# Patient Record
Sex: Female | Born: 1940
Health system: Southern US, Community
[De-identification: ages and names within clinical notes are randomized; demographics above are authoritative.]

## PROBLEM LIST (undated history)

## (undated) DIAGNOSIS — E785 Hyperlipidemia, unspecified: Secondary | ICD-10-CM

## (undated) DIAGNOSIS — J8410 Pulmonary fibrosis, unspecified: Secondary | ICD-10-CM

## (undated) DIAGNOSIS — C3490 Malignant neoplasm of unspecified part of unspecified bronchus or lung: Secondary | ICD-10-CM

## (undated) DIAGNOSIS — F32A Depression, unspecified: Secondary | ICD-10-CM

## (undated) DIAGNOSIS — F419 Anxiety disorder, unspecified: Secondary | ICD-10-CM

## (undated) DIAGNOSIS — R911 Solitary pulmonary nodule: Secondary | ICD-10-CM

## (undated) DIAGNOSIS — R519 Headache, unspecified: Secondary | ICD-10-CM

## (undated) DIAGNOSIS — J302 Other seasonal allergic rhinitis: Secondary | ICD-10-CM

## (undated) DIAGNOSIS — M1990 Unspecified osteoarthritis, unspecified site: Secondary | ICD-10-CM

## (undated) DIAGNOSIS — F329 Major depressive disorder, single episode, unspecified: Secondary | ICD-10-CM

## (undated) DIAGNOSIS — M545 Low back pain: Secondary | ICD-10-CM

## (undated) DIAGNOSIS — G8929 Other chronic pain: Secondary | ICD-10-CM

## (undated) DIAGNOSIS — Z8709 Personal history of other diseases of the respiratory system: Secondary | ICD-10-CM

## (undated) DIAGNOSIS — R51 Headache: Secondary | ICD-10-CM

## (undated) DIAGNOSIS — I10 Essential (primary) hypertension: Secondary | ICD-10-CM

## (undated) DIAGNOSIS — T8859XA Other complications of anesthesia, initial encounter: Secondary | ICD-10-CM

## (undated) DIAGNOSIS — T4145XA Adverse effect of unspecified anesthetic, initial encounter: Secondary | ICD-10-CM

## (undated) HISTORY — PX: JOINT REPLACEMENT: SHX530

## (undated) HISTORY — PX: BACK SURGERY: SHX140

## (undated) HISTORY — PX: EYE SURGERY: SHX253

## (undated) HISTORY — PX: TUBAL LIGATION: SHX77

## (undated) HISTORY — PX: CATARACT EXTRACTION W/ INTRAOCULAR LENS  IMPLANT, BILATERAL: SHX1307

## (undated) HISTORY — PX: TONSILLECTOMY: SUR1361

---

## 1998-09-03 ENCOUNTER — Other Ambulatory Visit: Admission: RE | Admit: 1998-09-03 | Discharge: 1998-09-03 | Payer: Self-pay | Admitting: *Deleted

## 2001-05-24 ENCOUNTER — Other Ambulatory Visit: Admission: RE | Admit: 2001-05-24 | Discharge: 2001-05-24 | Payer: Self-pay | Admitting: *Deleted

## 2001-06-01 ENCOUNTER — Encounter (INDEPENDENT_AMBULATORY_CARE_PROVIDER_SITE_OTHER): Payer: Self-pay | Admitting: Specialist

## 2001-06-01 ENCOUNTER — Other Ambulatory Visit: Admission: RE | Admit: 2001-06-01 | Discharge: 2001-06-01 | Payer: Self-pay | Admitting: Obstetrics and Gynecology

## 2002-07-07 ENCOUNTER — Other Ambulatory Visit: Admission: RE | Admit: 2002-07-07 | Discharge: 2002-07-07 | Payer: Self-pay | Admitting: Obstetrics and Gynecology

## 2004-02-07 ENCOUNTER — Encounter: Admission: RE | Admit: 2004-02-07 | Discharge: 2004-02-07 | Payer: Self-pay | Admitting: Family Medicine

## 2004-02-07 ENCOUNTER — Other Ambulatory Visit: Admission: RE | Admit: 2004-02-07 | Discharge: 2004-02-07 | Payer: Self-pay | Admitting: Family Medicine

## 2006-05-07 ENCOUNTER — Other Ambulatory Visit: Admission: RE | Admit: 2006-05-07 | Discharge: 2006-05-07 | Payer: Self-pay | Admitting: Family Medicine

## 2007-05-30 ENCOUNTER — Encounter: Admission: RE | Admit: 2007-05-30 | Discharge: 2007-05-30 | Payer: Self-pay | Admitting: Family Medicine

## 2007-12-13 ENCOUNTER — Other Ambulatory Visit: Admission: RE | Admit: 2007-12-13 | Discharge: 2007-12-13 | Payer: Self-pay | Admitting: Family Medicine

## 2012-09-22 ENCOUNTER — Other Ambulatory Visit (HOSPITAL_COMMUNITY): Payer: Self-pay | Admitting: Orthopaedic Surgery

## 2012-10-24 ENCOUNTER — Encounter (HOSPITAL_COMMUNITY): Payer: Self-pay | Admitting: Pharmacy Technician

## 2012-10-26 ENCOUNTER — Inpatient Hospital Stay (HOSPITAL_COMMUNITY): Admission: RE | Admit: 2012-10-26 | Payer: Self-pay | Source: Ambulatory Visit

## 2012-10-26 ENCOUNTER — Other Ambulatory Visit (HOSPITAL_COMMUNITY): Payer: Self-pay | Admitting: *Deleted

## 2012-10-31 ENCOUNTER — Ambulatory Visit (HOSPITAL_COMMUNITY)
Admission: RE | Admit: 2012-10-31 | Discharge: 2012-10-31 | Disposition: A | Discharge status: Home / Self Care | Payer: Medicare Other | Source: Ambulatory Visit | Attending: Orthopaedic Surgery | Admitting: Orthopaedic Surgery

## 2012-10-31 ENCOUNTER — Encounter (HOSPITAL_COMMUNITY): Payer: Self-pay

## 2012-10-31 ENCOUNTER — Encounter (HOSPITAL_COMMUNITY)
Admission: RE | Admit: 2012-10-31 | Discharge: 2012-10-31 | Disposition: A | Discharge status: Home / Self Care | Payer: Medicare Other | Source: Ambulatory Visit | Attending: Orthopaedic Surgery | Admitting: Orthopaedic Surgery

## 2012-10-31 DIAGNOSIS — I7090 Unspecified atherosclerosis: Secondary | ICD-10-CM | POA: Insufficient documentation

## 2012-10-31 DIAGNOSIS — B370 Candidal stomatitis: Secondary | ICD-10-CM | POA: Diagnosis not present

## 2012-10-31 DIAGNOSIS — R578 Other shock: Secondary | ICD-10-CM | POA: Diagnosis not present

## 2012-10-31 DIAGNOSIS — F17200 Nicotine dependence, unspecified, uncomplicated: Secondary | ICD-10-CM | POA: Insufficient documentation

## 2012-10-31 DIAGNOSIS — R918 Other nonspecific abnormal finding of lung field: Secondary | ICD-10-CM | POA: Diagnosis not present

## 2012-10-31 DIAGNOSIS — J9600 Acute respiratory failure, unspecified whether with hypoxia or hypercapnia: Secondary | ICD-10-CM | POA: Diagnosis not present

## 2012-10-31 DIAGNOSIS — Z96659 Presence of unspecified artificial knee joint: Secondary | ICD-10-CM | POA: Diagnosis not present

## 2012-10-31 DIAGNOSIS — R911 Solitary pulmonary nodule: Secondary | ICD-10-CM | POA: Diagnosis present

## 2012-10-31 DIAGNOSIS — I509 Heart failure, unspecified: Secondary | ICD-10-CM | POA: Diagnosis not present

## 2012-10-31 DIAGNOSIS — J988 Other specified respiratory disorders: Secondary | ICD-10-CM | POA: Diagnosis not present

## 2012-10-31 DIAGNOSIS — J90 Pleural effusion, not elsewhere classified: Secondary | ICD-10-CM | POA: Diagnosis not present

## 2012-10-31 DIAGNOSIS — R0902 Hypoxemia: Secondary | ICD-10-CM | POA: Diagnosis not present

## 2012-10-31 DIAGNOSIS — Z4682 Encounter for fitting and adjustment of non-vascular catheter: Secondary | ICD-10-CM | POA: Diagnosis not present

## 2012-10-31 DIAGNOSIS — Z880 Allergy status to penicillin: Secondary | ICD-10-CM | POA: Diagnosis not present

## 2012-10-31 DIAGNOSIS — E876 Hypokalemia: Secondary | ICD-10-CM | POA: Diagnosis not present

## 2012-10-31 DIAGNOSIS — Z96649 Presence of unspecified artificial hip joint: Secondary | ICD-10-CM | POA: Diagnosis not present

## 2012-10-31 DIAGNOSIS — I5031 Acute diastolic (congestive) heart failure: Secondary | ICD-10-CM | POA: Diagnosis not present

## 2012-10-31 DIAGNOSIS — R0602 Shortness of breath: Secondary | ICD-10-CM | POA: Diagnosis not present

## 2012-10-31 DIAGNOSIS — F29 Unspecified psychosis not due to a substance or known physiological condition: Secondary | ICD-10-CM | POA: Diagnosis not present

## 2012-10-31 DIAGNOSIS — D72829 Elevated white blood cell count, unspecified: Secondary | ICD-10-CM | POA: Diagnosis not present

## 2012-10-31 DIAGNOSIS — R579 Shock, unspecified: Secondary | ICD-10-CM | POA: Diagnosis not present

## 2012-10-31 DIAGNOSIS — Z5189 Encounter for other specified aftercare: Secondary | ICD-10-CM | POA: Diagnosis not present

## 2012-10-31 DIAGNOSIS — M1610 Unilateral primary osteoarthritis, unspecified hip: Secondary | ICD-10-CM | POA: Diagnosis not present

## 2012-10-31 DIAGNOSIS — M25559 Pain in unspecified hip: Secondary | ICD-10-CM | POA: Diagnosis not present

## 2012-10-31 DIAGNOSIS — I517 Cardiomegaly: Secondary | ICD-10-CM | POA: Insufficient documentation

## 2012-10-31 DIAGNOSIS — Z01818 Encounter for other preprocedural examination: Secondary | ICD-10-CM | POA: Diagnosis not present

## 2012-10-31 DIAGNOSIS — J811 Chronic pulmonary edema: Secondary | ICD-10-CM | POA: Diagnosis not present

## 2012-10-31 DIAGNOSIS — Z79899 Other long term (current) drug therapy: Secondary | ICD-10-CM | POA: Diagnosis not present

## 2012-10-31 DIAGNOSIS — J189 Pneumonia, unspecified organism: Secondary | ICD-10-CM | POA: Diagnosis not present

## 2012-10-31 DIAGNOSIS — Z9889 Other specified postprocedural states: Secondary | ICD-10-CM | POA: Diagnosis not present

## 2012-10-31 DIAGNOSIS — M169 Osteoarthritis of hip, unspecified: Secondary | ICD-10-CM | POA: Diagnosis present

## 2012-10-31 DIAGNOSIS — I2510 Atherosclerotic heart disease of native coronary artery without angina pectoris: Secondary | ICD-10-CM | POA: Diagnosis present

## 2012-10-31 DIAGNOSIS — R404 Transient alteration of awareness: Secondary | ICD-10-CM | POA: Diagnosis not present

## 2012-10-31 DIAGNOSIS — Z471 Aftercare following joint replacement surgery: Secondary | ICD-10-CM | POA: Diagnosis not present

## 2012-10-31 DIAGNOSIS — E871 Hypo-osmolality and hyponatremia: Secondary | ICD-10-CM | POA: Diagnosis not present

## 2012-10-31 DIAGNOSIS — I959 Hypotension, unspecified: Secondary | ICD-10-CM | POA: Diagnosis not present

## 2012-10-31 DIAGNOSIS — D62 Acute posthemorrhagic anemia: Secondary | ICD-10-CM | POA: Diagnosis not present

## 2012-10-31 DIAGNOSIS — R509 Fever, unspecified: Secondary | ICD-10-CM | POA: Diagnosis not present

## 2012-10-31 DIAGNOSIS — R0609 Other forms of dyspnea: Secondary | ICD-10-CM | POA: Diagnosis not present

## 2012-10-31 DIAGNOSIS — I10 Essential (primary) hypertension: Secondary | ICD-10-CM | POA: Diagnosis present

## 2012-10-31 HISTORY — DX: Unspecified osteoarthritis, unspecified site: M19.90

## 2012-10-31 HISTORY — DX: Essential (primary) hypertension: I10

## 2012-10-31 LAB — URINALYSIS, ROUTINE W REFLEX MICROSCOPIC
Glucose, UA: NEGATIVE mg/dL
Hgb urine dipstick: NEGATIVE
Nitrite: NEGATIVE
Specific Gravity, Urine: 1.009 (ref 1.005–1.030)
Urobilinogen, UA: 0.2 mg/dL (ref 0.0–1.0)
pH: 6.5 (ref 5.0–8.0)

## 2012-10-31 LAB — BASIC METABOLIC PANEL
CO2: 29 mEq/L (ref 19–32)
Calcium: 10.6 mg/dL — ABNORMAL HIGH (ref 8.4–10.5)
Chloride: 99 mEq/L (ref 96–112)
GFR calc Af Amer: >90 mL/min (ref >90)
GFR calc non Af Amer: 87 mL/min — ABNORMAL LOW (ref >90)
Glucose, Bld: 90 mg/dL (ref 70–99)
Potassium: 4.3 mEq/L (ref 3.5–5.1)

## 2012-10-31 LAB — CBC
Hemoglobin: 13.6 g/dL (ref 12.0–15.0)
MCH: 32.3 pg (ref 26.0–34.0)
MCHC: 34.3 g/dL (ref 30.0–36.0)
WBC: 8.8 10*3/uL (ref 4.0–10.5)

## 2012-10-31 LAB — SURGICAL PCR SCREEN: Staphylococcus aureus: NEGATIVE

## 2012-10-31 LAB — PROTIME-INR: Prothrombin Time: 12.6 seconds (ref 11.6–15.2)

## 2012-10-31 LAB — ABO/RH: ABO/RH(D): O NEG

## 2012-10-31 NOTE — Patient Instructions (Signed)
20      Your procedure is scheduled on:  Thursday 11/03/2012 at 0730 am   Report to [facility] at 0530 AM.  Call this number if you have problems the morning of surgery: [phone]   Remember:   Do not eat food or drink liquids after midnight!  Take these medicines the morning of surgery with A SIP OF WATER:  Lipitor   Do not bring valuables to the hospital.  .  Leave suitcase in the car. After surgery it may be brought to your room.  For patients admitted to the hospital, checkout time is 11:00 AM the day of              Discharge.    Special Instructions: See [facility] Preparing  For Surgery Instruction Sheet. Do not wear jewelry, lotions powders, perfumes. Women do not shave legs or underarms for 12 hours before showers. Contacts, partial plates,              or dentures may not be worn into surgery.                          Patients discharged the day of surgery will not be allowed to drive home.  If going home the same day of surgery, must have someone stay with you first 24 hrs.at home and arrange for someone to drive you home from the Hospital.              YOUR DRIVER IS: daughter-Aleksis   Please read over the following fact sheets that you were given: MRSA INFORMATION,INCENTIVE SPIROMETRY SHEET, SLEEP APNEA SHEET,BLOOD TRANSFUSION SHEET                            Telford Nab.Shaheim Mahar,RN,BSN     [phone]

## 2012-11-03 ENCOUNTER — Inpatient Hospital Stay (HOSPITAL_COMMUNITY): Payer: Medicare Other

## 2012-11-03 ENCOUNTER — Encounter (HOSPITAL_COMMUNITY)
Admission: RE | Disposition: A | Discharge status: Rehabilitation Facility | Payer: Self-pay | Source: Ambulatory Visit | Attending: Orthopaedic Surgery

## 2012-11-03 ENCOUNTER — Inpatient Hospital Stay (HOSPITAL_COMMUNITY)
Admission: RE | Admit: 2012-11-03 | Discharge: 2012-11-16 | DRG: 469 | Disposition: A | Discharge status: Rehabilitation Facility | Payer: Medicare Other | Source: Ambulatory Visit | Attending: Orthopaedic Surgery | Admitting: Orthopaedic Surgery

## 2012-11-03 ENCOUNTER — Encounter (HOSPITAL_COMMUNITY): Payer: Self-pay | Admitting: Anesthesiology

## 2012-11-03 ENCOUNTER — Encounter (HOSPITAL_COMMUNITY): Payer: Self-pay | Admitting: *Deleted

## 2012-11-03 ENCOUNTER — Inpatient Hospital Stay (HOSPITAL_COMMUNITY): Payer: Medicare Other | Admitting: Anesthesiology

## 2012-11-03 DIAGNOSIS — I959 Hypotension, unspecified: Secondary | ICD-10-CM

## 2012-11-03 DIAGNOSIS — R579 Shock, unspecified: Secondary | ICD-10-CM | POA: Diagnosis not present

## 2012-11-03 DIAGNOSIS — R911 Solitary pulmonary nodule: Secondary | ICD-10-CM | POA: Diagnosis present

## 2012-11-03 DIAGNOSIS — E876 Hypokalemia: Secondary | ICD-10-CM | POA: Diagnosis not present

## 2012-11-03 DIAGNOSIS — F17200 Nicotine dependence, unspecified, uncomplicated: Secondary | ICD-10-CM | POA: Diagnosis present

## 2012-11-03 DIAGNOSIS — J9600 Acute respiratory failure, unspecified whether with hypoxia or hypercapnia: Secondary | ICD-10-CM | POA: Diagnosis not present

## 2012-11-03 DIAGNOSIS — R410 Disorientation, unspecified: Secondary | ICD-10-CM

## 2012-11-03 DIAGNOSIS — R404 Transient alteration of awareness: Secondary | ICD-10-CM | POA: Diagnosis not present

## 2012-11-03 DIAGNOSIS — Z79899 Other long term (current) drug therapy: Secondary | ICD-10-CM

## 2012-11-03 DIAGNOSIS — B370 Candidal stomatitis: Secondary | ICD-10-CM | POA: Diagnosis not present

## 2012-11-03 DIAGNOSIS — M169 Osteoarthritis of hip, unspecified: Secondary | ICD-10-CM

## 2012-11-03 DIAGNOSIS — J189 Pneumonia, unspecified organism: Secondary | ICD-10-CM

## 2012-11-03 DIAGNOSIS — E871 Hypo-osmolality and hyponatremia: Secondary | ICD-10-CM | POA: Diagnosis not present

## 2012-11-03 DIAGNOSIS — I2510 Atherosclerotic heart disease of native coronary artery without angina pectoris: Secondary | ICD-10-CM | POA: Diagnosis present

## 2012-11-03 DIAGNOSIS — D649 Anemia, unspecified: Secondary | ICD-10-CM

## 2012-11-03 DIAGNOSIS — I517 Cardiomegaly: Secondary | ICD-10-CM | POA: Diagnosis present

## 2012-11-03 DIAGNOSIS — I10 Essential (primary) hypertension: Secondary | ICD-10-CM | POA: Diagnosis present

## 2012-11-03 DIAGNOSIS — Z96649 Presence of unspecified artificial hip joint: Secondary | ICD-10-CM

## 2012-11-03 DIAGNOSIS — Z880 Allergy status to penicillin: Secondary | ICD-10-CM

## 2012-11-03 DIAGNOSIS — R0902 Hypoxemia: Secondary | ICD-10-CM | POA: Diagnosis present

## 2012-11-03 DIAGNOSIS — I509 Heart failure, unspecified: Secondary | ICD-10-CM

## 2012-11-03 DIAGNOSIS — R0602 Shortness of breath: Secondary | ICD-10-CM

## 2012-11-03 DIAGNOSIS — I5031 Acute diastolic (congestive) heart failure: Secondary | ICD-10-CM

## 2012-11-03 DIAGNOSIS — D62 Acute posthemorrhagic anemia: Secondary | ICD-10-CM | POA: Diagnosis not present

## 2012-11-03 DIAGNOSIS — M1610 Unilateral primary osteoarthritis, unspecified hip: Principal | ICD-10-CM | POA: Diagnosis present

## 2012-11-03 DIAGNOSIS — R509 Fever, unspecified: Secondary | ICD-10-CM

## 2012-11-03 DIAGNOSIS — D72829 Elevated white blood cell count, unspecified: Secondary | ICD-10-CM | POA: Diagnosis not present

## 2012-11-03 HISTORY — DX: Hyperlipidemia, unspecified: E78.5

## 2012-11-03 HISTORY — PX: TOTAL HIP ARTHROPLASTY: SHX124

## 2012-11-03 HISTORY — DX: Solitary pulmonary nodule: R91.1

## 2012-11-03 LAB — TYPE AND SCREEN

## 2012-11-03 SURGERY — ARTHROPLASTY, HIP, TOTAL, ANTERIOR APPROACH
Anesthesia: General | Site: Hip | Laterality: Right | Wound class: Clean

## 2012-11-03 MED ORDER — PHENOL 1.4 % MT LIQD: 1.0000 | OROMUCOSAL | Status: DC | PRN

## 2012-11-03 MED ORDER — EPHEDRINE SULFATE 50 MG/ML IJ SOLN
INTRAMUSCULAR | Status: DC | PRN
  Administered 2012-11-03: 5 mg via INTRAVENOUS

## 2012-11-03 MED ORDER — KETAMINE HCL 10 MG/ML IJ SOLN
INTRAMUSCULAR | Status: DC | PRN
  Administered 2012-11-03: 50 mg via INTRAVENOUS
  Administered 2012-11-03 (×5): 2 mg via INTRAVENOUS

## 2012-11-03 MED ORDER — ROCURONIUM BROMIDE 100 MG/10ML IV SOLN
INTRAVENOUS | Status: DC | PRN
  Administered 2012-11-03: 50 mg via INTRAVENOUS

## 2012-11-03 MED ORDER — CLINDAMYCIN PHOSPHATE 600 MG/50ML IV SOLN
600.0000 mg | Freq: Four times a day (QID) | INTRAVENOUS | Status: AC
  Administered 2012-11-03 (×2): 600 mg via INTRAVENOUS
  Filled 2012-11-03 (×2): qty 50

## 2012-11-03 MED ORDER — CARBOXYMETHYLCELLULOSE SODIUM 0.5 % OP SOLN: 1.0000 [drp] | Freq: Every day | OPHTHALMIC | Status: DC | PRN

## 2012-11-03 MED ORDER — ACETAMINOPHEN 10 MG/ML IV SOLN
INTRAVENOUS | Status: DC | PRN
  Administered 2012-11-03: 1000 mg via INTRAVENOUS

## 2012-11-03 MED ORDER — ALBUTEROL SULFATE HFA 108 (90 BASE) MCG/ACT IN AERS
INHALATION_SPRAY | RESPIRATORY_TRACT | Status: DC | PRN
  Administered 2012-11-03: 5 via RESPIRATORY_TRACT

## 2012-11-03 MED ORDER — METOCLOPRAMIDE HCL 5 MG/ML IJ SOLN
INTRAMUSCULAR | Status: DC | PRN
  Administered 2012-11-03: 10 mg via INTRAVENOUS

## 2012-11-03 MED ORDER — SODIUM CHLORIDE 0.9 % IV SOLN
INTRAVENOUS | Status: DC
  Administered 2012-11-03 – 2012-11-04 (×2): via INTRAVENOUS

## 2012-11-03 MED ORDER — LACTATED RINGERS IV SOLN
INTRAVENOUS | Status: DC | PRN
Start: 2012-11-03 — End: 2012-11-03
  Administered 2012-11-03 (×3): via INTRAVENOUS

## 2012-11-03 MED ORDER — CLINDAMYCIN PHOSPHATE 900 MG/50ML IV SOLN
900.0000 mg | INTRAVENOUS | Status: AC
  Administered 2012-11-03: 900 mg via INTRAVENOUS

## 2012-11-03 MED ORDER — ACETAMINOPHEN 325 MG PO TABS
650.0000 mg | ORAL_TABLET | Freq: Four times a day (QID) | ORAL | Status: DC | PRN
  Administered 2012-11-07 – 2012-11-09 (×2): 650 mg via ORAL
  Filled 2012-11-03 (×2): qty 2

## 2012-11-03 MED ORDER — ADULT MULTIVITAMIN W/MINERALS CH
1.0000 | ORAL_TABLET | Freq: Every day | ORAL | Status: DC
  Administered 2012-11-03 – 2012-11-07 (×5): 1 via ORAL
  Filled 2012-11-03 (×7): qty 1

## 2012-11-03 MED ORDER — NEOSTIGMINE METHYLSULFATE 1 MG/ML IJ SOLN
INTRAMUSCULAR | Status: DC | PRN
  Administered 2012-11-03: 4 mg via INTRAVENOUS

## 2012-11-03 MED ORDER — PROMETHAZINE HCL 25 MG/ML IJ SOLN: 6.2500 mg | INTRAMUSCULAR | Status: DC | PRN

## 2012-11-03 MED ORDER — DIPHENHYDRAMINE HCL 12.5 MG/5ML PO ELIX: 12.5000 mg | ORAL_SOLUTION | ORAL | Status: DC | PRN

## 2012-11-03 MED ORDER — DEXAMETHASONE SODIUM PHOSPHATE 4 MG/ML IJ SOLN
INTRAMUSCULAR | Status: DC | PRN
  Administered 2012-11-03: 10 mg via INTRAVENOUS

## 2012-11-03 MED ORDER — ASPIRIN EC 325 MG PO TBEC
325.0000 mg | DELAYED_RELEASE_TABLET | Freq: Two times a day (BID) | ORAL | Status: DC
  Administered 2012-11-03 – 2012-11-06 (×5): 325 mg via ORAL
  Filled 2012-11-03 (×11): qty 1

## 2012-11-03 MED ORDER — AMLODIPINE BESYLATE 5 MG PO TABS
5.0000 mg | ORAL_TABLET | Freq: Every day | ORAL | Status: DC
  Administered 2012-11-04 – 2012-11-07 (×4): 5 mg via ORAL
  Filled 2012-11-03 (×6): qty 1

## 2012-11-03 MED ORDER — ZOLPIDEM TARTRATE 5 MG PO TABS
5.0000 mg | ORAL_TABLET | Freq: Every evening | ORAL | Status: DC | PRN
  Filled 2012-11-03: qty 1

## 2012-11-03 MED ORDER — METOCLOPRAMIDE HCL 10 MG PO TABS: 5.0000 mg | ORAL_TABLET | Freq: Three times a day (TID) | ORAL | Status: DC | PRN

## 2012-11-03 MED ORDER — HYDROCHLOROTHIAZIDE 25 MG PO TABS
25.0000 mg | ORAL_TABLET | Freq: Every morning | ORAL | Status: DC
  Administered 2012-11-03 – 2012-11-06 (×4): 25 mg via ORAL
  Filled 2012-11-03 (×4): qty 1

## 2012-11-03 MED ORDER — PROPOFOL 10 MG/ML IV BOLUS
INTRAVENOUS | Status: DC | PRN
  Administered 2012-11-03: 120 mg via INTRAVENOUS

## 2012-11-03 MED ORDER — HYDROMORPHONE HCL PF 1 MG/ML IJ SOLN
INTRAMUSCULAR | Status: DC | PRN
  Administered 2012-11-03 (×2): 1 mg via INTRAVENOUS

## 2012-11-03 MED ORDER — ALUM & MAG HYDROXIDE-SIMETH 200-200-20 MG/5ML PO SUSP: 30.0000 mL | ORAL | Status: DC | PRN

## 2012-11-03 MED ORDER — MENTHOL 3 MG MT LOZG: 1.0000 | LOZENGE | OROMUCOSAL | Status: DC | PRN

## 2012-11-03 MED ORDER — FENTANYL CITRATE 0.05 MG/ML IJ SOLN
INTRAMUSCULAR | Status: DC | PRN
  Administered 2012-11-03 (×4): 50 ug via INTRAVENOUS

## 2012-11-03 MED ORDER — OXYCODONE HCL ER 20 MG PO T12A
20.0000 mg | EXTENDED_RELEASE_TABLET | Freq: Two times a day (BID) | ORAL | Status: DC
  Administered 2012-11-03 – 2012-11-04 (×3): 20 mg via ORAL
  Filled 2012-11-03 (×4): qty 1

## 2012-11-03 MED ORDER — BENAZEPRIL HCL 20 MG PO TABS
20.0000 mg | ORAL_TABLET | Freq: Every day | ORAL | Status: DC
  Administered 2012-11-04 – 2012-11-07 (×4): 20 mg via ORAL
  Filled 2012-11-03 (×6): qty 1

## 2012-11-03 MED ORDER — OXYCODONE HCL 5 MG PO TABS
5.0000 mg | ORAL_TABLET | ORAL | Status: DC | PRN
  Administered 2012-11-03 – 2012-11-04 (×5): 5 mg via ORAL
  Administered 2012-11-04: 10 mg via ORAL
  Administered 2012-11-04 (×2): 5 mg via ORAL
  Administered 2012-11-05: 10 mg via ORAL
  Filled 2012-11-03 (×4): qty 1
  Filled 2012-11-03: qty 2
  Filled 2012-11-03 (×4): qty 1

## 2012-11-03 MED ORDER — AMLODIPINE BESYLATE 5 MG PO TABS
5.0000 mg | ORAL_TABLET | ORAL | Status: AC
  Administered 2012-11-03: 5 mg via ORAL
  Filled 2012-11-03: qty 1

## 2012-11-03 MED ORDER — 0.9 % SODIUM CHLORIDE (POUR BTL) OPTIME
TOPICAL | Status: DC | PRN
  Administered 2012-11-03: 1000 mL

## 2012-11-03 MED ORDER — GLYCOPYRROLATE 0.2 MG/ML IJ SOLN
INTRAMUSCULAR | Status: DC | PRN
  Administered 2012-11-03: 0.4 mg via INTRAVENOUS

## 2012-11-03 MED ORDER — ONDANSETRON HCL 4 MG/2ML IJ SOLN: 4.0000 mg | Freq: Four times a day (QID) | INTRAMUSCULAR | Status: DC | PRN

## 2012-11-03 MED ORDER — MIDAZOLAM HCL 5 MG/5ML IJ SOLN
INTRAMUSCULAR | Status: DC | PRN
  Administered 2012-11-03: 2 mg via INTRAVENOUS

## 2012-11-03 MED ORDER — HYDROMORPHONE HCL PF 1 MG/ML IJ SOLN
1.0000 mg | INTRAMUSCULAR | Status: DC | PRN
  Administered 2012-11-04 – 2012-11-05 (×3): 1 mg via INTRAVENOUS
  Filled 2012-11-03 (×3): qty 1

## 2012-11-03 MED ORDER — ONDANSETRON HCL 4 MG/2ML IJ SOLN
INTRAMUSCULAR | Status: DC | PRN
  Administered 2012-11-03: 4 mg via INTRAVENOUS

## 2012-11-03 MED ORDER — AMLODIPINE BESY-BENAZEPRIL HCL 5-20 MG PO CAPS: 1.0000 | ORAL_CAPSULE | Freq: Every morning | ORAL | Status: DC

## 2012-11-03 MED ORDER — DOCUSATE SODIUM 100 MG PO CAPS
100.0000 mg | ORAL_CAPSULE | Freq: Two times a day (BID) | ORAL | Status: DC
  Administered 2012-11-03 – 2012-11-07 (×9): 100 mg via ORAL
  Filled 2012-11-03 (×3): qty 1

## 2012-11-03 MED ORDER — ACETAMINOPHEN 650 MG RE SUPP
650.0000 mg | Freq: Four times a day (QID) | RECTAL | Status: DC | PRN
  Administered 2012-11-08: 650 mg via RECTAL
  Filled 2012-11-03: qty 1

## 2012-11-03 MED ORDER — KETOROLAC TROMETHAMINE 15 MG/ML IJ SOLN
7.5000 mg | Freq: Four times a day (QID) | INTRAMUSCULAR | Status: AC
  Administered 2012-11-03 – 2012-11-04 (×4): 7.5 mg via INTRAVENOUS
  Filled 2012-11-03 (×4): qty 1

## 2012-11-03 MED ORDER — PHENYLEPHRINE HCL 10 MG/ML IJ SOLN
INTRAMUSCULAR | Status: DC | PRN
  Administered 2012-11-03: 120 ug via INTRAVENOUS
  Administered 2012-11-03: 80 ug via INTRAVENOUS

## 2012-11-03 MED ORDER — METOCLOPRAMIDE HCL 5 MG/ML IJ SOLN: 5.0000 mg | Freq: Three times a day (TID) | INTRAMUSCULAR | Status: DC | PRN

## 2012-11-03 MED ORDER — FERROUS SULFATE 325 (65 FE) MG PO TABS
325.0000 mg | ORAL_TABLET | Freq: Three times a day (TID) | ORAL | Status: DC
  Administered 2012-11-03 – 2012-11-07 (×13): 325 mg via ORAL
  Filled 2012-11-03 (×21): qty 1

## 2012-11-03 MED ORDER — ATORVASTATIN CALCIUM 20 MG PO TABS
20.0000 mg | ORAL_TABLET | Freq: Every day | ORAL | Status: DC
  Administered 2012-11-04 – 2012-11-07 (×4): 20 mg via ORAL
  Filled 2012-11-03 (×5): qty 1

## 2012-11-03 MED ORDER — ONDANSETRON HCL 4 MG PO TABS: 4.0000 mg | ORAL_TABLET | Freq: Four times a day (QID) | ORAL | Status: DC | PRN

## 2012-11-03 MED ORDER — HYDROMORPHONE HCL PF 1 MG/ML IJ SOLN
0.2500 mg | INTRAMUSCULAR | Status: DC | PRN
  Administered 2012-11-03 (×2): 0.25 mg via INTRAVENOUS

## 2012-11-03 MED ORDER — POLYVINYL ALCOHOL 1.4 % OP SOLN
1.0000 [drp] | OPHTHALMIC | Status: DC | PRN
  Filled 2012-11-03: qty 15

## 2012-11-03 MED ORDER — METHOCARBAMOL 500 MG PO TABS
500.0000 mg | ORAL_TABLET | Freq: Four times a day (QID) | ORAL | Status: DC | PRN
  Administered 2012-11-03 – 2012-11-14 (×7): 500 mg via ORAL
  Filled 2012-11-03 (×7): qty 1

## 2012-11-03 MED ORDER — METHOCARBAMOL 100 MG/ML IJ SOLN
500.0000 mg | Freq: Four times a day (QID) | INTRAVENOUS | Status: DC | PRN
  Administered 2012-11-03: 500 mg via INTRAVENOUS
  Filled 2012-11-03: qty 5

## 2012-11-03 MED ORDER — CO Q 10 100 MG PO CAPS: 300.0000 mg | ORAL_CAPSULE | Freq: Every day | ORAL | Status: DC

## 2012-11-03 SURGICAL SUPPLY — 35 items
BAG SPEC THK2 15X12 ZIP CLS (MISCELLANEOUS) ×2
BAG ZIPLOCK 12X15 (MISCELLANEOUS) ×4 IMPLANT
BLADE SAW SGTL 18X1.27X75 (BLADE) ×2 IMPLANT
CLOTH BEACON ORANGE TIMEOUT ST (SAFETY) ×2 IMPLANT
DRAPE C-ARM 42X72 X-RAY (DRAPES) ×2 IMPLANT
DRAPE STERI IOBAN 125X83 (DRAPES) ×2 IMPLANT
DRAPE U-SHAPE 47X51 STRL (DRAPES) ×6 IMPLANT
DRSG MEPILEX BORDER 4X8 (GAUZE/BANDAGES/DRESSINGS) ×2 IMPLANT
DURAPREP 26ML APPLICATOR (WOUND CARE) ×2 IMPLANT
ELECT BLADE TIP CTD 4 INCH (ELECTRODE) ×2 IMPLANT
ELECT REM PT RETURN 9FT ADLT (ELECTROSURGICAL) ×2
ELECTRODE REM PT RTRN 9FT ADLT (ELECTROSURGICAL) ×1 IMPLANT
FACESHIELD LNG OPTICON STERILE (SAFETY) ×9 IMPLANT
GAUZE XEROFORM 1X8 LF (GAUZE/BANDAGES/DRESSINGS) ×2 IMPLANT
GLOVE BIO SURGEON STRL SZ7 (GLOVE) ×1 IMPLANT
GLOVE BIO SURGEON STRL SZ7.5 (GLOVE) ×2 IMPLANT
GLOVE BIOGEL PI IND STRL 7.5 (GLOVE) IMPLANT
GLOVE BIOGEL PI IND STRL 8 (GLOVE) ×1 IMPLANT
GLOVE BIOGEL PI INDICATOR 7.5 (GLOVE)
GLOVE BIOGEL PI INDICATOR 8 (GLOVE) ×1
GLOVE ECLIPSE 7.0 STRL STRAW (GLOVE) ×2 IMPLANT
GOWN STRL REIN XL XLG (GOWN DISPOSABLE) ×5 IMPLANT
HOLDER FOLEY CATH W/STRAP (MISCELLANEOUS) ×1 IMPLANT
KIT BASIN OR (CUSTOM PROCEDURE TRAY) ×2 IMPLANT
PACK TOTAL JOINT (CUSTOM PROCEDURE TRAY) ×2 IMPLANT
PADDING CAST COTTON 6X4 STRL (CAST SUPPLIES) ×2 IMPLANT
STAPLER VISISTAT 35W (STAPLE) IMPLANT
SUT ETHIBOND NAB CT1 #1 30IN (SUTURE) ×3 IMPLANT
SUT VIC AB 1 CT1 36 (SUTURE) ×3 IMPLANT
SUT VIC AB 2-0 CT1 27 (SUTURE) ×2
SUT VIC AB 2-0 CT1 TAPERPNT 27 (SUTURE) ×2 IMPLANT
SUT VLOC 180 0 24IN GS25 (SUTURE) ×1 IMPLANT
TOWEL OR 17X26 10 PK STRL BLUE (TOWEL DISPOSABLE) ×4 IMPLANT
TOWEL OR NON WOVEN STRL DISP B (DISPOSABLE) ×2 IMPLANT
TRAY FOLEY CATH 14FRSI W/METER (CATHETERS) ×2 IMPLANT

## 2012-11-03 NOTE — Progress Notes (Signed)
Utilization review completed.  

## 2012-11-03 NOTE — Progress Notes (Signed)
DR. ROSE IN TO SEE PATIENT- O.K. TO GO TO FLOOR

## 2012-11-03 NOTE — Progress Notes (Signed)
Dr. Okey Dupre in to see patient- saw EKG  monitor

## 2012-11-03 NOTE — Brief Op Note (Signed)
11/03/2012  9:11 AM  PATIENT:  [name] [name]  71 y.o. female  PRE-OPERATIVE DIAGNOSIS:  Severe osteoarthritis right hip  POST-OPERATIVE DIAGNOSIS:  Severe osteoarthritis right hip  PROCEDURE:  Procedure(s) (LRB) with comments: TOTAL HIP ARTHROPLASTY ANTERIOR APPROACH (Right) - Right Total Hip Arthroplasty, Anterior Approach (C-Arm)  SURGEON:  Surgeon(s) and Role:    * Kathryne Hitch, MD - Primary  PHYSICIAN ASSISTANT:   ASSISTANTS: none   ANESTHESIA:   general  EBL:  Total I/O In: 1000 [I.V.:1000] Out: 505 [Urine:130; Blood:375]  BLOOD ADMINISTERED:none  DRAINS: none   LOCAL MEDICATIONS USED:  NONE  SPECIMEN:  No Specimen  DISPOSITION OF SPECIMEN:  N/A  COUNTS:  YES  TOURNIQUET:  * No tourniquets in log *  DICTATION: .Other Dictation: Dictation Number [id number]  PLAN OF CARE: Admit to inpatient   PATIENT DISPOSITION:  PACU - hemodynamically stable.   Delay start of Pharmacological VTE agent (>24hrs) due to surgical blood loss or risk of bleeding: no

## 2012-11-03 NOTE — Anesthesia Postprocedure Evaluation (Signed)
  Anesthesia Post-op Note  Patient: [name] [name]  Procedure(s) Performed: Procedure(s) (LRB): TOTAL HIP ARTHROPLASTY ANTERIOR APPROACH (Right)  Patient Location: PACU  Anesthesia Type: General  Level of Consciousness: awake and alert   Airway and Oxygen Therapy: Patient Spontanous Breathing  Post-op Pain: mild  Post-op Assessment: Post-op Vital signs reviewed, Patient's Cardiovascular Status Stable, Respiratory Function Stable, Patent Airway and No signs of Nausea or vomiting  Last Vitals:  Filed Vitals:   11/03/12 1015  BP: 141/77  Pulse: 90  Temp:   Resp: 17    Post-op Vital Signs: stable   Complications: No apparent anesthesia complications

## 2012-11-03 NOTE — Progress Notes (Signed)
X-RAY RESULTS NOTED. 

## 2012-11-03 NOTE — Preoperative (Signed)
Beta Blockers   Reason not to administer Beta Blockers:Not Applicable, not on home BB 

## 2012-11-03 NOTE — Progress Notes (Signed)
Portable AP PELVIS AND LATERAL RIGHT HIP X-RAYS DONE. 

## 2012-11-03 NOTE — Anesthesia Preprocedure Evaluation (Addendum)
Anesthesia Evaluation  Patient identified by MRN, date of birth, ID band Patient awake    Reviewed: Allergy & Precautions, H&P , NPO status , Patient's Chart, lab work & pertinent test results  Airway Mallampati: II TM Distance: <3 FB Neck ROM: Full    Dental No notable dental hx.    Pulmonary Current Smoker,  breath sounds clear to auscultation  Pulmonary exam normal       Cardiovascular hypertension, Pt. on medications Rhythm:Regular Rate:Normal     Neuro/Psych negative neurological ROS  negative psych ROS   GI/Hepatic negative GI ROS, Neg liver ROS,   Endo/Other  negative endocrine ROS  Renal/GU negative Renal ROS  negative genitourinary   Musculoskeletal negative musculoskeletal ROS (+)   Abdominal   Peds negative pediatric ROS (+)  Hematology negative hematology ROS (+)   Anesthesia Other Findings   Reproductive/Obstetrics negative OB ROS                          Anesthesia Physical Anesthesia Plan  ASA: III  Anesthesia Plan: General   Post-op Pain Management:    Induction: Intravenous  Airway Management Planned: Oral ETT  Additional Equipment:   Intra-op Plan:   Post-operative Plan: Extubation in OR  Informed Consent: I have reviewed the patients History and Physical, chart, labs and discussed the procedure including the risks, benefits and alternatives for the proposed anesthesia with the patient or authorized representative who has indicated his/her understanding and acceptance.   Dental advisory given  Plan Discussed with: CRNA and Surgeon  Anesthesia Plan Comments:        Anesthesia Quick Evaluation

## 2012-11-03 NOTE — Progress Notes (Signed)
PHARMACIST - PHYSICIAN ORDER COMMUNICATION  CONCERNING: P&T Medication Policy on Herbal Medications  DESCRIPTION:  This patient's order for:  Co-enzyme Q10 has been noted.  This product(s) is classified as an "herbal" or natural product. Due to a lack of definitive safety studies or FDA approval, nonstandard manufacturing practices, plus the potential risk of unknown drug-drug interactions while on inpatient medications, the Pharmacy and Therapeutics Committee does not permit the use of "herbal" or natural products of this type within [facility].   ACTION TAKEN: The pharmacy department is unable to verify this order at this time and your patient has been informed of this safety policy. Please reevaluate patient's clinical condition at discharge and address if the herbal or natural product(s) should be resumed at that time.  Geoffry Paradise, PharmD, BCPS Pager: [phone] 1:31 PM Pharmacy #: [phone]

## 2012-11-03 NOTE — Transfer of Care (Signed)
Immediate Anesthesia Transfer of Care Note  Patient: [name] [name]  Procedure(s) Performed: Procedure(s) (LRB) with comments: TOTAL HIP ARTHROPLASTY ANTERIOR APPROACH (Right) - Right Total Hip Arthroplasty, Anterior Approach (C-Arm)  Patient Location: PACU  Anesthesia Type:General  Level of Consciousness: awake, sedated and responds to stimulation  Airway & Oxygen Therapy: Patient Spontanous Breathing and Patient connected to face mask oxygen  Post-op Assessment: Report given to PACU RN, Post -op Vital signs reviewed and stable and Patient moving all extremities X 4  Post vital signs: Reviewed and stable  Complications: No apparent anesthesia complications

## 2012-11-03 NOTE — H&P (Signed)
TOTAL HIP ADMISSION H&P  Patient is admitted for right total hip arthroplasty.  Subjective:  Chief Complaint: right hip pain  HPI: [name] [name], 71 y.o. female, has a history of pain and functional disability in the right hip(s) due to arthritis and patient has failed non-surgical conservative treatments for greater than 12 weeks to include NSAID's and/or analgesics and activity modification.  Onset of symptoms was gradual starting 2 years ago with gradually worsening course since that time.The patient noted no past surgery on the right hip(s).  Patient currently rates pain in the right hip at 8 out of 10 with activity. Patient has night pain, worsening of pain with activity and weight bearing, pain that interfers with activities of daily living and pain with passive range of motion. Patient has evidence of subchondral cysts, subchondral sclerosis, periarticular osteophytes and joint space narrowing by imaging studies. This condition presents safety issues increasing the risk of falls.  There is no current active infection.  Patient Active Problem List   Diagnosis Date Noted  . Degenerative arthritis of hip 11/03/2012   Past Medical History  Diagnosis Date  . Hypertension   . Arthritis     Past Surgical History  Procedure Date  . Tonsillectomy     Prescriptions prior to admission  Medication Sig Dispense Refill  . amLODipine-benazepril (LOTREL) 5-20 MG per capsule Take 1 capsule by mouth every morning.      Marland Kitchen atorvastatin (LIPITOR) 20 MG tablet Take 20 mg by mouth daily before breakfast.       . carboxymethylcellulose (REFRESH TEARS) 0.5 % SOLN Place 1 drop into both eyes daily as needed. For dry eyes      . Coenzyme Q10 (CO Q 10) 100 MG CAPS Take 300 mg by mouth at bedtime.      Marland Kitchen etodolac (LODINE) 400 MG tablet Take 400 mg by mouth 2 (two) times daily.      . hydrochlorothiazide (HYDRODIURIL) 25 MG tablet Take 25 mg by mouth every morning.      . Multiple Vitamin (MULTIVITAMIN  WITH MINERALS) TABS Take 1 tablet by mouth daily.       Allergies  Allergen Reactions  . Penicillins Hives and Swelling    History  Substance Use Topics  . Smoking status: Current Every Day Smoker -- 1.0 packs/day for 40 years    Types: Cigarettes  . Smokeless tobacco: Not on file  . Alcohol Use: Yes     Comment: 2-3 drinks of liquor nightly    History reviewed. No pertinent family history.   Review of Systems  Musculoskeletal: Positive for joint pain.  All other systems reviewed and are negative.    Objective:  Physical Exam  Constitutional: She is oriented to person, place, and time. She appears well-developed and well-nourished.  HENT:  Head: Normocephalic and atraumatic.  Eyes: EOM are normal. Pupils are equal, round, and reactive to light.  Neck: Normal range of motion. Neck supple.  Cardiovascular: Normal rate and regular rhythm.   Respiratory: Effort normal and breath sounds normal.  GI: Soft. Bowel sounds are normal.  Musculoskeletal:       Right hip: She exhibits decreased range of motion, decreased strength and bony tenderness.  Neurological: She is alert and oriented to person, place, and time.  Skin: Skin is warm and dry.  Psychiatric: She has a normal mood and affect.    Vital signs in last 24 hours: Temp:  [98.2 F (36.8 C)] 98.2 F (36.8 C) (12/05 0530) Pulse Rate:  [  99] 99  (12/05 0530) Resp:  [18] 18  (12/05 0530) BP: (164)/(78) 164/78 mmHg (12/05 0530) SpO2:  [97 %] 97 % (12/05 0530)  Labs:   There is no height or weight on file to calculate BMI.   Imaging Review Plain radiographs demonstrate severe degenerative joint disease of the right hip(s). The bone quality appears to be excellent for age and reported activity level.  Assessment/Plan:  End stage arthritis, right hip(s)  The patient history, physical examination, clinical judgement of the provider and imaging studies are consistent with end stage degenerative joint disease of the  right hip(s) and total hip arthroplasty is deemed medically necessary. The treatment options including medical management, injection therapy, arthroscopy and arthroplasty were discussed at length. The risks and benefits of total hip arthroplasty were presented and reviewed. The risks due to aseptic loosening, infection, stiffness, dislocation/subluxation,  thromboembolic complications and other imponderables were discussed.  The patient acknowledged the explanation, agreed to proceed with the plan and consent was signed. Patient is being admitted for inpatient treatment for surgery, pain control, PT, OT, prophylactic antibiotics, VTE prophylaxis, progressive ambulation and ADL's and discharge planning.The patient is planning to be discharged home with home health services

## 2012-11-04 ENCOUNTER — Encounter (HOSPITAL_COMMUNITY): Payer: Self-pay | Admitting: Orthopaedic Surgery

## 2012-11-04 LAB — CBC
HCT: 25.5 % — ABNORMAL LOW (ref 36.0–46.0)
Hemoglobin: 8.8 g/dL — ABNORMAL LOW (ref 12.0–15.0)
MCH: 32.6 pg (ref 26.0–34.0)
MCHC: 34.5 g/dL (ref 30.0–36.0)
MCV: 94.4 fL (ref 78.0–100.0)
RDW: 13.6 % (ref 11.5–15.5)

## 2012-11-04 LAB — BASIC METABOLIC PANEL
BUN: 16 mg/dL (ref 6–23)
Creatinine, Ser: 0.83 mg/dL (ref 0.50–1.10)
GFR calc Af Amer: 82 mL/min — ABNORMAL LOW (ref >90)
GFR calc non Af Amer: 70 mL/min — ABNORMAL LOW (ref >90)
Glucose, Bld: 112 mg/dL — ABNORMAL HIGH (ref 70–99)
Potassium: 3.9 mEq/L (ref 3.5–5.1)

## 2012-11-04 NOTE — Progress Notes (Signed)
Subjective: 1 Day Post-Op Procedure(s) (LRB): TOTAL HIP ARTHROPLASTY ANTERIOR APPROACH (Right) Patient reports pain as moderate.  Asymptomatic acute blood loss anemia.  Objective: Vital signs in last 24 hours: Temp:  [97.8 F (36.6 C)-98.9 F (37.2 C)] 98.4 F (36.9 C) (12/06 0550) Pulse Rate:  [72-97] 96  (12/06 0550) Resp:  [12-20] 20  (12/06 0550) BP: (108-130)/(52-86) 112/65 mmHg (12/06 0550) SpO2:  [94 %-98 %] 97 % (12/06 0550) Weight:  [59 kg (130 lb 1.1 oz)] 59 kg (130 lb 1.1 oz) (12/06 0158)  Intake/Output from previous day: 12/05 0701 - 12/06 0700 In: 4067.5 [P.O.:900; I.V.:3167.5] Out: 1605 [Urine:1230; Blood:375] Intake/Output this shift:     Basename 11/04/12 0425  HGB 8.8*    Basename 11/04/12 0425  WBC 8.8  RBC 2.70*  HCT 25.5*  PLT 204    Basename 11/04/12 0425  NA 131*  K 3.9  CL 97  CO2 25  BUN 16  CREATININE 0.83  GLUCOSE 112*  CALCIUM 8.7   No results found for this basename: LABPT:2,INR:2 in the last 72 hours  Sensation intact distally Intact pulses distally Dorsiflexion/Plantar flexion intact Incision: dressing C/D/I Compartment soft  Assessment/Plan: 1 Day Post-Op Procedure(s) (LRB): TOTAL HIP ARTHROPLASTY ANTERIOR APPROACH (Right) Up with therapy  Alphonse Asbridge Y 11/04/2012, 12:38 PM

## 2012-11-04 NOTE — Evaluation (Signed)
Occupational Therapy Evaluation Patient Details Name: [name] [name] MRN: [id number] DOB: 23-Jan-1941 Today's Date: 11/04/2012 Time: 4098-1191 OT Time Calculation (min): 11 min  OT Assessment / Plan / Recommendation Clinical Impression  This 71 year old female was admitted for R anterior direct approach THA.  She willl have help for a week.  All education was completed and pt does not have any further OT needs.      OT Assessment       Follow Up Recommendations  No OT follow up    Barriers to Discharge      Equipment Recommendations  Rolling walker with 5" wheels;pt has borrowed an elevated toilet seat with rails    Recommendations for Other Services    Frequency       Precautions / Restrictions Precautions Precautions: None Restrictions Weight Bearing Restrictions: No RLE Weight Bearing: Weight bearing as tolerated   Pertinent Vitals/Pain R hip is sore:  RN brought pain medication    ADL  Transfers/Ambulation Related to ADLs: Pt had used bathroom an hour ago and feels comfortable with transfer  Educated on side stepping into bathroom as door will not accommodate walker straight on.  Pt will sponge bathe at sink initially and she has alternative beds to sleep on as she needs a 2 step stool to get up on hers.   ADL Comments: educated on AE:  pt verbalizes understanding of all and used sock aid.  She plans to get a kit--she doesn't have to follow precautions but cannot yet reach for adls.      OT Diagnosis:    OT Problem List:   OT Treatment Interventions:     OT Goals    Visit Information  Last OT Received On: 11/04/12 Assistance Needed: +1    Subjective Data  Subjective: I was having trouble with my socks before this surgery   Prior Functioning     Home Living Home Adaptive Equipment: Walker - rolling;Raised toilet seat with rails Additional Comments: shower is upstairs; will sponge bathe initially Prior Function Comments: will have help for a  week Communication Communication: No difficulties         Vision/Perception     Cognition  Overall Cognitive Status: Appears within functional limits for tasks assessed/performed Arousal/Alertness: Awake/alert Orientation Level: Appears intact for tasks assessed Behavior During Session: [facility] for tasks performed    Extremity/Trunk Assessment Right Upper Extremity Assessment RUE ROM/Strength/Tone: [facility] for tasks assessed Left Upper Extremity Assessment LUE ROM/Strength/Tone: [facility] for tasks assessed Trunk Assessment Trunk Assessment: Normal     Mobility      Shoulder Instructions     Exercise     Balance     End of Session OT - End of Session Patient left: in chair;with call bell/phone within reach;with family/visitor present  GO     Ednamae Schiano 11/04/2012, 1:46 PM Marica Otter, OTR/L [phone] 11/04/2012

## 2012-11-04 NOTE — Progress Notes (Signed)
Physical Therapy Treatment Patient Details Name: [name] [name] MRN: [id number] DOB: 1941-01-16 Today's Date: 11/04/2012 Time: 7829-5621 PT Time Calculation (min): 25 min  PT Assessment / Plan / Recommendation Comments on Treatment Session  POD # 1 pm session R Direct Anterior THR.  Amb pt in hallway then assisted back to bed to perform TE's.  Applied ICE.    Follow Up Recommendations  Home health PT     Does the patient have the potential to tolerate intense rehabilitation     Barriers to Discharge        Equipment Recommendations  Rolling walker with 5" wheels;3 in 1 bedside comode    Recommendations for Other Services    Frequency 7X/week   Plan Discharge plan remains appropriate    Precautions / Restrictions Precautions Precautions: None Precaution Comments: Direct Anterior THR Restrictions Weight Bearing Restrictions: No RLE Weight Bearing: Weight bearing as tolerated   Pertinent Vitals/Pain C/o 3/10 R hip pain during TE's ICE applied    Mobility  Bed Mobility Bed Mobility: Sit to Supine Sit to Supine: 4: Min assist Details for Bed Mobility Assistance: Min assist to support R LE up onto bed and increased time Transfers Transfers: Sit to Stand;Stand to Sit Sit to Stand: 4: Min assist;From chair/3-in-1 Stand to Sit: 4: Min assist;To bed Details for Transfer Assistance: <25% VC's on proper tech and hand placement Ambulation/Gait Ambulation/Gait Assistance: 4: Min guard;4: Min Environmental consultant (Feet): 45 Feet Assistive device: Rolling walker Ambulation/Gait Assistance Details: <25% VC's on safety with turns and backward gait sequencing plus increase time as pt c/o "tightness" Gait Pattern: Step-to pattern;Decreased stance time - right Gait velocity: decreased    Exercises Total Joint Exercises Ankle Circles/Pumps: AROM;Both;10 reps;Supine Quad Sets: AROM;Both;10 reps;Supine Gluteal Sets: AROM;Both;10 reps;Supine Short Arc Quad: AROM;10  reps;Supine;Right Heel Slides: AAROM;Right;10 reps;Supine Hip ABduction/ADduction: AAROM;Right;10 reps;Supine   PT Goals    Visit Information  Last PT Received On: 11/04/12 Assistance Needed: +1    Subjective Data      Cognition       Balance     End of Session PT - End of Session Equipment Utilized During Treatment: Gait belt Activity Tolerance: Patient tolerated treatment well Patient left: in bed;with call bell/phone within reach (ICE to R hip)   Felecia Shelling  PTA [facility]  Acute  Rehab Pager     [phone]

## 2012-11-04 NOTE — Evaluation (Signed)
Physical Therapy Evaluation Patient Details Name: [name] [name] MRN: [id number] DOB: 12-22-1940 Today's Date: 11/04/2012 Time: 4098-1191 PT Time Calculation (min): 28 min  PT Assessment / Plan / Recommendation Clinical Impression  71 yo female s/p R direct anterior THA POD 1. Mobilizing well but experiencing significant pain with activity. Anticipate pt will progress well. Pt states she will have family to assist at home. Recommend HHPT at discharge. Pt also states she will borrow RW and 3n1 from neighbor.     PT Assessment  Patient needs continued PT services    Follow Up Recommendations  Home health PT    Does the patient have the potential to tolerate intense rehabilitation      Barriers to Discharge        Equipment Recommendations  Rolling walker with 5" wheels;3 in 1 bedside comode (pt states whe will borrow equipment)    Recommendations for Other Services OT consult   Frequency 7X/week    Precautions / Restrictions Precautions Precautions: None Restrictions Weight Bearing Restrictions: No RLE Weight Bearing: Weight bearing as tolerated   Pertinent Vitals/Pain 9/10 R hip with activity      Mobility  Bed Mobility Bed Mobility: Supine to Sit Supine to Sit: 4: Min assist Details for Bed Mobility Assistance: Assist for R LE off bed.  Transfers Transfers: Sit to Stand;Stand to Sit Sit to Stand: 4: Min assist;From bed Stand to Sit: 4: Min assist;To chair/3-in-1;With armrests Details for Transfer Assistance: VCs safety, technique, hand placement. Assist to rise, stabilize, control descent.  Ambulation/Gait Ambulation/Gait Assistance: 4: Min guard Ambulation Distance (Feet): 25 Feet Assistive device: Rolling walker Ambulation/Gait Assistance Details: VCs safety, sequence, technique. Pt reports increased pain with ambulation 9/10. Limited distance due to pain.  Gait Pattern: Step-to pattern;Decreased stride length;Decreased step length - right;Decreased step length  - left;Antalgic;Decreased stance time - right    Shoulder Instructions     Exercises     PT Diagnosis: Difficulty walking;Abnormality of gait;Acute pain  PT Problem List: Decreased strength;Decreased range of motion;Decreased activity tolerance;Pain;Decreased mobility;Decreased knowledge of use of DME PT Treatment Interventions: DME instruction;Gait training;Functional mobility training;Stair training;Therapeutic activities;Therapeutic exercise;Patient/family education   PT Goals Acute Rehab PT Goals PT Goal Formulation: With patient Time For Goal Achievement: 11/11/12 Potential to Achieve Goals: Good Pt will go Supine/Side to Sit: with supervision PT Goal: Supine/Side to Sit - Progress: Goal set today Pt will go Sit to Supine/Side: with supervision PT Goal: Sit to Supine/Side - Progress: Goal set today Pt will go Sit to Stand: with supervision PT Goal: Sit to Stand - Progress: Goal set today Pt will Ambulate: 51 - 150 feet;with supervision;with rolling walker PT Goal: Ambulate - Progress: Goal set today Pt will Go Up / Down Stairs: 3-5 stairs;with supervision;with least restrictive assistive device PT Goal: Up/Down Stairs - Progress: Goal set today  Visit Information  Last PT Received On: 11/04/12 Assistance Needed: +1    Subjective Data  Subjective: "It's starting to hurt again....like someone is stabbing me in with an ice pick." Patient Stated Goal: Less pain.    Prior Functioning  Home Living Lives With: Spouse Available Help at Discharge: Family Type of Home: House Home Access: Stairs to enter Entrance Stairs-Rails: None Home Layout: Two level Alternate Level Stairs-Number of Steps: 8+8 Alternate Level Stairs-Rails: None Bathroom Shower/Tub: Health visitor: Standard Home Adaptive Equipment: Walker - rolling;Bedside commode/3-in-1 Prior Function Level of Independence: Independent Able to Take Stairs?: Yes Driving:  Yes Communication Communication: No difficulties    Cognition  Overall Cognitive Status: Appears within functional limits for tasks assessed/performed Arousal/Alertness: Awake/alert Orientation Level: Appears intact for tasks assessed Behavior During Session: [facility] for tasks performed    Extremity/Trunk Assessment Right Lower Extremity Assessment RLE ROM/Strength/Tone: Deficits RLE ROM/Strength/Tone Deficits: hip flex 2/5, moves ankle well, hip add 2/5 Left Lower Extremity Assessment LLE ROM/Strength/Tone: WFL for tasks assessed Trunk Assessment Trunk Assessment: Normal   Balance    End of Session PT - End of Session Equipment Utilized During Treatment: Gait belt Activity Tolerance: Patient limited by pain Patient left: in chair;with call bell/phone within reach;with family/visitor present  GP     Rebeca Alert [facility] [place] 11/04/2012, 12:15 PM [phone]

## 2012-11-04 NOTE — Op Note (Signed)
[name] [name], [name] [name]             ACCOUNT NO.:  [id number]  MEDICAL RECORD NO.:  [id number]  LOCATION:                                 FACILITY:  PHYSICIAN:  Vanita Panda. Magnus Ivan, M.D.DATE OF BIRTH:  DATE OF PROCEDURE:  11/03/2012 DATE OF DISCHARGE:                              OPERATIVE REPORT   PREOPERATIVE DIAGNOSIS:  End-stage arthritis and degenerative disease, right hip.  POSTOPERATIVE DIAGNOSIS:  End-stage arthritis and degenerative disease, right hip.  PROCEDURE:  Right total hip arthroplasty through direct anterior approach.  IMPLANTS:  DePuy Sector Gription acetabular component size 52, size 36+ 4 neutral polyethylene liner, size 10 Corail femoral component with varus offset (KLA), size 36- 2 metal hip ball.  SURGEON:  Vanita Panda. Magnus Ivan, MD  ANESTHESIA:  General.  BLOOD LOSS:  Less than 500 mL.  COMPLICATIONS:  None.  ANTIBIOTICS:  IV clindamycin.  COMPLICATIONS:  None.  INDICATIONS:  [name] [name] is a very active 71 year old female with debilitating right hip pain for over 2 years now.  It has gotten to where it affects her activities of daily living. Her pain and mobility are limited daily.  She ambulates occasionally with a cane due to the pain in her hip and it has gotten to where she has failure of conservative treatment and wished to proceed with a total hip arthroplasty.  Her x-rays show marginal osteophytes, complete loss of the joint space on the right hip with subchondral sclerosis and cystic changes.  The risks and benefits of surgery were explained to her in detail and she does wish to proceed.  PROCEDURE DESCRIPTION:  After informed consent was obtained, appropriate right hip was marked.  She was brought to the operating room and general anesthesia was obtained while she was on her stretcher.  A Foley catheter was then placed and traction boots were placed on her feet so that she could then next be placed supine on the Hana fracture  table. Perineal post was placed and both legs were placed in inline skeletal traction but no traction applied.  Her hip was then assessed radiographically, so we could obtain her leg lengths and get the hip center.  We then prepped the right hip with DuraPrep and sterile drapes. A time-out was called and she was identified as correct patient, correct right hip.  I then made an incision just inferior and posterior to the anterior superior iliac spine and dissected down to the tensor fascia lata.  The tensor fascia was then divided longitudinally and proceeded with a direct anterior approach to the hip.  A Cobra retractor was placed around the lateral neck and up underneath the rectus femoris, a medial retractor was placed.  I cauterized the lateral femoral circumflex vessels and then divided the hip capsule.  I put the Cobra retractors within the hip capsule.  I then made my femoral neck cut just proximal to the lesser trochanter with an oscillating saw and completed this with an osteotome.  I placed a corkscrew guide in the femoral head and removed the femoral head in its entirety and found it to be devoid of cartilage.  I then placed a bent Hohmann medially in the acetabulum and a [employer]  retractor laterally.  I cleaned the acetabular debris and remnants of the labrum.  I then began reaming from size 42 reamer in 2 mm increments up to a size 52 with all reamers placed under direct visualization and the last reamer placed under direct fluoroscopy to gain my depth of reaming, my inclination and version.  I then placed the real Sector Gription acetabular component from DePuy size 52, the real apex hole eliminator guide.  Acetabular component was stable, so I placed a real 36+ 4 neutral polyethylene liner.  Attention was then turned to the femur.  With all traction off the leg, the leg was externally rotated to 90 degrees, extended and adducted.  This allowed me to gain access to the femoral  canal.  I placed a Mueller retractor medially and a Bent Hohmann under the greater trochanter.  I released the lateral capsule and then used a box cutting osteotome to open up the femoral canal.  I then began broaching from a size 8 broach up to a size 10 and the 10 was felt to be stable.  I trialed a standard neck and 36+ 4 hip ball, brought the leg back over and up and with traction and internal rotation, reduced this in the acetabulum.  It was very stable with internal and external rotation, but she was just little long.  We then dislocated the hip and I changed out the standard neck to a varus offset neck and a 36- 2 hip ball.  We reduced this back in the acetabulum and it was again stable and her leg lengths were measured equal.  I then removed all trial components and placed the real Corail femoral component with varus offset size 10 and the real 36- 2 metal hip ball.  We reduced this back in the acetabulum and again it was stable. We copiously irrigated the soft tissues with normal saline solution, closed the joint capsule with interrupted #1 Ethibond suture.  I used a running 0 V-Loc suture in the tensor fascia lata, 2-0 Vicryl in subcutaneous tissue, and staples on the skin.  Xeroform and well-padded sterile dressing was applied.  She was taken off the Hana table, awakened, extubated, and taken to recovery room in stable condition. All final counts were correct.  There were no complications noted.     Vanita Panda. Magnus Ivan, M.D.     CYB/MEDQ  D:  11/03/2012  T:  11/03/2012  Job:  [id number]

## 2012-11-05 LAB — BASIC METABOLIC PANEL
CO2: 27 mEq/L (ref 19–32)
Chloride: 96 mEq/L (ref 96–112)
Sodium: 132 mEq/L — ABNORMAL LOW (ref 135–145)

## 2012-11-05 LAB — CBC
Platelets: 229 10*3/uL (ref 150–400)
RBC: 3.07 MIL/uL — ABNORMAL LOW (ref 3.87–5.11)
WBC: 14.6 10*3/uL — ABNORMAL HIGH (ref 4.0–10.5)

## 2012-11-05 MED ORDER — FOLIC ACID 1 MG PO TABS
1.0000 mg | ORAL_TABLET | Freq: Every day | ORAL | Status: DC
  Administered 2012-11-05 – 2012-11-16 (×11): 1 mg via ORAL
  Filled 2012-11-05 (×12): qty 1

## 2012-11-05 MED ORDER — HYDROCODONE-ACETAMINOPHEN 7.5-325 MG PO TABS
1.0000 | ORAL_TABLET | ORAL | Status: DC | PRN
  Administered 2012-11-05 – 2012-11-07 (×5): 1 via ORAL
  Filled 2012-11-05 (×5): qty 1

## 2012-11-05 MED ORDER — LORAZEPAM 1 MG PO TABS: 1.0000 mg | ORAL_TABLET | Freq: Four times a day (QID) | ORAL | Status: DC | PRN

## 2012-11-05 MED ORDER — THIAMINE HCL 100 MG/ML IJ SOLN
100.0000 mg | Freq: Every day | INTRAMUSCULAR | Status: DC
  Administered 2012-11-08 – 2012-11-13 (×3): 100 mg via INTRAVENOUS
  Filled 2012-11-05 (×12): qty 1

## 2012-11-05 MED ORDER — VITAMIN B-1 100 MG PO TABS
100.0000 mg | ORAL_TABLET | Freq: Every day | ORAL | Status: DC
  Administered 2012-11-05 – 2012-11-16 (×9): 100 mg via ORAL
  Filled 2012-11-05 (×12): qty 1

## 2012-11-05 MED ORDER — LORAZEPAM 2 MG/ML IJ SOLN
1.0000 mg | Freq: Four times a day (QID) | INTRAMUSCULAR | Status: DC | PRN
  Administered 2012-11-07: 0.5 mg via INTRAVENOUS
  Filled 2012-11-05 (×2): qty 1

## 2012-11-05 NOTE — Progress Notes (Signed)
Physical Therapy Treatment Patient Details Name: [name] [name] MRN: [id number] DOB: 04/06/1941 Today's Date: 11/05/2012 Time: 4098-1191 PT Time Calculation (min): 19 min  PT Assessment / Plan / Recommendation Comments on Treatment Session  Pt c/o increased pain during session.  Pt still agreed to ambulate somewhat in hallway.  Per pt/family, she had just gotten pain meds.  Deferred exercises due to increased pain.      Follow Up Recommendations  Home health PT     Does the patient have the potential to tolerate intense rehabilitation     Barriers to Discharge        Equipment Recommendations  Rolling walker with 5" wheels;3 in 1 bedside comode    Recommendations for Other Services    Frequency 7X/week   Plan Discharge plan remains appropriate    Precautions / Restrictions Precautions Precautions: None Precaution Comments: Direct Anterior THR Restrictions Weight Bearing Restrictions: Yes RLE Weight Bearing: Weight bearing as tolerated   Pertinent Vitals/Pain 9/10 pain, pt was premedicated.     Mobility  Bed Mobility Bed Mobility: Not assessed (Pt in recliner when PT arrived. ) Transfers Transfers: Sit to Stand;Stand to Sit Sit to Stand: 4: Min assist;With upper extremity assist;With armrests;From chair/3-in-1 Stand to Sit: 4: Min assist;With upper extremity assist;With armrests;To chair/3-in-1 Details for Transfer Assistance: Performed x 2 in order to use 3in1 over toilet.  Mod cues for hand placement, esp in restroom and for LE management to assist with pain control.  Ambulation/Gait Ambulation/Gait Assistance: 4: Min assist Ambulation Distance (Feet): 50 Feet (then another 15') Assistive device: Rolling walker Ambulation/Gait Assistance Details: mod cues for sequencing/technique with RW and to negotiate RW.  Pt seems somewhat slow to process commands.   Gait Pattern: Step-to pattern;Decreased stance time - right Gait velocity: decreased    Exercises     PT  Diagnosis:    PT Problem List:   PT Treatment Interventions:     PT Goals Acute Rehab PT Goals PT Goal Formulation: With patient Time For Goal Achievement: 11/11/12 Potential to Achieve Goals: Good Pt will go Sit to Stand: with supervision PT Goal: Sit to Stand - Progress: Progressing toward goal Pt will Ambulate: 51 - 150 feet;with supervision;with rolling walker PT Goal: Ambulate - Progress: Progressing toward goal  Visit Information  Last PT Received On: 11/05/12 Assistance Needed: +1    Subjective Data  Subjective: It hurts so bad.  Patient Stated Goal: Less pain.    Cognition  Overall Cognitive Status: Appears within functional limits for tasks assessed/performed Arousal/Alertness: Awake/alert Orientation Level: Appears intact for tasks assessed Behavior During Session: [facility] for tasks performed    Balance     End of Session PT - End of Session Activity Tolerance: Patient limited by pain Patient left: in chair;with call bell/phone within reach;with family/visitor present Nurse Communication: Mobility status   GP     Page, Meribeth Mattes 11/05/2012, 11:57 AM

## 2012-11-05 NOTE — Progress Notes (Signed)
Physical Therapy Treatment Patient Details Name: [name] [name] MRN: [id number] DOB: 02-09-1941 Today's Date: 11/05/2012 Time: 1308-6578 PT Time Calculation (min): 26 min  PT Assessment / Plan / Recommendation Comments on Treatment Session  Pt states that she didn't want to take pain meds b/c of earlier confusion, however pt has been switched to different meds, therefore encouraged pt to try them to avoid 9-10/10 pain with mobility.  Pain meds given prior to ambulation and pt states somewhat improved pain when walking.     Follow Up Recommendations  Home health PT     Does the patient have the potential to tolerate intense rehabilitation     Barriers to Discharge        Equipment Recommendations  Rolling walker with 5" wheels;3 in 1 bedside comode    Recommendations for Other Services    Frequency 7X/week   Plan Discharge plan remains appropriate    Precautions / Restrictions Precautions Precautions: None Precaution Comments: Direct Anterior THR Restrictions Weight Bearing Restrictions: Yes RLE Weight Bearing: Weight bearing as tolerated   Pertinent Vitals/Pain 7/10 pain, premedicated.     Mobility  Bed Mobility Bed Mobility: Supine to Sit;Sit to Supine Supine to Sit: 4: Min assist Sit to Supine: 4: Min assist Details for Bed Mobility Assistance: Assist to support RLE into and out of bed with mod cues for hand placement on bed instead of handrails to better simulate home environment.   Transfers Transfers: Sit to Stand;Stand to Sit Sit to Stand: With upper extremity assist;From bed;4: Min guard Stand to Sit: With upper extremity assist;To bed;4: Min guard Details for Transfer Assistance: Min/guard for safety with cues for hand placement and safety.  Ambulation/Gait Ambulation/Gait Assistance: 4: Min guard Ambulation Distance (Feet): 65 Feet Assistive device: Rolling walker Ambulation/Gait Assistance Details: Min cues for sequencing/technique with RW and to maintain  gait speed (noted pt to increase speed when getting closer to bed).   Gait Pattern: Step-to pattern;Decreased stance time - right Gait velocity: decreased    Exercises Total Joint Exercises Ankle Circles/Pumps: AROM;Both;20 reps Quad Sets: AROM;Both;10 reps;Supine Short Arc Quad: AAROM;Right;10 reps Heel Slides: AAROM;Right;10 reps;Supine Hip ABduction/ADduction: AAROM;Right;10 reps;Supine   PT Diagnosis:    PT Problem List:   PT Treatment Interventions:     PT Goals Acute Rehab PT Goals PT Goal Formulation: With patient Time For Goal Achievement: 11/11/12 Potential to Achieve Goals: Good Pt will go Supine/Side to Sit: with supervision PT Goal: Supine/Side to Sit - Progress: Progressing toward goal Pt will go Sit to Supine/Side: with supervision PT Goal: Sit to Supine/Side - Progress: Progressing toward goal Pt will go Sit to Stand: with supervision PT Goal: Sit to Stand - Progress: Progressing toward goal Pt will Ambulate: 51 - 150 feet;with supervision;with rolling walker PT Goal: Ambulate - Progress: Progressing toward goal  Visit Information  Last PT Received On: 11/05/12 Assistance Needed: +1    Subjective Data  Subjective: I don't want to take pain pills, but my hip is hurting.  Patient Stated Goal: Less pain.    Cognition  Overall Cognitive Status: Appears within functional limits for tasks assessed/performed Arousal/Alertness: Awake/alert Orientation Level: Appears intact for tasks assessed Behavior During Session: [facility] for tasks performed    Balance     End of Session PT - End of Session Activity Tolerance: Patient limited by pain;Patient limited by fatigue Patient left: in bed;with call bell/phone within reach;with family/visitor present Nurse Communication: Mobility status   GP     Page, Meribeth Mattes 11/05/2012, 4:48  PM   

## 2012-11-05 NOTE — Progress Notes (Signed)
Subjective: Pt stable-  -walking in room - confused per caregiver - on dilaudid oxycodone and oxycontin   Objective: Vital signs in last 24 hours: Temp:  [98.3 F (36.8 C)-100.1 F (37.8 C)] 99.8 F (37.7 C) (12/07 0546) Pulse Rate:  [92-115] 115  (12/07 0546) Resp:  [14-16] 16  (12/07 0546) BP: (110-152)/(67-69) 152/69 mmHg (12/07 0546) SpO2:  [91 %-98 %] 91 % (12/07 0546)  Intake/Output from previous day: 12/06 0701 - 12/07 0700 In: 1391.2 [P.O.:480; I.V.:911.2] Out: 1635 [Urine:1635] Intake/Output this shift: Total I/O In: 120 [P.O.:120] Out: 400 [Urine:400]  Exam:  Neurovascular intact Sensation intact distally Intact pulses distally Dorsiflexion/Plantar flexion intact  Labs:  Basename 11/05/12 0603 11/04/12 0425  HGB 9.8* 8.8*    Basename 11/05/12 0603 11/04/12 0425  WBC 14.6* 8.8  RBC 3.07* 2.70*  HCT 29.6* 25.5*  PLT 229 204    Basename 11/04/12 0425  NA 131*  K 3.9  CL 97  CO2 25  BUN 16  CREATININE 0.83  GLUCOSE 112*  CALCIUM 8.7   No results found for this basename: LABPT:2,INR:2 in the last 72 hours  Assessment/Plan: Na 131 - wbc up slightly - plan to change to hydrocodone - hl iv   [name] [name] SCOTT 11/05/2012, 10:29 AM

## 2012-11-06 ENCOUNTER — Inpatient Hospital Stay (HOSPITAL_COMMUNITY): Payer: Medicare Other

## 2012-11-06 ENCOUNTER — Other Ambulatory Visit: Payer: Self-pay

## 2012-11-06 DIAGNOSIS — R509 Fever, unspecified: Secondary | ICD-10-CM | POA: Diagnosis present

## 2012-11-06 DIAGNOSIS — R0602 Shortness of breath: Secondary | ICD-10-CM | POA: Insufficient documentation

## 2012-11-06 DIAGNOSIS — Z96649 Presence of unspecified artificial hip joint: Secondary | ICD-10-CM

## 2012-11-06 DIAGNOSIS — I509 Heart failure, unspecified: Secondary | ICD-10-CM | POA: Diagnosis not present

## 2012-11-06 DIAGNOSIS — R0902 Hypoxemia: Secondary | ICD-10-CM | POA: Diagnosis present

## 2012-11-06 DIAGNOSIS — R410 Disorientation, unspecified: Secondary | ICD-10-CM | POA: Diagnosis present

## 2012-11-06 DIAGNOSIS — F29 Unspecified psychosis not due to a substance or known physiological condition: Secondary | ICD-10-CM

## 2012-11-06 LAB — COMPREHENSIVE METABOLIC PANEL
ALT: 14 U/L (ref 0–35)
Alkaline Phosphatase: 61 U/L (ref 39–117)
CO2: 27 mEq/L (ref 19–32)
Chloride: 94 mEq/L — ABNORMAL LOW (ref 96–112)
GFR calc Af Amer: >90 mL/min (ref >90)
GFR calc non Af Amer: 89 mL/min — ABNORMAL LOW (ref >90)
Glucose, Bld: 105 mg/dL — ABNORMAL HIGH (ref 70–99)
Potassium: 3.4 mEq/L — ABNORMAL LOW (ref 3.5–5.1)
Sodium: 132 mEq/L — ABNORMAL LOW (ref 135–145)

## 2012-11-06 LAB — CBC WITH DIFFERENTIAL/PLATELET
Basophils Absolute: 0 10*3/uL (ref 0.0–0.1)
Basophils Relative: 0 % (ref 0–1)
HCT: 27.6 % — ABNORMAL LOW (ref 36.0–46.0)
MCHC: 35.1 g/dL (ref 30.0–36.0)
Monocytes Absolute: 0.7 10*3/uL (ref 0.1–1.0)
Neutro Abs: 12.4 10*3/uL — ABNORMAL HIGH (ref 1.7–7.7)
RDW: 13.6 % (ref 11.5–15.5)

## 2012-11-06 LAB — PRO B NATRIURETIC PEPTIDE: Pro B Natriuretic peptide (BNP): 1378 pg/mL — ABNORMAL HIGH (ref 0–125)

## 2012-11-06 LAB — TROPONIN I
Troponin I: <0.3 ng/mL (ref <0.30)
Troponin I: <0.3 ng/mL (ref <0.30)

## 2012-11-06 MED ORDER — ALBUTEROL SULFATE (5 MG/ML) 0.5% IN NEBU
2.5000 mg | INHALATION_SOLUTION | Freq: Four times a day (QID) | RESPIRATORY_TRACT | Status: DC
  Administered 2012-11-06 (×2): 2.5 mg via RESPIRATORY_TRACT
  Filled 2012-11-06 (×2): qty 0.5

## 2012-11-06 MED ORDER — ALBUTEROL SULFATE (5 MG/ML) 0.5% IN NEBU: 2.5000 mg | INHALATION_SOLUTION | RESPIRATORY_TRACT | Status: DC | PRN

## 2012-11-06 MED ORDER — VANCOMYCIN HCL 1000 MG IV SOLR
750.0000 mg | Freq: Once | INTRAVENOUS | Status: AC
  Administered 2012-11-06: 750 mg via INTRAVENOUS
  Filled 2012-11-06: qty 750

## 2012-11-06 MED ORDER — LEVOFLOXACIN IN D5W 750 MG/150ML IV SOLN
750.0000 mg | INTRAVENOUS | Status: DC
  Administered 2012-11-06 – 2012-11-14 (×9): 750 mg via INTRAVENOUS
  Filled 2012-11-06 (×9): qty 150

## 2012-11-06 MED ORDER — DEXTROSE 5 % IV SOLN
1.0000 g | Freq: Three times a day (TID) | INTRAVENOUS | Status: DC
  Administered 2012-11-06 – 2012-11-13 (×21): 1 g via INTRAVENOUS
  Filled 2012-11-06 (×24): qty 1

## 2012-11-06 MED ORDER — IPRATROPIUM BROMIDE 0.02 % IN SOLN
0.5000 mg | Freq: Four times a day (QID) | RESPIRATORY_TRACT | Status: DC
  Administered 2012-11-06 (×2): 0.5 mg via RESPIRATORY_TRACT
  Filled 2012-11-06 (×2): qty 2.5

## 2012-11-06 MED ORDER — HEPARIN SODIUM (PORCINE) 5000 UNIT/ML IJ SOLN
5000.0000 [IU] | Freq: Three times a day (TID) | INTRAMUSCULAR | Status: DC
  Administered 2012-11-06 – 2012-11-07 (×3): 5000 [IU] via SUBCUTANEOUS
  Filled 2012-11-06 (×6): qty 1

## 2012-11-06 MED ORDER — HEPARIN (PORCINE) IN NACL 100-0.45 UNIT/ML-% IJ SOLN
1000.0000 [IU]/h | INTRAMUSCULAR | Status: DC
  Administered 2012-11-06: 1000 [IU]/h via INTRAVENOUS
  Filled 2012-11-06: qty 250

## 2012-11-06 MED ORDER — FUROSEMIDE 10 MG/ML IJ SOLN
40.0000 mg | Freq: Two times a day (BID) | INTRAMUSCULAR | Status: DC
  Administered 2012-11-07: 40 mg via INTRAVENOUS
  Filled 2012-11-06 (×3): qty 4

## 2012-11-06 MED ORDER — IOHEXOL 350 MG/ML SOLN
100.0000 mL | Freq: Once | INTRAVENOUS | Status: AC | PRN
  Administered 2012-11-06: 100 mL via INTRAVENOUS

## 2012-11-06 MED ORDER — POTASSIUM CHLORIDE CRYS ER 20 MEQ PO TBCR
40.0000 meq | EXTENDED_RELEASE_TABLET | Freq: Every day | ORAL | Status: DC
  Administered 2012-11-07: 40 meq via ORAL
  Filled 2012-11-06 (×4): qty 2

## 2012-11-06 MED ORDER — VANCOMYCIN HCL 500 MG IV SOLR
500.0000 mg | Freq: Two times a day (BID) | INTRAVENOUS | Status: DC
  Administered 2012-11-06 – 2012-11-07 (×3): 500 mg via INTRAVENOUS
  Filled 2012-11-06 (×6): qty 500

## 2012-11-06 MED ORDER — POTASSIUM CHLORIDE CRYS ER 20 MEQ PO TBCR
40.0000 meq | EXTENDED_RELEASE_TABLET | Freq: Once | ORAL | Status: AC
  Administered 2012-11-06: 40 meq via ORAL
  Filled 2012-11-06: qty 2

## 2012-11-06 MED ORDER — FUROSEMIDE 10 MG/ML IJ SOLN
40.0000 mg | Freq: Once | INTRAMUSCULAR | Status: AC
  Administered 2012-11-06: 40 mg via INTRAVENOUS
  Filled 2012-11-06: qty 4

## 2012-11-06 MED ORDER — ALBUTEROL SULFATE (5 MG/ML) 0.5% IN NEBU
2.5000 mg | INHALATION_SOLUTION | Freq: Four times a day (QID) | RESPIRATORY_TRACT | Status: DC
  Administered 2012-11-06: 2.5 mg via RESPIRATORY_TRACT
  Filled 2012-11-06: qty 0.5

## 2012-11-06 NOTE — Progress Notes (Signed)
Physical Therapy Treatment Patient Details Name: Jermaine Tholl MRN: [id number] DOB: 08-03-1941 Today's Date: 11/06/2012 Time: 6578-4696 PT Time Calculation (min): 16 min  PT Assessment / Plan / Recommendation Comments on Treatment Session  Noted pts episode of destating last night/this morning.  Ambulated to/from restroom without oxygen and noted O2 sats to be in upper 70's.  Reapplied O2 in room when sitting with O2 sats increasing to 92%.   RN notified.  (CT scan ruled out PE, some infiltrates noted)    Follow Up Recommendations  Home health PT     Does the patient have the potential to tolerate intense rehabilitation     Barriers to Discharge        Equipment Recommendations  Rolling walker with 5" wheels;3 in 1 bedside comode    Recommendations for Other Services    Frequency 7X/week   Plan Discharge plan remains appropriate    Precautions / Restrictions Precautions Precautions: None Precaution Comments: Monitor O2 Restrictions Weight Bearing Restrictions: No RLE Weight Bearing: Weight bearing as tolerated   Pertinent Vitals/Pain 10/10 pain, O2 sats dropped to 70's with activity on RA.     Mobility  Bed Mobility Bed Mobility: Not assessed Transfers Transfers: Sit to Stand;Stand to Sit Sit to Stand: 4: Min assist;With upper extremity assist;With armrests;From chair/3-in-1 Stand to Sit: With upper extremity assist;To bed;4: Min guard Details for Transfer Assistance: Assist to rise with mod cues for hand placement and safety when sitting/standing.  Ambulation/Gait Ambulation/Gait Assistance: 4: Min guard Ambulation Distance (Feet): 15 Feet (then another 15') Assistive device: Rolling walker Ambulation/Gait Assistance Details: Cues for sequencing/technique with RW and to keep RW closer to her with ambulation.   Gait Pattern: Step-to pattern;Decreased stance time - right Gait velocity: decreased    Exercises     PT Diagnosis:    PT Problem List:   PT Treatment  Interventions:     PT Goals Acute Rehab PT Goals PT Goal Formulation: With patient Time For Goal Achievement: 11/11/12 Potential to Achieve Goals: Good Pt will go Sit to Stand: with supervision PT Goal: Sit to Stand - Progress: Progressing toward goal Pt will Ambulate: 51 - 150 feet;with supervision;with rolling walker PT Goal: Ambulate - Progress: Progressing toward goal  Visit Information  Last PT Received On: 11/06/12 Assistance Needed: +2 (safety, O2)    Subjective Data  Subjective: I'm giving up (upon walking to door) Patient Stated Goal: Less pain.    Cognition  Overall Cognitive Status: Appears within functional limits for tasks assessed/performed Arousal/Alertness: Awake/alert Orientation Level: Appears intact for tasks assessed Behavior During Session: [facility] for tasks performed    Balance     End of Session PT - End of Session Activity Tolerance: Patient limited by fatigue;Patient limited by pain Patient left: in chair;with call bell/phone within reach;with family/visitor present Nurse Communication: Mobility status (O2 sats)   GP     Page, Meribeth Mattes 11/06/2012, 11:49 AM

## 2012-11-06 NOTE — Progress Notes (Signed)
ANTICOAGULATION CONSULT NOTE - Initial Consult  Pharmacy Consult for Heparin Indication: R/O Pulmonary Embolism  Allergies  Allergen Reactions  . Penicillins Hives and Swelling    Patient Measurements: Height: 5\' 2"  (157.5 cm) Weight: 130 lb 1.1 oz (59 kg) IBW/kg (Calculated) : 50.1  Heparin Dosing Weight: 59 kg  Vital Signs: Temp: 100 F (37.8 C) (12/08 0148) Temp src: Oral (12/07 2138) BP: 114/63 mmHg (12/07 2138) Pulse Rate: 113  (12/07 2138)  Labs:  [name] [name] 11/05/12 0603 11/05/12 0602 11/04/12 0425  HGB 9.8* -- 8.8*  HCT 29.6* -- 25.5*  PLT 229 -- 204  APTT -- -- --  LABPROT -- -- --  INR -- -- --  HEPARINUNFRC -- -- --  CREATININE -- 0.70 0.83  CKTOTAL -- -- --  CKMB -- -- --  TROPONINI -- -- --    Estimated Creatinine Clearance: 52.5 ml/min (by C-G formula based on Cr of 0.7).   Medical History: Past Medical History  Diagnosis Date  . Hypertension   . Arthritis     Medications:  Scheduled:    . amLODipine  5 mg Oral Daily   And  . benazepril  20 mg Oral Daily  . aspirin EC  325 mg Oral BID  . atorvastatin  20 mg Oral q1800  . docusate sodium  100 mg Oral BID  . ferrous sulfate  325 mg Oral TID PC  . folic acid  1 mg Oral Daily  . hydrochlorothiazide  25 mg Oral q morning - 10a  . multivitamin with minerals  1 tablet Oral Daily  . thiamine  100 mg Oral Daily   Or  . thiamine  100 mg Intravenous Daily  . [DISCONTINUED] OxyCODONE  20 mg Oral Q12H   Infusions:    . [DISCONTINUED] sodium chloride 20 mL/hr at 11/04/12 1330    Assessment:  71 year old female s/p right THA on 11/03/12.  [facility] hospitalists consulted 12/8 for shortness of breath and confusion with hypoxemia  CTA of the chest ordered to r/o Pulmonary Embolism, considering recent surgery  IV heparin to begin empirically until PE ruled out.   Given recent surgery, will not bolus upon intiation of heparin drip.  Goal of Therapy:  Heparin level 0.3-0.7 units/ml Monitor  platelets by anticoagulation protocol: Yes   Plan:   Begin heparin drip @ 1000 units/hr  Check heparin level 6 hrs after heparin started  Check daily heparin level & CBC while on heparin drip  [name] [name], [name] [name], PharmD 11/06/2012,2:59 AM

## 2012-11-06 NOTE — Progress Notes (Signed)
Decrease in pt Oxygen saturation assessed between 70-80% room air.Oxygen was applied to 2L and increased to 3.5 liters. Dr. August Saucer was notified and chest xray was requested. Pt did not appear in acute distress, she was talking to her son at the bedside, and to this Clinical research associate. Temperature was less that 101.0. There where concerns about AMS changes on last night. During shift assessment, pt mentioned hearing a bark from a dog, otherwise she has not displayed any unusual behavior. Orders implemented and will continue to monitor.

## 2012-11-06 NOTE — Progress Notes (Signed)
[facility] PROGRESS NOTE  [name] [name] JYN:[id number] DOB: 06-Nov-1941 DOA: 11/03/2012 PCP: Benita Stabile, MD  Assessment/Plan: Principal Problem:  *Hypoxemia Active Problems:  Degenerative arthritis of hip  SOB (shortness of breath)  Confusion  Fever  S/P total hip arthroplasty    1. Hypoxia: Patient was noted to have oxygen desaturation into the 80s on 11/05/12, with out chest pain, SOB or cough. CTA doee to evaluate for possible PE, fortunately was negative for PE, but did reveal bilateral asymmetric ground glass opacities and interstitial prominence, CAD and cardiomegaly. Auscultatory findings elicited bilateral diffuse crackles. Patient is a febrile, has no cough or chest pain, although wcc is elevated, and ProBNP is 1378. I favor a diagnosis of fluid overload/CHF, and have placed patient on iv Lasix. Following ProBNP. IV Heparin has been discontinued. Have empirically covered for HCAP with Vanc/Aztreonam/Levaquin, and supportive treatment continues. Will cycle cardiac enzymes, do 12-lead EKG and arrange 2D Echocardiogram. 2. AMS: Patient had transient confusion on 11/05/12. This was likely secondary to hypoxia, and today, mental status is at baseline.  3. Tobacco Abuse: Patent smokes rather heavily, about half-one pack of cigarettes per day, since age 17 years. Have counseled her, but she seems reluctant to quit. Apparently, she does not tolerate Nicoderm patch.  4. Pulmonary nodule: A 13 mm LUL spiculated nodule was incidentally noted on CTA. Pulmonary consult will be necessary, after acute problems have stabilized.  5. HTN: This is controlled.  6. Endstage OA right hip: Patient is s/p right THA, POD# 3. manage per primary service.    Code Status: Full Code. Family Communication: Disposition Plan: To be determined. On CIWA Protocol for possible ETOH Withdrawal Sxs.    Brief narrative: 71 y.o. Female with known history of HTN, OA, who underwent a Right Hip THA due to  severe DJD on 11/03/12 by Dr. Doneen Poisson. On 11/05/12 she began to have confusion and desaturations into the 80's and began to run a low grade fever. She was placed on 4 liters of [place] O2 and had improvement of her O2 saturations to 94%. A Medical Consultation was requested by Dr. Dorene Grebe due to the hypoxemia and confusion.   Consultants:  Medical-[facility].  Ortho/Primary-Dr Magnus Ivan.  Procedures:  CTA.   Antibiotics:  Vancomycin 11/06/12>>>  Levaquin 11/06/12>>>  Aztreonam 11/06/12>>>  HPI/Subjective: Feels better. Lucid.   Objective: Vital signs in last 24 hours: Temp:  [98.8 F (37.1 C)-100.1 F (37.8 C)] 98.8 F (37.1 C) (12/08 0535) Pulse Rate:  [98-113] 98  (12/08 0535) Resp:  [15-20] 20  (12/08 0535) BP: (114-121)/(63-75) 121/75 mmHg (12/08 0535) SpO2:  [91 %-95 %] 95 % (12/08 0535) Weight change:  Last BM Date: 11/02/12  Intake/Output from previous day: 12/07 0701 - 12/08 0700 In: 1015 [P.O.:960; IV Piggyback:55] Out: 3450 [Urine:3450]     Physical Exam: General: Comfortable, alert, communicative, fully oriented, not short of breath at rest.  HEENT:  Mild clinical pallor, no jaundice, no conjunctival injection or discharge.  NECK:  Supple, JVP not seen, no carotid bruits, no palpable lymphadenopathy, no palpable goiter. CHEST:  Bilateral diffuse crackles, no wheeze. Marland Kitchen HEART:  Sounds 1 and 2 heard, normal, regular, no murmurs. ABDOMEN:  Moderately obese, soft, non-tender, no palpable organomegaly, no palpable masses, normal bowel sounds. GENITALIA:  Not examined. LOWER EXTREMITIES:  No pitting edema, palpable peripheral pulses. MUSCULOSKELETAL SYSTEM:  Generalized osteoarthritic changes. CENTRAL NERVOUS SYSTEM:  No focal neurologic deficit on gross examination.  Lab Results:  Basename 11/06/12 0400 11/05/12 0603  WBC  14.9* 14.6*  HGB 9.7* 9.8*  HCT 27.6* 29.6*  PLT 212 229    Basename 11/06/12 0400 11/05/12 0602  NA 132* 132*  K 3.4* 3.7   CL 94* 96  CO2 27 27  GLUCOSE 105* 102*  BUN 11 15  CREATININE 0.63 0.70  CALCIUM 9.1 9.3   Recent Results (from the past 240 hour(s))  SURGICAL PCR SCREEN     Status: Normal   Collection Time   10/31/12 10:44 AM      Component Value Range Status Comment   MRSA, PCR NEGATIVE  NEGATIVE Final    Staphylococcus aureus NEGATIVE  NEGATIVE Final      Studies/Results: Ct Angio Chest Pe W/cm &/or Wo Cm  11/06/2012  *RADIOLOGY REPORT*  Clinical Data: Low O2 sats.  Elevated white count.  Post surgery.  CT ANGIOGRAPHY CHEST  Technique:  Multidetector CT imaging of the chest using the standard protocol during bolus administration of intravenous contrast. Multiplanar reconstructed images including MIPs were obtained and reviewed to evaluate the vascular anatomy.  Contrast: OMNIPAQUE IOHEXOL 350 MG/ML SOLN  Comparison: Chest x-ray earlier today.  Findings: No filling defects in the pulmonary arteries to suggest pulmonary emboli.  There is mild cardiomegaly.  Small pericardial effusion.  No pleural effusions.  Ground-glass opacities and interstitial prominence throughout the lungs corresponding to abnormality on chest x-ray.  This is likely infectious or inflammatory or edema. Edema is felt less likely given the mid and upper lung zone predominance of the findings. Lung bases are relatively spared.  13 mm nodule in the anterior left upper lobe on image 21.  This is somewhat spiculated appearance and concerning for neoplasm.  Mild COPD changes.  Borderline sized mediastinal lymph nodes.  No hilar or axillary adenopathy.  Coronary artery calcifications are present.  Visualized thyroid and chest wall soft tissues unremarkable. Imaging into the upper abdomen shows no acute findings.  IMPRESSION: Cardiomegaly.  Ground-glass opacities and interstitial prominence throughout the lungs, most pronounced in the mid and upper lung zones. Differential considerations would include infectious or inflammatory pneumonitis  or less likely edema.  Coronary artery disease.  13 mm anterior left upper lobe nodule with spiculated appearance. Cannot exclude malignancy. Consider further evaluation with PET CT when acute symptoms resolve.  Borderline sized mediastinal lymph nodes.   Original Report Authenticated By: Charlett Nose, M.D.    Dg Chest Port 1 View  11/06/2012  *RADIOLOGY REPORT*  Clinical Data: Postop.  Question pneumonia.  PORTABLE CHEST - 1 VIEW  Comparison: 10/31/2012  Findings: There are low lung volumes.  Diffuse interstitial prominence of the lungs.  Somewhat confluent opacity noted in the right upper lobe.  Mild cardiomegaly.  No effusions.  No acute bony abnormality.  IMPRESSION: Diffuse interstitial prominence with more focal opacity in the right upper lobe.  This could represent edema or infection. Mild cardiomegaly.   Original Report Authenticated By: Charlett Nose, M.D.     Medications: Scheduled Meds:   . albuterol  2.5 mg Nebulization Q6H  . amLODipine  5 mg Oral Daily   And  . benazepril  20 mg Oral Daily  . aspirin EC  325 mg Oral BID  . atorvastatin  20 mg Oral q1800  . docusate sodium  100 mg Oral BID  . ferrous sulfate  325 mg Oral TID PC  . folic acid  1 mg Oral Daily  . furosemide  40 mg Intravenous Once  . hydrochlorothiazide  25 mg Oral q morning - 10a  .  levofloxacin (LEVAQUIN) IV  750 mg Intravenous Q24H  . multivitamin with minerals  1 tablet Oral Daily  . potassium chloride  40 mEq Oral Once  . thiamine  100 mg Oral Daily   Or  . thiamine  100 mg Intravenous Daily  . vancomycin  500 mg Intravenous Q12H  . vancomycin  750 mg Intravenous Once  . [DISCONTINUED] OxyCODONE  20 mg Oral Q12H   Continuous Infusions:   . [DISCONTINUED] sodium chloride 20 mL/hr at 11/04/12 1330  . [DISCONTINUED] heparin 1,000 Units/hr (11/06/12 0435)   PRN Meds:.acetaminophen, acetaminophen, albuterol, alum & mag hydroxide-simeth, diphenhydrAMINE, HYDROcodone-acetaminophen, [COMPLETED] iohexol,  LORazepam, LORazepam, menthol-cetylpyridinium, methocarbamol (ROBAXIN) IV, methocarbamol, metoCLOPramide (REGLAN) injection, metoCLOPramide, ondansetron (ZOFRAN) IV, ondansetron, phenol, polyvinyl alcohol, zolpidem, [DISCONTINUED]  HYDROmorphone (DILAUDID) injection, [DISCONTINUED] oxyCODONE    LOS: 3 days   Shameca Landen,CHRISTOPHER  [facility] Pager [phone]. If 8PM-8AM, please contact night-coverage at [email or web address], password [facility] 11/06/2012, 8:33 AM  LOS: 3 days

## 2012-11-06 NOTE — Consult Note (Addendum)
[facility] Medical Consultation  Praise Stennett WJX:[id number] DOB: [birth year] DOA: 11/03/2012 PCP: Benita Stabile, MD   Requesting physician: Dr. Dorene Grebe Date of consultation: 11/06/2012 Reason for consultation: SOB and Confusion  Impression/Recommendations      Hypoxemia      SOB      Confusion       Low Grade Fever      Hyponatremia      Anemia      Surgery on 12/05  (Right Hip THA due to DJD)      DJD      Hypertension      Hyperlipidemia      Alcohol History  1.  In light of recent surgery and immobilization and the development of hypoxemia, a Pulmonary Embolism/Emboli needs to be ruled out.   A CTA of the Chest has been ordered Stat.  The patient has been placed on supplemental oxygen at this time and is breathing comfortably.  Next in the differential would be possible post surgical pneumonia due to atelectasis, so the CTA of the Chest would also differentiate between the two conditions.     2.  Labs and Medications have been reviewed.  3.  An IV Heparin drip has been ordered until a PE has been ruled out.    4.   Check CBC w/Diff, and C-Met,  Last HB =9.8  ( On admission 13.6 , then 8.8 on 12/06 and 9.8 on 12/07 ), Also check a BNP.    5.  Add Telemetry Monitoring if + PE.    6.  Monitor for further changes.    7.  On CIWA Protocol for possible ETOH Withdrawal Sxs.    8.  The [facility] Team will followup again tomorrow.  Thank you for this consultation.    ADDENDUM:   CTA scan of the  CHEST:   RESULTS are negative for Pulmonary Embolism,   Reveals diffuse interstitial and ground glass infiltrates reflecting an infectious, inflammatory or Edema as an etiology.  ALSO a spiculated area is seen in the RUL.  Will continue IV heparin for now.  And place on IV antibiotics for HCAP with IV Vanc and Levaquin, BNP Pending.       Chief Complaint: SOB and Confusion  HPI: [name] [name] is an 71 y.o. Female who underwent a Right Hip THA due to  severe DJD on 12/05 by Dr. Doneen Poisson.  On 12/07 she began to have confusion and desaturations into the 80's and began to run a low grade fever.   She was placed on 4 liters of [place] O2 and had improvement of her O2 saturations to 94%.  A Medical Consultation was requested by Dr. Dorene Grebe due to the hypoxemia and confusion.  On interview and examination of the patient, She is currently lucid, and denies having any chest pain and she reports that she is currently breathing comfortably on the supplemental oxygen.   She states her SOB was related to pain in her hip, and she denies having confusion.   Her son is at the bedside, and he reports that she has been confused today.     [name] [name] reports drinking 3 glasses of alcohol at night and has done so for 30+years.   She is also a smoker and smoke 1 ppd X 30+ years, however she denies having emphysema, and denies using inhalers.     Review of Systems:  The patient denies anorexia, weight loss, vision loss, decreased hearing, hoarseness, chest pain,  syncope, dyspnea on exertion, peripheral edema, balance deficits, hemoptysis, abdominal pain, melena, hematochezia, severe indigestion/heartburn, hematuria, incontinence, dysuria, muscle weakness, suspicious skin lesions, transient blindness, depression, unusual weight change, abnormal bleeding, enlarged lymph nodes, angioedema, and breast masses.    Past Medical History  Diagnosis Date  . Hypertension   . Arthritis    Past Surgical History  Procedure Date  . Tonsillectomy   . Total hip arthroplasty 11/03/2012    Procedure: TOTAL HIP ARTHROPLASTY ANTERIOR APPROACH;  Surgeon: Kathryne Hitch, MD;  Location: [facility];  Service: Orthopedics;  Laterality: Right;  Right Total Hip Arthroplasty, Anterior Approach (C-Arm)     MEDICATIONS   Prior to Admission medications   Medication Sig Start Date End Date Taking? Authorizing Provider  amLODipine-benazepril (LOTREL) 5-20 MG per capsule Take 1  capsule by mouth every morning.   Yes Historical Provider, MD  atorvastatin (LIPITOR) 20 MG tablet Take 20 mg by mouth daily before breakfast.    Yes Historical Provider, MD  carboxymethylcellulose (REFRESH TEARS) 0.5 % SOLN Place 1 drop into both eyes daily as needed. For dry eyes   Yes Historical Provider, MD  Coenzyme Q10 (CO Q 10) 100 MG CAPS Take 300 mg by mouth at bedtime.   Yes Historical Provider, MD  etodolac (LODINE) 400 MG tablet Take 400 mg by mouth 2 (two) times daily.   Yes Historical Provider, MD  hydrochlorothiazide (HYDRODIURIL) 25 MG tablet Take 25 mg by mouth every morning.   Yes Historical Provider, MD  Multiple Vitamin (MULTIVITAMIN WITH MINERALS) TABS Take 1 tablet by mouth daily.   Yes Historical Provider, MD     Allergies  Allergen Reactions  . Penicillins Hives and Swelling    Social History:  reports that she has been smoking Cigarettes.  She has a 40 pack-year smoking history. She does not have any smokeless tobacco history on file. She reports that she drinks alcohol. She reports that she does not use illicit drugs.   History reviewed. No pertinent family history.      Physical Exam:  GEN:  Pleasant 71 year old thin Caucasian Female examined  and in no acute distress; cooperative with exam Filed Vitals:   11/05/12 2000 11/05/12 2138 11/06/12 0055 11/06/12 0148  BP:  114/63    Pulse:  113    Temp:  100.1 F (37.8 C)  100 F (37.8 C)  TempSrc:  Oral    Resp: 16 15    Height:      Weight:      SpO2:  91% 94% 93%   Blood pressure 114/63, pulse 113, temperature 100 F (37.8 C), temperature source Oral, resp. rate 15, height 5\' 2"  (1.575 m), weight 59 kg (130 lb 1.1 oz), SpO2 93.00%. PSYCH: SHe is alert and oriented x4; does not appear anxious does not appear depressed; affect is normal HEENT: Normocephalic and Atraumatic, Mucous membranes pink; PERRLA; EOM intact; Fundi:  Benign;  No scleral icterus, Nares: Patent, Oropharynx: Clear, Neck:  FROM, no  cervical lymphadenopathy nor thyromegaly or carotid bruit; no JVD; Breasts:: Not examined CHEST WALL: No tenderness CHEST: Normal respiration, clear to auscultation bilaterally HEART: Regular rate and rhythm; no murmurs rubs or gallops BACK: No kyphosis or scoliosis; no CVA tenderness ABDOMEN: Positive Bowel Sounds, soft non-tender; no masses, no organomegaly.   Rectal Exam: Not done EXTREMITIES: No bone or joint deformity; age-appropriate arthropathy of the hands and knees; no cyanosis, clubbing or edema; no ulcerations. Genitalia: not examined PULSES: 2+ and symmetric SKIN: Normal hydration  no rash or ulceration CNS: Cranial nerves 2-12 grossly intact no focal neurologic deficit    Labs on Admission:  Basic Metabolic Panel:  Lab 11/05/12 4098 11/04/12 0425 10/31/12 1125  NA 132* 131* 141  K 3.7 3.9 4.3  CL 96 97 99  CO2 27 25 29   GLUCOSE 102* 112* 90  BUN 15 16 11   CREATININE 0.70 0.83 0.69  CALCIUM 9.3 8.7 10.6*  MG -- -- --  PHOS -- -- --   Liver Function Tests: No results found for this basename: AST:5,ALT:5,ALKPHOS:5,BILITOT:5,PROT:5,ALBUMIN:5 in the last 168 hours No results found for this basename: LIPASE:5,AMYLASE:5 in the last 168 hours No results found for this basename: AMMONIA:5 in the last 168 hours CBC:  Lab 11/05/12 0603 11/04/12 0425 10/31/12 1125  WBC 14.6* 8.8 8.8  NEUTROABS -- -- --  HGB 9.8* 8.8* 13.6  HCT 29.6* 25.5* 39.6  MCV 96.4 94.4 94.1  PLT 229 204 344   Cardiac Enzymes: No results found for this basename: CKTOTAL:5,CKMB:5,CKMBINDEX:5,TROPONINI:5 in the last 168 hours BNP: No components found with this basename: POCBNP:5 CBG: No results found for this basename: GLUCAP:5 in the last 168 hours  Radiological Exams on Admission: No results found.   Time spent:  60 Minutes  Ron Parker [facility] Pager [phone]  If 7PM-7AM, please contact night-coverage [email or web address] Password [email or web address] 11/06/2012, 2:29 AM

## 2012-11-06 NOTE — Progress Notes (Signed)
ANTIBIOTIC CONSULT NOTE - INITIAL  Pharmacy Consult for Vancomycin/Levaquin/Aztreonam Indication: HCAP  Allergies  Allergen Reactions  . Penicillins Hives and Swelling    Patient Measurements: Height: 5\' 2"  (157.5 cm) Weight: 130 lb 1.1 oz (59 kg) IBW/kg (Calculated) : 50.1   Vital Signs: Temp: 98.8 F (37.1 C) (12/08 0535) Temp src: Oral (12/08 0535) BP: 121/75 mmHg (12/08 0535) Pulse Rate: 98  (12/08 0535) Intake/Output from previous day: 12/07 0701 - 12/08 0700 In: 1015 [P.O.:960; IV Piggyback:55] Out: 3450 [Urine:3450] Intake/Output from this shift:    Labs:  Basename 11/06/12 0400 11/05/12 0603 11/05/12 0602 11/04/12 0425  WBC 14.9* 14.6* -- 8.8  HGB 9.7* 9.8* -- 8.8*  PLT 212 229 -- 204  LABCREA -- -- -- --  CREATININE 0.63 -- 0.70 0.83   Estimated Creatinine Clearance: 52.5 ml/min (by C-G formula based on Cr of 0.63).  74 ml/min/1.34m2 (normalized)  Microbiology: Recent Results (from the past 720 hour(s))  SURGICAL PCR SCREEN     Status: Normal   Collection Time   10/31/12 10:44 AM      Component Value Range Status Comment   MRSA, PCR NEGATIVE  NEGATIVE Final    Staphylococcus aureus NEGATIVE  NEGATIVE Final     Medical History: Past Medical History  Diagnosis Date  . Hypertension   . Arthritis     Medications:  Scheduled:    . albuterol  2.5 mg Nebulization Q6H  . amLODipine  5 mg Oral Daily   And  . benazepril  20 mg Oral Daily  . aspirin EC  325 mg Oral BID  . atorvastatin  20 mg Oral q1800  . docusate sodium  100 mg Oral BID  . ferrous sulfate  325 mg Oral TID PC  . folic acid  1 mg Oral Daily  . furosemide  40 mg Intravenous Once  . hydrochlorothiazide  25 mg Oral q morning - 10a  . multivitamin with minerals  1 tablet Oral Daily  . potassium chloride  40 mEq Oral Once  . thiamine  100 mg Oral Daily   Or  . thiamine  100 mg Intravenous Daily  . [DISCONTINUED] OxyCODONE  20 mg Oral Q12H   Infusions:    . [DISCONTINUED] sodium  chloride 20 mL/hr at 11/04/12 1330  . [DISCONTINUED] heparin 1,000 Units/hr (11/06/12 0435)   Assessment: 71 year old female s/p right THA on 11/03/12.  [facility] consulted 12/8 for shortness of breath and confusion with hypoxemia, CT ruled out PE Beginning broad spectrum antibiotics for rule out PNA  Goal of Therapy:  Vancomycin trough level 15-20 mcg/ml  Plan:   Vancomycin 750mg  IV x 1, then 500mg  IV q12h Check trough at steady state Levaquin 750mg  IV q24h Aztreonam 1gm IV q8h Follow up renal function & cultures, de-escalation of antibiotics  Loralee Pacas, PharmD, BCPS Pager: [phone] 11/06/2012,8:26 AM

## 2012-11-06 NOTE — Progress Notes (Signed)
Physical Therapy Treatment Patient Details Name: [name] [name] MRN: [id number] DOB: [birth year] Today's Date: 11/06/2012 Time: 4098-1191 PT Time Calculation (min): 28 min  PT Assessment / Plan / Recommendation Comments on Treatment Session  Pt continues to have decreased sats with activity.  Ambulated on 4L O2 with sats dropping to 84-86%.  Provided max cues for pursed lip breathing as pt tends to mouth breath.  Practiced stairs with pt/daughter.  Discussed possible ST SNF with pt at D/C and pt/daughter state they will think about it.      Follow Up Recommendations  SNF     Does the patient have the potential to tolerate intense rehabilitation     Barriers to Discharge        Equipment Recommendations  Rolling walker with 5" wheels;3 in 1 bedside comode    Recommendations for Other Services    Frequency 7X/week   Plan Discharge plan needs to be updated    Precautions / Restrictions Precautions Precautions: None Precaution Comments: Monitor O2 Restrictions Weight Bearing Restrictions: No RLE Weight Bearing: Weight bearing as tolerated   Pertinent Vitals/Pain 6/10    Mobility  Bed Mobility Bed Mobility: Supine to Sit;Sit to Supine Supine to Sit: 4: Min assist Sit to Supine: 4: Min assist Details for Bed Mobility Assistance: Assist for RLE into and out of bed with mod cues for hand placement on bed to better simulate home environment.  Transfers Transfers: Sit to Stand;Stand to Sit Sit to Stand: 4: Min guard;From elevated surface;With upper extremity assist;From bed;From chair/3-in-1 Stand to Sit: 4: Min guard;With upper extremity assist;With armrests;To bed;To chair/3-in-1 Details for Transfer Assistance: min/guard for safety with cues for hand placement, safety and controlled descent.  Ambulation/Gait Ambulation/Gait Assistance: 4: Min guard Ambulation Distance (Feet): 40 Feet Assistive device: Rolling walker Ambulation/Gait Assistance Details: Max cues for pursed lip  breathing, sequencing/technique with RW.  Gait Pattern: Step-to pattern;Decreased stance time - right Gait velocity: decreased Stairs: Yes Stairs Assistance: 4: Min assist Stairs Assistance Details (indicate cue type and reason): Cues for sequencing/technique with RW and educated daughter on how to assist pt.  Stair Management Technique: No rails;Step to pattern;Backwards;Forwards;With walker Number of Stairs: 2     Exercises     PT Diagnosis:    PT Problem List:   PT Treatment Interventions:     PT Goals Acute Rehab PT Goals PT Goal Formulation: With patient Time For Goal Achievement: 11/11/12 Potential to Achieve Goals: Good Pt will go Supine/Side to Sit: with supervision PT Goal: Supine/Side to Sit - Progress: Progressing toward goal Pt will go Sit to Supine/Side: with supervision PT Goal: Sit to Supine/Side - Progress: Progressing toward goal Pt will go Sit to Stand: with supervision PT Goal: Sit to Stand - Progress: Progressing toward goal Pt will Ambulate: 51 - 150 feet;with supervision;with rolling walker PT Goal: Ambulate - Progress: Progressing toward goal Pt will Go Up / Down Stairs: 3-5 stairs;with supervision;with least restrictive assistive device PT Goal: Up/Down Stairs - Progress: Progressing toward goal  Visit Information  Last PT Received On: 11/06/12 Assistance Needed: +2    Subjective Data  Subjective: Something is just not right with this hip.  Patient Stated Goal: Less pain.    Cognition  Overall Cognitive Status: Appears within functional limits for tasks assessed/performed Arousal/Alertness: Awake/alert Orientation Level: Appears intact for tasks assessed Behavior During Session: [facility] for tasks performed    Balance     End of Session PT - End of Session Activity Tolerance: Patient limited by  fatigue;Patient limited by pain Patient left: in bed;with call bell/phone within reach;with family/visitor present Nurse Communication: Mobility status (O2  sats)   GP     Page, Meribeth Mattes 11/06/2012, 4:52 PM

## 2012-11-06 NOTE — Progress Notes (Signed)
Subjective: Pt breathing better this am vs last pm - appreciate Internal Med consult   Objective: Vital signs in last 24 hours: Temp:  [98.8 F (37.1 C)-100.1 F (37.8 C)] 98.8 F (37.1 C) (12/08 0535) Pulse Rate:  [98-113] 98  (12/08 0535) Resp:  [15-20] 20  (12/08 1140) BP: (114-121)/(63-75) 121/75 mmHg (12/08 0535) SpO2:  [89 %-95 %] 89 % (12/08 1140)  Intake/Output from previous day: 12/07 0701 - 12/08 0700 In: 1015 [P.O.:960; IV Piggyback:55] Out: 3450 [Urine:3450] Intake/Output this shift: Total I/O In: 285.8 [P.O.:240; I.V.:45.8] Out: 500 [Urine:500]  Exam:  Neurovascular intact Sensation intact distally Intact pulses distally Dorsiflexion/Plantar flexion intact  Labs:  Basename 11/06/12 0400 11/05/12 0603 11/04/12 0425  HGB 9.7* 9.8* 8.8*    Basename 11/06/12 0400 11/05/12 0603  WBC 14.9* 14.6*  RBC 2.94* 3.07*  HCT 27.6* 29.6*  PLT 212 229    Basename 11/06/12 0400 11/05/12 0602  NA 132* 132*  K 3.4* 3.7  CL 94* 96  CO2 27 27  BUN 11 15  CREATININE 0.63 0.70  GLUCOSE 105* 102*  CALCIUM 9.1 9.3   No results found for this basename: LABPT:2,INR:2 in the last 72 hours  Assessment/Plan: Wbc - stable - ddx infxn vs volume overload for hypoxia currently being treated for each Lung nodule needs follow up Hip doing well   Crystallee Werden SCOTT 11/06/2012, 11:49 AM

## 2012-11-07 ENCOUNTER — Other Ambulatory Visit: Payer: Self-pay

## 2012-11-07 ENCOUNTER — Encounter (HOSPITAL_COMMUNITY): Payer: Self-pay | Admitting: Nurse Practitioner

## 2012-11-07 ENCOUNTER — Inpatient Hospital Stay (HOSPITAL_COMMUNITY): Payer: Medicare Other

## 2012-11-07 DIAGNOSIS — I509 Heart failure, unspecified: Secondary | ICD-10-CM

## 2012-11-07 DIAGNOSIS — J189 Pneumonia, unspecified organism: Secondary | ICD-10-CM

## 2012-11-07 DIAGNOSIS — R0902 Hypoxemia: Secondary | ICD-10-CM

## 2012-11-07 DIAGNOSIS — Z96649 Presence of unspecified artificial hip joint: Secondary | ICD-10-CM

## 2012-11-07 DIAGNOSIS — R0602 Shortness of breath: Secondary | ICD-10-CM

## 2012-11-07 DIAGNOSIS — J9600 Acute respiratory failure, unspecified whether with hypoxia or hypercapnia: Secondary | ICD-10-CM

## 2012-11-07 DIAGNOSIS — I5031 Acute diastolic (congestive) heart failure: Secondary | ICD-10-CM

## 2012-11-07 DIAGNOSIS — D649 Anemia, unspecified: Secondary | ICD-10-CM

## 2012-11-07 DIAGNOSIS — E876 Hypokalemia: Secondary | ICD-10-CM

## 2012-11-07 LAB — BASIC METABOLIC PANEL
CO2: 29 mEq/L (ref 19–32)
Calcium: 8.8 mg/dL (ref 8.4–10.5)
Calcium: 8.7 mg/dL (ref 8.4–10.5)
Creatinine, Ser: 0.7 mg/dL (ref 0.50–1.10)
GFR calc non Af Amer: 86 mL/min — ABNORMAL LOW (ref >90)
GFR calc non Af Amer: 83 mL/min — ABNORMAL LOW (ref >90)
Glucose, Bld: 117 mg/dL — ABNORMAL HIGH (ref 70–99)
Sodium: 133 mEq/L — ABNORMAL LOW (ref 135–145)
Sodium: 130 mEq/L — ABNORMAL LOW (ref 135–145)

## 2012-11-07 LAB — BLOOD GAS, ARTERIAL
Bicarbonate: 30.3 mEq/L — ABNORMAL HIGH (ref 20.0–24.0)
O2 Saturation: 87.5 %
Patient temperature: 98.1

## 2012-11-07 LAB — CBC
MCV: 93.8 fL (ref 78.0–100.0)
Platelets: 218 10*3/uL (ref 150–400)
RBC: 2.72 MIL/uL — ABNORMAL LOW (ref 3.87–5.11)
RDW: 13.9 % (ref 11.5–15.5)
WBC: 13.5 10*3/uL — ABNORMAL HIGH (ref 4.0–10.5)

## 2012-11-07 LAB — PRO B NATRIURETIC PEPTIDE: Pro B Natriuretic peptide (BNP): 661.6 pg/mL — ABNORMAL HIGH (ref 0–125)

## 2012-11-07 MED ORDER — IPRATROPIUM BROMIDE 0.02 % IN SOLN
0.5000 mg | Freq: Four times a day (QID) | RESPIRATORY_TRACT | Status: DC
  Administered 2012-11-07 – 2012-11-09 (×9): 0.5 mg via RESPIRATORY_TRACT
  Filled 2012-11-07 (×9): qty 2.5

## 2012-11-07 MED ORDER — FUROSEMIDE 10 MG/ML IJ SOLN
40.0000 mg | Freq: Two times a day (BID) | INTRAMUSCULAR | Status: DC
  Administered 2012-11-07 – 2012-11-08 (×2): 40 mg via INTRAVENOUS
  Filled 2012-11-07 (×5): qty 4

## 2012-11-07 MED ORDER — ALBUTEROL SULFATE (5 MG/ML) 0.5% IN NEBU
2.5000 mg | INHALATION_SOLUTION | Freq: Four times a day (QID) | RESPIRATORY_TRACT | Status: DC
  Administered 2012-11-07 – 2012-11-09 (×9): 2.5 mg via RESPIRATORY_TRACT
  Filled 2012-11-07 (×9): qty 0.5

## 2012-11-07 MED ORDER — POTASSIUM CHLORIDE CRYS ER 20 MEQ PO TBCR
40.0000 meq | EXTENDED_RELEASE_TABLET | Freq: Once | ORAL | Status: AC
  Administered 2012-11-07: 40 meq via ORAL
  Filled 2012-11-07: qty 2

## 2012-11-07 MED ORDER — ENOXAPARIN SODIUM 30 MG/0.3ML ~~LOC~~ SOLN
30.0000 mg | Freq: Once | SUBCUTANEOUS | Status: AC
  Administered 2012-11-07: 30 mg via SUBCUTANEOUS
  Filled 2012-11-07: qty 0.3

## 2012-11-07 MED ORDER — BISACODYL 10 MG RE SUPP: 10.0000 mg | Freq: Every day | RECTAL | Status: DC | PRN

## 2012-11-07 MED ORDER — FUROSEMIDE 10 MG/ML IJ SOLN
40.0000 mg | Freq: Once | INTRAMUSCULAR | Status: AC
  Administered 2012-11-07: 40 mg via INTRAVENOUS

## 2012-11-07 MED ORDER — ENOXAPARIN SODIUM 30 MG/0.3ML ~~LOC~~ SOLN
30.0000 mg | Freq: Two times a day (BID) | SUBCUTANEOUS | Status: DC
  Administered 2012-11-08 – 2012-11-16 (×18): 30 mg via SUBCUTANEOUS
  Filled 2012-11-07 (×20): qty 0.3

## 2012-11-07 MED ORDER — CHLORHEXIDINE GLUCONATE 0.12 % MT SOLN
OROMUCOSAL | Status: AC
  Administered 2012-11-07: 15 mL
  Filled 2012-11-07: qty 15

## 2012-11-07 MED ORDER — FUROSEMIDE 10 MG/ML IJ SOLN
40.0000 mg | Freq: Three times a day (TID) | INTRAMUSCULAR | Status: DC
  Administered 2012-11-07: 40 mg via INTRAVENOUS
  Filled 2012-11-07 (×3): qty 4

## 2012-11-07 MED ORDER — ALBUTEROL SULFATE (5 MG/ML) 0.5% IN NEBU: 2.5000 mg | INHALATION_SOLUTION | RESPIRATORY_TRACT | Status: DC | PRN

## 2012-11-07 NOTE — Consult Note (Signed)
CARDIOLOGY CONSULT NOTE  Patient ID: [name] [name] MRN: [id number], DOB/AGE: [birth year]   Admit date: 11/03/2012 Date of Consult: 11/07/2012   Primary Physician: Benita Stabile, MD Primary Cardiologist: new  Pt. Profile  71 y/o female without prior cardiac hx who presented 12/5 for right total hip arthroplasty, whom we've been asked to eval 2/2 dyspnea with concern for CHF.   Problem List  Past Medical History  Diagnosis Date  . Hypertension   . Arthritis     a. 10/2012 s/p Right THA.  Marland Kitchen Hyperlipidemia   . Pulmonary nodule     a. 10/2012 CT: 13mm anterior LUL nodule w/ spiculated appearance - rec PET CT.    Past Surgical History  Procedure Date  . Tonsillectomy   . Total hip arthroplasty 11/03/2012    Procedure: TOTAL HIP ARTHROPLASTY ANTERIOR APPROACH;  Surgeon: Kathryne Hitch, MD;  Location: [facility];  Service: Orthopedics;  Laterality: Right;  Right Total Hip Arthroplasty, Anterior Approach (C-Arm)  . Tubal ligation     Allergies  Allergies  Allergen Reactions  . Penicillins Hives and Swelling   HPI   71 y/o female without prior cardiac hx.  She was admitted 12/5 for right total hip arthroplasty in the setting of progressive right hip pain and functional disability.  Prior to admission the patient was limited in her activities due to right hip pain. However she denied dyspnea on exertion, orthopnea, PND, palpitations, syncope or chest pain. She occasionally had mild edema in her right lower extremity. Pt says she's been somewhat sob since surgery; on 12/5 and on 12/7, she became confused in the setting of a low grade fever and O2 desaturations into the 80's requiring supplemental O2.  She was seen by IM yesterday and CT of the chest was performed to r/o PE - this was negative for PE but did show ground glass opacities and interstitial prominence with question of edema vs infx.  Pro BNP was elevated @ 1378.  She was treated with IV lasix and also placed on abx  for ? PNA. Patient otherwise denies productive cough, hemoptysis, chest pain.  Inpatient Medications     . ipratropium  0.5 mg Nebulization Q6H WA   And  . albuterol  2.5 mg Nebulization Q6H WA  . amLODipine  5 mg Oral Daily   And  . benazepril  20 mg Oral Daily  . atorvastatin  20 mg Oral q1800  . aztreonam  1 g Intravenous Q8H  . docusate sodium  100 mg Oral BID  . enoxaparin (LOVENOX) injection  30 mg Subcutaneous Q12H  . enoxaparin (LOVENOX) injection  30 mg Subcutaneous Once  . ferrous sulfate  325 mg Oral TID PC  . folic acid  1 mg Oral Daily  . [COMPLETED] furosemide  40 mg Intravenous Once  . furosemide  40 mg Intravenous TID  . levofloxacin (LEVAQUIN) IV  750 mg Intravenous Q24H  . multivitamin with minerals  1 tablet Oral Daily  . potassium chloride  40 mEq Oral Daily  . potassium chloride  40 mEq Oral Once  . thiamine  100 mg Oral Daily   Or  . thiamine  100 mg Intravenous Daily  . vancomycin  500 mg Intravenous Q12H  . [DISCONTINUED] albuterol  2.5 mg Nebulization Q6H  . [DISCONTINUED] aspirin EC  325 mg Oral BID  . [DISCONTINUED] furosemide  40 mg Intravenous BID  . [DISCONTINUED] heparin subcutaneous  5,000 Units Subcutaneous Q8H  . [DISCONTINUED] hydrochlorothiazide  25 mg Oral  q morning - 10a  . [DISCONTINUED] ipratropium  0.5 mg Nebulization Q6H    Family History Family History  Problem Relation Age of Onset  . CAD Father      Social History History   Social History  . Marital Status: Divorced    Spouse Name: N/A    Number of Children: N/A  . Years of Education: N/A   Occupational History  . Not on file.   Social History Main Topics  . Smoking status: Current Every Day Smoker -- 1.0 packs/day for 40 years    Types: Cigarettes  . Smokeless tobacco: Not on file  . Alcohol Use: Yes     Comment: 2-3 drinks of liquor nightly  . Drug Use: No  . Sexually Active:    Other Topics Concern  . Not on file   Social History Narrative  . No  narrative on file     Review of Systems  General:  No chills, fever, night sweats or weight changes.  Cardiovascular:  No chest pain or palpitations. Dermatological: No rash, lesions/masses Respiratory: No cough, dyspnea Urologic: No hematuria, dysuria Abdominal:   No nausea, vomiting, diarrhea, bright red blood per rectum, melena, or hematemesis Neurologic:  No visual changes, wkns, changes in mental status. Patient does complain of pain in right hip and right lower extremity status post surgery. All other systems reviewed and are otherwise negative except as noted above.  Physical Exam  Blood pressure 118/64, pulse 109, temperature 99 F (37.2 C), temperature source Oral, resp. rate 26, height 5\' 2"  (1.575 m), weight 130 lb 1.1 oz (59 kg), SpO2 93.00%.  General: Pleasant, NAD Psych: Normal affect. Neuro: Alert and oriented X 3. Moves all extremities spontaneously. HEENT: Normal  Neck: Supple without bruits or JVD. Lungs:  Diffuse crackles greatest in the mid and upper lobes. Heart: RRR no s3, s4; 2/6 systolic murmur apex. Abdomen: Soft, non-tender, non-distended, BS + x 4.  Extremities: status post right hip surgery. 1-2+ edema right lower extremity. No edema left lower extremity. Distal pulses are diminished.   Labs   [facility] 11/06/12 2155 11/06/12 1615 11/06/12 1050  CKTOTAL -- -- --  CKMB -- -- --  TROPONINI <0.30 <0.30 <0.30   Lab Results  Component Value Date   WBC 13.5* 11/07/2012   HGB 8.7* 11/07/2012   HCT 25.5* 11/07/2012   MCV 93.8 11/07/2012   PLT 218 11/07/2012     Lab 11/07/12 0434 11/06/12 0400  NA 133* --  K 2.9* --  CL 92* --  CO2 29 --  BUN 11 --  CREATININE 0.70 --  CALCIUM 8.8 --  PROT -- 5.7*  BILITOT -- 0.6  ALKPHOS -- 61  ALT -- 14  AST -- 41*  GLUCOSE 138* --   Radiology/Studies  Ct Angio Chest Pe W/cm &/or Wo Cm  11/06/2012  *RADIOLOGY REPORT*  Clinical Data: Low O2 sats.  Elevated white count.  Post surgery.  CT ANGIOGRAPHY CHEST   Technique:  Multidetector CT imaging of the chest using the standard protocol during bolus administration of intravenous contrast. Multiplanar reconstructed images including MIPs were obtained and reviewed to evaluate the vascular anatomy.  Contrast: OMNIPAQUE IOHEXOL 350 MG/ML SOLN  Comparison: Chest x-ray earlier today.  Findings: No filling defects in the pulmonary arteries to suggest pulmonary emboli.  There is mild cardiomegaly.  Small pericardial effusion.  No pleural effusions.  Ground-glass opacities and interstitial prominence throughout the lungs corresponding to abnormality on chest x-ray.  This is  likely infectious or inflammatory or edema. Edema is felt less likely given the mid and upper lung zone predominance of the findings. Lung bases are relatively spared.  13 mm nodule in the anterior left upper lobe on image 21.  This is somewhat spiculated appearance and concerning for neoplasm.  Mild COPD changes.  Borderline sized mediastinal lymph nodes.  No hilar or axillary adenopathy.  Coronary artery calcifications are present.  Visualized thyroid and chest wall soft tissues unremarkable. Imaging into the upper abdomen shows no acute findings.  IMPRESSION: Cardiomegaly.  Ground-glass opacities and interstitial prominence throughout the lungs, most pronounced in the mid and upper lung zones. Differential considerations would include infectious or inflammatory pneumonitis or less likely edema.  Coronary artery disease.  13 mm anterior left upper lobe nodule with spiculated appearance. Cannot exclude malignancy. Consider further evaluation with PET CT when acute symptoms resolve.  Borderline sized mediastinal lymph nodes.   Original Report Authenticated By: Charlett Nose, M.D.     Dg Chest Port 1 View  11/07/2012  *RADIOLOGY REPORT*  Clinical Data: Dyspnea.  Increased oxygen requirement.  PORTABLE CHEST - 1 VIEW  Comparison: 11/06/2012  Findings: Shallow inspiration.  Mild cardiac enlargement.   Diffuse bilateral parenchymal infiltrates consistent with pneumonia or edema.  No pleural effusions.  No pneumothorax.  No significant change since previous study.  IMPRESSION: Stable appearance of diffuse bilateral pulmonary infiltrates.   Original Report Authenticated By: Burman Nieves, M.D.    Dg Chest Port 1 View  11/06/2012  *RADIOLOGY REPORT*  Clinical Data: Postop.  Question pneumonia.  PORTABLE CHEST - 1 VIEW  Comparison: 10/31/2012  Findings: There are low lung volumes.  Diffuse interstitial prominence of the lungs.  Somewhat confluent opacity noted in the right upper lobe.  Mild cardiomegaly.  No effusions.  No acute bony abnormality.  IMPRESSION: Diffuse interstitial prominence with more focal opacity in the right upper lobe.  This could represent edema or infection. Mild cardiomegaly.   Original Report Authenticated By: Charlett Nose, M.D.    ECG  ST, 113, LVH, nonspecific st/t changes - limb lead reversal.  ASSESSMENT AND PLAN #1 acute diastolic CHF-the patient is hypoxic and has interstitial infiltrates on chest x-ray and chest CT. Her chest CT showed no pulmonary embolus. It does show left upper lobe nodule concerning for malignancy. I am not convinced this presentation is completely related to heart failure. She does not have significant peripheral edema and I do not find her neck veins elevated. I agree with gentle diuresis but I will change her Lasix to 40 mg twice a day. Follow potassium and renal function and this dose may need to be reduced tomorrow morning. Continue antibiotics (patient also with mild fever and elevated WBC). She would most likely benefit from a pulmonary consult. Plan to await echocardiogram to assess LV function. She also has a mitral regurgitation murmur on echocardiogram. Finally her chest CT does show coronary calcification and when she recovers from her acute illness she would benefit from a functional study which can be performed as an outpatient. #2 lung  mass-she will need a pulmonary evaluation. #3 status post right hip surgery-management per orthopedics. #4 tobacco abuse-patient needs to discontinue. #5 acute blood loss anemia-follow hemoglobin and transfuse as needed.   Signed, Olga Millers, MD 11/07/2012, 10:57 AM

## 2012-11-07 NOTE — Consult Note (Signed)
PULMONARY  / CRITICAL CARE MEDICINE  Name: [name] [name] MRN: [id number] DOB: 08-07-1941    LOS: 4  REFERRING MD :  TRH  CHIEF COMPLAINT:  Acute respiratory distress  BRIEF PATIENT DESCRIPTION: 71 yo smoker without history of lung disease s/p elective total hip arthroplasty on 12/5, developed hypoxia on 12/8.  CT angio was negative for PE but showed bilateral asymmetric ground glass opacities. BNP 1378 - diuresed, but developed progressive hypoxia. PCCM consulted on 12/9, BiPAP started. TTE - nml LVEF, no WMA.  LINES / TUBES: NA  CULTURES: NA  ANTIBIOTICS: Aztreonam 12/8 >>> Vancomycin 12/8 >>> Levaquin 12/8 >>>  SIGNIFICANT EVENTS:  12/5  Elective total hip arthroplasty. 12/8  Developed hypoxia, abx began, diuresed. 12/9  TTE - LVF wnl.  Progressive hypoxia, NRB, BiPAP.  The patient is mechanically ventialted and unable to provide history, which was obtained for available medical records.  PAST MEDICAL HISTORY :  Past Medical History  Diagnosis Date  . Hypertension   . Arthritis     a. 10/2012 s/p Right THA.  Marland Kitchen Hyperlipidemia   . Pulmonary nodule     a. 10/2012 CT: 13mm anterior LUL nodule w/ spiculated appearance - rec PET CT.   Past Surgical History  Procedure Date  . Tonsillectomy   . Total hip arthroplasty 11/03/2012    Procedure: TOTAL HIP ARTHROPLASTY ANTERIOR APPROACH;  Surgeon: Kathryne Hitch, MD;  Location: [facility];  Service: Orthopedics;  Laterality: Right;  Right Total Hip Arthroplasty, Anterior Approach (C-Arm)  . Tubal ligation    Prior to Admission medications   Medication Sig Start Date End Date Taking? Authorizing Provider  amLODipine-benazepril (LOTREL) 5-20 MG per capsule Take 1 capsule by mouth every morning.   Yes Historical Provider, MD  atorvastatin (LIPITOR) 20 MG tablet Take 20 mg by mouth daily before breakfast.    Yes Historical Provider, MD  carboxymethylcellulose (REFRESH TEARS) 0.5 % SOLN Place 1 drop into both eyes daily as  needed. For dry eyes   Yes Historical Provider, MD  Coenzyme Q10 (CO Q 10) 100 MG CAPS Take 300 mg by mouth at bedtime.   Yes Historical Provider, MD  etodolac (LODINE) 400 MG tablet Take 400 mg by mouth 2 (two) times daily.   Yes Historical Provider, MD  hydrochlorothiazide (HYDRODIURIL) 25 MG tablet Take 25 mg by mouth every morning.   Yes Historical Provider, MD  Multiple Vitamin (MULTIVITAMIN WITH MINERALS) TABS Take 1 tablet by mouth daily.   Yes Historical Provider, MD   Allergies  Allergen Reactions  . Penicillins Hives and Swelling   FAMILY HISTORY:  Family History  Problem Relation Age of Onset  . CAD Father    SOCIAL HISTORY:  reports that she has been smoking Cigarettes.  She has a 40 pack-year smoking history. She does not have any smokeless tobacco history on file. She reports that she drinks alcohol. She reports that she does not use illicit drugs.  REVIEW OF SYSTEMS:  Unable to provide.  VITAL SIGNS: Temp:  [97.9 F (36.6 C)-99.4 F (37.4 C)] 98.1 F (36.7 C) (12/09 2000) Pulse Rate:  [99-111] 104  (12/09 2020) Resp:  [24-34] 31  (12/09 2020) BP: (105-121)/(64-92) 117/92 mmHg (12/09 1800) SpO2:  [86 %-99 %] 86 % (12/09 2020) FiO2 (%):  [50 %-100 %] 100 % (12/09 1014) Weight:  [60.7 kg (133 lb 13.1 oz)] 60.7 kg (133 lb 13.1 oz) (12/09 1208)  HEMODYNAMICS:    VENTILATOR SETTINGS: Vent Mode:  [-]  FiO2 (%):  [  50 %-100 %] 100 %  INTAKE / OUTPUT: Intake/Output      12/09 0701 - 12/10 0700   P.O. 660   I.V. (mL/kg)    IV Piggyback 350   Total Intake(mL/kg) 1010 (16.6)   Urine (mL/kg/hr) 750 (0.8)   Total Output 750   Net +260       Urine Occurrence 3 x   Stool Occurrence 3 x     PHYSICAL EXAMINATION: General:  Im mild distress, tachypnea, wearing BiPAP Neuro:  Awake, alert, oriented HEENT:  PERRL Cardiovascular:  Irregular rhythm, no murmurs Lungs:  Bilateral diminished air entry, diffuse rales Abdomen:  Soft, nontender Musculoskeletal:  Moves all  extremities, no edema Skin:  Intact  LABS:  Lab 11/07/12 2222 11/07/12 0434 11/06/12 2155 11/06/12 1615 11/06/12 1050 11/06/12 0400 11/05/12 0603 11/05/12 0602 11/04/12 0425  HGB -- 8.7* -- -- -- 9.7* 9.8* -- 8.8*  WBC -- 13.5* -- -- -- 14.9* 14.6* -- 8.8  PLT -- 218 -- -- -- 212 229 -- --  NA -- 133* -- -- -- 132* -- 132* 131*  K -- 2.9* -- -- -- 3.4* -- -- --  CL -- 92* -- -- -- 94* -- 96 97  CO2 -- 29 -- -- -- 27 -- 27 25  GLUCOSE -- 138* -- -- -- 105* -- 102* 112*  BUN -- 11 -- -- -- 11 -- 15 16  CREATININE -- 0.70 -- -- -- 0.63 -- 0.70 0.83  CALCIUM -- 8.8 -- -- -- 9.1 -- 9.3 8.7  MG -- -- -- -- -- -- -- -- --  PHOS -- -- -- -- -- -- -- -- --  AST -- -- -- -- -- 41* -- -- --  ALT -- -- -- -- -- 14 -- -- --  ALKPHOS -- -- -- -- -- 61 -- -- --  BILITOT -- -- -- -- -- 0.6 -- -- --  PROT -- -- -- -- -- 5.7* -- -- --  ALBUMIN -- -- -- -- -- 2.8* -- -- --  INR -- -- -- -- -- -- -- -- --  APTT -- -- -- -- -- -- -- -- --  INR -- -- -- -- -- -- -- -- --  LATICACIDVEN -- -- -- -- -- -- -- -- --  TROPONINI -- -- <0.30 <0.30 <0.30 -- -- -- --  PHART 7.506* -- -- -- -- -- -- -- --  PCO2ART 38.4 -- -- -- -- -- -- -- --  PO2ART 50.7* -- -- -- -- -- -- -- --  HCO3 30.3* -- -- -- -- -- -- -- --  O2SAT 87.5 -- -- -- -- -- -- -- --   IMAGING: 12/8  Chest CT >>> No PE, GGO / upper lobes predominant, LUL nodule  ECG: 12/8 >>> LVH, NSR  DIAGNOSES: Principal Problem:  *Hypoxemia Active Problems:  Degenerative arthritis of hip  Confusion  Fever  S/P total hip arthroplasty  CHF (congestive heart failure)  HCAP (healthcare-associated pneumonia)  Acute respiratory failure  Normocytic anemia  Hypokalemia  Acute diastolic heart failure   ASSESSMENT / PLAN:  PULMONARY  A: Acute respiratory failure, hypoxemic (DDx:  Pneumonitis, HCAP, Pulmonary edema, NSIP, RB-ILD, HP). P:   BiPAP Goal SpO2>90, pH>7.30 If WOB persists, may require intubation Consider pulse steroids  empirically ESR, CRP, RF, ANCA, Anti DS DNA  CARDIOVASCULAR  A: Acute diastolic CHF. P:  Continue Lasix  RENAL  A:  Hypokalemia P:   Replete &  recheck  GASTROINTESTINAL  A:  No active issues. P:   NPO as may need intubation  INFECTIOUS  A:  Suspected HCAP.  PCN allergy. P:   Antibiotics as above PCT Urine Strep / Legionella Ag  I have personally obtained a history, examined the patient, evaluated laboratory and imaging results, formulated the assessment and plan and placed orders.  CRITICAL CARE: The patient is critically ill with multiple organ systems failure and requires high complexity decision making for assessment and support, frequent evaluation and titration of therapies, application of advanced monitoring technologies and extensive interpretation of multiple databases. Critical Care Time devoted to patient care services described in this note is 45 minutes.   Lonia Farber, MD  Pulmonary and Critical Care Medicine [facility] Pager: [phone]  11/07/2012, 10:11 PM

## 2012-11-07 NOTE — Progress Notes (Signed)
Subjective: 4 Days Post-Op Procedure(s) (LRB): TOTAL HIP ARTHROPLASTY ANTERIOR APPROACH (Right) Patient reports pain as mild.  Appreciate [facility] following patient due to lung/O2 issues. Does have acute blood loss anemia, but no transfusion needed.  Decreased potassium due to lasix given to try to get fluid off of lungs.  Objective: Vital signs in last 24 hours: Temp:  [99 F (37.2 C)-100 F (37.8 C)] 99 F (37.2 C) (12/09 0522) Pulse Rate:  [60-109] 109  (12/09 0522) Resp:  [20-24] 24  (12/09 0522) BP: (101-118)/(45-64) 118/64 mmHg (12/09 0522) SpO2:  [89 %-97 %] 90 % (12/09 0522) FiO2 (%):  [50 %] 50 % (12/09 0223)  Intake/Output from previous day: 12/08 0701 - 12/09 0700 In: 875.8 [P.O.:480; I.V.:45.8; IV Piggyback:350] Out: 3550 [Urine:3550] Intake/Output this shift:     Basename 11/07/12 0434 11/06/12 0400 11/05/12 0603  HGB 8.7* 9.7* 9.8*    Basename 11/07/12 0434 11/06/12 0400  WBC 13.5* 14.9*  RBC 2.72* 2.94*  HCT 25.5* 27.6*  PLT 218 212    Basename 11/07/12 0434 11/06/12 0400  NA 133* 132*  K 2.9* 3.4*  CL 92* 94*  CO2 29 27  BUN 11 11  CREATININE 0.70 0.63  GLUCOSE 138* 105*  CALCIUM 8.8 9.1   No results found for this basename: LABPT:2,INR:2 in the last 72 hours  Neurovascular intact Sensation intact distally Intact pulses distally Dorsiflexion/Plantar flexion intact Incision: scant drainage  Assessment/Plan: 4 Days Post-Op Procedure(s) (LRB): TOTAL HIP ARTHROPLASTY ANTERIOR APPROACH (Right) Up with therapy. Start lovenox. Will keep her here until lungs improve.  Korinna Tat Y 11/07/2012, 7:35 AM

## 2012-11-07 NOTE — Progress Notes (Signed)
Pt requesting to take off bipap and sats drop with any activity. Discussed in depth the need to keep bipap on and ativan 0.5mg  given to help with anxiety. Dr. Marin Shutter on the unit and discussed the alternative of having to be intubated. Pt appears to be resting well with ativan.

## 2012-11-07 NOTE — Progress Notes (Signed)
[facility] PROGRESS NOTE  Robynne Roat ZOX:[id number] DOB: [birth year] DOA: 11/03/2012 PCP: Benita Stabile, MD  Assessment/Plan: Principal Problem:  *Hypoxemia Active Problems:  Degenerative arthritis of hip  SOB (shortness of breath)  Confusion  Fever  S/P total hip arthroplasty  CHF (congestive heart failure)    1. Hypoxia: Patient was noted to have oxygen desaturation into the 80s on 11/05/12, without chest pain, SOB or cough. CTA done to evaluate for possible PE, fortunately was negative for PE, but did reveal bilateral asymmetric ground glass opacities and interstitial prominence, CAD and cardiomegaly. Auscultatory findings elicited bilateral diffuse crackles. Patient is a febrile, has no cough or chest pain, although wcc is elevated, and ProBNP is 1378. I favor a diagnosis of fluid overload/CHF, and have placed patient on iv Lasix. Following ProBNP, and this is trending down. IV Heparin was been discontinued on 11/06/12. Have empirically covered for HCAP with Vanc/Aztreonam/Levaquin, and supportive treatment continues. 12-lead EKG shows no acute ischemic changes, and cardiac enzymes are unelevated. 2D Echocardiogram is pending. Today, patient is still hypoxic, saturating in the 80s, despite venti-mask. Repeat CXR showed persistent diffuse airway disease. In view of persistent hypoxia/Acute respiratory failure, will transfer patient to SDU. Will also consult cardiology.  2. AMS: Patient had transient confusion on 11/05/12. This was likely secondary to hypoxia. Mental status is at now at baseline.  3. Tobacco Abuse: Patent smokes rather heavily, about half-one pack of cigarettes per day, since age 85 years. Have counseled her, but she seems reluctant to quit. Apparently, she does not tolerate Nicoderm patch.  4. Pulmonary nodule: A 13 mm LUL spiculated nodule was incidentally noted on CTA. Pulmonary consult will be necessary, after acute problems have stabilized.  5. HTN: This is  controlled.  6. Endstage OA right hip: Patient is s/p right THA, POD# 3. manage per primary service.    Code Status: Full Code. Family Communication: Disposition Plan: To be determined. On CIWA Protocol for possible ETOH Withdrawal Sxs.    Brief narrative: 72 y.o. Female with known history of HTN, OA, who underwent a Right Hip THA due to severe DJD on 11/03/12 by Dr. Doneen Poisson. On 11/05/12 she began to have confusion and desaturations into the 80's and began to run a low grade fever. She was placed on 4 liters of [place] O2 and had improvement of her O2 saturations to 94%. A Medical Consultation was requested by Dr. Dorene Grebe due to the hypoxemia and confusion.   Consultants:  Medical-[facility].  Ortho/Primary-Dr Magnus Ivan.  Procedures:  CTA.   Antibiotics:  Vancomycin 11/06/12>>>  Levaquin 11/06/12>>>  Aztreonam 11/06/12>>>  HPI/Subjective: Lucid, no chest pain, talking in complete sentences. .   Objective: Vital signs in last 24 hours: Temp:  [99 F (37.2 C)-100 F (37.8 C)] 99 F (37.2 C) (12/09 0522) Pulse Rate:  [60-109] 109  (12/09 0522) Resp:  [20-24] 24  (12/09 0522) BP: (101-118)/(45-64) 118/64 mmHg (12/09 0522) SpO2:  [89 %-97 %] 90 % (12/09 0522) FiO2 (%):  [50 %] 50 % (12/09 0223) Weight change:  Last BM Date: 11/02/12  Intake/Output from previous day: 12/08 0701 - 12/09 0700 In: 875.8 [P.O.:480; I.V.:45.8; IV Piggyback:350] Out: 3550 [Urine:3550]     Physical Exam: General: Alert, communicative, fully oriented, not short of breath at rest.  HEENT:  Mild clinical pallor, no jaundice, no conjunctival injection or discharge.  NECK:  Supple, JVP not seen, no carotid bruits, no palpable lymphadenopathy, no palpable goiter. CHEST:  Bilateral diffuse crackles, no wheeze. Marland Kitchen HEART:  Sounds 1 and 2 heard, normal, regular, no murmurs. ABDOMEN:  Moderately obese, soft, non-tender, no palpable organomegaly, no palpable masses, normal bowel sounds. GENITALIA:   Not examined. LOWER EXTREMITIES:  No pitting edema, palpable peripheral pulses. MUSCULOSKELETAL SYSTEM:  Generalized osteoarthritic changes. CENTRAL NERVOUS SYSTEM:  No focal neurologic deficit on gross examination.  Lab Results:  Basename 11/07/12 0434 11/06/12 0400  WBC 13.5* 14.9*  HGB 8.7* 9.7*  HCT 25.5* 27.6*  PLT 218 212    Basename 11/07/12 0434 11/06/12 0400  NA 133* 132*  K 2.9* 3.4*  CL 92* 94*  CO2 29 27  GLUCOSE 138* 105*  BUN 11 11  CREATININE 0.70 0.63  CALCIUM 8.8 9.1   Recent Results (from the past 240 hour(s))  SURGICAL PCR SCREEN     Status: Normal   Collection Time   10/31/12 10:44 AM      Component Value Range Status Comment   MRSA, PCR NEGATIVE  NEGATIVE Final    Staphylococcus aureus NEGATIVE  NEGATIVE Final      Studies/Results: Ct Angio Chest Pe W/cm &/or Wo Cm  11/06/2012  *RADIOLOGY REPORT*  Clinical Data: Low O2 sats.  Elevated white count.  Post surgery.  CT ANGIOGRAPHY CHEST  Technique:  Multidetector CT imaging of the chest using the standard protocol during bolus administration of intravenous contrast. Multiplanar reconstructed images including MIPs were obtained and reviewed to evaluate the vascular anatomy.  Contrast: OMNIPAQUE IOHEXOL 350 MG/ML SOLN  Comparison: Chest x-ray earlier today.  Findings: No filling defects in the pulmonary arteries to suggest pulmonary emboli.  There is mild cardiomegaly.  Small pericardial effusion.  No pleural effusions.  Ground-glass opacities and interstitial prominence throughout the lungs corresponding to abnormality on chest x-ray.  This is likely infectious or inflammatory or edema. Edema is felt less likely given the mid and upper lung zone predominance of the findings. Lung bases are relatively spared.  13 mm nodule in the anterior left upper lobe on image 21.  This is somewhat spiculated appearance and concerning for neoplasm.  Mild COPD changes.  Borderline sized mediastinal lymph nodes.  No hilar or  axillary adenopathy.  Coronary artery calcifications are present.  Visualized thyroid and chest wall soft tissues unremarkable. Imaging into the upper abdomen shows no acute findings.  IMPRESSION: Cardiomegaly.  Ground-glass opacities and interstitial prominence throughout the lungs, most pronounced in the mid and upper lung zones. Differential considerations would include infectious or inflammatory pneumonitis or less likely edema.  Coronary artery disease.  13 mm anterior left upper lobe nodule with spiculated appearance. Cannot exclude malignancy. Consider further evaluation with PET CT when acute symptoms resolve.  Borderline sized mediastinal lymph nodes.   Original Report Authenticated By: Charlett Nose, M.D.    Dg Chest Port 1 View  11/07/2012  *RADIOLOGY REPORT*  Clinical Data: Dyspnea.  Increased oxygen requirement.  PORTABLE CHEST - 1 VIEW  Comparison: 11/06/2012  Findings: Shallow inspiration.  Mild cardiac enlargement.  Diffuse bilateral parenchymal infiltrates consistent with pneumonia or edema.  No pleural effusions.  No pneumothorax.  No significant change since previous study.  IMPRESSION: Stable appearance of diffuse bilateral pulmonary infiltrates.   Original Report Authenticated By: Burman Nieves, M.D.    Dg Chest Port 1 View  11/06/2012  *RADIOLOGY REPORT*  Clinical Data: Postop.  Question pneumonia.  PORTABLE CHEST - 1 VIEW  Comparison: 10/31/2012  Findings: There are low lung volumes.  Diffuse interstitial prominence of the lungs.  Somewhat confluent opacity noted in the right  upper lobe.  Mild cardiomegaly.  No effusions.  No acute bony abnormality.  IMPRESSION: Diffuse interstitial prominence with more focal opacity in the right upper lobe.  This could represent edema or infection. Mild cardiomegaly.   Original Report Authenticated By: Charlett Nose, M.D.     Medications: Scheduled Meds:    . ipratropium  0.5 mg Nebulization Q6H WA   And  . albuterol  2.5 mg Nebulization Q6H WA   . amLODipine  5 mg Oral Daily   And  . benazepril  20 mg Oral Daily  . atorvastatin  20 mg Oral q1800  . aztreonam  1 g Intravenous Q8H  . docusate sodium  100 mg Oral BID  . enoxaparin (LOVENOX) injection  30 mg Subcutaneous Q12H  . enoxaparin (LOVENOX) injection  30 mg Subcutaneous Once  . ferrous sulfate  325 mg Oral TID PC  . folic acid  1 mg Oral Daily  . furosemide  40 mg Intravenous BID  . [COMPLETED] furosemide  40 mg Intravenous Once  . levofloxacin (LEVAQUIN) IV  750 mg Intravenous Q24H  . multivitamin with minerals  1 tablet Oral Daily  . [COMPLETED] potassium chloride  40 mEq Oral Once  . potassium chloride  40 mEq Oral Daily  . potassium chloride  40 mEq Oral Once  . thiamine  100 mg Oral Daily   Or  . thiamine  100 mg Intravenous Daily  . vancomycin  500 mg Intravenous Q12H  . [COMPLETED] vancomycin  750 mg Intravenous Once  . [DISCONTINUED] albuterol  2.5 mg Nebulization Q6H  . [DISCONTINUED] albuterol  2.5 mg Nebulization Q6H  . [DISCONTINUED] aspirin EC  325 mg Oral BID  . [DISCONTINUED] heparin subcutaneous  5,000 Units Subcutaneous Q8H  . [DISCONTINUED] hydrochlorothiazide  25 mg Oral q morning - 10a  . [DISCONTINUED] ipratropium  0.5 mg Nebulization Q6H   Continuous Infusions:  PRN Meds:.acetaminophen, acetaminophen, albuterol, alum & mag hydroxide-simeth, bisacodyl, diphenhydrAMINE, HYDROcodone-acetaminophen, LORazepam, LORazepam, menthol-cetylpyridinium, methocarbamol (ROBAXIN) IV, methocarbamol, metoCLOPramide (REGLAN) injection, metoCLOPramide, ondansetron (ZOFRAN) IV, ondansetron, phenol, polyvinyl alcohol, zolpidem, [DISCONTINUED] albuterol    LOS: 4 days   Laurali Goddard,CHRISTOPHER  [facility] Pager [phone]. If 8PM-8AM, please contact night-coverage at [email or web address], password [facility] 11/07/2012, 9:22 AM  LOS: 4 days

## 2012-11-07 NOTE — Progress Notes (Signed)
Pt to transfer to [facility] room 1236. Report given to Lavina.

## 2012-11-07 NOTE — Progress Notes (Signed)
eLink Physician-Brief Progress Note Patient Name: [name] [name] DOB: 1941/11/14 MRN: [id number]  Date of Service  11/07/2012   HPI/Events of Note     eICU Interventions  bipap for wob & desatn on  NRB Lasix IV, chk BMET & ABG   Intervention Category Major Interventions: Respiratory failure - evaluation and management  [name] [name] V. 11/07/2012, 10:08 PM

## 2012-11-07 NOTE — Progress Notes (Signed)
  Echocardiogram 2D Echocardiogram has been performed.  Cathie Beams 11/07/2012, 11:36 AM

## 2012-11-07 NOTE — Progress Notes (Signed)
CARE MANAGEMENT NOTE 11/07/2012  Patient:  DorothyKelly   Account Number:  [id number]  Date Initiated:  11/07/2012  Documentation initiated by:  Colleen Can  Subjective/Objective Assessment:   dx s/p total hip atrhoplasty rt-anterior approach. Developed shortness of breath with low O2 sats. Ttransferred to stepdown unit 12/09./2013. icu/stepdown CM advised.     Action/Plan:   Recommendations for ST SNF 12/08   Anticipated DC Date:  11/10/2012   Anticipated DC Plan:  SKILLED NURSING FACILITY  In-house referral  Clinical Social Worker      DC Planning Services  CM consult      status of service:  In process, will continue to follow

## 2012-11-07 NOTE — Progress Notes (Signed)
Physical Therapy Treatment Patient Details Name: Catlynn Grondahl MRN: [id number] DOB: 1941/07/31 Today's Date: 11/07/2012 Time: 5621-3086 PT Time Calculation (min): 31 min  PT Assessment / Plan / Recommendation Comments on Treatment Session  Pt now in ICU on non rebreather mask at 15L.  Ambulated pt in room only due to respiratory status.  Ambulated to foot of bed to 3in1 with HR up to 122 and O2 sats dropped to 88-89%.  Then ambulated to recliner on other side of bed with sats the same.  RN notified.  Continue to recommend ST SNF at D/C to maximize safety.     Follow Up Recommendations  SNF     Does the patient have the potential to tolerate intense rehabilitation     Barriers to Discharge        Equipment Recommendations  Rolling walker with 5" wheels    Recommendations for Other Services    Frequency 7X/week   Plan Discharge plan remains appropriate    Precautions / Restrictions Precautions Precautions: None Precaution Comments: monitor O2, pt now on non-rebreather mask Restrictions Weight Bearing Restrictions: No RLE Weight Bearing: Weight bearing as tolerated   Pertinent Vitals/Pain Pt did not have any c/o pain during session.  Did note that RR increased to 30's during session.  RN notified.     Mobility  Bed Mobility Bed Mobility: Supine to Sit Supine to Sit: 4: Min assist;3: Mod assist;HOB elevated Details for Bed Mobility Assistance: Assist for RLE out of bed and some assist for trunk to ensure safety with cues for hand placement on bed to scoot hips to EOB.   Transfers Transfers: Sit to Stand;Stand to Sit Sit to Stand: 4: Min guard;From elevated surface;With upper extremity assist;From bed;From chair/3-in-1 Stand to Sit: 4: Min guard;With upper extremity assist;With armrests;To chair/3-in-1 Details for Transfer Assistance: Performed x 2 in order to use 3in1 in restroom.  Cues for hand placement and safety due to pt somewhat impulsive to stand without PT being  ready.  Ambulation/Gait Ambulation/Gait Assistance: 4: Min assist;4: Min Government social research officer (Feet): 6 Feet (then 6' more) Assistive device: Rolling walker Ambulation/Gait Assistance Details: Min cues for negotiating RW in room due to lines and leads.  Gait Pattern: Step-to pattern;Decreased stance time - right Gait velocity: decreased Stairs: No    Exercises     PT Diagnosis:    PT Problem List:   PT Treatment Interventions:     PT Goals Acute Rehab PT Goals PT Goal Formulation: With patient Time For Goal Achievement: 11/11/12 Potential to Achieve Goals: Good Pt will go Supine/Side to Sit: with supervision PT Goal: Supine/Side to Sit - Progress: Progressing toward goal Pt will go Sit to Stand: with supervision PT Goal: Sit to Stand - Progress: Progressing toward goal Pt will Ambulate: 51 - 150 feet;with supervision;with rolling walker PT Goal: Ambulate - Progress: Progressing toward goal  Visit Information  Last PT Received On: 11/07/12 Assistance Needed: +2    Subjective Data  Subjective: I'm not happy to be down here (pt now in ICU) Patient Stated Goal: Less pain.    Cognition  Overall Cognitive Status: Appears within functional limits for tasks assessed/performed Arousal/Alertness: Awake/alert Orientation Level: Appears intact for tasks assessed Behavior During Session: [facility] for tasks performed    Balance     End of Session PT - End of Session Equipment Utilized During Treatment: Oxygen (non rebreather mask) Activity Tolerance: Patient limited by fatigue Patient left: in chair;with call bell/phone within reach Nurse Communication: Mobility status  GP     Page, Meribeth Mattes 11/07/2012, 4:26 PM

## 2012-11-08 ENCOUNTER — Inpatient Hospital Stay (HOSPITAL_COMMUNITY): Payer: Medicare Other

## 2012-11-08 DIAGNOSIS — I959 Hypotension, unspecified: Secondary | ICD-10-CM

## 2012-11-08 LAB — CBC
MCH: 32.3 pg (ref 26.0–34.0)
MCHC: 35.2 g/dL (ref 30.0–36.0)
Platelets: 265 10*3/uL (ref 150–400)
RDW: 13.5 % (ref 11.5–15.5)

## 2012-11-08 LAB — BLOOD GAS, ARTERIAL
Bicarbonate: 30.7 mEq/L — ABNORMAL HIGH (ref 20.0–24.0)
Drawn by: 295031
FIO2: 1 %
PEEP: 5 cmH2O
RATE: 16 resp/min
pCO2 arterial: 51.9 mmHg — ABNORMAL HIGH (ref 35.0–45.0)
pO2, Arterial: 200 mmHg — ABNORMAL HIGH (ref 80.0–100.0)

## 2012-11-08 LAB — BASIC METABOLIC PANEL
BUN: 18 mg/dL (ref 6–23)
Calcium: 8.7 mg/dL (ref 8.4–10.5)
Creatinine, Ser: 0.91 mg/dL (ref 0.50–1.10)
GFR calc Af Amer: 73 mL/min — ABNORMAL LOW (ref >90)
GFR calc non Af Amer: 83 mL/min — ABNORMAL LOW (ref >90)
GFR calc non Af Amer: 63 mL/min — ABNORMAL LOW (ref >90)
Potassium: 3.7 mEq/L (ref 3.5–5.1)
Sodium: 132 mEq/L — ABNORMAL LOW (ref 135–145)

## 2012-11-08 LAB — MAGNESIUM: Magnesium: 1.1 mg/dL — ABNORMAL LOW (ref 1.5–2.5)

## 2012-11-08 LAB — PRO B NATRIURETIC PEPTIDE: Pro B Natriuretic peptide (BNP): 1066 pg/mL — ABNORMAL HIGH (ref 0–125)

## 2012-11-08 LAB — PHOSPHORUS: Phosphorus: 3.3 mg/dL (ref 2.3–4.6)

## 2012-11-08 LAB — STREP PNEUMONIAE URINARY ANTIGEN: Strep Pneumo Urinary Antigen: NEGATIVE

## 2012-11-08 MED ORDER — POTASSIUM CHLORIDE 10 MEQ/100ML IV SOLN
INTRAVENOUS | Status: AC
  Administered 2012-11-08: 10 meq via INTRAVENOUS
  Filled 2012-11-08: qty 100

## 2012-11-08 MED ORDER — ETOMIDATE 2 MG/ML IV SOLN
INTRAVENOUS | Status: AC
  Administered 2012-11-08: 20 mg
  Filled 2012-11-08: qty 20

## 2012-11-08 MED ORDER — POTASSIUM CHLORIDE 10 MEQ/100ML IV SOLN
INTRAVENOUS | Status: AC
  Administered 2012-11-08: 10 meq
  Filled 2012-11-08: qty 100

## 2012-11-08 MED ORDER — POTASSIUM CHLORIDE 10 MEQ/100ML IV SOLN
INTRAVENOUS | Status: AC
  Filled 2012-11-08: qty 100

## 2012-11-08 MED ORDER — SUCCINYLCHOLINE CHLORIDE 20 MG/ML IJ SOLN
INTRAMUSCULAR | Status: AC
  Filled 2012-11-08: qty 1

## 2012-11-08 MED ORDER — FENTANYL BOLUS VIA INFUSION
25.0000 ug | Freq: Four times a day (QID) | INTRAVENOUS | Status: DC | PRN
  Filled 2012-11-08: qty 100

## 2012-11-08 MED ORDER — OXEPA PO LIQD
1000.0000 mL | ORAL | Status: DC
  Administered 2012-11-08 – 2012-11-09 (×2): 1000 mL
  Filled 2012-11-08: qty 1000

## 2012-11-08 MED ORDER — SODIUM CHLORIDE 0.9 % IV SOLN
25.0000 ug/h | INTRAVENOUS | Status: DC
  Administered 2012-11-10: 50 ug/h via INTRAVENOUS
  Administered 2012-11-12: 125 ug/h via INTRAVENOUS
  Administered 2012-11-13: 275 ug/h via INTRAVENOUS
  Filled 2012-11-08 (×3): qty 50

## 2012-11-08 MED ORDER — FENTANYL CITRATE 0.05 MG/ML IJ SOLN
INTRAMUSCULAR | Status: AC
  Filled 2012-11-08: qty 2

## 2012-11-08 MED ORDER — MIDAZOLAM BOLUS VIA INFUSION
1.0000 mg | INTRAVENOUS | Status: DC | PRN
  Filled 2012-11-08: qty 2

## 2012-11-08 MED ORDER — MIDAZOLAM HCL 5 MG/ML IJ SOLN
INTRAMUSCULAR | Status: AC
  Filled 2012-11-08: qty 1

## 2012-11-08 MED ORDER — MIDAZOLAM HCL 5 MG/ML IJ SOLN
INTRAMUSCULAR | Status: AC
  Administered 2012-11-08: 2 mg
  Filled 2012-11-08: qty 1

## 2012-11-08 MED ORDER — LIDOCAINE HCL (CARDIAC) 20 MG/ML IV SOLN
INTRAVENOUS | Status: AC
  Filled 2012-11-08: qty 5

## 2012-11-08 MED ORDER — LORAZEPAM 2 MG/ML IJ SOLN
0.5000 mg | INTRAMUSCULAR | Status: DC | PRN
  Administered 2012-11-08: 0.5 mg via INTRAVENOUS
  Administered 2012-11-08: 1 mg via INTRAVENOUS
  Filled 2012-11-08: qty 1

## 2012-11-08 MED ORDER — DOCUSATE SODIUM 50 MG/5ML PO LIQD
100.0000 mg | Freq: Two times a day (BID) | ORAL | Status: DC
  Administered 2012-11-08 – 2012-11-16 (×15): 100 mg via ORAL
  Filled 2012-11-08 (×17): qty 10

## 2012-11-08 MED ORDER — PHENYLEPHRINE HCL 10 MG/ML IJ SOLN
30.0000 ug/min | INTRAVENOUS | Status: DC
  Administered 2012-11-08: 200 ug/min via INTRAVENOUS
  Administered 2012-11-08: 130 ug/min via INTRAVENOUS
  Administered 2012-11-09: 115 ug/min via INTRAVENOUS
  Administered 2012-11-09: 120 ug/min via INTRAVENOUS
  Filled 2012-11-08 (×3): qty 4

## 2012-11-08 MED ORDER — ROCURONIUM BROMIDE 50 MG/5ML IV SOLN
INTRAVENOUS | Status: AC
  Administered 2012-11-08: 50 mg
  Filled 2012-11-08: qty 2

## 2012-11-08 MED ORDER — BIOTENE DRY MOUTH MT LIQD: 15.0000 mL | Freq: Two times a day (BID) | OROMUCOSAL | Status: DC

## 2012-11-08 MED ORDER — CHLORHEXIDINE GLUCONATE 0.12 % MT SOLN
15.0000 mL | Freq: Two times a day (BID) | OROMUCOSAL | Status: DC
  Administered 2012-11-08: 15 mL via OROMUCOSAL
  Filled 2012-11-08: qty 15

## 2012-11-08 MED ORDER — VASOPRESSIN 20 UNIT/ML IJ SOLN
0.0300 [IU]/min | INTRAVENOUS | Status: DC
  Administered 2012-11-08 – 2012-11-10 (×2): 0.03 [IU]/min via INTRAVENOUS
  Filled 2012-11-08 (×2): qty 2.5

## 2012-11-08 MED ORDER — SODIUM CHLORIDE 0.9 % IV BOLUS (SEPSIS)
500.0000 mL | Freq: Once | INTRAVENOUS | Status: AC
  Administered 2012-11-08: 500 mL via INTRAVENOUS

## 2012-11-08 MED ORDER — FENTANYL CITRATE 0.05 MG/ML IJ SOLN
INTRAMUSCULAR | Status: AC
  Administered 2012-11-08: 100 ug
  Filled 2012-11-08: qty 2

## 2012-11-08 MED ORDER — POTASSIUM CHLORIDE 10 MEQ/100ML IV SOLN
10.0000 meq | INTRAVENOUS | Status: AC
  Administered 2012-11-08 (×5): 10 meq via INTRAVENOUS

## 2012-11-08 MED ORDER — BIOTENE DRY MOUTH MT LIQD
15.0000 mL | Freq: Four times a day (QID) | OROMUCOSAL | Status: DC
  Administered 2012-11-08 – 2012-11-16 (×29): 15 mL via OROMUCOSAL

## 2012-11-08 MED ORDER — MAGNESIUM SULFATE 40 MG/ML IJ SOLN
4.0000 g | Freq: Once | INTRAMUSCULAR | Status: AC
  Administered 2012-11-08: 4 g via INTRAVENOUS
  Filled 2012-11-08: qty 100

## 2012-11-08 MED ORDER — SODIUM CHLORIDE 0.9 % IV SOLN
25.0000 ug/h | INTRAVENOUS | Status: DC
  Administered 2012-11-08: 50 ug/h via INTRAVENOUS
  Filled 2012-11-08: qty 50

## 2012-11-08 MED ORDER — SODIUM CHLORIDE 0.9 % IV SOLN
1.0000 mg/h | INTRAVENOUS | Status: DC
  Administered 2012-11-08 – 2012-11-09 (×2): 2 mg/h via INTRAVENOUS
  Administered 2012-11-09 (×2): 3 mg/h via INTRAVENOUS
  Administered 2012-11-12: 4 mg/h via INTRAVENOUS
  Filled 2012-11-08 (×6): qty 10

## 2012-11-08 MED ORDER — METHYLPREDNISOLONE SODIUM SUCC 125 MG IJ SOLR: 80.0000 mg | Freq: Three times a day (TID) | INTRAMUSCULAR | Status: DC

## 2012-11-08 MED ORDER — PANTOPRAZOLE SODIUM 40 MG PO PACK
40.0000 mg | PACK | Freq: Every day | ORAL | Status: DC
  Administered 2012-11-09 – 2012-11-12 (×4): 40 mg
  Filled 2012-11-08 (×5): qty 20

## 2012-11-08 MED ORDER — VANCOMYCIN HCL 10 G IV SOLR
1250.0000 mg | Freq: Two times a day (BID) | INTRAVENOUS | Status: DC
  Administered 2012-11-08 – 2012-11-11 (×8): 1250 mg via INTRAVENOUS
  Filled 2012-11-08 (×9): qty 1250

## 2012-11-08 MED ORDER — CHLORHEXIDINE GLUCONATE 0.12 % MT SOLN
15.0000 mL | Freq: Two times a day (BID) | OROMUCOSAL | Status: DC
  Administered 2012-11-08 – 2012-11-16 (×17): 15 mL via OROMUCOSAL
  Filled 2012-11-08 (×19): qty 15

## 2012-11-08 MED ORDER — PROPOFOL 10 MG/ML IV EMUL
5.0000 ug/kg/min | INTRAVENOUS | Status: DC
  Filled 2012-11-08: qty 100

## 2012-11-08 MED ORDER — PHENYLEPHRINE HCL 10 MG/ML IJ SOLN
30.0000 ug/min | INTRAVENOUS | Status: DC
  Administered 2012-11-08: 30 ug/min via INTRAVENOUS
  Filled 2012-11-08 (×2): qty 1

## 2012-11-08 NOTE — Procedures (Signed)
Intubation Procedure Note Keiaira Donlan [id number] Jul 17, 1941  Procedure: Intubation Indications: Respiratory insufficiency  Procedure Details Consent: Risks of procedure as well as the alternatives and risks of each were explained to the (patient/caregiver).  Consent for procedure obtained. Time Out: Verified patient identification, verified procedure, site/side was marked, verified correct patient position, special equipment/implants available, medications/allergies/relevent history reviewed, required imaging and test results available.  Performed  MAC and 3 Medications:  Fentanyl 100 mcg Etomidate 20 mg Versed 2 mg NMB 50 mg rocuronium    Evaluation Hemodynamic Status: Transient hypotension treated with fluid; O2 sats: transiently fell during during procedure Patient's Current Condition: stable Complications: No apparent complications Patient did tolerate procedure well. Chest X-ray ordered to verify placement.  CXR: pending.   Brett Canales Minor ACNP Adolph Pollack PCCM Pager [phone] till 3 pm If no answer page [phone] 11/08/2012, 10:29 AM   Patient seen and examined, agree with above note.  I dictated the care and orders written for this patient under my direction.  [name], M.D. [phone]

## 2012-11-08 NOTE — Progress Notes (Signed)
12102013/Anasia Agro, RN, BSN, CCM: CHART REVIEWED AND UPDATED.  Next chart review due on 12132013. NO DISCHARGE NEEDS PRESENT AT THIS TIME. CASE MANAGEMENT [phone] 

## 2012-11-08 NOTE — Progress Notes (Signed)
eLink Physician-Brief Progress Note Patient Name: Vanity Larsson DOB: Mar 18, 1941 MRN: [id number]  Date of Service  11/08/2012   HPI/Events of Note   Persistent hypotension  eICU Interventions  Fluid bolus  neo gtt  arterial line   Intervention Category Major Interventions: Hypotension - evaluation and management  ALVA,RAKESH V. 11/08/2012, 5:19 PM

## 2012-11-08 NOTE — Progress Notes (Signed)
Patient ID: [name] [name], female   DOB: 09/06/1941, 71 y.o.   MRN: [id number]    Subjective:  Struggling to breath.  Discussed intubation with son  Objective:  Filed Vitals:   11/08/12 0540 11/08/12 0600 11/08/12 0727 11/08/12 0800  BP:  146/53 146/53 123/65  Pulse: 127 115  123  Temp:      TempSrc:      Resp: 34 31  35  Height:      Weight:      SpO2: 73% 93% 93% 93%    Intake/Output from previous day:  Intake/Output Summary (Last 24 hours) at 11/08/12 0820 Last data filed at 11/08/12 0742  Gross per 24 hour  Intake   1855 ml  Output   1600 ml  Net    255 ml    Physical Exam: Frail female Tachypnic Rhonchi / wheezes diffuse BS positive Plus 2 PT No edema S/P right hip surgery  Lab Results: Basic Metabolic Panel:  Basename 11/08/12 0213 11/07/12 2308  NA 132* 130*  K 2.7* 2.8*  CL 90* 90*  CO2 31 30  GLUCOSE 119* 117*  BUN 14 13  CREATININE 0.78 0.79  CALCIUM 8.7 8.7  MG 1.1* --  PHOS 3.3 --   Liver Function Tests:  Basename 11/06/12 0400  AST 41*  ALT 14  ALKPHOS 61  BILITOT 0.6  PROT 5.7*  ALBUMIN 2.8*   No results found for this basename: LIPASE:2,AMYLASE:2 in the last 72 hours CBC:  Basename 11/08/12 0213 11/07/12 0434 11/06/12 0400  WBC 13.2* 13.5* --  NEUTROABS -- -- 12.4*  HGB 9.3* 8.7* --  HCT 26.4* 25.5* --  MCV 91.7 93.8 --  PLT 265 218 --   Cardiac Enzymes:  Basename 11/06/12 2155 11/06/12 1615 11/06/12 1050  CKTOTAL -- -- --  CKMB -- -- --  CKMBINDEX -- -- --  TROPONINI <0.30 <0.30 <0.30   BNP: No components found with this basename: POCBNP:3 D-Dimer: No results found for this basename: DDIMER:2 in the last 72 hours Hemoglobin A1C: No results found for this basename: HGBA1C in the last 72 hours Fasting Lipid Panel: No results found for this basename: CHOL,HDL,LDLCALC,TRIG,CHOLHDL,LDLDIRECT in the last 72 hours Thyroid Function Tests: No results found for this basename: TSH,T4TOTAL,FREET3,T3FREE,THYROIDAB in the  last 72 hours Anemia Panel: No results found for this basename: VITAMINB12,FOLATE,FERRITIN,TIBC,IRON,RETICCTPCT in the last 72 hours  Imaging: Dg Chest Port 1 View  11/07/2012  *RADIOLOGY REPORT*  Clinical Data: Dyspnea.  Increased oxygen requirement.  PORTABLE CHEST - 1 VIEW  Comparison: 11/06/2012  Findings: Shallow inspiration.  Mild cardiac enlargement.  Diffuse bilateral parenchymal infiltrates consistent with pneumonia or edema.  No pleural effusions.  No pneumothorax.  No significant change since previous study.  IMPRESSION: Stable appearance of diffuse bilateral pulmonary infiltrates.   Original Report Authenticated By: Burman Nieves, M.D.     Cardiac Studies:  ECG:    Telemetry: Sinus tachycardia PAC;s  Echo:  EF 65%  Medications:     . ipratropium  0.5 mg Nebulization Q6H WA   And  . albuterol  2.5 mg Nebulization Q6H WA  . amLODipine  5 mg Oral Daily   And  . benazepril  20 mg Oral Daily  . antiseptic oral rinse  15 mL Mouth Rinse q12n4p  . atorvastatin  20 mg Oral q1800  . aztreonam  1 g Intravenous Q8H  . chlorhexidine  15 mL Mouth Rinse BID  . [COMPLETED] chlorhexidine      . docusate sodium  100  mg Oral BID  . enoxaparin (LOVENOX) injection  30 mg Subcutaneous Q12H  . [COMPLETED] enoxaparin (LOVENOX) injection  30 mg Subcutaneous Once  . ferrous sulfate  325 mg Oral TID PC  . folic acid  1 mg Oral Daily  . furosemide  40 mg Intravenous BID  . [COMPLETED] furosemide  40 mg Intravenous Once  . levofloxacin (LEVAQUIN) IV  750 mg Intravenous Q24H  . multivitamin with minerals  1 tablet Oral Daily  . potassium chloride  10 mEq Intravenous Q1 Hr x 6  . [COMPLETED] potassium chloride      . potassium chloride      . potassium chloride      . potassium chloride      . potassium chloride  40 mEq Oral Daily  . [COMPLETED] potassium chloride  40 mEq Oral Once  . thiamine  100 mg Oral Daily   Or  . thiamine  100 mg Intravenous Daily  . vancomycin  500 mg  Intravenous Q12H  . [DISCONTINUED] furosemide  40 mg Intravenous BID  . [DISCONTINUED] furosemide  40 mg Intravenous TID       Assessment/Plan:  ARDS:  Diastolic dysfunction vs ARDS post hip surgery EF hyperdynamic with bilateral pulmonary infiltrates Good diuresis 2L's No evidence of acute ischemic event.  Will likely need intubation this am to "rest" and  Allow CXR to improve.  Continue bipap until CCM makes decision Son understands issues  Charlton Haws 11/08/2012, 8:20 AM

## 2012-11-08 NOTE — Progress Notes (Signed)
CRITICAL VALUE ALERT  Critical value received:  K 2.7 Date of notification: 11/08/12  Time of notification: 0319 Critical value read back:yes   Nurse who received alert:  Mayme Genta  MD notified (1st page): [email or web address] Time of first page:  0322  MD notified (2nd page):  Time of second page:  Responding [id number] Time MD responded: [phone]

## 2012-11-08 NOTE — Progress Notes (Signed)
eLink Physician-Brief Progress Note Patient Name: [name] [name] DOB: [birth year] MRN: [id number]  Date of Service  11/08/2012   HPI/Events of Note  Soft BP cvp 7 - was being diuresed Mg 1.1  eICU Interventions  Dc benazepril Lower dose of versed Replete Mg   Intervention Category Major Interventions: Hypotension - evaluation and management Intermediate Interventions: Electrolyte abnormality - evaluation and management  Jillyan Plitt V. 11/08/2012, 3:30 PM

## 2012-11-08 NOTE — Procedures (Signed)
Central Venous Catheter Insertion Procedure Note [name] [name] [id number] Dec 31, 1940  Procedure: Insertion of Central Venous Catheter Indications: Assessment of intravascular volume, Drug and/or fluid administration and Frequent blood sampling  Procedure Details Consent: Risks of procedure as well as the alternatives and risks of each were explained to the (patient/caregiver).  Consent for procedure obtained. Time Out: Verified patient identification, verified procedure, site/side was marked, verified correct patient position, special equipment/implants available, medications/allergies/relevent history reviewed, required imaging and test results available.  Performed  Maximum sterile technique was used including antiseptics, cap, gloves, gown, hand hygiene, mask and sheet. Skin prep: Chlorhexidine; local anesthetic administered A antimicrobial bonded/coated triple lumen catheter was placed in the left internal jugular vein using the Seldinger technique. Ultrasound guidance used.yes Catheter placed to 20 cm. Blood aspirated via all 3 ports and then flushed x 3. Line sutured x 2 and dressing applied.  Evaluation Blood flow good Complications: No apparent complications Patient did tolerate procedure well. Chest X-ray ordered to verify placement.  CXR: pending.  Brett Canales Minor ACNP Adolph Pollack PCCM Pager [phone] till 3 pm If no answer page [phone] 11/08/2012, 10:31 AM   U/S used in placement.  Patient seen and examined, agree with above note.  I dictated the care and orders written for this patient under my direction.  [name], M.D. [phone]

## 2012-11-08 NOTE — Consult Note (Deleted)
PULMONARY  / CRITICAL CARE MEDICINE  Name: [name] [name] MRN: [id number] DOB: Apr 04, 1941    LOS: 5  REFERRING MD :  TRH  CHIEF COMPLAINT:  Acute respiratory distress  BRIEF PATIENT DESCRIPTION: 71 yo smoker without history of lung disease s/p elective total hip arthroplasty on 12/5, developed hypoxia on 12/8.  CT angio was negative for PE but showed bilateral asymmetric ground glass opacities. BNP 1378 - diuresed, but developed progressive hypoxia. PCCM consulted on 12/9, BiPAP started. TTE - nml LVEF, no WMA. Intubated 12-10  LINES / TUBES: 12-10 7.5 ott 23 @teeth >> 12-10 L i j cvl 20 cm>>  CULTURES: 12-10 sputum>>  ANTIBIOTICS: Aztreonam 12/8 >>> Vancomycin 12/8 >>> Levaquin 12/8 >>>  SIGNIFICANT EVENTS:  12/5  Elective total hip arthroplasty. 12/8  Developed hypoxia, abx began, diuresed. 12/9  TTE - LVF wnl.  Progressive hypoxia, NRB, BiPAP. 12-10 intubation  VITAL SIGNS: Temp:  [97.9 F (36.6 C)-101 F (38.3 C)] 101 F (38.3 C) (12/10 0000) Pulse Rate:  [99-127] 123  (12/10 0800) Resp:  [22-37] 35  (12/10 0800) BP: (92-146)/(51-92) 123/65 mmHg (12/10 0800) SpO2:  [67 %-100 %] 93 % (12/10 0800) FiO2 (%):  [70 %-100 %] 70 % (12/10 0800) Weight:  [60.7 kg (133 lb 13.1 oz)] 60.7 kg (133 lb 13.1 oz) (12/09 1208)  HEMODYNAMICS:    VENTILATOR SETTINGS: Vent Mode:  [-]  FiO2 (%):  [70 %-100 %] 70 %  INTAKE / OUTPUT: Intake/Output      12/09 0701 - 12/10 0700 12/10 0701 - 12/11 0700   P.O. 840    I.V. (mL/kg) 65 (1.1)    IV Piggyback 850 404   Total Intake(mL/kg) 1755 (28.9) 404 (6.7)   Urine (mL/kg/hr) 1600 (1.1) 50   Total Output 1600 50   Net +155 +354        Urine Occurrence 3 x    Stool Occurrence 3 x      PHYSICAL EXAMINATION: General:  In acute  distress, tachypnea, not tolerating  BiPAP, intubated Neuro:  Awake, alert, oriented, prior to intubation HEENT:  PERRL Cardiovascular:  Irregular rhythm, no murmurs Lungs:  Bilateral diminished air  entry, diffuse rales Abdomen:  Soft, nontender Musculoskeletal:  Moves all extremities, no edema Skin:  Intact  LABS:  Lab 11/08/12 0213 11/07/12 2308 11/07/12 2222 11/07/12 0434 11/06/12 2155 11/06/12 1615 11/06/12 1050 11/06/12 0400 11/05/12 0603 11/05/12 0602 11/04/12 0425  HGB 9.3* -- -- 8.7* -- -- -- 9.7* 9.8* -- 8.8*  WBC 13.2* -- -- 13.5* -- -- -- 14.9* 14.6* -- 8.8  PLT 265 -- -- 218 -- -- -- 212 -- -- --  NA 132* 130* -- 133* -- -- -- 132* -- 132* --  K 2.7* 2.8* -- -- -- -- -- -- -- -- --  CL 90* 90* -- 92* -- -- -- 94* -- 96 --  CO2 31 30 -- 29 -- -- -- 27 -- 27 --  GLUCOSE 119* 117* -- 138* -- -- -- 105* -- 102* --  BUN 14 13 -- 11 -- -- -- 11 -- 15 --  CREATININE 0.78 0.79 -- 0.70 -- -- -- 0.63 -- 0.70 --  CALCIUM 8.7 8.7 -- 8.8 -- -- -- 9.1 -- 9.3 --  MG 1.1* -- -- -- -- -- -- -- -- -- --  PHOS 3.3 -- -- -- -- -- -- -- -- -- --  AST -- -- -- -- -- -- -- 41* -- -- --  ALT -- -- -- -- -- -- --  14 -- -- --  ALKPHOS -- -- -- -- -- -- -- 61 -- -- --  BILITOT -- -- -- -- -- -- -- 0.6 -- -- --  PROT -- -- -- -- -- -- -- 5.7* -- -- --  ALBUMIN -- -- -- -- -- -- -- 2.8* -- -- --  INR -- -- -- -- -- -- -- -- -- -- --  APTT -- -- -- -- -- -- -- -- -- -- --  INR -- -- -- -- -- -- -- -- -- -- --  LATICACIDVEN -- -- -- -- -- -- -- -- -- -- --  TROPONINI -- -- -- -- <0.30 <0.30 <0.30 -- -- -- --  PHART -- -- 7.506* -- -- -- -- -- -- -- --  PCO2ART -- -- 38.4 -- -- -- -- -- -- -- --  PO2ART -- -- 50.7* -- -- -- -- -- -- -- --  HCO3 -- -- 30.3* -- -- -- -- -- -- -- --  O2SAT -- -- 87.5 -- -- -- -- -- -- -- --   IMAGING: Dg Chest Port 1 View  11/07/2012  *RADIOLOGY REPORT*  Clinical Data: Dyspnea.  Increased oxygen requirement.  PORTABLE CHEST - 1 VIEW  Comparison: 11/06/2012  Findings: Shallow inspiration.  Mild cardiac enlargement.  Diffuse bilateral parenchymal infiltrates consistent with pneumonia or edema.  No pleural effusions.  No pneumothorax.  No significant change since  previous study.  IMPRESSION: Stable appearance of diffuse bilateral pulmonary infiltrates.   Original Report Authenticated By: Burman Nieves, M.D.      ECG: 12/8 >>> LVH, NSR  DIAGNOSES: Principal Problem:  *Hypoxemia Active Problems:  Degenerative arthritis of hip  Confusion  Fever  S/P total hip arthroplasty  CHF (congestive heart failure)  HCAP (healthcare-associated pneumonia)  Acute respiratory failure  Normocytic anemia  Hypokalemia  Acute diastolic heart failure   ASSESSMENT / PLAN:  PULMONARY  A: Acute respiratory failure, hypoxemic (DDx:  Pneumonitis, HCAP, Pulmonary edema, NSIP, RB-ILD, HP). P:   12-10 intubated due to refractory hypoxia BD Sputum culture Steroids ESR, CRP, RF, ANCA, Anti DS DNA  CARDIOVASCULAR  A: Acute diastolic CHF. P:  Hold lasix post intubation till HD stable  RENAL  Lab 11/08/12 0213 11/07/12 2308 11/07/12 0434  K 2.7* 2.8* 2.9*     A:  Hypokalemia P:   Replete & recheck  GASTROINTESTINAL  A:  No active issues. P:   NPO /OGT/tf within 24 hours  INFECTIOUS  A:  Suspected HCAP.  PCN allergy. P:   Antibiotics as above PCT Urine Strep / Legionella Ag  I have personally obtained a history, examined the patient, evaluated laboratory and imaging results, formulated the assessment and plan and placed orders.  CRITICAL CARE: The patient is critically ill with multiple organ systems failure and requires high complexity decision making for assessment and support, frequent evaluation and titration of therapies, application of advanced monitoring technologies and extensive interpretation of multiple databases. Critical Care Time devoted to patient care services described in this note is    minutes.

## 2012-11-08 NOTE — Progress Notes (Signed)
ANTIBIOTIC CONSULT NOTE - FOLLOW UP  Pharmacy Consult for Vanc, Levaquin, Aztreonam Indication: HAP  Allergies  Allergen Reactions  . Penicillins Hives and Swelling    Patient Measurements: Height: 5\' 2"  (157.5 cm) Weight: 133 lb 13.1 oz (60.7 kg) IBW/kg (Calculated) : 50.1   Vital Signs: Temp: 101 F (38.3 C) (12/10 0000) Temp src: Oral (12/10 0000) BP: 123/65 mmHg (12/10 0800) Pulse Rate: 123  (12/10 0800)  Labs:  Basename 11/08/12 0213 11/07/12 2308 11/07/12 0434 11/06/12 0400  WBC 13.2* -- 13.5* 14.9*  HGB 9.3* -- 8.7* 9.7*  PLT 265 -- 218 212  LABCREA -- -- -- --  CREATININE 0.78 0.79 0.70 --   Estimated Creatinine Clearance: 56.9 ml/min (by C-G formula based on Cr of 0.78).  Basename 11/08/12 0935  VANCOTROUGH 5.9*  VANCOPEAK --  Drue Dun --  GENTTROUGH --  GENTPEAK --  GENTRANDOM --  TOBRATROUGH --  TOBRAPEAK --  TOBRARND --  AMIKACINPEAK --  AMIKACINTROU --  AMIKACIN --     Assessment:  71 year old female s/p right THA on 11/03/12 and started having SOB and confusion 12/8. CXR with diffuse bilateral pulmonary infiltrates to start broad spectrum abx for HAP. Patient with PCN allergy.  Today is day #3 of Vancomycin, Levaquin and Aztreonam.  Pt febrile overnight to Tmax of 101, WBC slowly improving 13.2K, renal function stable.  PCT 12/10 = 0.66.    Urinary strep pneumo antigen negative, pending urinary legionella antigen. No other cx  Vancomycin trough this morning low at 5.9   Goal of Therapy:  Vancomycin trough level 15-20 mcg/ml  Plan:   Increase Vancomycin to 1250 mg IV q12h  Continue Levaquin and Aztreonam as ordered  Geoffry Paradise, PharmD, BCPS Pager: [phone] 11:02 AM Pharmacy #: [id number]

## 2012-11-08 NOTE — Progress Notes (Signed)
  Comment:  Patient developed progressive respiratory failure, despite BiPAP. Appears to have ARDS, to which tobacco abuse, and possible HCAP may be contributory, as well as element of CHF. Consulted PCCM. Patient is now intubated and transferred to [facility] care. Much appreciated.   C. Yasira Engelson. MD, FACP.

## 2012-11-08 NOTE — Progress Notes (Signed)
Patient ID: [name] [name], female   DOB: 12-22-1940, 71 y.o.   MRN: [id number] Pulmonary status still quite an issue for [name] [name].  She is also appearing fatigued in general from the prolonged hospital stay combined with her breathing issues.  I certainly appreciate the Hospitalist's assistance in her care as well as Cardiology's input.  I talked with her son in length this morning as well.  Will likely call in the assistance of Pulmonology today.

## 2012-11-08 NOTE — Progress Notes (Signed)
CRITICAL VALUE ALERT  Critical value received:  ETT placement  Date of notification:  11/08/12  Time of notification:  1138  Critical value read back:yes  Nurse who received alert:  Sherlene Shams, RN  MD notified (1st page):  Molli Knock; wrote order at 1113am for RT to retract ETT.  Rt in room at 1130 to complete order.  Time of first page:  n/a  MD notified (2nd page):  Time of second page:  Responding MD:  Molli Knock, Minor  Time MD responded:  1145, Minor called to confirm ETT retracted.

## 2012-11-08 NOTE — Progress Notes (Signed)
PT Cancellation Note  Patient Details Name: [name] [name] MRN: [id number] DOB: 10-27-1941   Cancelled Treatment:    Reason Eval/Treat Not Completed: Patient not medically ready.  Pt on Bipap at this time with O2 at 90%.  Per RN, pt to possibly get placed on vent today.  Will continue to check back with pt when appropriate for therapy.    Thanks,    Page, Meribeth Mattes 11/08/2012, 9:18 AM

## 2012-11-08 NOTE — Progress Notes (Signed)
PULMONARY  / CRITICAL CARE MEDICINE  Name: [name] [name] MRN: [id number] DOB: 24-Feb-1941    LOS: 5  REFERRING MD :  TRH  CHIEF COMPLAINT:  Acute respiratory distress  BRIEF PATIENT DESCRIPTION: 71 yo smoker without history of lung disease s/p elective total hip arthroplasty on 12/5, developed hypoxia on 12/8.  CT angio was negative for PE but showed bilateral asymmetric ground glass opacities. BNP 1378 - diuresed, but developed progressive hypoxia. PCCM consulted on 12/9, BiPAP started. TTE - nml LVEF, no WMA. Intubated 12-10  LINES / TUBES: 12-10 7.5 ott 23 @teeth >> 12-10 L i j cvl 20 cm>>  CULTURES: 12-10 sputum>>  ANTIBIOTICS: Aztreonam 12/8 >>> Vancomycin 12/8 >>> Levaquin 12/8 >>>  SIGNIFICANT EVENTS:  12/5  Elective total hip arthroplasty. 12/8  Developed hypoxia, abx began, diuresed. 12/9  TTE - LVF wnl.  Progressive hypoxia, NRB, BiPAP. 12-10 intubation  VITAL SIGNS: Temp:  [97.9 F (36.6 C)-101 F (38.3 C)] 101 F (38.3 C) (12/10 0000) Pulse Rate:  [99-127] 123  (12/10 0800) Resp:  [22-37] 35  (12/10 0800) BP: (92-146)/(51-92) 123/65 mmHg (12/10 0800) SpO2:  [67 %-100 %] 93 % (12/10 0800) FiO2 (%):  [70 %-100 %] 70 % (12/10 0800) Weight:  [60.7 kg (133 lb 13.1 oz)] 60.7 kg (133 lb 13.1 oz) (12/09 1208)  HEMODYNAMICS:  VENTILATOR SETTINGS: Vent Mode:  [-]  FiO2 (%):  [70 %-100 %] 70 %  INTAKE / OUTPUT: Intake/Output      12/09 0701 - 12/10 0700 12/10 0701 - 12/11 0700   P.O. 840    I.V. (mL/kg) 65 (1.1)    IV Piggyback 850 404   Total Intake(mL/kg) 1755 (28.9) 404 (6.7)   Urine (mL/kg/hr) 1600 (1.1) 50   Total Output 1600 50   Net +155 +354        Urine Occurrence 3 x    Stool Occurrence 3 x      PHYSICAL EXAMINATION: General:  In acute  distress, tachypnea, not tolerating  BiPAP, intubated Neuro:  Awake, alert, oriented, prior to intubation HEENT:  PERRL Cardiovascular:  Irregular rhythm, no murmurs Lungs:  Bilateral diminished air entry,  diffuse rales Abdomen:  Soft, nontender Musculoskeletal:  Moves all extremities, no edema Skin:  Intact  LABS:  Lab 11/08/12 0213 11/07/12 2308 11/07/12 2222 11/07/12 0434 11/06/12 2155 11/06/12 1615 11/06/12 1050 11/06/12 0400 11/05/12 0603 11/05/12 0602 11/04/12 0425  HGB 9.3* -- -- 8.7* -- -- -- 9.7* 9.8* -- 8.8*  WBC 13.2* -- -- 13.5* -- -- -- 14.9* 14.6* -- 8.8  PLT 265 -- -- 218 -- -- -- 212 -- -- --  NA 132* 130* -- 133* -- -- -- 132* -- 132* --  K 2.7* 2.8* -- -- -- -- -- -- -- -- --  CL 90* 90* -- 92* -- -- -- 94* -- 96 --  CO2 31 30 -- 29 -- -- -- 27 -- 27 --  GLUCOSE 119* 117* -- 138* -- -- -- 105* -- 102* --  BUN 14 13 -- 11 -- -- -- 11 -- 15 --  CREATININE 0.78 0.79 -- 0.70 -- -- -- 0.63 -- 0.70 --  CALCIUM 8.7 8.7 -- 8.8 -- -- -- 9.1 -- 9.3 --  MG 1.1* -- -- -- -- -- -- -- -- -- --  PHOS 3.3 -- -- -- -- -- -- -- -- -- --  AST -- -- -- -- -- -- -- 41* -- -- --  ALT -- -- -- -- -- -- --  14 -- -- --  ALKPHOS -- -- -- -- -- -- -- 61 -- -- --  BILITOT -- -- -- -- -- -- -- 0.6 -- -- --  PROT -- -- -- -- -- -- -- 5.7* -- -- --  ALBUMIN -- -- -- -- -- -- -- 2.8* -- -- --  INR -- -- -- -- -- -- -- -- -- -- --  APTT -- -- -- -- -- -- -- -- -- -- --  INR -- -- -- -- -- -- -- -- -- -- --  LATICACIDVEN -- -- -- -- -- -- -- -- -- -- --  TROPONINI -- -- -- -- <0.30 <0.30 <0.30 -- -- -- --  PHART -- -- 7.506* -- -- -- -- -- -- -- --  PCO2ART -- -- 38.4 -- -- -- -- -- -- -- --  PO2ART -- -- 50.7* -- -- -- -- -- -- -- --  HCO3 -- -- 30.3* -- -- -- -- -- -- -- --  O2SAT -- -- 87.5 -- -- -- -- -- -- -- --   IMAGING: Dg Chest Port 1 View  11/07/2012  *RADIOLOGY REPORT*  Clinical Data: Dyspnea.  Increased oxygen requirement.  PORTABLE CHEST - 1 VIEW  Comparison: 11/06/2012  Findings: Shallow inspiration.  Mild cardiac enlargement.  Diffuse bilateral parenchymal infiltrates consistent with pneumonia or edema.  No pleural effusions.  No pneumothorax.  No significant change since  previous study.  IMPRESSION: Stable appearance of diffuse bilateral pulmonary infiltrates.   Original Report Authenticated By: Burman Nieves, M.D.    ECG: 12/8 >>> LVH, NSR  DIAGNOSES: Principal Problem:  *Hypoxemia Active Problems:  Degenerative arthritis of hip  Confusion  Fever  S/P total hip arthroplasty  CHF (congestive heart failure)  HCAP (healthcare-associated pneumonia)  Acute respiratory failure  Normocytic anemia  Hypokalemia  Acute diastolic heart failure   ASSESSMENT / PLAN:  PULMONARY  A: Acute respiratory failure, hypoxemic (DDx:  Pneumonitis, HCAP, Pulmonary edema, NSIP, RB-ILD, HP). P:   - 12-10 intubated due to refractory hypoxia. - BD. - Sputum culture. - Steroids. - ESR, CRP, RF, ANCA, Anti DS DNA.  CARDIOVASCULAR  A: Acute diastolic CHF. P:  - Hold lasix post intubation due to hypotension. - Monitor EKG.  RENAL  Lab 11/08/12 0213 11/07/12 2308 11/07/12 0434  K 2.7* 2.8* 2.9*   A:  Hypokalemia P:   - Replete & recheck.  GASTROINTESTINAL  A:  No active issues. P:   - Place OGT. - Start TF.  INFECTIOUS  A:  Suspected HCAP.  PCN allergy. P:   - Antibiotics as above - PCT - Urine Strep / Legionella Ag  I have personally obtained a history, examined the patient, evaluated laboratory and imaging results, formulated the assessment and plan and placed orders.  CRITICAL CARE: The patient is critically ill with multiple organ systems failure and requires high complexity decision making for assessment and support, frequent evaluation and titration of therapies, application of advanced monitoring technologies and extensive interpretation of multiple databases. Critical Care Time devoted to patient care services described in this note is 40 minutes.   Alyson Reedy, M.D. [facility] Pulmonary/Critical Care Medicine. Pager: [phone]. After hours pager: [phone].

## 2012-11-08 NOTE — Progress Notes (Signed)
INITIAL ADULT NUTRITION ASSESSMENT Date: 11/08/2012   Time: 1:18 PM Reason for Assessment: consult; initiation and TF management  ASSESSMENT: Female 71 y.o.  Dx: Hypoxemia  Hx:  Past Medical History  Diagnosis Date  . Hypertension   . Arthritis     a. 10/2012 s/p Right THA.  Marland Kitchen Hyperlipidemia   . Pulmonary nodule     a. 10/2012 CT: 13mm anterior LUL nodule w/ spiculated appearance - rec PET CT.   Past Surgical History  Procedure Date  . Tonsillectomy   . Total hip arthroplasty 11/03/2012    Procedure: TOTAL HIP ARTHROPLASTY ANTERIOR APPROACH;  Surgeon: Kathryne Hitch, MD;  Location: [facility] ORS;  Service: Orthopedics;  Laterality: Right;  Right Total Hip Arthroplasty, Anterior Approach (C-Arm)  . Tubal ligation     Related Meds:  Scheduled Meds:   . ipratropium  0.5 mg Nebulization Q6H WA   And  . albuterol  2.5 mg Nebulization Q6H WA  . antiseptic oral rinse  15 mL Mouth Rinse QID  . aztreonam  1 g Intravenous Q8H  . benazepril  20 mg Oral Daily  . chlorhexidine  15 mL Mouth Rinse BID  . [COMPLETED] chlorhexidine      . docusate sodium  100 mg Oral BID  . enoxaparin (LOVENOX) injection  30 mg Subcutaneous Q12H  . [COMPLETED] enoxaparin (LOVENOX) injection  30 mg Subcutaneous Once  . [COMPLETED] etomidate      . [COMPLETED] fentaNYL      . ferrous sulfate  325 mg Oral TID PC  . folic acid  1 mg Oral Daily  . [COMPLETED] furosemide  40 mg Intravenous Once  . levofloxacin (LEVAQUIN) IV  750 mg Intravenous Q24H  . lidocaine (cardiac) 100 mg/58ml      . [COMPLETED] midazolam      . multivitamin with minerals  1 tablet Oral Daily  . [EXPIRED] potassium chloride  10 mEq Intravenous Q1 Hr x 6  . [COMPLETED] potassium chloride      . potassium chloride      . potassium chloride      . potassium chloride  40 mEq Oral Daily  . [COMPLETED] potassium chloride  40 mEq Oral Once  . [COMPLETED] rocuronium      . succinylcholine      . thiamine  100 mg Oral Daily   Or  .  thiamine  100 mg Intravenous Daily  . vancomycin  1,250 mg Intravenous Q12H  . [DISCONTINUED] amLODipine  5 mg Oral Daily  . [DISCONTINUED] antiseptic oral rinse  15 mL Mouth Rinse q12n4p  . [DISCONTINUED] atorvastatin  20 mg Oral q1800  . [DISCONTINUED] chlorhexidine  15 mL Mouth Rinse BID  . [DISCONTINUED] furosemide  40 mg Intravenous BID  . [DISCONTINUED] methylPREDNISolone (SOLU-MEDROL) injection  80 mg Intravenous Q8H  . [DISCONTINUED] vancomycin  500 mg Intravenous Q12H   Continuous Infusions:   . fentaNYL infusion INTRAVENOUS 75 mcg/hr (11/08/12 1202)  . midazolam (VERSED) infusion 4 mg/hr (11/08/12 1240)  . [DISCONTINUED] fentaNYL infusion INTRAVENOUS 50 mcg/hr (11/08/12 1145)  . [DISCONTINUED] propofol     PRN Meds:.acetaminophen, acetaminophen, albuterol, alum & mag hydroxide-simeth, bisacodyl, fentaNYL, HYDROcodone-acetaminophen, menthol-cetylpyridinium, methocarbamol (ROBAXIN) IV, methocarbamol, metoCLOPramide (REGLAN) injection, metoCLOPramide, midazolam, ondansetron (ZOFRAN) IV, ondansetron, phenol, polyvinyl alcohol, zolpidem, [DISCONTINUED] diphenhydrAMINE, [DISCONTINUED] fentaNYL, [DISCONTINUED] LORazepam, [DISCONTINUED] LORazepam [DISCONTINUED] LORazepam   Ht: 5\' 2"  (157.5 cm)  Wt: 133 lb 13.1 oz (60.7 kg)  Ideal Wt: 50kg % Ideal Wt: 120%  Usual Wt: unknown Wt Readings from Last  10 Encounters:  11/07/12 133 lb 13.1 oz (60.7 kg)  11/07/12 133 lb 13.1 oz (60.7 kg)  10/31/12 130 lb (58.968 kg)    Body mass index is 24.48 kg/(m^2).  Food/Nutrition Related Hx: unable to assess, pt admitted with hip pain r/t to end stage arthritis   Labs:  CMP     Component Value Date/Time   NA 132* 11/08/2012 0213   K 2.7* 11/08/2012 0213   CL 90* 11/08/2012 0213   CO2 31 11/08/2012 0213   GLUCOSE 119* 11/08/2012 0213   BUN 14 11/08/2012 0213   CREATININE 0.78 11/08/2012 0213   CALCIUM 8.7 11/08/2012 0213   PROT 5.7* 11/06/2012 0400   ALBUMIN 2.8* 11/06/2012 0400   AST  41* 11/06/2012 0400   ALT 14 11/06/2012 0400   ALKPHOS 61 11/06/2012 0400   BILITOT 0.6 11/06/2012 0400   GFRNONAA 83* 11/08/2012 0213   GFRAA >90 11/08/2012 0213    Intake: 0-100% of Regular meals prior to respiratory distress Output:   Intake/Output Summary (Last 24 hours) at 11/08/12 1325 Last data filed at 11/08/12 1257  Gross per 24 hour  Intake   2169 ml  Output   2375 ml  Net   -206 ml   Last BM (12/9)  Diet Order:  NPO  Supplements/Tube Feeding:  MVI, thiamine, folic acid  IVF:    fentaNYL infusion INTRAVENOUS Last Rate: 75 mcg/hr (11/08/12 1202)  midazolam (VERSED) infusion Last Rate: 4 mg/hr (11/08/12 1240)  [DISCONTINUED] fentaNYL infusion INTRAVENOUS Last Rate: 50 mcg/hr (11/08/12 1145)  [DISCONTINUED] propofol     Estimated Nutritional Needs:   Kcal: 1512 Protein: 72-84g Fluid: >1.8 L/day  Pt admitted with end stage arthritis to hip for total hip arthroplasty. Pt with some shortness of breath and confusion post-op. Transferred to ICU and intubated due to acute respiratory failure and possible ARDS.   Patient is currently intubated on ventilator support.  MV: 10.3 L/min Temp:Temp (24hrs), Avg:100.6 F (38.1 C), Min:98.1 F (36.7 C), Max:101.1 F (38.4 C)   NUTRITION DIAGNOSIS: -Inadequate oral intake (NI-2.1).  Status: Ongoing  RELATED TO: mechanical ventilation  AS EVIDENCE BY: pt intubated, NPO  MONITORING/EVALUATION(Goals): 1.  Enteral nutrition; initiation with tolerance.  EDUCATION NEEDS: -Education not appropriate at this time  INTERVENTION: 1.  Enteral nutrition; initiate Oxepa @ 20 mL/hr continuous via OGT.  Advance by 10 mL q 4 hrs to 42 mL/hr goal to provide 1512 kcal, 63g protein, 791 mL free water.    DOCUMENTATION CODES Per approved criteria  -Not Applicable    Loyce Dys, MS RD LDN Clinical Inpatient Dietitian Pager: [phone] Weekend/After hours pager: [phone]

## 2012-11-08 NOTE — Progress Notes (Signed)
eLink Physician-Brief Progress Note Patient Name: [name] [name] DOB: 1941-10-24 MRN: [id number]  Date of Service  11/08/2012   HPI/Events of Note   Lab 11/08/12 0213 11/07/12 2308 11/07/12 0434 11/06/12 0400 11/05/12 0602  NA 132* 130* 133* 132* 132*  K 2.7* 2.8* -- -- --  CL 90* 90* 92* 94* 96  CO2 31 30 29 27 27   GLUCOSE 119* 117* 138* 105* 102*  BUN 14 13 11 11 15   CREATININE 0.78 0.79 0.70 0.63 0.70  CALCIUM 8.7 8.7 8.8 9.1 9.3  MG -- -- -- -- --  PHOS -- -- -- -- --   Estimated Creatinine Clearance: 56.9 ml/min (by C-G formula based on Cr of 0.78).    eICU Interventions  HYPOKALEMIA  - iv KCL X 6 RUNS VIA piv (RN says patient does not want po kcl due to taste) - add mag and phos check to lab    Intervention Category Major Interventions: Electrolyte abnormality - evaluation and management  [name] 11/08/2012, 3:24 AM

## 2012-11-09 ENCOUNTER — Inpatient Hospital Stay (HOSPITAL_COMMUNITY): Payer: Medicare Other

## 2012-11-09 DIAGNOSIS — E876 Hypokalemia: Secondary | ICD-10-CM

## 2012-11-09 LAB — BLOOD GAS, ARTERIAL
Acid-Base Excess: 0.6 mmol/L (ref 0.0–2.0)
Bicarbonate: 24.1 mEq/L — ABNORMAL HIGH (ref 20.0–24.0)
Bicarbonate: 24.4 mEq/L — ABNORMAL HIGH (ref 20.0–24.0)
FIO2: 0.7 %
FIO2: 0.5 %
MECHVT: 500 mL
O2 Saturation: 96.8 %
O2 Saturation: 98.3 %
Patient temperature: 98.6
Patient temperature: 98.6
Patient temperature: 98.6
Pressure control: 25 cmH2O
Pressure control: 25 cmH2O
Pressure control: 25 cmH2O
Pressure support: 5 cmH2O
RATE: 30 resp/min
TCO2: 23.4 mmol/L (ref 0–100)
TCO2: 24.5 mmol/L (ref 0–100)
pCO2 arterial: 45.1 mmHg — ABNORMAL HIGH (ref 35.0–45.0)
pH, Arterial: 7.336 — ABNORMAL LOW (ref 7.350–7.450)
pH, Arterial: 7.347 — ABNORMAL LOW (ref 7.350–7.450)
pO2, Arterial: 76.6 mmHg — ABNORMAL LOW (ref 80.0–100.0)

## 2012-11-09 LAB — BASIC METABOLIC PANEL
BUN: 26 mg/dL — ABNORMAL HIGH (ref 6–23)
CO2: 25 mEq/L (ref 19–32)
Calcium: 8.2 mg/dL — ABNORMAL LOW (ref 8.4–10.5)
Chloride: 92 mEq/L — ABNORMAL LOW (ref 96–112)
Chloride: 94 mEq/L — ABNORMAL LOW (ref 96–112)
Creatinine, Ser: 0.77 mg/dL (ref 0.50–1.10)
GFR calc Af Amer: 77 mL/min — ABNORMAL LOW (ref >90)
Glucose, Bld: 160 mg/dL — ABNORMAL HIGH (ref 70–99)
Potassium: 3.2 mEq/L — ABNORMAL LOW (ref 3.5–5.1)
Sodium: 128 mEq/L — ABNORMAL LOW (ref 135–145)

## 2012-11-09 LAB — URINE MICROSCOPIC-ADD ON

## 2012-11-09 LAB — URINALYSIS, ROUTINE W REFLEX MICROSCOPIC
Bilirubin Urine: NEGATIVE
Glucose, UA: NEGATIVE mg/dL
Ketones, ur: NEGATIVE mg/dL
Nitrite: NEGATIVE
Specific Gravity, Urine: 1.02 (ref 1.005–1.030)
pH: 6 (ref 5.0–8.0)

## 2012-11-09 LAB — CBC
MCV: 91.6 fL (ref 78.0–100.0)
Platelets: 332 10*3/uL (ref 150–400)
RBC: 2.63 MIL/uL — ABNORMAL LOW (ref 3.87–5.11)
WBC: 16.1 10*3/uL — ABNORMAL HIGH (ref 4.0–10.5)

## 2012-11-09 LAB — PROCALCITONIN: Procalcitonin: 1.59 ng/mL

## 2012-11-09 LAB — LACTIC ACID, PLASMA: Lactic Acid, Venous: 1.8 mmol/L (ref 0.5–2.2)

## 2012-11-09 MED ORDER — METOCLOPRAMIDE HCL 5 MG/ML IJ SOLN
10.0000 mg | Freq: Three times a day (TID) | INTRAMUSCULAR | Status: DC
  Administered 2012-11-09 – 2012-11-12 (×8): 10 mg via INTRAVENOUS
  Filled 2012-11-09 (×10): qty 2

## 2012-11-09 MED ORDER — POTASSIUM CHLORIDE 10 MEQ/100ML IV SOLN
10.0000 meq | INTRAVENOUS | Status: AC
  Administered 2012-11-09 (×4): 10 meq via INTRAVENOUS
  Filled 2012-11-09 (×4): qty 100

## 2012-11-09 MED ORDER — IPRATROPIUM-ALBUTEROL 18-103 MCG/ACT IN AERO: 6.0000 | INHALATION_SPRAY | RESPIRATORY_TRACT | Status: DC | PRN

## 2012-11-09 MED ORDER — ADULT MULTIVITAMIN LIQUID CH
5.0000 mL | Freq: Every day | ORAL | Status: DC
  Administered 2012-11-09 – 2012-11-13 (×5): 5 mL
  Filled 2012-11-09 (×5): qty 5

## 2012-11-09 MED ORDER — IPRATROPIUM-ALBUTEROL 18-103 MCG/ACT IN AERO
6.0000 | INHALATION_SPRAY | Freq: Four times a day (QID) | RESPIRATORY_TRACT | Status: DC
  Administered 2012-11-09 – 2012-11-13 (×17): 6 via RESPIRATORY_TRACT
  Filled 2012-11-09 (×2): qty 14.7

## 2012-11-09 MED ORDER — POTASSIUM CHLORIDE 20 MEQ/15ML (10%) PO LIQD
40.0000 meq | Freq: Every day | ORAL | Status: DC
  Administered 2012-11-09 – 2012-11-11 (×3): 40 meq
  Filled 2012-11-09 (×3): qty 30

## 2012-11-09 MED ORDER — INSULIN ASPART 100 UNIT/ML ~~LOC~~ SOLN
2.0000 [IU] | SUBCUTANEOUS | Status: DC
Start: 2012-11-10 — End: 2012-11-16
  Administered 2012-11-09: 4 [IU] via SUBCUTANEOUS
  Administered 2012-11-10 – 2012-11-14 (×21): 2 [IU] via SUBCUTANEOUS
  Administered 2012-11-15: 4 [IU] via SUBCUTANEOUS
  Administered 2012-11-16: 2 [IU] via SUBCUTANEOUS

## 2012-11-09 MED ORDER — METHYLPREDNISOLONE SODIUM SUCC 125 MG IJ SOLR
80.0000 mg | Freq: Three times a day (TID) | INTRAMUSCULAR | Status: DC
  Administered 2012-11-09 – 2012-11-14 (×15): 80 mg via INTRAVENOUS
  Filled 2012-11-09: qty 2
  Filled 2012-11-09 (×12): qty 1.28
  Filled 2012-11-09: qty 2
  Filled 2012-11-09: qty 1.28
  Filled 2012-11-09: qty 2
  Filled 2012-11-09 (×2): qty 1.28

## 2012-11-09 MED ORDER — FERROUS SULFATE 300 (60 FE) MG/5ML PO SYRP
300.0000 mg | ORAL_SOLUTION | Freq: Three times a day (TID) | ORAL | Status: DC
  Administered 2012-11-09 – 2012-11-13 (×13): 300 mg
  Filled 2012-11-09 (×15): qty 5

## 2012-11-09 MED ORDER — NOREPINEPHRINE BITARTRATE 1 MG/ML IJ SOLN
2.0000 ug/min | INTRAVENOUS | Status: DC
  Administered 2012-11-09: 8 ug/min via INTRAVENOUS
  Administered 2012-11-09: 5 ug/min via INTRAVENOUS
  Filled 2012-11-09 (×2): qty 4

## 2012-11-09 NOTE — Progress Notes (Signed)
Elink: 5:24 AM @ 11/09/2012     Lab 11/09/12 0330 11/08/12 1520 11/08/12 0213 11/07/12 2308 11/07/12 0434  NA 128* 129* 132* 130* 133*  K 3.2* 3.7 -- -- --  CL 92* 91* 90* 90* 92*  CO2 26 30 31 30 29   GLUCOSE 127* 97 119* 117* 138*  BUN 26* 18 14 13 11   CREATININE 0.87 0.91 0.78 0.79 0.70  CALCIUM 8.1* 8.2* 8.7 8.7 8.8  MG 2.5 -- 1.1* -- --  PHOS 3.1 -- 3.3 -- --    A Hypokalemia  P Replete kcl x 4 runs IV   Dr. Kalman Shan, M.D., [facility].C.P Pulmonary and Critical Care Medicine Staff Physician [facility] [place] Pulmonary and Critical Care Pager: [phone], If no answer or between  15:00h - 7:00h: call [phone]  11/09/2012 5:24 AM

## 2012-11-09 NOTE — Progress Notes (Signed)
eLink Physician-Brief Progress Note Patient Name: [name] [name] DOB: Feb 10, 1941 MRN: [id number]  Date of Service  11/09/2012   HPI/Events of Note     eICU Interventions  TFs held -high residuals ICU HG protocol    Intervention Category Intermediate Interventions: Hyperglycemia - evaluation and treatment  ALVA,RAKESH V. 11/09/2012, 10:49 PM

## 2012-11-09 NOTE — Progress Notes (Signed)
Patient ID: [name] [name], female   DOB: [birth year], 71 y.o.   MRN: [id number]    Subjective:  Intubated somewhat agitated  Objective:  Filed Vitals:   11/09/12 0700 11/09/12 0715 11/09/12 0730 11/09/12 0745  BP:      Pulse: 72 67 75 71  Temp: 101.8 F (38.8 C) 101.8 F (38.8 C) 101.8 F (38.8 C) 102 F (38.9 C)  TempSrc:      Resp: 30 29 29 26   Height:      Weight:      SpO2: 88% 90% 88% 93%    Intake/Output from previous day:  Intake/Output Summary (Last 24 hours) at 11/09/12 0817 Last data filed at 11/09/12 0650  Gross per 24 hour  Intake 4166.32 ml  Output   1545 ml  Net 2621.32 ml    Physical Exam: Frail female Intubated with RIJ and NG tube Rhonchi / wheezes diffuse BS positive Plus 2 PT No edema S/P right hip surgery  Lab Results: Basic Metabolic Panel:  Basename 11/09/12 0330 11/08/12 1520 11/08/12 0213  NA 128* 129* --  K 3.2* 3.7 --  CL 92* 91* --  CO2 26 30 --  GLUCOSE 127* 97 --  BUN 26* 18 --  CREATININE 0.87 0.91 --  CALCIUM 8.1* 8.2* --  MG 2.5 -- 1.1*  PHOS 3.1 -- 3.3   CBC:  Basename 11/09/12 0330 11/08/12 0213  WBC 16.1* 13.2*  NEUTROABS -- --  HGB 8.5* 9.3*  HCT 24.1* 26.4*  MCV 91.6 91.7  PLT 332 265   Cardiac Enzymes:  Basename 11/06/12 2155 11/06/12 1615 11/06/12 1050  CKTOTAL -- -- --  CKMB -- -- --  CKMBINDEX -- -- --  TROPONINI <0.30 <0.30 <0.30    Imaging: Dg Chest Port 1 View  11/08/2012  *RADIOLOGY REPORT*  Clinical Data: Endotracheal tube placement.  PORTABLE CHEST - 1 VIEW  Comparison: 11/07/2012.  Findings: Endotracheal tube tip at the level of the carina directed towards the right mainstem bronchus.  Recommend retracting by 3 cm.  Left central line tip mid to distal superior vena cava level.  Nasogastric tube courses below the diaphragm.  The tip is not included on this exam.  Diffuse air space disease.  This may represent diffuse pulmonary edema.  Infectious infiltrate not entirely excluded.  No gross  pneumothorax.  Cardiomegaly.  Calcified aorta.  IMPRESSION: Endotracheal tube tip at the level of the carina directed towards the right mainstem bronchus.  Recommend retracting by 3 cm.  Left central line tip mid to distal superior vena cava level.  Nasogastric tube courses below the diaphragm.  The tip is not included on this exam.  Diffuse air space disease.  This may represent diffuse pulmonary edema.  Infectious infiltrate not entirely excluded.  No gross pneumothorax.  Cardiomegaly.  Critical Value/emergent results were called by telephone at the time of interpretation on 11/08/2012 at 11:38 a.m. to [facility] patients nurse, who verbally acknowledged these results.   Original Report Authenticated By: Lacy Duverney, M.D.     Cardiac Studies:  ECG:  SR /ST no ischemia   Telemetry: Sinus with no PAF or VT/ 11/09/2012  Much less tachycardic then yesterday  Echo:  EF 65%  Medications:      . albuterol-ipratropium  6 puff Inhalation Q6H  . antiseptic oral rinse  15 mL Mouth Rinse QID  . aztreonam  1 g Intravenous Q8H  . chlorhexidine  15 mL Mouth Rinse BID  . docusate  100 mg Oral BID  .  enoxaparin (LOVENOX) injection  30 mg Subcutaneous Q12H  . [COMPLETED] etomidate      . [COMPLETED] fentaNYL      . ferrous sulfate  325 mg Oral TID PC  . folic acid  1 mg Oral Daily  . levofloxacin (LEVAQUIN) IV  750 mg Intravenous Q24H  . [EXPIRED] lidocaine (cardiac) 100 mg/101ml      . [COMPLETED] magnesium sulfate 1 - 4 g bolus IVPB  4 g Intravenous Once  . [COMPLETED] midazolam      . multivitamin with minerals  1 tablet Oral Daily  . pantoprazole sodium  40 mg Per Tube Q1200  . [EXPIRED] potassium chloride  10 mEq Intravenous Q1 Hr x 6  . potassium chloride  10 mEq Intravenous Q1 Hr x 4  . [EXPIRED] potassium chloride      . potassium chloride  40 mEq Oral Daily  . [COMPLETED] rocuronium      . [COMPLETED] sodium chloride  500 mL Intravenous Once  . [EXPIRED] succinylcholine      . thiamine  100 mg  Oral Daily   Or  . thiamine  100 mg Intravenous Daily  . vancomycin  1,250 mg Intravenous Q12H  . [DISCONTINUED] albuterol  2.5 mg Nebulization Q6H WA  . [DISCONTINUED] amLODipine  5 mg Oral Daily  . [DISCONTINUED] antiseptic oral rinse  15 mL Mouth Rinse q12n4p  . [DISCONTINUED] atorvastatin  20 mg Oral q1800  . [DISCONTINUED] benazepril  20 mg Oral Daily  . [DISCONTINUED] chlorhexidine  15 mL Mouth Rinse BID  . [DISCONTINUED] docusate sodium  100 mg Oral BID  . [DISCONTINUED] furosemide  40 mg Intravenous BID  . [DISCONTINUED] ipratropium  0.5 mg Nebulization Q6H WA  . [DISCONTINUED] methylPREDNISolone (SOLU-MEDROL) injection  80 mg Intravenous Q8H  . [DISCONTINUED] vancomycin  500 mg Intravenous Q12H        . feeding supplement (OXEPA) 1,000 mL (11/09/12 0318)  . fentaNYL infusion INTRAVENOUS 25 mcg/hr (11/08/12 1945)  . midazolam (VERSED) infusion 2 mg/hr (11/09/12 0751)  . phenylephrine (NEO-SYNEPHRINE) Adult infusion 115 mcg/min (11/09/12 0751)  . vasopressin (PITRESSIN) infusion - *FOR SHOCK* 0.03 Units/min (11/09/12 0600)  . [DISCONTINUED] fentaNYL infusion INTRAVENOUS 50 mcg/hr (11/08/12 1145)  . [DISCONTINUED] phenylephrine (NEO-SYNEPHRINE) Adult infusion 200 mcg/min (11/08/12 1842)  . [DISCONTINUED] propofol      Assessment/Plan:  ARDS:  Likey ARDS ? Aspiration.  CVP yesterday only 7.  Got fluid and is on two pressors now with MAP Over 60.  Does not appear to be a cardiac component to her decline.  Consider replacing volume with blood given  Anemia.  Vent support, pressors and antibiotics per CCM.  Discussed situration with son last night  Charlton Haws 11/09/2012, 8:17 AM

## 2012-11-09 NOTE — Progress Notes (Signed)
Patient ID: [name] [name], female   DOB: May 30, 1941, 71 y.o.   MRN: [id number] Currently intubated and sedated.  On 2 pressors to keep SBP up.  Creatinine stable thus far.  Appreciate Critical Care assisting in her care.

## 2012-11-09 NOTE — Progress Notes (Signed)
eLink Physician-Brief Progress Note Patient Name: [name] [name] DOB: [birth year] MRN: [id number]  Date of Service  11/09/2012   HPI/Events of Note   High residuals ABg reviewed on APRV  eICU Interventions  reglan iv   Intervention Category Major Interventions: Acid-Base disturbance - evaluation and management Intermediate Interventions: Other:  Arisbel Maione V. 11/09/2012, 6:04 PM

## 2012-11-09 NOTE — Progress Notes (Signed)
PT Cancellation Note  Patient Details Name: [name] [name] MRN: [id number] DOB: Feb 28, 1941   Cancelled Treatment:    Reason Eval/Treat Not Completed: Patient not medically ready.  Pt now on vent/intubated on 70% FiO2 with fever and low BP.  Not appropriate for therapy at this time.  Will continue to follow.    Thanks,    Page, Meribeth Mattes 11/09/2012, 6:51 AM

## 2012-11-09 NOTE — Progress Notes (Signed)
PULMONARY  / CRITICAL CARE MEDICINE  Name: [name] [name] MRN: [id number] DOB: 1941/04/20    LOS: 5  REFERRING MD :  TRH  CHIEF COMPLAINT:  Acute respiratory distress  BRIEF PATIENT DESCRIPTION: 71 yo smoker without history of lung disease s/p elective total hip arthroplasty on 12/5, developed hypoxia on 12/8.  CT angio was negative for PE but showed bilateral asymmetric ground glass opacities. BNP 1378 - diuresed, but developed progressive hypoxia. PCCM consulted on 12/9, BiPAP started. TTE - nml LVEF, no WMA. Intubated 12-10  LINES / TUBES: 12-10 7.5 ott 23 @teeth >> 12-10 L i j cvl 20 cm>>  CULTURES: 12-10 sputum>>  ANTIBIOTICS: Aztreonam 12/8 >>> Vancomycin 12/8 >>> Levaquin 12/8 >>>  SIGNIFICANT EVENTS:  12/5  Elective total hip arthroplasty. 12/8  Developed hypoxia, abx began, diuresed. 12/9  TTE - LVF wnl.  Progressive hypoxia, NRB, BiPAP. 12-10 intubation 12-11 Changed to APRV ventilation  VITAL SIGNS: BP 112/52  Pulse 73  Temp 102 F (38.9 C) (Core (Comment))  Resp 28  Ht 5\' 2"  (1.575 m)  Wt 63.2 kg (139 lb 5.3 oz)  BMI 25.48 kg/m2  SpO2 91%  HEMODYNAMICS:  VENTILATOR SETTINGS: Vent Mode:  [-] Bi-Vent FiO2 (%):  [60 %-100 %] 70 % Set Rate:  [16 bmp] 16 bmp Vt Set:  [500 mL] 500 mL PEEP:  [0 cmH20-5 cmH20] 0 cmH20 Pressure Support:  [5 cmH20] 5 cmH20 Plateau Pressure:  [16 cmH20-31 cmH20] 28 cmH20 INTAKE / OUTPUT:  Intake/Output Summary (Last 24 hours) at 11/09/12 0939 Last data filed at 11/09/12 0815  Gross per 24 hour  Intake 4153.02 ml  Output   1705 ml  Net 2448.02 ml         Urine Occurrence 3 x    Stool Occurrence 3 x      PHYSICAL EXAMINATION: General:  Sedated on vent Neuro:  Sedated on vent, mae x 4 HEENT:  PERRL Cardiovascular:  Irregular rhythm, no murmurs Lungs:  Bilateral diminished air entry, diffuse rales Abdomen:  Soft, nontender Musculoskeletal:  Moves all extremities, no edema Skin:  Intact  LABS:   Lab 11/09/12  0330 11/08/12 1520 11/08/12 0213  NA 128* 129* 132*  K 3.2* 3.7 2.7*  CL 92* 91* 90*  CO2 26 30 31   BUN 26* 18 14  CREATININE 0.87 0.91 0.78  GLUCOSE 127* 97 119*    Lab 11/09/12 0330 11/08/12 0213 11/07/12 0434  HGB 8.5* 9.3* 8.7*  HCT 24.1* 26.4* 25.5*  WBC 16.1* 13.2* 13.5*  PLT 332 265 218    ABG    Component Value Date/Time   PHART 7.408 11/09/2012 0445   PCO2ART 40.0 11/09/2012 0445   PO2ART 71.9* 11/09/2012 0445   HCO3 24.8* 11/09/2012 0445   TCO2 23.4 11/09/2012 0445   O2SAT 94.8 11/09/2012 0445    IMAGING: Dg Chest Port 1 View  11/09/2012  *RADIOLOGY REPORT*  Clinical Data: Evaluate endotracheal tube placement.  PORTABLE CHEST - 1 VIEW  Comparison: Chest x-ray 11/08/2012.  Findings: An endotracheal tube is in place with tip 3.2 cm above the carina. There is a left-sided internal jugular central venous catheter with tip terminating in the distal superior vena cava. A nasogastric tube is seen extending into the stomach, however, the tip of the nasogastric tube extends below the lower margin of the image.  Lung volumes are low. There is cephalization of the pulmonary vasculature, indistinctness of the interstitial markings, and patchy airspace disease throughout the lungs bilaterally suggestive of moderate pulmonary edema.  Trace bilateral pleural effusions.  Mild cardiomegaly is unchanged. Mediastinal contours are unremarkable allowing for patient positioning.  Atherosclerosis in the thoracic aorta.  IMPRESSION: 1.  Support apparatus, as above. 2.  Persistent cephalization of the pulmonary vasculature and indistinct interstitial markings with patchy air space disease bilaterally, favored to represent pulmonary edema.  Superimposed multifocal infection is not excluded. 3.  Atherosclerosis.   Original Report Authenticated By: Trudie Reed, M.D.    Dg Chest Port 1 View  11/08/2012  *RADIOLOGY REPORT*  Clinical Data: Endotracheal tube placement.  PORTABLE CHEST - 1 VIEW   Comparison: 11/07/2012.  Findings: Endotracheal tube tip at the level of the carina directed towards the right mainstem bronchus.  Recommend retracting by 3 cm.  Left central line tip mid to distal superior vena cava level.  Nasogastric tube courses below the diaphragm.  The tip is not included on this exam.  Diffuse air space disease.  This may represent diffuse pulmonary edema.  Infectious infiltrate not entirely excluded.  No gross pneumothorax.  Cardiomegaly.  Calcified aorta.  IMPRESSION: Endotracheal tube tip at the level of the carina directed towards the right mainstem bronchus.  Recommend retracting by 3 cm.  Left central line tip mid to distal superior vena cava level.  Nasogastric tube courses below the diaphragm.  The tip is not included on this exam.  Diffuse air space disease.  This may represent diffuse pulmonary edema.  Infectious infiltrate not entirely excluded.  No gross pneumothorax.  Cardiomegaly.  Critical Value/emergent results were called by telephone at the time of interpretation on 11/08/2012 at 11:38 a.m. to [facility] patients nurse, who verbally acknowledged these results.   Original Report Authenticated By: Lacy Duverney, M.D.        ECG: 12/8 >>> LVH, NSR 2D: The cavity size was normal. Wall thickness was increased in a pattern of mild LVH. Systolic function was vigorous. The estimated ejection fraction was in the range of 65% to 70%.  DIAGNOSES: Principal Problem:  *Hypoxemia Active Problems:  Degenerative arthritis of hip  Confusion  Fever  S/P total hip arthroplasty  CHF (congestive heart failure)  HCAP (healthcare-associated pneumonia)  Acute respiratory failure  Normocytic anemia  Hypokalemia  Acute diastolic heart failure   ASSESSMENT / PLAN:  PULMONARY  A: Acute respiratory failure, hypoxemic (DDx:  Pneumonitis, HCAP, Pulmonary edema, NSIP, RB-ILD, HP). P:   - 12-10 intubated due to refractory hypoxia. - BD. - Sputum culture. - Steroids. - ESR, CRP,  RF, ANCA, Anti DS DNA. -12-11 changed to APRV ventilation  CARDIOVASCULAR  A: Acute diastolic CHF. Shock P:  - Hold lasix post intubation due to hypotension. - Monitor EKG. -2d with vigorous LV  -transition from neo to Levophed -1 unit PRBC RENAL   Lab 11/09/12 0330 11/08/12 1520 11/08/12 0213  K 3.2* 3.7 2.7*     A:  Hypokalemia P:   - Replete & recheck.  GASTROINTESTINAL  A:  No active issues. P:   - Place OGT. - Start TF.  INFECTIOUS  A:  Suspected HCAP.  PCN allergy. P:   - Antibiotics as above - PCT - Urine Strep / Legionella Ag  Brett Canales Minor ACNP Adolph Pollack PCCM Pager [phone] till 3 pm If no answer page [phone] 11/09/2012, 9:35 AM  I have personally obtained a history, examined the patient, evaluated laboratory and imaging results, formulated the assessment and plan and placed orders.  CRITICAL CARE: The patient is critically ill with multiple organ systems failure and requires high complexity decision making  for assessment and support, frequent evaluation and titration of therapies, application of advanced monitoring technologies and extensive interpretation of multiple databases. Critical Care Time devoted to patient care services described in this note is 60 minutes.   Patient seen and examined, agree with above note.  I dictated the care and orders written for this patient under my direction.  [name], M.D. [phone]

## 2012-11-10 ENCOUNTER — Inpatient Hospital Stay (HOSPITAL_COMMUNITY): Payer: Medicare Other

## 2012-11-10 LAB — TYPE AND SCREEN

## 2012-11-10 LAB — BLOOD GAS, ARTERIAL
Acid-Base Excess: 0.9 mmol/L (ref 0.0–2.0)
Acid-base deficit: 0.6 mmol/L (ref 0.0–2.0)
Acid-base deficit: 1.6 mmol/L (ref 0.0–2.0)
Bicarbonate: 25 mEq/L — ABNORMAL HIGH (ref 20.0–24.0)
Drawn by: 129801
Drawn by: 129801
Drawn by: 336861
FIO2: 0.3 %
O2 Saturation: 98.8 %
O2 Saturation: 95.9 %
PEEP: 0 cmH2O
PEEP: 20 cmH2O
Patient temperature: 37
Pressure control: 25 cmH2O
Pressure support: 10 cmH2O
TCO2: 22.6 mmol/L (ref 0–100)
pCO2 arterial: 44.1 mmHg (ref 35.0–45.0)
pCO2 arterial: 55.3 mmHg — ABNORMAL HIGH (ref 35.0–45.0)
pH, Arterial: 7.347 — ABNORMAL LOW (ref 7.350–7.450)
pO2, Arterial: 104 mmHg — ABNORMAL HIGH (ref 80.0–100.0)

## 2012-11-10 LAB — PHOSPHORUS: Phosphorus: 2.7 mg/dL (ref 2.3–4.6)

## 2012-11-10 LAB — GLUCOSE, CAPILLARY
Glucose-Capillary: 131 mg/dL — ABNORMAL HIGH (ref 70–99)
Glucose-Capillary: 137 mg/dL — ABNORMAL HIGH (ref 70–99)
Glucose-Capillary: 172 mg/dL — ABNORMAL HIGH (ref 70–99)

## 2012-11-10 LAB — CBC
Hemoglobin: 9 g/dL — ABNORMAL LOW (ref 12.0–15.0)
RBC: 2.83 MIL/uL — ABNORMAL LOW (ref 3.87–5.11)

## 2012-11-10 LAB — MAGNESIUM: Magnesium: 2.5 mg/dL (ref 1.5–2.5)

## 2012-11-10 LAB — LEGIONELLA ANTIGEN, URINE: Legionella Antigen, Urine: NEGATIVE

## 2012-11-10 NOTE — Progress Notes (Signed)
ANTIBIOTIC CONSULT NOTE - FOLLOW UP  Pharmacy Consult for Vanc, Levaquin, Aztreonam Indication: HAP  Allergies  Allergen Reactions  . Penicillins Hives and Swelling    Patient Measurements: Height: 5\' 2"  (157.5 cm) Weight: 140 lb 14 oz (63.9 kg) IBW/kg (Calculated) : 50.1   Vital Signs: Temp: 97.3 F (36.3 C) (12/12 1100) Temp src: Core (Comment) (12/12 0800) BP: 119/61 mmHg (12/12 0800) Pulse Rate: 72  (12/12 1100)  Labs:  Basename 11/10/12 0405 11/09/12 1755 11/09/12 0330 11/08/12 1520 11/08/12 0213  WBC 10.2 -- 16.1* -- 13.2*  HGB 9.0* -- 8.5* -- 9.3*  PLT 243 -- 332 -- 265  LABCREA -- -- -- -- --  CREATININE -- 0.77 0.87 0.91 --   Estimated Creatinine Clearance: 58.3 ml/min (by C-G formula based on Cr of 0.77).  Basename 11/10/12 1120 11/08/12 0935  VANCOTROUGH 18.2 5.9*  VANCOPEAK -- --  Drue Dun -- --  GENTTROUGH -- --  GENTPEAK -- --  GENTRANDOM -- --  TOBRATROUGH -- --  TOBRAPEAK -- --  TOBRARND -- --  AMIKACINPEAK -- --  AMIKACINTROU -- --  AMIKACIN -- --    Cultures: 12/10 urine strep/legionella: neg/pending 12/10 trach asp: no org seen 12/11 blood x2: ngtd  Assessment:  71 year old female s/p right THA on 11/03/12 and started having SOB and confusion 12/8. CXR with diffuse bilateral pulmonary infiltrates, started broad spectrum abx for HAP.     Today is day #5 of Vancomycin, Levaquin and Aztreonam.   Vanc trough therapeutic today after dosage increase 12/10 for low trough.  SCr normal and stable, but concern for further accumulation; if not dc'd in 1-2 days, will recheck trough   Goal of Therapy:  Vancomycin trough level 15-20 mcg/ml  Plan:   Continue Vancomycin 1250 mg IV q12h  Continue Levaquin and Aztreonam as ordered  Can therapy be de-escalated?  Loralee Pacas, PharmD, BCPS Pager: [phone] 11/10/2012 12:45 PM

## 2012-11-10 NOTE — Progress Notes (Signed)
PT Cancellation Note  Patient Details Name: [name] [name] MRN: [id number] DOB: 21-Mar-1941   Cancelled Treatment:    Reason Eval/Treat Not Completed: Medical issues which prohibited therapy (sedated on vent)   Rada Hay 11/10/2012, 7:49 AM

## 2012-11-10 NOTE — Procedures (Signed)
Arterial Catheter Insertion Procedure Note [name] [name] [id number] 04-Dec-1940  Procedure: Insertion of Arterial Catheter  Indications: Blood pressure monitoring  Procedure Details Consent: Unable to obtain consent because of emergent medical necessity. Time Out: Verified patient identification, verified procedure, site/side was marked, verified correct patient position, special equipment/implants available, medications/allergies/relevent history reviewed, required imaging and test results available.  Performed  Maximum sterile technique was used including antiseptics, cap, gloves, gown, hand hygiene, mask and sheet. Skin prep: Chlorhexidine; local anesthetic administered 20 gauge catheter was inserted into right radial artery using the Seldinger technique.  Evaluation Blood flow good; BP tracing good. Complications: No apparent complications.  Note for A-line done 11/08/12.  Alyson Reedy, M.D. [facility] Pulmonary/Critical Care Medicine. Pager: [phone]. After hours pager: [phone].

## 2012-11-10 NOTE — Progress Notes (Signed)
Patient ID: [name] [name], female   DOB: Mar 31, 1941, 71 y.o.   MRN: [id number] Slowly looking better.  Does follow simple commands for me.  Intubated still.  Hopefully will continue to improve.

## 2012-11-10 NOTE — Progress Notes (Signed)
PULMONARY  / CRITICAL CARE MEDICINE  Name: [name] [name] MRN: [id number] DOB: 04-19-1941    LOS: 5  REFERRING MD :  TRH  CHIEF COMPLAINT:  Acute respiratory distress  BRIEF PATIENT DESCRIPTION: 71 yo smoker without history of lung disease s/p elective total hip arthroplasty on 12/5, developed hypoxia on 12/8.  CT angio was negative for PE but showed bilateral asymmetric ground glass opacities. BNP 1378 - diuresed, but developed progressive hypoxia. PCCM consulted on 12/9, BiPAP started. TTE - nml LVEF, no WMA. Intubated 12-10  LINES / TUBES: 12-10 7.5 ott 23 @teeth >> 12-10 L i j cvl 20 cm>> 12-10 aline>>  CULTURES: 12-10 sputum>>  ANTIBIOTICS: Aztreonam 12/8 >>> Vancomycin 12/8 >>> Levaquin 12/8 >>>  SIGNIFICANT EVENTS:  12/5  Elective total hip arthroplasty. 12/8  Developed hypoxia, abx began, diuresed. 12/9  TTE - LVF wnl.  Progressive hypoxia, NRB, BiPAP. 12-10 intubation 12-11 Changed to APRV ventilation  VITAL SIGNS: BP 104/49  Pulse 82  Temp 97.7 F (36.5 C) (Core (Comment))  Resp 18  Ht 5\' 2"  (1.575 m)  Wt 63.9 kg (140 lb 14 oz)  BMI 25.77 kg/m2  SpO2 98%  HEMODYNAMICS:  VENTILATOR SETTINGS: Vent Mode:  [-] Bi-Vent FiO2 (%):  [40 %-70 %] 40 % PEEP:  [0 cmH20] 0 cmH20 Pressure Support:  [5 cmH20] 5 cmH20 INTAKE / OUTPUT:  Intake/Output Summary (Last 24 hours) at 11/10/12 0859 Last data filed at 11/10/12 0600  Gross per 24 hour  Intake 3785.43 ml  Output   1090 ml  Net 2695.43 ml         Urine Occurrence 3 x    Stool Occurrence 3 x      PHYSICAL EXAMINATION: General:  Sedated on vent Neuro:  Sedated on vent, mae x 4 when aroused HEENT:  PERRL Cardiovascular:  Irregular rhythm, no murmurs Lungs:  Bilateral diminished air entry, diffuse rales Abdomen:  Soft, nontender Musculoskeletal:  Moves all extremities, no edema Skin:  Intact  LABS:   Lab 11/09/12 1755 11/09/12 0330 11/08/12 1520  NA 128* 128* 129*  K 4.7 3.2* 3.7  CL 94* 92*  91*  CO2 25 26 30   BUN 29* 26* 18  CREATININE 0.77 0.87 0.91  GLUCOSE 160* 127* 97    Lab 11/10/12 0405 11/09/12 0330 11/08/12 0213  HGB 9.0* 8.5* 9.3*  HCT 26.4* 24.1* 26.4*  WBC 10.2 16.1* 13.2*  PLT 243 332 265    ABG    Component Value Date/Time   PHART 7.310* 11/10/2012 0450   PCO2ART 51.8* 11/10/2012 0450   PO2ART 127.0* 11/10/2012 0450   HCO3 25.3* 11/10/2012 0450   TCO2 24.4 11/10/2012 0450   ACIDBASEDEF 0.6 11/10/2012 0450   O2SAT 98.8 11/10/2012 0450    IMAGING: Dg Chest Port 1 View  11/10/2012  *RADIOLOGY REPORT*  Clinical Data: Evaluate endotracheal tube, pulmonary edema  PORTABLE CHEST - 1 VIEW  Comparison: 11/09/2012; 11/07/2012; 11/06/2012; chest CT - 11/06/2012  Findings:  Grossly unchanged borderline enlarged cardiac silhouette and mediastinal contours without grade calcifications of the aortic arch.  Stable position of support apparatus.  No pneumothorax. Minimally improved aeration of the lungs with persistent bilateral heterogeneous opacities.  Pulmonary vasculature remains indistinct. No definite pleural effusion.  Unchanged bones.  IMPRESSION: 1.  Stable positioning of support apparatus.  No pneumothorax. 2.  Minimally improved aeration of the lungs with persistent bilateral air space opacities with differential considerations including pulmonary edema and/or multifocal infection.   Original Report Authenticated By: Tacey Ruiz,  MD    Dg Chest Port 1 View  11/09/2012  *RADIOLOGY REPORT*  Clinical Data: Evaluate endotracheal tube placement.  PORTABLE CHEST - 1 VIEW  Comparison: Chest x-ray 11/08/2012.  Findings: An endotracheal tube is in place with tip 3.2 cm above the carina. There is a left-sided internal jugular central venous catheter with tip terminating in the distal superior vena cava. A nasogastric tube is seen extending into the stomach, however, the tip of the nasogastric tube extends below the lower margin of the image.  Lung volumes are low. There  is cephalization of the pulmonary vasculature, indistinctness of the interstitial markings, and patchy airspace disease throughout the lungs bilaterally suggestive of moderate pulmonary edema. Trace bilateral pleural effusions.  Mild cardiomegaly is unchanged. Mediastinal contours are unremarkable allowing for patient positioning.  Atherosclerosis in the thoracic aorta.  IMPRESSION: 1.  Support apparatus, as above. 2.  Persistent cephalization of the pulmonary vasculature and indistinct interstitial markings with patchy air space disease bilaterally, favored to represent pulmonary edema.  Superimposed multifocal infection is not excluded. 3.  Atherosclerosis.   Original Report Authenticated By: Trudie Reed, M.D.    Dg Chest Port 1 View  11/08/2012  *RADIOLOGY REPORT*  Clinical Data: Endotracheal tube placement.  PORTABLE CHEST - 1 VIEW  Comparison: 11/07/2012.  Findings: Endotracheal tube tip at the level of the carina directed towards the right mainstem bronchus.  Recommend retracting by 3 cm.  Left central line tip mid to distal superior vena cava level.  Nasogastric tube courses below the diaphragm.  The tip is not included on this exam.  Diffuse air space disease.  This may represent diffuse pulmonary edema.  Infectious infiltrate not entirely excluded.  No gross pneumothorax.  Cardiomegaly.  Calcified aorta.  IMPRESSION: Endotracheal tube tip at the level of the carina directed towards the right mainstem bronchus.  Recommend retracting by 3 cm.  Left central line tip mid to distal superior vena cava level.  Nasogastric tube courses below the diaphragm.  The tip is not included on this exam.  Diffuse air space disease.  This may represent diffuse pulmonary edema.  Infectious infiltrate not entirely excluded.  No gross pneumothorax.  Cardiomegaly.  Critical Value/emergent results were called by telephone at the time of interpretation on 11/08/2012 at 11:38 a.m. to [facility] patients nurse, who verbally  acknowledged these results.   Original Report Authenticated By: Lacy Duverney, M.D.        ECG: 12/8 >>> LVH, NSR 2D: The cavity size was normal. Wall thickness was increased in a pattern of mild LVH. Systolic function was vigorous. The estimated ejection fraction was in the range of 65% to 70%.  DIAGNOSES: Principal Problem:  *Hypoxemia Active Problems:  Degenerative arthritis of hip  Confusion  Fever  S/P total hip arthroplasty  CHF (congestive heart failure)  HCAP (healthcare-associated pneumonia)  Acute respiratory failure  Normocytic anemia  Hypokalemia  Acute diastolic heart failure   ASSESSMENT / PLAN:  PULMONARY  A: Acute respiratory failure, hypoxemic (DDx:  Pneumonitis, HCAP, Pulmonary edema, NSIP, RB-ILD, HP). P:   - 12-10 intubated due to refractory hypoxia. - BD. - Sputum culture. - Steroids. - ESR, CRP, RF, ANCA, Anti DS DNA. -12-11 changed to APRV ventilation. 12-12 fio2 down to 40%  CARDIOVASCULAR  A: Acute diastolic CHF. Shock P:  - Hold lasix post intubation due to hypotension. - Monitor EKG. -2d with vigorous LV  -transition from neo to Levophed 12-12 off all pressors -1 unit PRBC -12-12 may need diuresis once  sepsis is resolved. RENAL   Lab 11/09/12 1755 11/09/12 0330 11/08/12 1520  K 4.7 3.2* 3.7     A:  Hypokalemia P:   - Replete & recheck as needed  GASTROINTESTINAL  A:  No active issues. P:   - Place OGT. - Start TF.12-12 hold for high residuals, start Reglan IV  INFECTIOUS  A:  Suspected HCAP.  PCN allergy. P:   - Antibiotics as above - PCT 1.59 - Urine Strep / Legionella Ag  Brett Canales Minor ACNP [facility] PCCM Pager [phone] till 3 pm If no answer page [phone] 11/10/2012, 8:59 AM  I have personally obtained a history, examined the patient, evaluated laboratory and imaging results, formulated the assessment and plan and placed orders.  Would like to start diureses but patient just got off pressors.  Will decrease  P high to 20 and increase PS to 10 with f/u ABG.  Will KVO IVF however.  Patient will need workup for speculated mass on the left upper lobe.  Continue abx in the meantime.  Family updated at length bedside.  CRITICAL CARE: The patient is critically ill with multiple organ systems failure and requires high complexity decision making for assessment and support, frequent evaluation and titration of therapies, application of advanced monitoring technologies and extensive interpretation of multiple databases. Critical Care Time devoted to patient care services described in this note is 35 minutes.   Alyson Reedy, M.D. [facility] Pulmonary/Critical Care Medicine. Pager: [phone]. After hours pager: [phone].

## 2012-11-11 ENCOUNTER — Inpatient Hospital Stay (HOSPITAL_COMMUNITY): Payer: Medicare Other

## 2012-11-11 LAB — BASIC METABOLIC PANEL
BUN: 29 mg/dL — ABNORMAL HIGH (ref 6–23)
Calcium: 9.1 mg/dL (ref 8.4–10.5)
Creatinine, Ser: 0.69 mg/dL (ref 0.50–1.10)
GFR calc Af Amer: >90 mL/min (ref >90)
GFR calc non Af Amer: 87 mL/min — ABNORMAL LOW (ref >90)

## 2012-11-11 LAB — CBC
MCH: 31.3 pg (ref 26.0–34.0)
MCHC: 33.1 g/dL (ref 30.0–36.0)
Platelets: 315 10*3/uL (ref 150–400)
RBC: 2.91 MIL/uL — ABNORMAL LOW (ref 3.87–5.11)
RDW: 15.5 % (ref 11.5–15.5)

## 2012-11-11 LAB — BLOOD GAS, ARTERIAL
Acid-Base Excess: 0.2 mmol/L (ref 0.0–2.0)
Acid-base deficit: 6.4 mmol/L — ABNORMAL HIGH (ref 0.0–2.0)
Acid-base deficit: 6.4 mmol/L — ABNORMAL HIGH (ref 0.0–2.0)
Bicarbonate: 24.7 mEq/L — ABNORMAL HIGH (ref 20.0–24.0)
Bicarbonate: 25.4 mEq/L — ABNORMAL HIGH (ref 20.0–24.0)
FIO2: 0.3 %
FIO2: 0.3 %
O2 Saturation: 95.2 %
TCO2: 17.3 mmol/L (ref 0–100)
TCO2: 17.3 mmol/L (ref 0–100)
TCO2: 23.3 mmol/L (ref 0–100)
TCO2: 23.6 mmol/L (ref 0–100)
pCO2 arterial: 31.1 mmHg — ABNORMAL LOW (ref 35.0–45.0)
pCO2 arterial: 31.1 mmHg — ABNORMAL LOW (ref 35.0–45.0)
pCO2 arterial: 44.5 mmHg (ref 35.0–45.0)
pCO2 arterial: 45.4 mmHg — ABNORMAL HIGH (ref 35.0–45.0)
pH, Arterial: 7.365 (ref 7.350–7.450)
pH, Arterial: 7.384 (ref 7.350–7.450)
pO2, Arterial: 70.4 mmHg — ABNORMAL LOW (ref 80.0–100.0)

## 2012-11-11 LAB — GLUCOSE, CAPILLARY
Glucose-Capillary: 124 mg/dL — ABNORMAL HIGH (ref 70–99)
Glucose-Capillary: 138 mg/dL — ABNORMAL HIGH (ref 70–99)

## 2012-11-11 LAB — URINE CULTURE

## 2012-11-11 LAB — CULTURE, RESPIRATORY W GRAM STAIN

## 2012-11-11 LAB — TRIGLYCERIDES: Triglycerides: 161 mg/dL — ABNORMAL HIGH (ref <150)

## 2012-11-11 LAB — PHOSPHORUS: Phosphorus: 2.2 mg/dL — ABNORMAL LOW (ref 2.3–4.6)

## 2012-11-11 MED ORDER — SODIUM PHOSPHATE 3 MMOLE/ML IV SOLN
30.0000 mmol | Freq: Once | INTRAVENOUS | Status: AC
  Administered 2012-11-11: 30 mmol via INTRAVENOUS
  Filled 2012-11-11: qty 10

## 2012-11-11 MED ORDER — POTASSIUM CHLORIDE 20 MEQ/15ML (10%) PO LIQD
40.0000 meq | Freq: Once | ORAL | Status: AC
  Administered 2012-11-11: 40 meq
  Filled 2012-11-11: qty 30

## 2012-11-11 MED ORDER — METOLAZONE 5 MG PO TABS
5.0000 mg | ORAL_TABLET | Freq: Every day | ORAL | Status: AC
  Administered 2012-11-11: 5 mg via ORAL
  Filled 2012-11-11: qty 1

## 2012-11-11 MED ORDER — FUROSEMIDE 10 MG/ML IJ SOLN
10.0000 mg/h | INTRAVENOUS | Status: AC
  Administered 2012-11-11: 10 mg/h via INTRAVENOUS
  Filled 2012-11-11: qty 25

## 2012-11-11 NOTE — Plan of Care (Signed)
Problem: Phase I Progression Outcomes Goal: Patient tolerating nututrition at goal Outcome: Not Met (add Reason) Tube feeds on hold due to residuals >453ml for >12 hours

## 2012-11-11 NOTE — Progress Notes (Signed)
eLink Physician-Brief Progress Note Patient Name: Loda Bialas DOB: 1941/02/28 MRN: [id number]  Date of Service  11/11/2012   HPI/Events of Note   Hypophosphatemia  eICU Interventions  Phos replaced   Intervention Category Minor Interventions: Electrolytes abnormality - evaluation and management  Alix Stowers 11/11/2012, 6:19 AM

## 2012-11-11 NOTE — Progress Notes (Signed)
Patient ID: [name] [name], female   DOB: May 02, 1941, 71 y.o.   MRN: [id number] Still intubated, but settings on ventilator not as high.  However, it sounds like she may be too weak in her efforts to extubate just yet.  I really appreciate Critical Care guiding her care.

## 2012-11-11 NOTE — Clinical Documentation Improvement (Signed)
POA DOCUMENTATION CLARIFICATION QUERY  THIS DOCUMENT IS NOT A PERMANENT PART OF THE MEDICAL RECORD  TO RESPOND TO THE THIS QUERY, FOLLOW THE INSTRUCTIONS BELOW:  1. If needed, update documentation for the patient's encounter via the notes activity.  2. Access this query again and click edit on the In [employer].  3. After updating, or not, click F2 to complete all highlighted (required) fields concerning your review. Select "additional documentation in the medical record" OR "no additional documentation provided".  4. Click Sign note button.  5. The deficiency will fall out of your In Basket *Please let us know if you are not able to complete this workflow by phone or e-mail (listed below).  Please update your documentation within the medical record to reflect your response to this query.                                                                                    11/11/12  Dear Dr. Alyson Reedy,    In a better effort to capture your patient's severity of illness, reflect appropriate length of stay and utilization of resources, a review of the patient medical record has revealed the following indicators.    Based on your clinical judgment, please clarify and document in a progress note and/or discharge summary the clinical condition associated with the following supporting information:  In responding to this query please exercise your independent judgment.  The fact that a query is asked, does not imply that any particular answer is desired or expected.  Medicare rules require specification as to whether an inpatient diagnosis was present at the time of admission.  Please clarify if the following diagnosis,  Based on your clinical judgment can you provide a more specific diagnosis that represents the below listed clinical indicators?  Clarification Needed:  In this patient with diagnosis of shock per pn 11/10/12 please respond to the following:   Clarify the type of  shock and document in pn and d/c summary  Was the condition present on admission (POA)  Please document your response in pn or d/c summary  Possible Conditions  Cardiogenic Shock Hypovolemic Shock Septic Shock Surgical/ Post Op shock  _ Unable to clinically determine whether the condition was present on admission.  _  Documentation insufficient to determine if condition was present at the time of inpatient admission   Risk Factors: End stage arthritis, right hip(s), Acute Respiratory Failure, HtN, ARDS, PNA, Pneumonitis, Acute diastolic CHF,  Hypokalemia, ? Aspiration, anemia, s/p Right THA  Signs/Symptoms Pt intubation, pt hypotensive,   PN 11/10/12 Acute diastolic CHF. Shock P:   - Hold lasix post intubation due to hypotension. - Monitor EKG. -2d with vigorous LV   -transition from neo to Levophed -1 unit PRBC    Diagnostics: B/P: 98/45 and 97/47 Component     Latest Ref Rng 11/05/2012 11/06/2012          4:00 AM  WBC     4.0 - 10.5 K/uL 14.6 (H) 14.9 (H)   Component     Latest Ref Rng 11/07/2012         4:34 AM  WBC     4.0 -  10.5 K/uL 13.5 (H)   Component     Latest Ref Rng 11/09/2012         3:30 AM  WBC     4.0 - 10.5 K/uL 16.1 (H)   Component     Latest Ref Rng 11/09/2012         4:45 AM  pO2, Arterial     80.0 - 100.0 mmHg 71.9 (L)  Bicarbonate     20.0 - 24.0 mEq/L 24.8 (H)   Component     Latest Ref Rng 11/09/2012         9:35 AM  pH, Arterial     7.350 - 7.450 7.336 (L)  pCO2 arterial     35.0 - 45.0 mmHg 49.1 (H)   Component     Latest Ref Rng 11/09/2012 11/09/2012        10:50 AM  5:48 PM  pH, Arterial     7.350 - 7.450 7.347 (L) 7.247 (L)  pCO2 arterial     35.0 - 45.0 mmHg 45.1 (H) 58.1 (HH)  pO2, Arterial     80.0 - 100.0 mmHg 76.6 (L) 111.0 (H)  Bicarbonate     20.0 - 24.0 mEq/L 24.1 (H) 24.4 (H)   Component     Latest Ref Rng 11/06/2012         4:00 AM  Pro B Natriuretic peptide (BNP)     0 - 125 pg/mL 1378.0  (H)   Component     Latest Ref Rng 11/08/2012         2:13 AM  Pro B Natriuretic peptide (BNP)     0 - 125 pg/mL 1066.0 (H)   Component     Latest Ref Rng 11/05/2012 11/06/2012          4:00 AM  Hemoglobin     12.0 - 15.0 g/dL 9.8 (L) 9.7 (L)  HCT     36.0 - 46.0 % 29.6 (L) 27.6 (L)   Treatment:  Vancomycin Levaquin Transitioned from Neo to Levophed 1 Unit of PRBCs  You may use possible, probable, or suspect with inpatient documentation. possible, probable, suspected diagnoses MUST be documented at the time of discharge  Reviewed:  no additional documentation provided ljh      Thank You,  Enis Slipper  RN, BSN, MSN/Inf, CCDS Clinical Documentation Specialist [facility] Dept Pager: [phone] / E-mail: [email or web address]  Health Information Management [place]

## 2012-11-11 NOTE — Progress Notes (Signed)
PULMONARY  / CRITICAL CARE MEDICINE  Name: Kyann Heydt MRN: [id number] DOB: 20-Jul-1941    LOS: 5  REFERRING MD :  TRH  CHIEF COMPLAINT:  Acute respiratory distress  BRIEF PATIENT DESCRIPTION: 71 yo smoker without history of lung disease s/p elective total hip arthroplasty on 12/5, developed hypoxia on 12/8.  CT angio was negative for PE but showed bilateral asymmetric ground glass opacities. BNP 1378 - diuresed, but developed progressive hypoxia. PCCM consulted on 12/9, BiPAP started. TTE - nml LVEF, no WMA. Intubated 12-10  LINES / TUBES: 12-10 7.5 ott 23 @teeth >> 12-10 L i j cvl 20 cm>> 12-10 aline>>  CULTURES: 12-10 sputum>>>NTD Urine 12/10>>>NTD Sputum 12/10>>>NTD  ANTIBIOTICS: Aztreonam 12/8 >>> Vancomycin 12/8 >>> Levaquin 12/8 >>>  SIGNIFICANT EVENTS:  12/5  Elective total hip arthroplasty. 12/8  Developed hypoxia, abx began, diuresed. 12/9  TTE - LVF wnl.  Progressive hypoxia, NRB, BiPAP. 12-10 intubation 12-11 Changed to APRV ventilation  VITAL SIGNS: BP 153/93  Pulse 94  Temp 97 F (36.1 C) (Core (Comment))  Resp 21  Ht 5\' 2"  (1.575 m)  Wt 63.9 kg (140 lb 14 oz)  BMI 25.77 kg/m2  SpO2 93%  HEMODYNAMICS:  VENTILATOR SETTINGS: Vent Mode:  [-] Bi-Vent FiO2 (%):  [30 %] 30 % PEEP:  [0 cmH20-5 cmH20] 0 cmH20 Pressure Support:  [5 cmH20-20 cmH20] 20 cmH20 Plateau Pressure:  [24 cmH20] 24 cmH20 INTAKE / OUTPUT:  Intake/Output Summary (Last 24 hours) at 11/11/12 1350 Last data filed at 11/11/12 1300  Gross per 24 hour  Intake   2201 ml  Output   1480 ml  Net    721 ml        Urine Occurrence 3 x    Stool Occurrence 3 x     PHYSICAL EXAMINATION: General:  Sedated on vent, very agitated when off vent. Neuro:  Sedated on vent, mae x 4 when aroused HEENT:  PERRL Cardiovascular:  Irregular rhythm, no murmurs Lungs:  Bilateral diminished air entry, diffuse rales Abdomen:  Soft, nontender Musculoskeletal:  Moves all extremities, no edema Skin:   Intact  LABS:  Lab 11/11/12 0455 11/09/12 1755 11/09/12 0330  NA 136 128* 128*  K 4.9 4.7 3.2*  CL 103 94* 92*  CO2 26 25 26   BUN 29* 29* 26*  CREATININE 0.69 0.77 0.87  GLUCOSE 128* 160* 127*    Lab 11/11/12 0455 11/10/12 0405 11/09/12 0330  HGB 9.1* 9.0* 8.5*  HCT 27.5* 26.4* 24.1*  WBC 15.2* 10.2 16.1*  PLT 315 243 332   ABG    Component Value Date/Time   PHART 7.374 11/11/2012 1752   PCO2ART 31.1* 11/11/2012 1752   PO2ART 69.4* 11/11/2012 1752   HCO3 17.7* 11/11/2012 1752   TCO2 17.3 11/11/2012 1752   ACIDBASEDEF 6.4* 11/11/2012 1752   O2SAT 94.2 11/11/2012 1752   IMAGING: Dg Chest Port 1 View  11/11/2012  *RADIOLOGY REPORT*  Clinical Data: Respiratory difficulty  PORTABLE CHEST - 1 VIEW  Comparison: Yesterday  Findings: Endotracheal tube, NG tube, left internal jugular central venous catheters stable.  Hazy diffuse bilateral airspace disease stable.  Upper normal heart size.  No pneumothorax.  IMPRESSION: Stable bilateral airspace disease.   Original Report Authenticated By: Jolaine Click, M.D.    Dg Chest Port 1 View  11/10/2012  *RADIOLOGY REPORT*  Clinical Data: Evaluate endotracheal tube, pulmonary edema  PORTABLE CHEST - 1 VIEW  Comparison: 11/09/2012; 11/07/2012; 11/06/2012; chest CT - 11/06/2012  Findings:  Grossly unchanged borderline enlarged cardiac  silhouette and mediastinal contours without grade calcifications of the aortic arch.  Stable position of support apparatus.  No pneumothorax. Minimally improved aeration of the lungs with persistent bilateral heterogeneous opacities.  Pulmonary vasculature remains indistinct. No definite pleural effusion.  Unchanged bones.  IMPRESSION: 1.  Stable positioning of support apparatus.  No pneumothorax. 2.  Minimally improved aeration of the lungs with persistent bilateral air space opacities with differential considerations including pulmonary edema and/or multifocal infection.   Original Report Authenticated By: Tacey Ruiz, MD    ECG: 12/8 >>> LVH, NSR 2D: The cavity size was normal. Wall thickness was increased in a pattern of mild LVH. Systolic function was vigorous. The estimated ejection fraction was in the range of 65% to 70%.  DIAGNOSES: Principal Problem:  *Hypoxemia Active Problems:  Degenerative arthritis of hip  Confusion  Fever  S/P total hip arthroplasty  CHF (congestive heart failure)  HCAP (healthcare-associated pneumonia)  Acute respiratory failure  Normocytic anemia  Hypokalemia  Acute diastolic heart failure  ASSESSMENT / PLAN:  PULMONARY  A: Acute respiratory failure, hypoxemic (DDx:  Pneumonitis, HCAP, Pulmonary edema, NSIP, RB-ILD, HP). P:   - 12-10 intubated due to refractory hypoxia. - BD. - Sputum culture. - Steroids. - ESR, CRP, RF, ANCA, Anti DS DNA. - Tolerating APRV, will decrease to P15/0 and T4.1/0.4, if tolerated when P12/0 and T4.6/0.4, if tolerated then 10/5 then 5.1/0.4 and would leave on that overnight.  In AM, decrease to P8/0 and T5.6/0.4, if tolerated then P5/0/6.1.  All above are with a PS of 5.  If the last setting is tolerated then this is essentially CPAP with PS then patient would be ready to extubate assuming no need to increase O2. - Aggressive diureses.  CARDIOVASCULAR  A: Acute diastolic CHF. Shock P:  - Aggressive diureses with lasix drip today. - Monitor EKG. - 2d with vigorous LV. - D/C pressors. - 1 unit PRBC.  RENAL  Lab 11/11/12 0455 11/09/12 1755 11/09/12 0330  K 4.9 4.7 3.2*   A:  Hypokalemia P:   - Replete & recheck as needed.  GASTROINTESTINAL  A:  No active issues. P:   - Placed OGT. - Resume TF.  INFECTIOUS  A:  Suspected HCAP.  PCN allergy. P:   - Antibiotics as above. - PCT 1.59. - Urine Strep/Legionella Ag pending.  I have personally obtained a history, examined the patient, evaluated laboratory and imaging results, formulated the assessment and plan and placed orders.  Family updated bedside, weaning  from APRV as above.  CRITICAL CARE: The patient is critically ill with multiple organ systems failure and requires high complexity decision making for assessment and support, frequent evaluation and titration of therapies, application of advanced monitoring technologies and extensive interpretation of multiple databases. Critical Care Time devoted to patient care services described in this note is 35 minutes.   Alyson Reedy, M.D. [facility] Pulmonary/Critical Care Medicine. Pager: [phone]. After hours pager: [phone].

## 2012-11-12 ENCOUNTER — Inpatient Hospital Stay (HOSPITAL_COMMUNITY): Payer: Medicare Other

## 2012-11-12 LAB — CBC
HCT: 28.3 % — ABNORMAL LOW (ref 36.0–46.0)
Hemoglobin: 9.5 g/dL — ABNORMAL LOW (ref 12.0–15.0)
MCHC: 33.6 g/dL (ref 30.0–36.0)
MCV: 93.1 fL (ref 78.0–100.0)
RDW: 15.1 % (ref 11.5–15.5)
WBC: 13 10*3/uL — ABNORMAL HIGH (ref 4.0–10.5)

## 2012-11-12 LAB — BLOOD GAS, ARTERIAL
Acid-Base Excess: 13.5 mmol/L — ABNORMAL HIGH (ref 0.0–2.0)
Bicarbonate: 30.8 mEq/L — ABNORMAL HIGH (ref 20.0–24.0)
Bicarbonate: 38.6 mEq/L — ABNORMAL HIGH (ref 20.0–24.0)
Drawn by: 308601
Drawn by: 275531
Drawn by: 275531
FIO2: 0.3 %
FIO2: 0.4 %
MECHVT: 500 mL
O2 Saturation: 93.3 %
O2 Saturation: 98.8 %
PEEP: 8 cmH2O
PEEP: 8 cmH2O
Patient temperature: 98.6
Pressure support: 15 cmH2O
RATE: 18 resp/min
RATE: 15 resp/min
TCO2: 30.6 mmol/L (ref 0–100)
pCO2 arterial: 51.1 mmHg — ABNORMAL HIGH (ref 35.0–45.0)
pCO2 arterial: 53.4 mmHg — ABNORMAL HIGH (ref 35.0–45.0)
pCO2 arterial: 53.7 mmHg — ABNORMAL HIGH (ref 35.0–45.0)
pH, Arterial: 7.392 (ref 7.350–7.450)
pH, Arterial: 7.405 (ref 7.350–7.450)
pH, Arterial: 7.473 — ABNORMAL HIGH (ref 7.350–7.450)
pO2, Arterial: 144 mmHg — ABNORMAL HIGH (ref 80.0–100.0)
pO2, Arterial: 65.1 mmHg — ABNORMAL LOW (ref 80.0–100.0)
pO2, Arterial: 89.5 mmHg (ref 80.0–100.0)

## 2012-11-12 LAB — GLUCOSE, CAPILLARY
Glucose-Capillary: 124 mg/dL — ABNORMAL HIGH (ref 70–99)
Glucose-Capillary: 140 mg/dL — ABNORMAL HIGH (ref 70–99)
Glucose-Capillary: 150 mg/dL — ABNORMAL HIGH (ref 70–99)
Glucose-Capillary: 147 mg/dL — ABNORMAL HIGH (ref 70–99)

## 2012-11-12 LAB — BASIC METABOLIC PANEL
BUN: 31 mg/dL — ABNORMAL HIGH (ref 6–23)
Chloride: 100 mEq/L (ref 96–112)
Creatinine, Ser: 0.8 mg/dL (ref 0.50–1.10)
GFR calc Af Amer: 85 mL/min — ABNORMAL LOW (ref >90)
Glucose, Bld: 136 mg/dL — ABNORMAL HIGH (ref 70–99)

## 2012-11-12 MED ORDER — FENTANYL CITRATE 0.05 MG/ML IJ SOLN: 25.0000 ug | INTRAMUSCULAR | Status: DC | PRN

## 2012-11-12 MED ORDER — MIDAZOLAM HCL 2 MG/2ML IJ SOLN: 2.0000 mg | INTRAMUSCULAR | Status: DC | PRN

## 2012-11-12 MED ORDER — AMLODIPINE BESYLATE 5 MG PO TABS
5.0000 mg | ORAL_TABLET | Freq: Every day | ORAL | Status: DC
  Administered 2012-11-12 – 2012-11-16 (×5): 5 mg via ORAL
  Filled 2012-11-12 (×5): qty 1

## 2012-11-12 MED ORDER — POTASSIUM CHLORIDE 20 MEQ/15ML (10%) PO LIQD
40.0000 meq | Freq: Once | ORAL | Status: AC
  Administered 2012-11-12: 40 meq
  Filled 2012-11-12: qty 30

## 2012-11-12 MED ORDER — MAGNESIUM SULFATE 50 % IJ SOLN
2.0000 g | Freq: Once | INTRAVENOUS | Status: AC
  Administered 2012-11-12: 2 g via INTRAVENOUS
  Filled 2012-11-12: qty 4

## 2012-11-12 MED ORDER — AMLODIPINE 1 MG/ML ORAL SUSPENSION: 5.0000 mg | Freq: Every day | ORAL | Status: DC

## 2012-11-12 MED ORDER — DEXMEDETOMIDINE HCL IN NACL 400 MCG/100ML IV SOLN
0.2000 ug/kg/h | INTRAVENOUS | Status: DC
  Administered 2012-11-12: 0.2 ug/kg/h via INTRAVENOUS
  Administered 2012-11-12: 0.8 ug/kg/h via INTRAVENOUS
  Administered 2012-11-12: 0.4 ug/kg/h via INTRAVENOUS
  Filled 2012-11-12 (×2): qty 100
  Filled 2012-11-12: qty 50

## 2012-11-12 NOTE — Progress Notes (Signed)
PULMONARY  / CRITICAL CARE MEDICINE  Name: [name] [name] MRN: [id number] DOB: 03-09-1941    LOS: 5  REFERRING MD :  TRH  CHIEF COMPLAINT:  Acute respiratory distress  BRIEF PATIENT DESCRIPTION: 71 yo smoker without history of lung disease s/p elective total hip arthroplasty on 12/5, developed hypoxia on 12/8.  CT angio was negative for PE but showed bilateral asymmetric ground glass opacities. BNP 1378 - diuresed, but developed progressive hypoxia. PCCM consulted on 12/9, BiPAP started. TTE - nml LVEF, no WMA. Intubated 12-10  LINES / TUBES: 12-10 7.5 ott 23 @teeth >> 12-10 L i j cvl 20 cm>> 12-10 aline>>  CULTURES: 12-10 sputum>>>NTD Urine 12/10>>>NTD Sputum 12/10>>>NTD  Results for orders placed during the hospital encounter of 11/03/12  CULTURE, RESPIRATORY     Status: Normal   Collection Time   11/08/12  2:07 PM      Component Value Range Status Comment   Specimen Description TRACHEAL ASPIRATE   Final    Special Requests NONE   Final    Gram Stain     Final    Value: FEW WBC PRESENT,BOTH PMN AND MONONUCLEAR     RARE SQUAMOUS EPITHELIAL CELLS PRESENT     NO ORGANISMS SEEN   Culture Non-Pathogenic Oropharyngeal-type Flora Isolated.   Final    Report Status 11/11/2012 FINAL   Final   CULTURE, BLOOD (ROUTINE X 2)     Status: Normal (Preliminary result)   Collection Time   11/09/12 10:05 AM      Component Value Range Status Comment   Specimen Description BLOOD LEFT ARM   Final    Special Requests BOTTLES DRAWN AEROBIC AND ANAEROBIC 5CC   Final    Culture  Setup Time 11/09/2012 14:09   Final    Culture     Final    Value:        BLOOD CULTURE RECEIVED NO GROWTH TO DATE CULTURE WILL BE HELD FOR 5 DAYS BEFORE ISSUING A FINAL NEGATIVE REPORT   Report Status PENDING   Incomplete   CULTURE, BLOOD (ROUTINE X 2)     Status: Normal (Preliminary result)   Collection Time   11/09/12 10:10 AM      Component Value Range Status Comment   Specimen Description BLOOD LEFT HAND   Final     Special Requests BOTTLES DRAWN AEROBIC AND ANAEROBIC 5CC   Final    Culture  Setup Time 11/09/2012 14:09   Final    Culture     Final    Value:        BLOOD CULTURE RECEIVED NO GROWTH TO DATE CULTURE WILL BE HELD FOR 5 DAYS BEFORE ISSUING A FINAL NEGATIVE REPORT   Report Status PENDING   Incomplete   URINE CULTURE     Status: Normal   Collection Time   11/09/12 10:34 AM      Component Value Range Status Comment   Specimen Description URINE, CLEAN CATCH   Final    Special Requests NONE   Final    Culture  Setup Time 11/10/2012 01:27   Final    Colony Count NO GROWTH   Final    Culture NO GROWTH   Final    Report Status 11/11/2012 FINAL   Final     Lab 11/09/12 0330 11/08/12 0213 11/07/12 2308  PROCALCITON 1.59 0.66 0.51     ANTIBIOTICS: Aztreonam 12/8 >>> Vancomycin 12/8 >>> Levaquin 12/8 >>>  Anti-infectives     Start     Dose/Rate Route  Frequency Ordered Stop   11/08/12 1200   vancomycin (VANCOCIN) 1,250 mg in sodium chloride 0.9 % 250 mL IVPB        1,250 mg 166.7 mL/hr over 90 Minutes Intravenous Every 12 hours 11/08/12 1103     11/06/12 2200   vancomycin (VANCOCIN) 500 mg in sodium chloride 0.9 % 100 mL IVPB  Status:  Discontinued        500 mg 100 mL/hr over 60 Minutes Intravenous Every 12 hours 11/06/12 0833 11/08/12 1102   11/06/12 1000   aztreonam (AZACTAM) 1 g in dextrose 5 % 50 mL IVPB        1 g 100 mL/hr over 30 Minutes Intravenous 3 times per day 11/06/12 0835     11/06/12 0930   levofloxacin (LEVAQUIN) IVPB 750 mg        750 mg 100 mL/hr over 90 Minutes Intravenous Every 24 hours 11/06/12 0833     11/06/12 0900   vancomycin (VANCOCIN) 750 mg in sodium chloride 0.9 % 150 mL IVPB        750 mg 150 mL/hr over 60 Minutes Intravenous  Once 11/06/12 0833 11/06/12 1013   11/03/12 1400   clindamycin (CLEOCIN) IVPB 600 mg        600 mg 100 mL/hr over 30 Minutes Intravenous Every 6 hours 11/03/12 1326 11/03/12 2031   11/03/12 0529   clindamycin (CLEOCIN)  IVPB 900 mg        900 mg 100 mL/hr over 30 Minutes Intravenous 60 min pre-op 11/03/12 0529 11/03/12 0732           SIGNIFICANT EVENTS:  12/5  Elective total hip arthroplasty. 12/8  Developed hypoxia, abx began, diuresed. 12/9  TTE - LVF wnl.  Progressive hypoxia, NRB, BiPAP. 12-10 intubation 12-11 Changed to APRV ventilation  SUBJECTIVE/OVERNIGHT/INTERVAL HX 11/12/12: Meets sBT criteria (currently on APRV) but CXR shows diffuse ALI still and gets very agitated on WUA (neednig high doses of versed gtt)   VITAL SIGNS: BP 118/61  Pulse 98  Temp 98.6 F (37 C) (Axillary)  Resp 15  Ht 5\' 2"  (1.575 m)  Wt 62.9 kg (138 lb 10.7 oz)  BMI 25.36 kg/m2  SpO2 97%  HEMODYNAMICS:  VENTILATOR SETTINGS: Vent Mode:  [-] Bi-Vent FiO2 (%):  [30 %-40 %] 40 % PEEP:  [0 cmH20] 0 cmH20 Pressure Support:  [20 cmH20] 20 cmH20 Plateau Pressure:  [24 cmH20] 24 cmH20 INTAKE / OUTPUT:       Intake/Output      12/13 0701 - 12/14 0700 12/14 0701 - 12/15 0700   I.V. (mL/kg) 445.7 (7.1) 90.5 (1.4)   Other 1170 200   IV Piggyback 1064 150   Total Intake(mL/kg) 2679.7 (42.6) 440.5 (7)   Urine (mL/kg/hr) 5200 (3.4) 1500 (5.6)   Total Output 5200 1500   Net -2520.3 -1059.5        Stool Occurrence 2 x          PHYSICAL EXAMINATION: General:  Sedated on vent, very agitated when off vent. Neuro:  Sedated on vent, mae x 4 when aroused HEENT:  PERRL Cardiovascular:  Irregular rhythm, no murmurs Lungs:  Bilateral diminished air entry, diffuse rales Abdomen:  Soft, nontender Musculoskeletal:  Moves all extremities, no edema Skin:  Intact  LABS:  Lab 11/12/12 0440 11/11/12 0455 11/09/12 1755  NA 141 136 128*  K 3.7 4.9 4.7  CL 100 103 94*  CO2 34* 26 25  BUN 31* 29* 29*  CREATININE 0.80 0.69 0.77  GLUCOSE 136* 128* 160*    Lab 11/12/12 0440 11/11/12 0455 11/10/12 0405  HGB 9.5* 9.1* 9.0*  HCT 28.3* 27.5* 26.4*  WBC 13.0* 15.2* 10.2  PLT 387 315 243   ABG    Component Value  Date/Time   PHART 7.405 11/12/2012 0445   PCO2ART 53.7* 11/12/2012 0445   PO2ART 71.0* 11/12/2012 0445   HCO3 32.9* 11/12/2012 0445   TCO2 30.6 11/12/2012 0445   ACIDBASEDEF 6.4* 11/11/2012 1752   O2SAT 94.4 11/12/2012 0445   IMAGING: Dg Chest Port 1 View  11/12/2012  *RADIOLOGY REPORT*  Clinical Data: ET tube position  PORTABLE CHEST - 1 VIEW  Comparison: 11/11/2012  Findings: Endotracheal tube terminates 4 cm above the carina.  Stable interstitial/airspace opacities bilaterally, favoring interstitial edema, less likely multifocal infection. No pleural effusion or pneumothorax.  Stable mild cardiomegaly.  Left IJ venous catheter with the tip in the mid SVC.  Enteric tube has been withdrawn and now terminates in the mid/upper esophagus.  IMPRESSION: Endotracheal tube terminates 4 cm above the carina.  Stable interstitial/airspace opacities bilaterally, favoring interstitial edema, less likely multifocal infection.  Enteric tube has been withdrawn and now terminates in the mid/upper esophagus.  Advancement is suggested.   Original Report Authenticated By: Charline Bills, M.D.    Dg Chest Port 1 View  11/11/2012  *RADIOLOGY REPORT*  Clinical Data: Respiratory difficulty  PORTABLE CHEST - 1 VIEW  Comparison: Yesterday  Findings: Endotracheal tube, NG tube, left internal jugular central venous catheters stable.  Hazy diffuse bilateral airspace disease stable.  Upper normal heart size.  No pneumothorax.  IMPRESSION: Stable bilateral airspace disease.   Original Report Authenticated By: Jolaine Click, M.D.    ECG: 12/8 >>> LVH, NSR 2D: The cavity size was normal. Wall thickness was increased in a pattern of mild LVH. Systolic function was vigorous. The estimated ejection fraction was in the range of 65% to 70%.  DIAGNOSES: Principal Problem:  *Hypoxemia Active Problems:  Degenerative arthritis of hip  Confusion  Fever  S/P total hip arthroplasty  CHF (congestive heart failure)  HCAP  (healthcare-associated pneumonia)  Acute respiratory failure  Normocytic anemia  Hypokalemia  Acute diastolic heart failure  ASSESSMENT / PLAN:  PULMONARY  A: Acute respiratory failure, hypoxemic (DDx:  ARDS, Pneumonitis, HCAP, Pulmonary edema, NSIP, RB-ILD, HP). On 11/12/2012: Doing well with APRV. Meets SBT criteria but not extubation criteria  P:   - SBT as tolerated but no extubation - awiat autiommne from 11/08/12  - await cultures - Steroids. - Tolerating APRV, will decrease to P15/0 and T4.1/0.4, if tolerated when P12/0 and T4.6/0.4, if tolerated then 10/5 then 5.1/0.4 and would leave on that overnight.  In AM, decrease to P8/0 and T5.6/0.4, if tolerated then P5/0/6.1.  All above are with a PS of 5.  If the last setting is tolerated then this is essentially CPAP with PS then patient would be ready to extubate assuming no need to increase O2. - Aggressive diureses.  CARDIOVASCULAR  Lab 11/08/12 0213 11/07/12 0434 11/06/12 0400  PROBNP 1066.0* 661.6* 1378.0*    A: Acute diastolic CHF. Shock resolved On 11/12/2012: not on pressors  P:  - Aggressive diureses with lasix drip . - Monitor EKG.   RENAL  Lab 11/12/12 0440 11/11/12 0455 11/10/12 0405 11/09/12 1755 11/09/12 0330 11/08/12 1520 11/08/12 0213  NA 141 136 -- 128* 128* 129* --  K 3.7 4.9 -- -- -- -- --  CL 100 103 -- 94* 92* 91* --  CO2 34*  26 -- 25 26 30  --  GLUCOSE 136* 128* -- 160* 127* 97 --  BUN 31* 29* -- 29* 26* 18 --  CREATININE 0.80 0.69 -- 0.77 0.87 0.91 --  CALCIUM 9.2 9.1 -- 8.2* 8.1* 8.2* --  MG 1.9 2.5 2.5 -- 2.5 -- 1.1*  PHOS 4.5 2.2* 2.7 -- 3.1 -- 3.3    A:  Hypokalemia and mild hypomag P:   - goal K > 4, will replete 11/12/2012 - goal Mag > 2, will replete 11/12/2012   GASTROINTESTINAL  A:  No active issues. P:   - TF.  INFECTIOUS   Lab 11/09/12 0330 11/08/12 0213 11/07/12 2308  PROCALCITON 1.59 0.66 0.51    A:  Suspected HCAP.  PCN allergy. On 11/12/2012  Afebrile and off  pressors  . P:   - Antibiotics as above. But wil dc vanc - PCT 1.59. - Urine Strep/Legionella Ag pending.   NEURO A: At risk for ICU delirium On 11/12/2012: significant agitation when versed weaned (on fent versed gtt) P Add precedex  Continue fent gtt Slowly wean versed gtt off     CRITICAL CARE: The patient is critically ill with multiple organ systems failure and requires high complexity decision making for assessment and support, frequent evaluation and titration of therapies, application of advanced monitoring technologies and extensive interpretation of multiple databases. Critical Care Time devoted to patient care services described in this note is 35 minutes.     Dr. Kalman Shan, M.D., [facility].C.P Pulmonary and Critical Care Medicine Staff Physician [facility] [place] [facility] Pager: [phone], If no answer or between  15:00h - 7:00h: call [phone]  11/12/2012 11:35 AM

## 2012-11-12 NOTE — Progress Notes (Signed)
Physical Therapy Discharge Patient Details Name: [name] [name] MRN: [id number] DOB: 1941/09/27 Today's Date: 11/12/2012 Time: 11:32am   Patient discharged from PT services secondary to medical decline - will need to re-order PT to resume therapy services.  Please see latest therapy progress note for current level of functioning and progress toward goals.    Progress and discharge plan discussed with patient and/or caregiver: Patient unable to participate in discharge planning and no caregivers available  Noted CCM orders to discontinue PT for now. Please re-order when pt medically ready to resume PT services.  GP     Sallyanne Kuster 11/12/2012, 11:32 AM   Sallyanne Kuster, PTA [phone]  Agree with above note.  Rebeca Alert, PT  [phone]

## 2012-11-12 NOTE — Progress Notes (Signed)
Subjective 9 Days Post-Op Procedure(s) (LRB): TOTAL HIP ARTHROPLASTY ANTERIOR APPROACH (Right) Patient reports pain as 0 on 0-10 scale.  Pt intubated and sedated Objective: Vital signs in last 24 hours: Temp:  [97.3 F (36.3 C)-99.2 F (37.3 C)] 98.8 F (37.1 C) (12/14 1300) Pulse Rate:  [67-111] 67  (12/14 1300) Resp:  [7-32] 13  (12/14 1300) BP: (112-171)/(55-102) 112/55 mmHg (12/14 1200) SpO2:  [89 %-99 %] 98 % (12/14 1300) Arterial Line BP: (112-135)/(80-117) 112/98 mmHg (12/13 1600) FiO2 (%):  [30 %-40 %] 40 % (12/14 1214) Weight:  [62.9 kg (138 lb 10.7 oz)] 62.9 kg (138 lb 10.7 oz) (12/14 0000)  Intake/Output from previous day: 12/13 0701 - 12/14 0700 In: 2679.7 [I.V.:445.7; IV Piggyback:1064] Out: 5200 [Urine:5200] Intake/Output this shift: Total I/O In: 738.1 [I.V.:288.1; Other:300; IV Piggyback:150] Out: 2000 [Urine:2000]   Basename 11/12/12 0440 11/11/12 0455 11/10/12 0405  HGB 9.5* 9.1* 9.0*    Basename 11/12/12 0440 11/11/12 0455  WBC 13.0* 15.2*  RBC 3.04* 2.91*  HCT 28.3* 27.5*  PLT 387 315    Basename 11/12/12 0440 11/11/12 0455  NA 141 136  K 3.7 4.9  CL 100 103  CO2 34* 26  BUN 31* 29*  CREATININE 0.80 0.69  GLUCOSE 136* 128*  CALCIUM 9.2 9.1    Basename 11/11/12 0455  LABPT --  INR 1.14    dressing dry, failed attempted weaning today.   Assessment/Plan: 9 Days Post-Op Procedure(s) (LRB): TOTAL HIP ARTHROPLASTY ANTERIOR APPROACH (Right) Plan:   Continue Vent wean attempts.  CXR slowly improving. Enteric tube already re-advanced back to good position.    Had BM yesterday.   [name] [name] C 11/12/2012, 1:14 PM

## 2012-11-12 NOTE — Progress Notes (Signed)
eLink Physician-Brief Progress Note Patient Name: Kiran Lapine DOB: 1940/12/08 MRN: [id number]  Date of Service  11/12/2012   HPI/Events of Note     eICU Interventions  Resume home amlodipin   Intervention Category Intermediate Interventions: Medication change / dose adjustment  ALVA,RAKESH V. 11/12/2012, 8:46 PM

## 2012-11-12 NOTE — Progress Notes (Signed)
eLink Physician-Brief Progress Note Patient Name: Sarissa Dern DOB: 1941/10/07 MRN: [id number]  Date of Service  11/12/2012   HPI/Events of Note   On APRV  P high 15  eICU Interventions  Dropped Phigh 12 - tolerates well changed to pRVC/500/PEEP 8 - chk ABG in an hr , if OK, drop PEEP   Intervention Category Major Interventions: Other:  [place] Capers V. 11/12/2012, 3:57 PM

## 2012-11-13 ENCOUNTER — Inpatient Hospital Stay (HOSPITAL_COMMUNITY): Payer: Medicare Other

## 2012-11-13 LAB — CBC
HCT: 29.1 % — ABNORMAL LOW (ref 36.0–46.0)
MCH: 30.9 pg (ref 26.0–34.0)
MCH: 31.6 pg (ref 26.0–34.0)
MCHC: 34 g/dL (ref 30.0–36.0)
MCV: 92.8 fL (ref 78.0–100.0)
MCV: 93.6 fL (ref 78.0–100.0)
Platelets: 368 10*3/uL (ref 150–400)
Platelets: 368 10*3/uL (ref 150–400)
RDW: 14.7 % (ref 11.5–15.5)

## 2012-11-13 LAB — BASIC METABOLIC PANEL
BUN: 33 mg/dL — ABNORMAL HIGH (ref 6–23)
CO2: 37 mEq/L — ABNORMAL HIGH (ref 19–32)
Calcium: 9 mg/dL (ref 8.4–10.5)
Calcium: 9.3 mg/dL (ref 8.4–10.5)
Chloride: 93 mEq/L — ABNORMAL LOW (ref 96–112)
Creatinine, Ser: 0.69 mg/dL (ref 0.50–1.10)
Creatinine, Ser: 0.8 mg/dL (ref 0.50–1.10)
GFR calc Af Amer: >90 mL/min (ref >90)
GFR calc Af Amer: 85 mL/min — ABNORMAL LOW (ref >90)
GFR calc non Af Amer: 74 mL/min — ABNORMAL LOW (ref >90)

## 2012-11-13 LAB — GLUCOSE, CAPILLARY
Glucose-Capillary: 147 mg/dL — ABNORMAL HIGH (ref 70–99)
Glucose-Capillary: 116 mg/dL — ABNORMAL HIGH (ref 70–99)
Glucose-Capillary: 135 mg/dL — ABNORMAL HIGH (ref 70–99)
Glucose-Capillary: 102 mg/dL — ABNORMAL HIGH (ref 70–99)

## 2012-11-13 LAB — MAGNESIUM: Magnesium: 2.1 mg/dL (ref 1.5–2.5)

## 2012-11-13 MED ORDER — POTASSIUM CHLORIDE CRYS ER 20 MEQ PO TBCR
40.0000 meq | EXTENDED_RELEASE_TABLET | Freq: Three times a day (TID) | ORAL | Status: DC
  Administered 2012-11-13 – 2012-11-14 (×3): 40 meq via ORAL
  Filled 2012-11-13 (×5): qty 2

## 2012-11-13 MED ORDER — ALBUTEROL SULFATE (5 MG/ML) 0.5% IN NEBU
2.5000 mg | INHALATION_SOLUTION | Freq: Four times a day (QID) | RESPIRATORY_TRACT | Status: DC
  Administered 2012-11-13 – 2012-11-15 (×7): 2.5 mg via RESPIRATORY_TRACT
  Filled 2012-11-13 (×8): qty 0.5

## 2012-11-13 MED ORDER — ADULT MULTIVITAMIN W/MINERALS CH
1.0000 | ORAL_TABLET | Freq: Every day | ORAL | Status: DC
  Administered 2012-11-14 – 2012-11-16 (×3): 1 via ORAL
  Filled 2012-11-13 (×3): qty 1

## 2012-11-13 MED ORDER — HALOPERIDOL LACTATE 5 MG/ML IJ SOLN
5.0000 mg | Freq: Four times a day (QID) | INTRAMUSCULAR | Status: DC
  Administered 2012-11-13: 5 mg via INTRAVENOUS
  Filled 2012-11-13 (×5): qty 1

## 2012-11-13 MED ORDER — SODIUM CHLORIDE 0.9 % IV SOLN
INTRAVENOUS | Status: DC
  Administered 2012-11-13 (×2): via INTRAVENOUS

## 2012-11-13 MED ORDER — FUROSEMIDE 10 MG/ML IJ SOLN
40.0000 mg | Freq: Three times a day (TID) | INTRAMUSCULAR | Status: DC
  Administered 2012-11-13 – 2012-11-14 (×4): 40 mg via INTRAVENOUS
  Filled 2012-11-13 (×7): qty 4

## 2012-11-13 MED ORDER — FERROUS SULFATE 325 (65 FE) MG PO TABS
325.0000 mg | ORAL_TABLET | Freq: Three times a day (TID) | ORAL | Status: DC
  Administered 2012-11-13 – 2012-11-16 (×11): 325 mg via ORAL
  Filled 2012-11-13 (×12): qty 1

## 2012-11-13 MED ORDER — FUROSEMIDE 10 MG/ML IJ SOLN
20.0000 mg | Freq: Once | INTRAMUSCULAR | Status: AC
  Administered 2012-11-13: 20 mg via INTRAVENOUS

## 2012-11-13 MED ORDER — BUDESONIDE 0.5 MG/2ML IN SUSP: 0.5000 mg | Freq: Two times a day (BID) | RESPIRATORY_TRACT | Status: DC

## 2012-11-13 MED ORDER — POTASSIUM CHLORIDE 20 MEQ/15ML (10%) PO LIQD: 40.0000 meq | Freq: Three times a day (TID) | ORAL | Status: DC

## 2012-11-13 MED ORDER — DEXMEDETOMIDINE HCL IN NACL 200 MCG/50ML IV SOLN
0.2000 ug/kg/h | INTRAVENOUS | Status: DC
  Filled 2012-11-13: qty 50

## 2012-11-13 MED ORDER — IPRATROPIUM BROMIDE 0.02 % IN SOLN
0.5000 mg | Freq: Four times a day (QID) | RESPIRATORY_TRACT | Status: DC
  Administered 2012-11-13 – 2012-11-15 (×7): 0.5 mg via RESPIRATORY_TRACT
  Filled 2012-11-13 (×7): qty 2.5

## 2012-11-13 MED ORDER — POTASSIUM CHLORIDE 20 MEQ/15ML (10%) PO LIQD
40.0000 meq | Freq: Once | ORAL | Status: AC
  Administered 2012-11-13: 40 meq
  Filled 2012-11-13: qty 30

## 2012-11-13 MED ORDER — PROPOFOL 10 MG/ML IV EMUL
5.0000 ug/kg/min | INTRAVENOUS | Status: DC
  Administered 2012-11-13: 5 ug/kg/min via INTRAVENOUS
  Filled 2012-11-13: qty 100

## 2012-11-13 MED ORDER — PANTOPRAZOLE SODIUM 40 MG PO TBEC
40.0000 mg | DELAYED_RELEASE_TABLET | Freq: Every day | ORAL | Status: DC
  Administered 2012-11-13 – 2012-11-14 (×2): 40 mg via ORAL
  Filled 2012-11-13 (×2): qty 1

## 2012-11-13 NOTE — Progress Notes (Signed)
Pt self extubated at 1000 am. 100% non-rebreather applied. O2 sats 97-100%. HR 90s. Pt able to cough on command and able to state name and place. MD notified- orders for additional lasix given. Will continue to monitor pt,especially respiratory status. Geroge Baseman 11/13/2012

## 2012-11-13 NOTE — Progress Notes (Signed)
Pt increasingly agitated and anxious. RR 55. BP 198/78. HR 70.  Attempted to call Elink MD 5x   0250, 0300, 0310, 0315, 0325, 0330 for improved sedation orders.   New orders for Propofol drip received 0450.   0500. Propofol drip started. Pt calm. VSS. Will continue to monitor.  [name] [name]

## 2012-11-13 NOTE — Progress Notes (Signed)
PULMONARY  / CRITICAL CARE MEDICINE  Name: [name] [name] MRN: [id number] DOB: 1941/07/25    LOS: 5  REFERRING MD :  TRH  CHIEF COMPLAINT:  Acute respiratory distress  BRIEF PATIENT DESCRIPTION: 71 yo smoker without history of lung disease s/p elective total hip arthroplasty on 12/5, developed hypoxia on 12/8.  CT angio was negative for PE but showed bilateral asymmetric ground glass opacities. BNP 1378 - diuresed, but developed progressive hypoxia. PCCM consulted on 12/9, BiPAP started. TTE - nml LVEF, no WMA. Intubated 12-10  LINES / TUBES: 12-10 7.5 ott 23 @teeth >> 11/13/2012 self extubated  12-10 L i j cvl 20 cm>> 12-10 aline>>  CULTURES: 12-10 sputum>>> normal flora Urine 12/10>>>NTD as of 12/15/1 Sputum 12/10>>>normal flora    Lab 11/09/12 0330 11/08/12 0213 11/07/12 2308  PROCALCITON 1.59 0.66 0.51     ANTIBIOTICS: Aztreonam 12/8 >>>11/13/12 Vancomycin 12/8 >>>11/12/12 Levaquin 12/8 >>>     SIGNIFICANT EVENTS:  12/5  Elective total hip arthroplasty. 12/8  Developed hypoxia, abx began, diuresed. 12/9  TTE - LVF wnl.  Progressive hypoxia, NRB, BiPAP. 12-10 intubation 12-11 Changed to APRV ventilation 11/12/12: Meets sBT criteria (currently on APRV) but CXR shows diffuse ALI still and gets very agitated on WUA (neednig high doses of versed gtt)    SUBJECTIVE/OVERNIGHT/INTERVAL HX  11/13/12: Was on PRVC but very agitated with RASS +2to +3 though improved with some ability to folow command. Son and duaghter informed that agitation could take several days to clear up and be patient before extubation. Subsequently extubated after gettnig haldol  VITAL SIGNS: BP 156/63  Pulse 69  Temp 99.7 F (37.6 C) (Axillary)  Resp 29  Ht 5\' 2"  (1.575 m)  Wt 62.2 kg (137 lb 2 oz)  BMI 25.08 kg/m2  SpO2 100%  HEMODYNAMICS:  VENTILATOR SETTINGS: Vent Mode:  [-] PRVC FiO2 (%):  [40 %] 40 % Set Rate:  [15 bmp-18 bmp] 15 bmp Vt Set:  [500 mL] 500 mL PEEP:  [0 cmH20-8  cmH20] 5 cmH20 Pressure Support:  [20 cmH20] 20 cmH20 Plateau Pressure:  [15 cmH20-33 cmH20] 33 cmH20 INTAKE / OUTPUT:       Intake/Output      12/14 0701 - 12/15 0700 12/15 0701 - 12/16 0700   I.V. (mL/kg) 1359.7 (21.9) 103.8 (1.7)   Other 800    NG/GT 200    IV Piggyback 250    Total Intake(mL/kg) 2609.7 (42) 103.8 (1.7)   Urine (mL/kg/hr) 4305 (2.9) 100   Total Output 4305 100   Net -1695.3 +3.8              PHYSICAL EXAMINATION: General:  Looking better but still critically ill Neuro:  RASs +2 to +3. Moving all 4s HEENT:  PERRL Cardiovascular:  Irregular rhythm, no murmurs Lungs:  Bilateral diminished air entry, diffuse rales Abdomen:  Soft, nontender Musculoskeletal:  Moves all extremities, no edema Skin:  Intact  LABS:  Lab 11/13/12 0430 11/12/12 0440 11/11/12 0455  NA 139 141 136  K 3.4* 3.7 4.9  CL 95* 100 103  CO2 37* 34* 26  BUN 33* 31* 29*  CREATININE 0.69 0.80 0.69  GLUCOSE 133* 136* 128*    Lab 11/13/12 0430 11/13/12 0005 11/12/12 0440  HGB 9.6* 9.6* 9.5*  HCT 29.1* 28.2* 28.3*  WBC 10.9* 9.8 13.0*  PLT 368 368 387   ABG    Component Value Date/Time   PHART 7.473* 11/12/2012 1830   PCO2ART 53.4* 11/12/2012 1830   PO2ART 144.0* 11/12/2012  1830   HCO3 38.6* 11/12/2012 1830   TCO2 35.3 11/12/2012 1830   ACIDBASEDEF 6.4* 11/11/2012 1752   O2SAT 99.5 11/12/2012 1830   IMAGING: Dg Chest Port 1 View  11/13/2012  *RADIOLOGY REPORT*  Clinical Data: Check endotracheal tube  PORTABLE CHEST - 1 VIEW  Comparison: Chest radiograph 11/02/2012  Findings: Endotracheal tube, central venous line, and NG tube are unchanged.  Stable heart silhouette.  There is diffuse fine air space disease not changed from prior.  No pneumothorax. Low lung volumes.  IMPRESSION: 1.  Stable support apparatus. 2.  No interval change. 3.  Diffuse fine air space disease.   Original Report Authenticated By: Genevive Bi, M.D.    Dg Chest Port 1 View  11/12/2012  *RADIOLOGY  REPORT*  Clinical Data: ET tube position  PORTABLE CHEST - 1 VIEW  Comparison: 11/11/2012  Findings: Endotracheal tube terminates 4 cm above the carina.  Stable interstitial/airspace opacities bilaterally, favoring interstitial edema, less likely multifocal infection. No pleural effusion or pneumothorax.  Stable mild cardiomegaly.  Left IJ venous catheter with the tip in the mid SVC.  Enteric tube has been withdrawn and now terminates in the mid/upper esophagus.  IMPRESSION: Endotracheal tube terminates 4 cm above the carina.  Stable interstitial/airspace opacities bilaterally, favoring interstitial edema, less likely multifocal infection.  Enteric tube has been withdrawn and now terminates in the mid/upper esophagus.  Advancement is suggested.   Original Report Authenticated By: Charline Bills, M.D.    ECG: 12/8 >>> LVH, NSR 2D: The cavity size was normal. Wall thickness was increased in a pattern of mild LVH. Systolic function was vigorous. The estimated ejection fraction was in the range of 65% to 70%.  DIAGNOSES: Principal Problem:  *Hypoxemia Active Problems:  Degenerative arthritis of hip  Confusion  Fever  S/P total hip arthroplasty  CHF (congestive heart failure)  HCAP (healthcare-associated pneumonia)  Acute respiratory failure  Normocytic anemia  Hypokalemia  Acute diastolic heart failure  ASSESSMENT / PLAN:  PULMONARY  A: Acute respiratory failure, hypoxemic (DDx:  ARDS, Pneumonitis, HCAP, Pulmonary edema, NSIP, RB-ILD, HP). On 11/13/2012: CXR still congested without change from 11/12/12 but marked imrpovenment form 11/12/12. Self extubated P:   - Monitor off ventilator  - Use bipap if needed - awiat autiommne from 11/08/12  - await cultures - Steroids. -- Aggressive diureses.  CARDIOVASCULAR  Lab 11/13/12 0430 11/08/12 0213 11/07/12 0434  PROBNP 2486.0* 1066.0* 661.6*    A: Acute diastolic CHF. Shock resolved On 11/13/2012: not on pressors but BNP and CXR  suggests diastolic chf (acute)   P:  - Aggressive diureses with lasix scheduled IV pushes - Monitor    RENAL  Lab 11/13/12 0430 11/12/12 0440 11/11/12 0455 11/10/12 0405 11/09/12 1755 11/09/12 0330  NA 139 141 136 -- 128* 128*  K 3.4* 3.7 -- -- -- --  CL 95* 100 103 -- 94* 92*  CO2 37* 34* 26 -- 25 26  GLUCOSE 133* 136* 128* -- 160* 127*  BUN 33* 31* 29* -- 29* 26*  CREATININE 0.69 0.80 0.69 -- 0.77 0.87  CALCIUM 9.0 9.2 9.1 -- 8.2* 8.1*  MG 2.1 1.9 2.5 2.5 -- 2.5  PHOS 2.5 4.5 2.2* 2.7 -- 3.1    A:  Hypokalemia  P:   - goal K > 4, l replete 11/13/2012 - goal Mag > 2,    GASTROINTESTINAL  A:  No active issues. P:   - stop tube feeds  INFECTIOUS   Lab 11/09/12 0330 11/08/12 0213 11/07/12  2308  PROCALCITON 1.59 0.66 0.51    A:  Suspected HCAP.  PCN allergy. On 11/13/2012  Afebrile and off pressors. Culture negative so far  . P:   - Antibiotics as above - dc vanc and azactam. Continue levaquin (total 8 days) - await urine leg.   NEURO A: At risk for ICU delirium On 11/13/2012: significant agitation despite precedex trial and diprivan trial. Self extubated P Dc sedation gtt Use scheduled haldol Use precedex if needed    Son and duaghter updated at bedside.   CRITICAL CARE: The patient is critically ill with multiple organ systems failure and requires high complexity decision making for assessment and support, frequent evaluation and titration of therapies, application of advanced monitoring technologies and extensive interpretation of multiple databases. Critical Care Time devoted to patient care services described in this note is 35 minutes.     Dr. Kalman Shan, M.D., [facility].C.P Pulmonary and Critical Care Medicine Staff Physician [facility] [place] [facility] Pager: [phone], If no answer or between  15:00h - 7:00h: call [phone]  11/13/2012 10:02 AM

## 2012-11-13 NOTE — Progress Notes (Signed)
She extubated herself this morning.  We placed her on a non rebreather, her sats were 100 respiratory rate was 18.  At first she was a little sluggish but following commands.  Rn turned propofol completely off, shortly their after she was fully alert.  I will continue to monitor her throughout the day today.

## 2012-11-13 NOTE — Progress Notes (Signed)
Agitation while on Fentanyl and Precedex; Bradycardic in 50s on Precedex. Nursing staff started Versed.   Plan: stop Precedex, titrate versed down and initiate Propofol.

## 2012-11-13 NOTE — Progress Notes (Signed)
ANTIBIOTIC CONSULT NOTE - FOLLOW UP  Pharmacy Consult for Levaquin Indication: PNA  Allergies  Allergen Reactions  . Penicillins Hives and Swelling   Labs:  Basename 11/13/12 0430 11/13/12 0005 11/12/12 0440 11/11/12 0455  WBC 10.9* 9.8 13.0* --  HGB 9.6* 9.6* 9.5* --  PLT 368 368 387 --  LABCREA -- -- -- --  CREATININE 0.69 -- 0.80 0.69   Estimated Creatinine Clearance: 57.5 ml/min (by C-G formula based on Cr of 0.69).  Assessment: 71 yo F on Day #8 Levaquin 750mg  IV q24h for presumed HAP s/p surgery. Plan to complete 8 days Levaquin noted in CCM note. After today's dose, patient will have completed 8 days of therapy.  Plan:  Suggest d/c'ing Levaquin after today's dose (completed 8 days)  Darrol Angel, PharmD Pager: [phone] 11/13/2012,11:20 AM

## 2012-11-14 ENCOUNTER — Inpatient Hospital Stay (HOSPITAL_COMMUNITY): Payer: Medicare Other

## 2012-11-14 LAB — GLUCOSE, CAPILLARY: Glucose-Capillary: 144 mg/dL — ABNORMAL HIGH (ref 70–99)

## 2012-11-14 LAB — BASIC METABOLIC PANEL
Chloride: 95 mEq/L — ABNORMAL LOW (ref 96–112)
GFR calc Af Amer: >90 mL/min (ref >90)
GFR calc non Af Amer: 84 mL/min — ABNORMAL LOW (ref >90)
Potassium: 3.6 mEq/L (ref 3.5–5.1)
Sodium: 139 mEq/L (ref 135–145)

## 2012-11-14 LAB — CBC
HCT: 30.2 % — ABNORMAL LOW (ref 36.0–46.0)
Platelets: 444 10*3/uL — ABNORMAL HIGH (ref 150–400)
RDW: 14.7 % (ref 11.5–15.5)
WBC: 17.6 10*3/uL — ABNORMAL HIGH (ref 4.0–10.5)

## 2012-11-14 MED ORDER — ACETAMINOPHEN-CODEINE #3 300-30 MG PO TABS
1.0000 | ORAL_TABLET | Freq: Four times a day (QID) | ORAL | Status: DC | PRN
  Administered 2012-11-14 – 2012-11-15 (×2): 1 via ORAL
  Filled 2012-11-14: qty 1

## 2012-11-14 MED ORDER — CLOTRIMAZOLE 10 MG MT TROC
10.0000 mg | Freq: Every day | OROMUCOSAL | Status: DC
  Administered 2012-11-14 – 2012-11-16 (×9): 10 mg via ORAL
  Filled 2012-11-14 (×14): qty 1

## 2012-11-14 MED ORDER — ACETAMINOPHEN-CODEINE #3 300-30 MG PO TABS
1.0000 | ORAL_TABLET | ORAL | Status: DC | PRN
  Filled 2012-11-14: qty 1

## 2012-11-14 MED ORDER — FUROSEMIDE 10 MG/ML IJ SOLN
40.0000 mg | Freq: Every day | INTRAMUSCULAR | Status: DC
  Filled 2012-11-14: qty 4

## 2012-11-14 MED ORDER — METHYLPREDNISOLONE SODIUM SUCC 125 MG IJ SOLR
60.0000 mg | Freq: Two times a day (BID) | INTRAMUSCULAR | Status: DC
  Filled 2012-11-14 (×2): qty 0.96

## 2012-11-14 NOTE — Progress Notes (Signed)
[id number]/Sahas Sluka, RN, BSN, CCM: CHART REVIEWED AND UPDATED.  Next chart review due on [id number]. NO DISCHARGE NEEDS PRESENT AT THIS TIME. CASE MANAGEMENT [phone] 

## 2012-11-14 NOTE — Evaluation (Signed)
Physical Therapy Evaluation Patient Details Name: [name] [name] MRN: [id number] DOB: 12-Nov-1941 Today's Date: 11/14/2012 Time: 1308-6578 PT Time Calculation (min): 23 min  PT Assessment / Plan / Recommendation Clinical Impression  Pt. is evaluated today after pt. self extubated self on 12/15. Pt. had VDRF after R THA  opn 12/5. Pt. did stand and pivot x 2 w/ 2 person assist. Pt. will benefit from post acute rehab. Pt. will benefit from PT while in acute care.    PT Assessment  Patient needs continued PT services    Follow Up Recommendations  SNF    Does the patient have the potential to tolerate intense rehabilitation      Barriers to Discharge Decreased caregiver support      Equipment Recommendations  Rolling walker with 5" wheels (youth)    Recommendations for Other Services     Frequency 7X/week    Precautions / Restrictions Precautions Precaution Comments: monitor VS. Restrictions Weight Bearing Restrictions: No RLE Weight Bearing: Weight bearing as tolerated   Pertinent Vitals/Pain Pt. On 2.5  l  sats . Dropped to 88% with mobility, back to 94%.       Mobility  Bed Mobility Bed Mobility: Sitting - Scoot to Edge of Bed Supine to Sit: 4: Min assist Sitting - Scoot to Edge of Bed: 4: Min assist Details for Bed Mobility Assistance: extra time taken for mobility. Transfers Transfers: Stand Pivot Transfers Sit to Stand: 1: +2 Total assist;From bed;With armrests;With upper extremity assist;From chair/3-in-1 Sit to Stand: Patient Percentage: 60% Stand to Sit: With armrests;To bed;To chair/3-in-1 Stand Pivot Transfers: 1: +2 Total assist Stand Pivot Transfers: Patient Percentage: 60% Details for Transfer Assistance: cues for UE use to push off/reachh back. pt. takes extra time for mobility. Has been in bed for about 1 week.Pt. used RW to pivot to [facility] then to pivot step to recliner .  Knees ar week and flexed in stance Ambulation/Gait Ambulation/Gait Assistance:  Not tested (comment)    Shoulder Instructions     Exercises     PT Diagnosis: Difficulty walking;Generalized weakness  PT Problem List: Decreased strength;Decreased range of motion;Decreased activity tolerance;Decreased balance;Decreased mobility;Decreased cognition;Decreased knowledge of use of DME;Decreased safety awareness;Decreased knowledge of precautions PT Treatment Interventions: DME instruction;Gait training;Functional mobility training;Therapeutic activities;Therapeutic exercise;Patient/family education   PT Goals Acute Rehab PT Goals PT Goal Formulation: With patient Time For Goal Achievement: 11/28/12 Potential to Achieve Goals: Good Pt will go Supine/Side to Sit: with supervision PT Goal: Supine/Side to Sit - Progress: Goal set today Pt will go Sit to Supine/Side: with supervision PT Goal: Sit to Supine/Side - Progress: Updated due to goal met Pt will go Sit to Stand: with supervision PT Goal: Sit to Stand - Progress: Goal set today Pt will Ambulate: 51 - 150 feet;with supervision;with rolling walker PT Goal: Ambulate - Progress: Goal set today Pt will Perform Home Exercise Program: with min assist PT Goal: Perform Home Exercise Program - Progress: Goal set today  Visit Information  Last PT Received On: 11/14/12 Assistance Needed: +2    Subjective Data  Subjective: I don't know what happened. Patient Stated Goal: to go home   Prior Functioning  Home Living Lives With: Alone Available Help at Discharge: Family Type of Home: House Home Access: Stairs to enter Entrance Stairs-Rails: None Home Layout: Two level;1/2 bath on main level Alternate Level Stairs-Number of Steps: 8+8 Alternate Level Stairs-Rails: None Bathroom Shower/Tub: Health visitor: Standard Home Adaptive Equipment: Walker - rolling;Raised toilet seat with rails Prior  Function Level of Independence: Independent Able to Take Stairs?: Yes Driving:  Yes Communication Communication: No difficulties Dominant Hand: Right    Cognition  Overall Cognitive Status: Impaired Arousal/Alertness: Awake/alert Orientation Level: Time;Situation Behavior During Session: [facility] for tasks performed    Extremity/Trunk Assessment Right Lower Extremity Assessment RLE ROM/Strength/Tone: Deficits RLE ROM/Strength/Tone Deficits: hip flexion 3+, knee ext. 4 RLE Sensation: WFL - Light Touch Left Lower Extremity Assessment LLE ROM/Strength/Tone: Deficits LLE ROM/Strength/Tone Deficits: hip flex 4+ , knee ext 4 LLE Sensation: WFL - Light Touch Trunk Assessment Trunk Assessment: Normal   Balance Static Sitting Balance Static Sitting - Balance Support: Bilateral upper extremity supported;Feet supported Static Sitting - Level of Assistance: 5: Stand by assistance  End of Session PT - End of Session Equipment Utilized During Treatment: Oxygen Activity Tolerance: Patient limited by fatigue Patient left: in chair;with call bell/phone within reach Nurse Communication: Mobility status  GP     Rada Hay 11/14/2012, 10:24 AM  [phone]

## 2012-11-14 NOTE — Evaluation (Signed)
Occupational Therapy Evaluation Patient Details Name: [name] [name] MRN: [id number] DOB: [birth year] Today's Date: 11/14/2012 Time: 4098-1191 OT Time Calculation (min): 38 min  OT Assessment / Plan / Recommendation Clinical Impression  Pt s/p R THA anterior approach. 3 days post-op, pt was brought to ICU for VDRF. Pt self extubated on 12/15. Skilled OT indicated to maximize independence with BADLs to setup/min A level in prep for d/c to next venue of care.    OT Assessment  Patient needs continued OT Services    Follow Up Recommendations  SNF    Barriers to Discharge Decreased caregiver support    Equipment Recommendations  3 in 1 bedside comode    Recommendations for Other Services    Frequency  Min 2X/week    Precautions / Restrictions Precautions Precaution Comments: monitor VS. Restrictions Weight Bearing Restrictions: No RLE Weight Bearing: Weight bearing as tolerated   Pertinent Vitals/Pain Pt denied pain. Vitals stable on 2L of O2.   ADL  Grooming: Set up Where Assessed - Grooming: Unsupported sitting Upper Body Bathing: Minimal assistance Where Assessed - Upper Body Bathing: Unsupported sitting Lower Body Bathing: Maximal assistance Where Assessed - Lower Body Bathing: Supported sit to stand Upper Body Dressing: Minimal assistance Where Assessed - Upper Body Dressing: Unsupported sitting Lower Body Dressing: Maximal assistance Where Assessed - Lower Body Dressing: Supported sit to Pharmacist, hospital Method: Surveyor, minerals: Materials engineer and Hygiene: Maximal assistance Where Assessed - Engineer, mining and Hygiene: Sit to stand from 3-in-1 or toilet Equipment Used: Rolling walker ADL Comments: Pt has hip kit and will need education on how to use each piece. Pt tolerated session well.    OT Diagnosis: Generalized weakness  OT Problem List: Decreased activity tolerance;Decreased  safety awareness;Decreased knowledge of use of DME or AE OT Treatment Interventions: Self-care/ADL training;Therapeutic activities;DME and/or AE instruction;Patient/family education   OT Goals Acute Rehab OT Goals OT Goal Formulation: With patient Time For Goal Achievement: 11/14/12 Potential to Achieve Goals: Good ADL Goals Pt Will Perform Grooming: with supervision;Standing at sink ADL Goal: Grooming - Progress: Goal set today Pt Will Perform Upper Body Bathing: with set-up;Sitting, chair;Sitting, edge of bed;Unsupported ADL Goal: Upper Body Bathing - Progress: Goal set today Pt Will Perform Lower Body Bathing: with min assist;Sit to stand from chair;Sit to stand from bed ADL Goal: Lower Body Bathing - Progress: Goal set today Pt Will Perform Upper Body Dressing: with set-up;Sitting, bed;Sitting, chair;Unsupported ADL Goal: Upper Body Dressing - Progress: Goal set today Pt Will Perform Lower Body Dressing: with min assist;Sit to stand from bed;Sit to stand from chair ADL Goal: Lower Body Dressing - Progress: Goal set today Pt Will Transfer to Toilet: with supervision;Ambulation;with DME;Stand pivot transfer ADL Goal: Toilet Transfer - Progress: Goal set today Pt Will Perform Toileting - Clothing Manipulation: with supervision;Sitting on 3-in-1 or toilet;Standing ADL Goal: Toileting - Clothing Manipulation - Progress: Goal set today Pt Will Perform Toileting - Hygiene: with supervision;Sit to stand from 3-in-1/toilet ADL Goal: Toileting - Hygiene - Progress: Goal set today  Visit Information  Last OT Received On: 11/14/12 Assistance Needed: +2 PT/OT Co-Evaluation/Treatment: Yes    Subjective Data  Subjective: I can't believe Ive been here for 2 weeks. Patient Stated Goal: Likely go to [facility] for rehab.   Prior Functioning     Home Living Lives With: Alone Available Help at Discharge: Family Type of Home: House Home Access: Stairs to enter Entrance Stairs-Rails:  None Home Layout:  Two level;1/2 bath on main level Alternate Level Stairs-Number of Steps: 8+8 Alternate Level Stairs-Rails: None Bathroom Shower/Tub: Health visitor: Standard Home Adaptive Equipment: Environmental consultant - rolling;Raised toilet seat with rails Prior Function Level of Independence: Independent Able to Take Stairs?: Yes Driving: Yes Communication Communication: No difficulties Dominant Hand: Right         Vision/Perception     Cognition  Overall Cognitive Status: Impaired Arousal/Alertness: Awake/alert Orientation Level: Time;Situation Behavior During Session: WFL for tasks performed    Extremity/Trunk Assessment Right Upper Extremity Assessment RUE ROM/Strength/Tone: [facility] for tasks assessed Left Upper Extremity Assessment LUE ROM/Strength/Tone: WFL for tasks assessed Right Lower Extremity Assessment RLE ROM/Strength/Tone: Deficits RLE ROM/Strength/Tone Deficits: hip flexion 3+, knee ext. 4 RLE Sensation: WFL - Light Touch Left Lower Extremity Assessment LLE ROM/Strength/Tone: Deficits LLE ROM/Strength/Tone Deficits: hip flex 4+ , knee ext 4 LLE Sensation: WFL - Light Touch Trunk Assessment Trunk Assessment: Normal     Mobility Bed Mobility Bed Mobility: Sitting - Scoot to Edge of Bed Supine to Sit: 4: Min assist Sitting - Scoot to [place] of Bed: 4: Min assist Details for Bed Mobility Assistance: extra time taken for mobility. Transfers Sit to Stand: 1: +2 Total assist;From bed;With armrests;With upper extremity assist;From chair/3-in-1 Sit to Stand: Patient Percentage: 60% Stand to Sit: With armrests;To bed;To chair/3-in-1 Details for Transfer Assistance: cues for UE use to push off/reach back. pt. takes extra time for mobility. Has been in bed for about 1 week.Pt. used RW to pivot to [facility] then to pivot step to recliner .  Knees are week and flexed in stance     Shoulder Instructions     Exercise     Balance Static Sitting Balance Static  Sitting - Balance Support: Bilateral upper extremity supported;Feet supported Static Sitting - Level of Assistance: 5: Stand by assistance   End of Session OT - End of Session Activity Tolerance: Patient tolerated treatment well Patient left: in chair;with call bell/phone within reach  GO     Daritza Brees A OTR/L [phone] 11/14/2012, 10:50 AM

## 2012-11-14 NOTE — Progress Notes (Signed)
PULMONARY  / CRITICAL CARE MEDICINE  Name: [name] [name] MRN: [id number] DOB: 1941-10-27    LOS: 5  REFERRING MD :  TRH  CHIEF COMPLAINT:  Acute respiratory distress  BRIEF PATIENT DESCRIPTION: 71 yo smoker without history of lung disease s/p elective total hip arthroplasty on 12/5, developed hypoxia on 12/8.  CT angio was negative for PE but showed bilateral asymmetric ground glass opacities. BNP 1378 - diuresed, but developed progressive hypoxia. PCCM consulted on 12/9, BiPAP started. TTE - nml LVEF, no WMA. Intubated 12-10  LINES / TUBES: 12-10 7.5 ott 23 @teeth >> 11/13/2012 self extubated  12-10 L i j cvl 20 cm>>12-16 12-10 aline>>out  CULTURES: 12-10 sputum>>> normal flora Urine 12/10>>>NTD as of 12/15/1 Sputum 12/10>>>normal flora    Lab 11/09/12 0330 11/08/12 0213 11/07/12 2308  PROCALCITON 1.59 0.66 0.51     ANTIBIOTICS: Aztreonam 12/8 >>>11/13/12 Vancomycin 12/8 >>>11/12/12 Levaquin 12/8 >>>     SIGNIFICANT EVENTS:  12/5  Elective total hip arthroplasty. 12/8  Developed hypoxia, abx began, diuresed. 12/9  TTE - LVF wnl.  Progressive hypoxia, NRB, BiPAP. 12-10 intubation 12-11 Changed to APRV ventilation 11/12/12: Meets sBT criteria (currently on APRV) but CXR shows diffuse ALI still and gets very agitated on WUA (neednig high doses of versed gtt) 12-15 self extubated   SUBJECTIVE/OVERNIGHT/INTERVAL HX  11/14/12: Awake and in no distress.  VITAL SIGNS: BP 149/56  Pulse 85  Temp 99.3 F (37.4 C) (Core (Comment))  Resp 21  Ht 5\' 2"  (1.575 m)  Wt 62.2 kg (137 lb 2 oz)  BMI 25.08 kg/m2  SpO2 98%  HEMODYNAMICS:  VENTILATOR SETTINGS: Vent Mode:  [-]  FiO2 (%):  [3 %-100 %] 3 % INTAKE / OUTPUT:       Intake/Output      12/15 0701 - 12/16 0700 12/16 0701 - 12/17 0700   I.V. (mL/kg) 673.8 (10.8)    Other     NG/GT     IV Piggyback 166    Total Intake(mL/kg) 839.8 (13.5)    Urine (mL/kg/hr) 5545 (3.7) 400   Total Output 5545 400   Net  -4705.2 -400        Stool Occurrence 1 x        Filed Weights   11/10/12 0600 11/12/12 0000 11/13/12 0431  Weight: 63.9 kg (140 lb 14 oz) 62.9 kg (138 lb 10.7 oz) 62.2 kg (137 lb 2 oz)     PHYSICAL EXAMINATION: General: Awake and interactive off vent, still weak Neuro:  RASs +2 to +3. Moving all 4s. Appears somewhat stunned. HEENT:  PERRL Cardiovascular:  Irregular rhythm, no murmurs Lungs:  Bilateral diminished air entry,  rales in bases Abdomen:  Soft, nontender Musculoskeletal:  Moves all extremities, no edema Skin:  Intact  LABS:  Lab 11/14/12 0815 11/13/12 1800 11/13/12 0430  NA 139 137 139  K 3.6 3.5 3.4*  CL 95* 93* 95*  CO2 35* 36* 37*  BUN 29* 29* 33*  CREATININE 0.77 0.80 0.69  GLUCOSE 133* 123* 133*    Lab 11/14/12 0500 11/13/12 0430 11/13/12 0005  HGB 10.2* 9.6* 9.6*  HCT 30.2* 29.1* 28.2*  WBC 17.6* 10.9* 9.8  PLT 444* 368 368   ABG    Component Value Date/Time   PHART 7.473* 11/12/2012 1830   PCO2ART 53.4* 11/12/2012 1830   PO2ART 144.0* 11/12/2012 1830   HCO3 38.6* 11/12/2012 1830   TCO2 35.3 11/12/2012 1830   ACIDBASEDEF 6.4* 11/11/2012 1752   O2SAT 99.5 11/12/2012 1830  IMAGING: Dg Chest Port 1 View  11/13/2012  *RADIOLOGY REPORT*  Clinical Data: Check endotracheal tube  PORTABLE CHEST - 1 VIEW  Comparison: Chest radiograph 11/02/2012  Findings: Endotracheal tube, central venous line, and NG tube are unchanged.  Stable heart silhouette.  There is diffuse fine air space disease not changed from prior.  No pneumothorax. Low lung volumes.  IMPRESSION: 1.  Stable support apparatus. 2.  No interval change. 3.  Diffuse fine air space disease.   Original Report Authenticated By: Genevive Bi, M.D.    ECG: 12/8 >>> LVH, NSR 2D: The cavity size was normal. Wall thickness was increased in a pattern of mild LVH. Systolic function was vigorous. The estimated ejection fraction was in the range of 65% to 70%.  DIAGNOSES: Principal Problem:   *Hypoxemia Active Problems:  Degenerative arthritis of hip  Confusion  Fever  S/P total hip arthroplasty  CHF (congestive heart failure)  HCAP (healthcare-associated pneumonia)  Acute respiratory failure  Normocytic anemia  Hypokalemia  Acute diastolic heart failure  ASSESSMENT / PLAN:  PULMONARY  A: Acute respiratory failure, hypoxemic (DDx:  ARDS, Pneumonitis, HCAP, Pulmonary edema, NSIP, RB-ILD, HP).  Self extubated P:   - Monitor off ventilator - change to po pred- rapid taper over 1 week   CARDIOVASCULAR  Lab 11/13/12 0430 11/08/12 0213  PROBNP 2486.0* 1066.0*    A: Shock resolved Echo - LVH, nml LVEF  Intake/Output Summary (Last 24 hours) at 11/14/12 0918 Last data filed at 11/14/12 0800  Gross per 24 hour  Intake    736 ml  Output   5845 ml  Net  -5109 ml    P:  -  lasix  Changed from q 8 h to Qd 12-16 as to not over diuresis.  - Monitor lytes    RENAL  Lab 11/14/12 0815 11/13/12 1800 11/13/12 0430 11/12/12 0440 11/11/12 0455 11/10/12 0405 11/09/12 0330  NA 139 137 139 141 136 -- --  K 3.6 3.5 -- -- -- -- --  CL 95* 93* 95* 100 103 -- --  CO2 35* 36* 37* 34* 26 -- --  GLUCOSE 133* 123* 133* 136* 128* -- --  BUN 29* 29* 33* 31* 29* -- --  CREATININE 0.77 0.80 0.69 0.80 0.69 -- --  CALCIUM 9.6 9.3 9.0 9.2 9.1 -- --  MG -- -- 2.1 1.9 2.5 2.5 2.5  PHOS -- -- 2.5 4.5 2.2* 2.7 3.1    A:  Hypokalemia   Lab 11/14/12 0815 11/13/12 1800 11/13/12 0430  K 3.6 3.5 3.4*     P:   -replete as needed    GASTROINTESTINAL  A:  No active issues. P:   - eating  INFECTIOUS   Lab 11/09/12 0330 11/08/12 0213 11/07/12 2308  PROCALCITON 1.59 0.66 0.51    A:  Suspected HCAP.  PCN allergy. On 11/14/2012  Afebrile and off pressors. Culture negative so far 12-16  . P:   - Antibiotics as above  - Continue levaquin (total 8 days)    NEURO A: At risk for ICU delirium  P Dc sedation gtt Dc haldol Thiamine and folic acid    Son and daughter  extensively  updated at bedside. 12-16 will change to SDU status, decrease diuretic to daily(near dry weight), dc cvl and if continues to improve will move to floor and to [facility] service 12-17.   Brett Canales Minor ACNP Adolph Pollack PCCM Pager [phone] till 3 pm If no answer page [phone]  [facility]  11/14/2012, 9:14  AM        

## 2012-11-14 NOTE — Progress Notes (Signed)
Patient ID: [name] [name], female   DOB: August 20, 1941, 71 y.o.   MRN: [id number] Awake and alert.  Following commands appropriately.  Right hip stable.  On nasal cannula O2. Will re-order PT/OT today.

## 2012-11-15 ENCOUNTER — Inpatient Hospital Stay (HOSPITAL_COMMUNITY): Payer: Medicare Other

## 2012-11-15 LAB — CULTURE, BLOOD (ROUTINE X 2): Culture: NO GROWTH

## 2012-11-15 LAB — CBC
MCH: 30.9 pg (ref 26.0–34.0)
MCV: 93.3 fL (ref 78.0–100.0)
Platelets: 420 10*3/uL — ABNORMAL HIGH (ref 150–400)
RDW: 14.9 % (ref 11.5–15.5)

## 2012-11-15 LAB — GLUCOSE, CAPILLARY
Glucose-Capillary: 95 mg/dL (ref 70–99)
Glucose-Capillary: 119 mg/dL — ABNORMAL HIGH (ref 70–99)

## 2012-11-15 LAB — BASIC METABOLIC PANEL
Calcium: 9.2 mg/dL (ref 8.4–10.5)
Creatinine, Ser: 0.7 mg/dL (ref 0.50–1.10)
GFR calc Af Amer: >90 mL/min (ref >90)

## 2012-11-15 LAB — PHOSPHORUS: Phosphorus: 3.1 mg/dL (ref 2.3–4.6)

## 2012-11-15 MED ORDER — FUROSEMIDE 20 MG PO TABS
20.0000 mg | ORAL_TABLET | Freq: Every day | ORAL | Status: DC
  Administered 2012-11-15 – 2012-11-16 (×2): 20 mg via ORAL
  Filled 2012-11-15 (×2): qty 1

## 2012-11-15 MED ORDER — PREDNISONE 10 MG PO TABS
10.0000 mg | ORAL_TABLET | Freq: Once | ORAL | Status: DC
  Filled 2012-11-15 (×2): qty 1

## 2012-11-15 MED ORDER — PREDNISONE 10 MG PO TABS
30.0000 mg | ORAL_TABLET | Freq: Once | ORAL | Status: AC
  Administered 2012-11-15: 30 mg via ORAL
  Filled 2012-11-15: qty 1

## 2012-11-15 MED ORDER — POTASSIUM CHLORIDE CRYS ER 20 MEQ PO TBCR
20.0000 meq | EXTENDED_RELEASE_TABLET | Freq: Every day | ORAL | Status: DC
  Administered 2012-11-15: 20 meq via ORAL
  Filled 2012-11-15: qty 1

## 2012-11-15 MED ORDER — PREDNISONE 20 MG PO TABS
20.0000 mg | ORAL_TABLET | Freq: Once | ORAL | Status: AC
  Administered 2012-11-16: 20 mg via ORAL
  Filled 2012-11-15: qty 1

## 2012-11-15 MED ORDER — ALBUTEROL SULFATE (5 MG/ML) 0.5% IN NEBU
2.5000 mg | INHALATION_SOLUTION | Freq: Four times a day (QID) | RESPIRATORY_TRACT | Status: DC | PRN
  Administered 2012-11-15 – 2012-11-16 (×3): 2.5 mg via RESPIRATORY_TRACT
  Filled 2012-11-15 (×3): qty 0.5

## 2012-11-15 MED ORDER — IPRATROPIUM BROMIDE 0.02 % IN SOLN
0.5000 mg | Freq: Four times a day (QID) | RESPIRATORY_TRACT | Status: DC
  Administered 2012-11-15 – 2012-11-16 (×5): 0.5 mg via RESPIRATORY_TRACT
  Filled 2012-11-15 (×5): qty 2.5

## 2012-11-15 NOTE — Progress Notes (Signed)
Patient ID: [name] [name], female   DOB: [birth year], 71 y.o.   MRN: [id number] Slowly making progress.  Awake and alert.  Less labored breathing.  Can likely transition to a floor bed today.  Will likely d/c foley today as well.  Continue with PT/OT.  WBC up to 18?

## 2012-11-15 NOTE — Progress Notes (Signed)
NUTRITION FOLLOW UP  Intervention:   - Diet advancement per MD - Encouraged increased meal intake - Will monitor   Nutrition Dx:   Inadequate oral intake r/t mechanical ventilation AEB intubation, NPO - no longer appropriate, diet advanced.  New Nutrition Dx: Increased nutrient needs r/t total hip arthroplasty on 11/03/12 AEB MD notes.   Goal:   Enteral nutrition, initiation with tolerance - no longer appropriate, pt no longer on TF.   New Goal: Pt to consume >90% of meals.   Monitor:   Weights, labs, intake  Assessment:   Pt extubated herself 12/15. Pt on full liquid diet - pt reports she is tolerating. Intake has been 50% of meals. Pt declined wanting any nutritional supplements at this time.   Height: Ht Readings from Last 1 Encounters:  11/07/12 5\' 2"  (1.575 m)    Weight Status:   Wt Readings from Last 1 Encounters:  11/15/12 124 lb 12.5 oz (56.6 kg)    Re-estimated needs:  Kcal: 1400-1700 Protein: 55-70g Fluid: 1.4-1.7L   Diet Order: Full Liquid   Intake/Output Summary (Last 24 hours) at 11/15/12 1518 Last data filed at 11/15/12 1337  Gross per 24 hour  Intake    560 ml  Output   1310 ml  Net   -750 ml    Last BM: 12/15   Labs:   Lab 11/15/12 0345 11/14/12 0815 11/13/12 1800 11/13/12 0430 11/12/12 0440  NA 139 139 137 -- --  K 3.9 3.6 3.5 -- --  CL 101 95* 93* -- --  CO2 30 35* 36* -- --  BUN 24* 29* 29* -- --  CREATININE 0.70 0.77 0.80 -- --  CALCIUM 9.2 9.6 9.3 -- --  MG 1.9 -- -- 2.1 1.9  PHOS 3.1 -- -- 2.5 4.5  GLUCOSE 108* 133* 123* -- --    CBG (last 3)   Basename 11/15/12 1150 11/15/12 0809 11/15/12 0341  GLUCAP 157* 95 120*    Scheduled Meds:   . amLODipine  5 mg Oral Daily  . antiseptic oral rinse  15 mL Mouth Rinse QID  . chlorhexidine  15 mL Mouth Rinse BID  . clotrimazole  10 mg Oral 5 X Daily  . docusate  100 mg Oral BID  . enoxaparin (LOVENOX) injection  30 mg Subcutaneous Q12H  . ferrous sulfate  325 mg Oral TID WC   . folic acid  1 mg Oral Daily  . furosemide  20 mg Oral Daily  . insulin aspart  2-6 Units Subcutaneous Q4H  . ipratropium  0.5 mg Nebulization Q6H  . multivitamin with minerals  1 tablet Oral Daily  . predniSONE  10 mg Oral Once  . predniSONE  20 mg Oral Once  . thiamine  100 mg Oral Daily   Or  . thiamine  100 mg Intravenous Daily   Continuous Infusions:   . sodium chloride 20 mL/hr at 11/13/12 0130    Levon Hedger MS, RD, LDN [phone] Pager [phone] After Hours Pager

## 2012-11-15 NOTE — Progress Notes (Signed)
Physical Therapy Treatment Patient Details Name: [name] [name] MRN: [id number] DOB: Feb 21, 1941 Today's Date: 11/15/2012 Time: 1010-1058 PT Time Calculation (min): 48 min  PT Assessment / Plan / Recommendation Comments on Treatment Session  Pt. is improving slowly in functional mobility. Pt. did ambulate 8 ft. today. Pt. may benefit from CIR consult. Pt. and son agreeable to consult if MD approves. continue PT this PM.    Follow Up Recommendations  CIR;Supervision/Assistance - 24 hour     Does the patient have the potential to tolerate intense rehabilitation     Barriers to Discharge        Equipment Recommendations  Rolling walker with 5" wheels    Recommendations for Other Services    Frequency 7X/week   Plan Discharge plan needs to be updated;Frequency remains appropriate    Precautions / Restrictions Precautions Precaution Comments: monitor VS Restrictions Weight Bearing Restrictions: No RLE Weight Bearing: Weight bearing as tolerated   Pertinent Vitals/Pain sats 90% 2 l HR 115 for ambulation of 8 ft. Return to 94%    Mobility  Bed Mobility Bed Mobility: Supine to Sit Supine to Sit: 3: Mod assist;HOB elevated;With rails Sitting - Scoot to Edge of Bed: 3: Mod assist Details for Bed Mobility Assistance: extra time taken for mobility, TC for scooting hips to edge,    Exercises Total Joint Exercises Ankle Circles/Pumps: AROM;Both;10 reps Long Arc Quad: AROM;Both;10 reps;Seated Marching in Standing: AROM;Both;10 reps;Seated   PT Diagnosis:    PT Problem List:   PT Treatment Interventions:     PT Goals Acute Rehab PT Goals Pt will go Supine/Side to Sit: with supervision PT Goal: Supine/Side to Sit - Progress: Progressing toward goal Pt will go Sit to Stand: with supervision PT Goal: Sit to Stand - Progress: Progressing toward goal Pt will Ambulate: 51 - 150 feet;with supervision;with rolling walker PT Goal: Ambulate - Progress: Progressing toward goal Pt will  Perform Home Exercise Program: with supervision, verbal cues required/provided PT Goal: Perform Home Exercise Program - Progress: Progressing toward goal  Visit Information  Last PT Received On: 11/15/12 Assistance Needed: +2    Subjective Data  Subjective: I want to try to walk.   Cognition  Overall Cognitive Status: Appears within functional limits for tasks assessed/performed Area of Impairment: Attention Arousal/Alertness: Awake/alert Orientation Level: Appears intact for tasks assessed Behavior During Session: [facility] for tasks performed Current Attention Level: Sustained    Balance  Static Sitting Balance Static Sitting - Balance Support: Feet supported;No upper extremity supported Static Sitting - Level of Assistance: 5: Stand by assistance  End of Session PT - End of Session Equipment Utilized During Treatment: Oxygen Activity Tolerance: Patient tolerated treatment well Patient left: in chair;with call bell/phone within reach;with family/visitor present Nurse Communication: Mobility status   GP     Rada Hay 11/15/2012, 11:10 AM

## 2012-11-15 NOTE — Progress Notes (Signed)
Physical Therapy Treatment Patient Details Name: [name] [name] MRN: [id number] DOB: [birth year] Today's Date: 11/15/2012 Time: 1191-4782 PT Time Calculation (min): 23 min  PT Assessment / Plan / Recommendation Comments on Treatment Session  Pt. is improving.  Sats droped to 89% during ambulation on 2 l.  Pt will benefit fromCIR prior to DC.    Follow Up Recommendations  CIR     Does the patient have the potential to tolerate intense rehabilitation     Barriers to Discharge        Equipment Recommendations  Rolling walker with 5" wheels    Recommendations for Other Services Rehab consult  Frequency 7X/week   Plan Discharge plan remains appropriate;Frequency remains appropriate    Precautions / Restrictions Precautions Precaution Comments: monitor O2. Restrictions RLE Weight Bearing: Weight bearing as tolerated   Pertinent Vitals/Pain sats to 88-89 on 2 l while walking.    Mobility  Bed Mobility Sit to Supine: 4: Min assist Details for Bed Mobility Assistance: assist to place RLE onto bed. Transfers Sit to Stand: 4: Min assist;3: Mod assist (min from recliner; mod from 3:1) Stand to Sit: 4: Min guard;To bed Details for Transfer Assistance: min cues for hand placement Ambulation/Gait Ambulation/Gait Assistance: 4: Min assist Ambulation Distance (Feet): 15 Feet (x2 ) Assistive device: Rolling walker Ambulation/Gait Assistance Details: Min cues for posture, positon inside RW. Gait velocity: decreased    Exercises Total Joint Exercises Short Arc Quad: AROM;Right;10 reps;Supine Heel Slides: AAROM;Right;10 reps;Supine Hip ABduction/ADduction: AROM;Right;10 reps;Supine   PT Diagnosis:    PT Problem List:   PT Treatment Interventions:     PT Goals Acute Rehab PT Goals Pt will go Sit to Supine/Side: with supervision PT Goal: Sit to Supine/Side - Progress: Progressing toward goal Pt will go Sit to Stand: with supervision PT Goal: Sit to Stand - Progress: Progressing  toward goal Pt will Ambulate: 51 - 150 feet;with supervision;with rolling walker  Visit Information  Last PT Received On: 11/15/12 Assistance Needed: +2 (to walk out in hallway, for equipment.)    Subjective Data  Subjective: I have done a lot   Cognition  Overall Cognitive Status: Appears within functional limits for tasks assessed/performed Arousal/Alertness: Awake/alert Orientation Level: Appears intact for tasks assessed Behavior During Session: [facility] for tasks performed    Balance     End of Session PT - End of Session Equipment Utilized During Treatment: Oxygen Activity Tolerance: Patient tolerated treatment well Patient left: in bed;with call bell/phone within reach;with bed alarm set;with family/visitor present   GP     Rada Hay 11/15/2012, 4:53 PM [phone]

## 2012-11-15 NOTE — Progress Notes (Signed)
Occupational Therapy Treatment Patient Details Name: [name] [name] MRN: [id number] DOB: [birth year] Today's Date: 11/15/2012 Time: 4098-1191 OT Time Calculation (min): 20 min  OT Assessment / Plan / Recommendation Comments on Treatment Session Pt is making good gains in OT.  Fatiques easily and 02 sats dropped on 2 liters of 02.    Follow Up Recommendations  CIR    Barriers to Discharge       Equipment Recommendations  3 in 1 bedside comode    Recommendations for Other Services    Frequency     Plan      Precautions / Restrictions Restrictions Weight Bearing Restrictions: No RLE Weight Bearing: Weight bearing as tolerated   Pertinent Vitals/Pain No c/o pain.  Initially sats 98% on 2 liters.  Reading 89% after walking back from bathroom, remaining on 2 liters the entire tx.  Sats quickly returned 90%.    ADL  Grooming: Performed;Min guard;Wash/dry hands;Teeth care Where Assessed - Grooming: Supported standing Toilet Transfer: Performed;Minimal assistance (mod a for sit to stand from 3:1 commode; min cues) Toilet Transfer Method: Sit to stand Toilet Transfer Equipment: Raised toilet seat with arms (or 3-in-1 over toilet) Toileting - Clothing Manipulation and Hygiene: Performed;Minimal assistance Where Assessed - Engineer, mining and Hygiene: Standing Equipment Used: Rolling walker Transfers/Ambulation Related to ADLs: ambulated to bathroom; on 2 liters 02 ADL Comments: Pt a little shaky during above adls--needing min guard A for safety   OT Diagnosis:    OT Problem List:   OT Treatment Interventions:     OT Goals Acute Rehab OT Goals Time For Goal Achievement: 11/14/12 ADL Goals Pt Will Perform Grooming: with supervision;Standing at sink ADL Goal: Grooming - Progress: Progressing toward goals Pt Will Transfer to Toilet: with supervision;Ambulation;with DME;Stand pivot transfer ADL Goal: Toilet Transfer - Progress: Progressing toward goals Pt Will  Perform Toileting - Hygiene: with supervision;Sit to stand from 3-in-1/toilet ADL Goal: Toileting - Hygiene - Progress: Met  Visit Information  Last OT Received On: 11/15/12 Assistance Needed: +1 PT/OT Co-Evaluation/Treatment: Yes    Subjective Data      Prior Functioning       Cognition  Overall Cognitive Status: Appears within functional limits for tasks assessed/performed Arousal/Alertness: Awake/alert Orientation Level: Appears intact for tasks assessed Behavior During Session: [facility] for tasks performed    Mobility  Shoulder Instructions Transfers Sit to Stand: 4: Min assist;3: Mod assist (min from recliner; mod from 3:1) Details for Transfer Assistance: min cues for hand placement       Exercises      Balance     End of Session OT - End of Session Activity Tolerance: Patient tolerated treatment well Patient left: in bed;with call bell/phone within reach (PT present, performing LE exercises)  GO     Brayli Klingbeil 11/15/2012, 3:41 PM Marica Otter, OTR/L [phone] 11/15/2012

## 2012-11-15 NOTE — Progress Notes (Signed)
Patient received from stepdown alert and oriented with complaint of pain of 8/10, medicated with tylenol with codeine.  Patient sitting in recliner with sister present, oriented to room, ordering meals, safety, calling for staff as needed, and addressed any needs that she feels she may require.  She states she would like a rehab facility to assist, discussed options, case Production designer, theatre/television/film and social worker notified.  PT recommends per their note and phone calls CIR referral.

## 2012-11-15 NOTE — Progress Notes (Signed)
PULMONARY  / CRITICAL CARE MEDICINE  Name: [name] [name] MRN: [id number] DOB: 1941/02/01    LOS: 5  REFERRING MD :  TRH  CHIEF COMPLAINT:  Acute respiratory distress  BRIEF PATIENT DESCRIPTION: 71 yo smoker without history of lung disease s/p elective total hip arthroplasty on 12/5, developed hypoxia on 12/8.  CT angio was negative for PE but showed bilateral asymmetric ground glass opacities. BNP 1378 - diuresed, but developed progressive hypoxia. PCCM consulted on 12/9, BiPAP started. TTE - nml LVEF, no WMA. Intubated 12-10  LINES / TUBES: 12-10 7.5 ott 23 @teeth >> 11/13/2012 self extubated  12-10 L i j cvl 20 cm>>12-16 12-10 aline>>out  CULTURES: 12-10 sputum>>> normal flora Urine 12/10>>>NTD as of 12/15/1 Sputum 12/10>>>normal flora    Lab 11/09/12 0330  PROCALCITON 1.59     ANTIBIOTICS: Aztreonam 12/8 >>>11/13/12 Vancomycin 12/8 >>>11/12/12 Levaquin 12/8 >>>12-16 Mycelex 12-16 >>     SIGNIFICANT EVENTS:  12/5  Elective total hip arthroplasty. 12/8  Developed hypoxia, abx began, diuresed. 12/9  TTE - LVF wnl.  Progressive hypoxia, NRB, BiPAP. 12-10 intubation 12-11 Changed to APRV ventilation 11/12/12: Meets sBT criteria (currently on APRV) but CXR shows diffuse ALI still and gets very agitated on WUA (neednig high doses of versed gtt) 12-15 self extubated 12-17 tx to floor  SUBJECTIVE/OVERNIGHT/INTERVAL HX  11/15/12: Awake and in no distress.  VITAL SIGNS: BP 126/48  Pulse 57  Temp 99.7 F (37.6 C) (Oral)  Resp 16  Ht 5\' 2"  (1.575 m)  Wt 56.6 kg (124 lb 12.5 oz)  BMI 22.82 kg/m2  SpO2 96%  INTAKE / OUTPUT:       Intake/Output      12/16 0701 - 12/17 0700 12/17 0701 - 12/18 0700   P.O. 400    I.V. (mL/kg) 130 (2.3)    IV Piggyback     Total Intake(mL/kg) 530 (9.4)    Urine (mL/kg/hr) 1710 (1.3)    Total Output 1710    Net -1180         Stool Occurrence 1 x        Filed Weights   11/12/12 0000 11/13/12 0431 11/15/12 0400  Weight:  62.9 kg (138 lb 10.7 oz) 62.2 kg (137 lb 2 oz) 56.6 kg (124 lb 12.5 oz)     PHYSICAL EXAMINATION: General: Awake and interactive Neuro:  intact. HEENT:  PERRL, ++thrush Cardiovascular:  Irregular rhythm, no murmurs Lungs:  Bilateral diminished air entry,  rales in bases but decreased 12-17 Abdomen:  Soft, non tender, ++bs Musculoskeletal:  Moves all extremities, no edema, rt hip wound intact Skin:  Intact  LABS:  Lab 11/15/12 0345 11/14/12 0815 11/13/12 1800  NA 139 139 137  K 3.9 3.6 3.5  CL 101 95* 93*  CO2 30 35* 36*  BUN 24* 29* 29*  CREATININE 0.70 0.77 0.80  GLUCOSE 108* 133* 123*    Lab 11/15/12 0345 11/14/12 0500 11/13/12 0430  HGB 10.1* 10.2* 9.6*  HCT 30.5* 30.2* 29.1*  WBC 18.6* 17.6* 10.9*  PLT 420* 444* 368   ABG    Component Value Date/Time   PHART 7.473* 11/12/2012 1830   PCO2ART 53.4* 11/12/2012 1830   PO2ART 144.0* 11/12/2012 1830   HCO3 38.6* 11/12/2012 1830   TCO2 35.3 11/12/2012 1830   ACIDBASEDEF 6.4* 11/11/2012 1752   O2SAT 99.5 11/12/2012 1830   IMAGING: Dg Chest Port 1 View  11/15/2012  *RADIOLOGY REPORT*  Clinical Data: Pulmonary edema  PORTABLE CHEST - 1 VIEW  Comparison: 11/14/2012; 11/12/2012;  11/06/2012; chest CT - 11/06/2012  Findings: Grossly unchanged cardiac silhouette and mediastinal contours given persistently reduced lung volumes. Atherosclerotic calcifications within the thoracic and abdominal aorta.  Interval removal of left jugular approach central venous catheter.  Grossly unchanged bilateral heterogeneous perihilar predominant airspace opacities.  No new focal airspace opacities.  No definite pleural effusion or pneumothorax.  Unchanged bones.  IMPRESSION: Grossly unchanged extensive bilateral airspace opacities, again, worrisome for pulmonary edema, though atypical infection may have a similar appearance.   Original Report Authenticated By: Tacey Ruiz, MD    Dg Chest Port 1 View  11/14/2012  *RADIOLOGY REPORT*  Clinical  Data: Hypertension, rales  PORTABLE CHEST - 1 VIEW  Comparison: 11/13/2012  Findings: The heart size remains normal.  No change in left IJ catheter.  The endotracheal and nasogastric tubes have been removed.  Mild interstitial prominence persists bilaterally without focal changes.  No effusions or pneumothoraces.  IMPRESSION: Interstitial prominence persists.  No new findings.  The endotracheal and nasogastric tubes have been removed.   Original Report Authenticated By: Sander Radon, M.D.    ECG: 12/8 >>> LVH, NSR 2D: The cavity size was normal. Wall thickness was increased in a pattern of mild LVH. Systolic function was vigorous. The estimated ejection fraction was in the range of 65% to 70%.  DIAGNOSES: Principal Problem:  *Hypoxemia Active Problems:  Degenerative arthritis of hip  Confusion  Fever  S/P total hip arthroplasty  CHF (congestive heart failure)  HCAP (healthcare-associated pneumonia)  Acute respiratory failure  Normocytic anemia  Hypokalemia  Acute diastolic heart failure  ASSESSMENT / PLAN:  PULMONARY  A: Acute respiratory failure, hypoxemic (DDx:  ARDS, Pneumonitis, HCAP, Pulmonary edema, NSIP, RB-ILD, HP).  Self extubated P:   - change to po pred- rapid taper over 3ds to off   CARDIOVASCULAR  Lab 11/13/12 0430  PROBNP 2486.0*    A: Shock resolved Echo - LVH, nml LVEF  Intake/Output Summary (Last 24 hours) at 11/15/12 0817 Last data filed at 11/15/12 0400  Gross per 24 hour  Intake    510 ml  Output   1310 ml  Net   -800 ml    P:  -  lasix  changed to po daily. as to not over diurese.  - Monitor lytes    RENAL  Lab 11/15/12 0345 11/14/12 0815 11/13/12 1800 11/13/12 0430 11/12/12 0440 11/11/12 0455 11/10/12 0405  NA 139 139 137 139 141 -- --  K 3.9 3.6 -- -- -- -- --  CL 101 95* 93* 95* 100 -- --  CO2 30 35* 36* 37* 34* -- --  GLUCOSE 108* 133* 123* 133* 136* -- --  BUN 24* 29* 29* 33* 31* -- --  CREATININE 0.70 0.77 0.80 0.69 0.80 --  --  CALCIUM 9.2 9.6 9.3 9.0 9.2 -- --  MG 1.9 -- -- 2.1 1.9 2.5 2.5  PHOS 3.1 -- -- 2.5 4.5 2.2* 2.7    A:  Hypokalemia   Lab 11/15/12 0345 11/14/12 0815 11/13/12 1800  K 3.9 3.6 3.5     P:   -replete as needed    GASTROINTESTINAL  A:  No active issues. No overt need for swallow evaluation. P:   - eating  INFECTIOUS   Lab 11/09/12 0330  PROCALCITON 1.59    A:  Suspected HCAP.  PCN allergy. On 11/14/2012  Afebrile and off pressors. Culture negative so far 12-16 12-16 + thrush  . P:   - Antibiotics as above  - Continue  levaquin (total 8 days) -12-16 started antifungal    NEURO A: At risk for ICU delirium. 12-17 remains neuro intact  With less chance of delirium.  P Dc'd sedation gtt Dc'd haldol Thiamine and folic acid to continue   Son and daughter extensively  updated at bedside. 12-17 will change to floor status, decrease diuretic to po daily(near dry weight) Will need SNF & O2 evaluation on dc    Steve Minor ACNP [facility] P[facility] Pager [phone] till 3 pm If no answer page [phone]  Independently examined pt, evaluated data & formulated above care plan with NP  [facility].

## 2012-11-16 ENCOUNTER — Inpatient Hospital Stay (HOSPITAL_COMMUNITY)
Admission: RE | Admit: 2012-11-16 | Discharge: 2012-11-21 | DRG: 945 | Disposition: A | Discharge status: Home Health | Payer: Medicare Other | Source: Ambulatory Visit | Attending: Physical Medicine & Rehabilitation | Admitting: Physical Medicine & Rehabilitation

## 2012-11-16 DIAGNOSIS — Z96649 Presence of unspecified artificial hip joint: Secondary | ICD-10-CM

## 2012-11-16 DIAGNOSIS — J9600 Acute respiratory failure, unspecified whether with hypoxia or hypercapnia: Secondary | ICD-10-CM | POA: Diagnosis not present

## 2012-11-16 DIAGNOSIS — I10 Essential (primary) hypertension: Secondary | ICD-10-CM | POA: Diagnosis not present

## 2012-11-16 DIAGNOSIS — F17200 Nicotine dependence, unspecified, uncomplicated: Secondary | ICD-10-CM | POA: Diagnosis not present

## 2012-11-16 DIAGNOSIS — R578 Other shock: Secondary | ICD-10-CM | POA: Diagnosis present

## 2012-11-16 DIAGNOSIS — Z5189 Encounter for other specified aftercare: Principal | ICD-10-CM

## 2012-11-16 DIAGNOSIS — R911 Solitary pulmonary nodule: Secondary | ICD-10-CM | POA: Diagnosis present

## 2012-11-16 DIAGNOSIS — E785 Hyperlipidemia, unspecified: Secondary | ICD-10-CM | POA: Diagnosis present

## 2012-11-16 DIAGNOSIS — D72829 Elevated white blood cell count, unspecified: Secondary | ICD-10-CM | POA: Diagnosis present

## 2012-11-16 DIAGNOSIS — R0902 Hypoxemia: Secondary | ICD-10-CM | POA: Diagnosis present

## 2012-11-16 DIAGNOSIS — M1610 Unilateral primary osteoarthritis, unspecified hip: Secondary | ICD-10-CM | POA: Diagnosis not present

## 2012-11-16 DIAGNOSIS — B370 Candidal stomatitis: Secondary | ICD-10-CM | POA: Diagnosis not present

## 2012-11-16 DIAGNOSIS — M169 Osteoarthritis of hip, unspecified: Secondary | ICD-10-CM | POA: Diagnosis not present

## 2012-11-16 DIAGNOSIS — I5031 Acute diastolic (congestive) heart failure: Secondary | ICD-10-CM | POA: Diagnosis present

## 2012-11-16 DIAGNOSIS — D62 Acute posthemorrhagic anemia: Secondary | ICD-10-CM | POA: Diagnosis not present

## 2012-11-16 DIAGNOSIS — J189 Pneumonia, unspecified organism: Secondary | ICD-10-CM | POA: Diagnosis not present

## 2012-11-16 DIAGNOSIS — I509 Heart failure, unspecified: Secondary | ICD-10-CM | POA: Diagnosis present

## 2012-11-16 LAB — BASIC METABOLIC PANEL
Calcium: 9.4 mg/dL (ref 8.4–10.5)
Creatinine, Ser: 0.7 mg/dL (ref 0.50–1.10)
GFR calc non Af Amer: 86 mL/min — ABNORMAL LOW (ref >90)
Glucose, Bld: 91 mg/dL (ref 70–99)
Sodium: 140 mEq/L (ref 135–145)

## 2012-11-16 LAB — CBC
Hemoglobin: 11.1 g/dL — ABNORMAL LOW (ref 12.0–15.0)
MCH: 31 pg (ref 26.0–34.0)
MCHC: 32.6 g/dL (ref 30.0–36.0)
MCV: 95 fL (ref 78.0–100.0)

## 2012-11-16 LAB — GLUCOSE, CAPILLARY: Glucose-Capillary: 134 mg/dL — ABNORMAL HIGH (ref 70–99)

## 2012-11-16 MED ORDER — LEVOFLOXACIN 750 MG PO TABS
750.0000 mg | ORAL_TABLET | Freq: Every day | ORAL | Status: DC
  Administered 2012-11-16: 750 mg via ORAL
  Filled 2012-11-16: qty 1

## 2012-11-16 MED ORDER — FLEET ENEMA 7-19 GM/118ML RE ENEM: 1.0000 | ENEMA | Freq: Once | RECTAL | Status: AC | PRN

## 2012-11-16 MED ORDER — ALUM & MAG HYDROXIDE-SIMETH 200-200-20 MG/5ML PO SUSP: 30.0000 mL | ORAL | Status: DC | PRN

## 2012-11-16 MED ORDER — FUROSEMIDE 20 MG PO TABS
20.0000 mg | ORAL_TABLET | Freq: Every day | ORAL | Status: AC
  Administered 2012-11-17 – 2012-11-18 (×2): 20 mg via ORAL
  Filled 2012-11-16 (×3): qty 1

## 2012-11-16 MED ORDER — METHOCARBAMOL 500 MG PO TABS: 500.0000 mg | ORAL_TABLET | Freq: Four times a day (QID) | ORAL | Status: DC | PRN

## 2012-11-16 MED ORDER — FERROUS SULFATE 325 (65 FE) MG PO TABS
325.0000 mg | ORAL_TABLET | Freq: Three times a day (TID) | ORAL | Status: DC
  Administered 2012-11-17 – 2012-11-21 (×13): 325 mg via ORAL
  Filled 2012-11-16 (×16): qty 1

## 2012-11-16 MED ORDER — ACETAMINOPHEN-CODEINE #3 300-30 MG PO TABS
1.0000 | ORAL_TABLET | Freq: Four times a day (QID) | ORAL | Status: DC | PRN
  Administered 2012-11-17 – 2012-11-21 (×6): 1 via ORAL
  Filled 2012-11-16 (×6): qty 1

## 2012-11-16 MED ORDER — TRAZODONE HCL 50 MG PO TABS
25.0000 mg | ORAL_TABLET | Freq: Every evening | ORAL | Status: DC | PRN
  Administered 2012-11-17: 50 mg via ORAL
  Filled 2012-11-16: qty 1

## 2012-11-16 MED ORDER — DIPHENHYDRAMINE HCL 12.5 MG/5ML PO ELIX: 12.5000 mg | ORAL_SOLUTION | Freq: Four times a day (QID) | ORAL | Status: DC | PRN

## 2012-11-16 MED ORDER — ALBUTEROL SULFATE (5 MG/ML) 0.5% IN NEBU: 2.5000 mg | INHALATION_SOLUTION | Freq: Four times a day (QID) | RESPIRATORY_TRACT | Status: DC | PRN

## 2012-11-16 MED ORDER — POLYETHYLENE GLYCOL 3350 17 G PO PACK
17.0000 g | PACK | Freq: Every day | ORAL | Status: DC | PRN
  Filled 2012-11-16: qty 1

## 2012-11-16 MED ORDER — DOCUSATE SODIUM 50 MG/5ML PO LIQD
100.0000 mg | Freq: Two times a day (BID) | ORAL | Status: DC
  Administered 2012-11-16 – 2012-11-21 (×6): 100 mg via ORAL
  Filled 2012-11-16 (×13): qty 10

## 2012-11-16 MED ORDER — PROCHLORPERAZINE MALEATE 5 MG PO TABS
5.0000 mg | ORAL_TABLET | Freq: Four times a day (QID) | ORAL | Status: DC | PRN
  Filled 2012-11-16: qty 2

## 2012-11-16 MED ORDER — NYSTATIN 100000 UNIT/ML MT SUSP
5.0000 mL | Freq: Three times a day (TID) | OROMUCOSAL | Status: DC
  Administered 2012-11-17: 500000 [IU] via ORAL
  Administered 2012-11-17: 50000 [IU] via ORAL
  Administered 2012-11-17 – 2012-11-21 (×11): 500000 [IU] via ORAL
  Filled 2012-11-16 (×15): qty 5

## 2012-11-16 MED ORDER — PROCHLORPERAZINE 25 MG RE SUPP
12.5000 mg | Freq: Four times a day (QID) | RECTAL | Status: DC | PRN
  Filled 2012-11-16: qty 1

## 2012-11-16 MED ORDER — AMLODIPINE BESYLATE 5 MG PO TABS
5.0000 mg | ORAL_TABLET | Freq: Every day | ORAL | Status: DC
  Administered 2012-11-17 – 2012-11-21 (×5): 5 mg via ORAL
  Filled 2012-11-16 (×6): qty 1

## 2012-11-16 MED ORDER — ACETAMINOPHEN-CODEINE #3 300-30 MG PO TABS: 1.0000 | ORAL_TABLET | Freq: Four times a day (QID) | ORAL | Status: DC | PRN

## 2012-11-16 MED ORDER — BIOTENE DRY MOUTH MT LIQD
15.0000 mL | Freq: Four times a day (QID) | OROMUCOSAL | Status: DC
  Administered 2012-11-17 – 2012-11-21 (×11): 15 mL via OROMUCOSAL

## 2012-11-16 MED ORDER — NYSTATIN 100000 UNIT/ML MT SUSP
5.0000 mL | Freq: Three times a day (TID) | OROMUCOSAL | Status: DC
  Administered 2012-11-16 (×2): 500000 [IU] via ORAL
  Filled 2012-11-16 (×3): qty 5

## 2012-11-16 MED ORDER — LEVOFLOXACIN 750 MG PO TABS: 750.0000 mg | ORAL_TABLET | Freq: Every day | ORAL | Status: DC

## 2012-11-16 MED ORDER — BISACODYL 10 MG RE SUPP: 10.0000 mg | Freq: Every day | RECTAL | Status: DC | PRN

## 2012-11-16 MED ORDER — GUAIFENESIN-DM 100-10 MG/5ML PO SYRP: 5.0000 mL | ORAL_SOLUTION | Freq: Four times a day (QID) | ORAL | Status: DC | PRN

## 2012-11-16 MED ORDER — POLYVINYL ALCOHOL 1.4 % OP SOLN
1.0000 [drp] | OPHTHALMIC | Status: DC | PRN
  Filled 2012-11-16: qty 15

## 2012-11-16 MED ORDER — FUROSEMIDE 20 MG PO TABS: 20.0000 mg | ORAL_TABLET | Freq: Every day | ORAL | Status: DC

## 2012-11-16 MED ORDER — PROCHLORPERAZINE EDISYLATE 5 MG/ML IJ SOLN
5.0000 mg | Freq: Four times a day (QID) | INTRAMUSCULAR | Status: DC | PRN
  Filled 2012-11-16: qty 2

## 2012-11-16 MED ORDER — FOLIC ACID 1 MG PO TABS
1.0000 mg | ORAL_TABLET | Freq: Every day | ORAL | Status: DC
  Administered 2012-11-17 – 2012-11-21 (×5): 1 mg via ORAL
  Filled 2012-11-16 (×6): qty 1

## 2012-11-16 MED ORDER — IPRATROPIUM BROMIDE 0.02 % IN SOLN: 0.5000 mg | Freq: Four times a day (QID) | RESPIRATORY_TRACT | Status: DC | PRN

## 2012-11-16 MED ORDER — ADULT MULTIVITAMIN W/MINERALS CH
1.0000 | ORAL_TABLET | Freq: Every day | ORAL | Status: DC
  Administered 2012-11-17 – 2012-11-21 (×5): 1 via ORAL
  Filled 2012-11-16 (×6): qty 1

## 2012-11-16 MED ORDER — PREDNISONE 10 MG PO TABS
10.0000 mg | ORAL_TABLET | Freq: Once | ORAL | Status: AC
  Administered 2012-11-17: 10 mg via ORAL
  Filled 2012-11-16: qty 1

## 2012-11-16 MED ORDER — ENOXAPARIN SODIUM 40 MG/0.4ML ~~LOC~~ SOLN
40.0000 mg | Freq: Every day | SUBCUTANEOUS | Status: DC
  Administered 2012-11-17 – 2012-11-21 (×5): 40 mg via SUBCUTANEOUS
  Filled 2012-11-16 (×6): qty 0.4

## 2012-11-16 MED ORDER — METHOCARBAMOL 500 MG PO TABS
500.0000 mg | ORAL_TABLET | Freq: Four times a day (QID) | ORAL | Status: DC | PRN
  Administered 2012-11-17: 500 mg via ORAL
  Filled 2012-11-16: qty 1

## 2012-11-16 MED ORDER — PREDNISONE 10 MG PO TABS: 10.0000 mg | ORAL_TABLET | Freq: Once | ORAL | Status: DC

## 2012-11-16 NOTE — Progress Notes (Signed)
Admitting patient to CIR today. Have received message from Dr Magnus Ivan that pt is medically ready to come to CIR today. Pt's CM and nurse are aware. Pt is in agreement with plan. For questions, call [phone]

## 2012-11-16 NOTE — Progress Notes (Signed)
Patient ID: [name] [name], female   DOB: Sep 07, 1941, 71 y.o.   MRN: [id number] Feeling better each day. Motivated.  Working again with PT/OT.  Incision looks great.  WBC still high.  Will continue pulmonary toilet.  MD rehab consult today.  Would benefit from inpatient rehab given improved mobility, but continued need for pulmonary rehab as well.  Advance diet to regular.

## 2012-11-16 NOTE — H&P (Signed)
Physical Medicine and Rehabilitation Admission H&P    CC: Endstage DJD right hip.  HPI: [name] [name] is a 71 y.o. female with history of lung disease, HTN, right hip pain due to endstage DJD and failure of conservative therapy. Patient elected to undergo R-THR on 11/03/12 by Dr. Magnus Ivan. Post op course complicated by hypoxia due to acute CHF and was diuresed per Dr. Ludwig Clarks input. Cardiac echo done with normal EF. Patient with developed acute respiratory distress requiring intubation on 12/10. She was started on IV antibiotics for suspected HCAP and pressors for hypotensive shock. She has had problems with agitation and self extubated on 11/13/12.   Delirium resolved. Hypoxia slowly improving and CCM feels that patient may need oxygen at discharge. Cultures all negative. Antibiotics discontinued today. Slow rise in WBC seen but no signs of infection--monitor for now per Dr. Vassie Loll. Therapies resumed and patient is WBAT with anterior hip precautions. PT, OT , MD recommending CIR for progression.    Review of Systems  HENT: Negative for hearing loss and neck pain.   Eyes: Negative for blurred vision and double vision.  Respiratory: Negative for shortness of breath.   Cardiovascular: Negative for chest pain and palpitations.  Gastrointestinal: Negative for heartburn and nausea.  Musculoskeletal: Negative for myalgias and back pain.  Neurological: Negative for headaches.  Psychiatric/Behavioral: The patient does not have insomnia.    Past Medical History  Diagnosis Date  . Hypertension   . Arthritis     a. 10/2012 s/p Right THA.  Marland Kitchen Hyperlipidemia   . Pulmonary nodule     a. 10/2012 CT: 13mm anterior LUL nodule w/ spiculated appearance - rec PET CT.   Past Surgical History  Procedure Date  . Tonsillectomy   . Total hip arthroplasty 11/03/2012    Procedure: TOTAL HIP ARTHROPLASTY ANTERIOR APPROACH;  Surgeon: Kathryne Hitch, MD;  Location: [facility];  Service: Orthopedics;   Laterality: Right;  Right Total Hip Arthroplasty, Anterior Approach (C-Arm)  . Tubal ligation    Family History  Problem Relation Age of Onset  . CAD Father    Social History:  Lives alone. Works as a Librarian, academic. She reports that she has been smoking Cigarettes--1/2 to 1 PPD.  She has a 40 pack-year smoking history. She does not have any smokeless tobacco history on file. She reports that she drinks alcohol-2 drinks/nights.  She reports that she does not use illicit drugs.  Allergies  Allergen Reactions  . Penicillins Hives and Swelling    Scheduled Meds:   . amLODipine  5 mg Oral Daily  . antiseptic oral rinse  15 mL Mouth Rinse QID  . chlorhexidine  15 mL Mouth Rinse BID  . docusate  100 mg Oral BID  . enoxaparin (LOVENOX) injection  30 mg Subcutaneous Q12H  . ferrous sulfate  325 mg Oral TID WC  . folic acid  1 mg Oral Daily  . furosemide  20 mg Oral Daily  . insulin aspart  2-6 Units Subcutaneous Q4H  . ipratropium  0.5 mg Nebulization Q6H  . multivitamin with minerals  1 tablet Oral Daily  . nystatin  5 mL Oral TID  . predniSONE  10 mg Oral Once  . thiamine  100 mg Oral Daily   Or  . thiamine  100 mg Intravenous Daily    Medications Prior to Admission  Medication Sig Dispense Refill  . amLODipine-benazepril (LOTREL) 5-20 MG per capsule Take 1 capsule by mouth every morning.      Marland Kitchen  atorvastatin (LIPITOR) 20 MG tablet Take 20 mg by mouth daily before breakfast.       . carboxymethylcellulose (REFRESH TEARS) 0.5 % SOLN Place 1 drop into both eyes daily as needed. For dry eyes      . Coenzyme Q10 (CO Q 10) 100 MG CAPS Take 300 mg by mouth at bedtime.      Marland Kitchen etodolac (LODINE) 400 MG tablet Take 400 mg by mouth 2 (two) times daily.      . hydrochlorothiazide (HYDRODIURIL) 25 MG tablet Take 25 mg by mouth every morning.      . Multiple Vitamin (MULTIVITAMIN WITH MINERALS) TABS Take 1 tablet by mouth daily.        Home: Home Living Lives With: Alone Available Help at  Discharge: Family Type of Home: House Home Access: Stairs to enter Entrance Stairs-Rails: None Home Layout: Two level;1/2 bath on main level Alternate Level Stairs-Number of Steps: 8+8 Alternate Level Stairs-Rails: None Bathroom Shower/Tub: Health visitor: Standard Home Adaptive Equipment: Environmental consultant - rolling;Raised toilet seat with rails Additional Comments: shower is upstairs; will sponge bathe initially   Functional History: Prior Function Able to Take Stairs?: Yes Driving: Yes Comments: will have help for a week  Functional Status:  Mobility: Bed Mobility Bed Mobility: Supine to Sit Supine to Sit: 5: Supervision Sitting - Scoot to Edge of Bed: 4: Min guard Sit to Supine: 4: Min assist Transfers Transfers: Stand Pivot Transfers Sit to Stand: 4: Min guard;From bed;With upper extremity assist Sit to Stand: Patient Percentage: 60% Stand to Sit: 4: Min guard;To chair/3-in-1;With upper extremity assist Stand Pivot Transfers: 1: +2 Total assist Stand Pivot Transfers: Patient Percentage: 60% Ambulation/Gait Ambulation/Gait Assistance: 4: Min guard;5: Supervision Ambulation Distance (Feet): 68 Feet Assistive device: Rolling walker Ambulation/Gait Assistance Details: Min cues for posture, positon inside RW. Gait Pattern: Step-to pattern;Decreased stance time - right Gait velocity: decreased General Gait Details: increased time Stairs: No Stairs Assistance: 4: Min assist Stairs Assistance Details (indicate cue type and reason): Cues for sequencing/technique with RW and educated daughter on how to assist pt.  Stair Management Technique: No rails;Step to pattern;Backwards;Forwards;With walker Number of Stairs: 2     ADL: ADL Grooming: Performed;Min guard;Wash/dry hands;Teeth care Where Assessed - Grooming: Supported standing Upper Body Bathing: Minimal assistance Where Assessed - Upper Body Bathing: Unsupported sitting Lower Body Bathing: Maximal  assistance Where Assessed - Lower Body Bathing: Supported sit to stand Upper Body Dressing: Minimal assistance Where Assessed - Upper Body Dressing: Unsupported sitting Lower Body Dressing: Maximal assistance Where Assessed - Lower Body Dressing: Supported sit to stand Toilet Transfer: Performed;Minimal assistance (mod a for sit to stand from 3:1 commode; min cues) Toilet Transfer Method: Sit to stand Toilet Transfer Equipment: Raised toilet seat with arms (or 3-in-1 over toilet) Equipment Used: Rolling walker Transfers/Ambulation Related to ADLs: ambulated to bathroom; on 2 liters 02 ADL Comments: Pt a little shaky during above adls.  Pt a little shaky, needing min guard A.  Cognition: Cognition Arousal/Alertness: Awake/alert Orientation Level: Oriented X4 Cognition Overall Cognitive Status: Appears within functional limits for tasks assessed/performed Area of Impairment: Attention Arousal/Alertness: Awake/alert Orientation Level: Appears intact for tasks assessed Behavior During Session: [facility] for tasks performed Current Attention Level: Sustained   Blood pressure 143/71, pulse 63, temperature 98.8 F (37.1 C), temperature source Oral, resp. rate 18, height 5\' 2"  (1.575 m), weight 54.432 kg (120 lb), SpO2 98.00%. Physical Exam  Nursing note and vitals reviewed. Constitutional: She is oriented to person, place, and  time. She appears well-developed and well-nourished. Nasal cannula in place.  HENT:  Head: Normocephalic and atraumatic.  Right Ear: External ear normal.  Left Ear: External ear normal.  Eyes: Conjunctivae normal and EOM are normal. Pupils are equal, round, and reactive to light.  Neck: Normal range of motion. Neck supple. No JVD present. No tracheal deviation present. No thyromegaly present.  Cardiovascular: Normal rate and regular rhythm.  Exam reveals no friction rub.   No murmur heard. Pulmonary/Chest: Effort normal and breath sounds normal. No respiratory  distress. She has no wheezes. She has no rales. She exhibits no tenderness.       Oxygen via [place]  Abdominal: Soft. Bowel sounds are normal. She exhibits no distension. There is no tenderness.  Musculoskeletal: She exhibits no edema and no tenderness.       Mepilex on right hip incision. Discomfort with ROM right hip.  Lymphadenopathy:    She has no cervical adenopathy.  Neurological: She is alert and oriented to person, place, and time. No cranial nerve deficit.       Moves all 4. Right leg limited proximally due to pain. Strength grossly 4/5 ue and 4/5 LLE otherwise. No sensory deficits  Skin: Skin is warm and dry.  Psychiatric: Judgment and thought content normal. Her mood appears anxious.    Results for orders placed during the hospital encounter of 11/03/12 (from the past 48 hour(s))  GLUCOSE, CAPILLARY     Status: Abnormal   Collection Time   11/14/12  3:36 PM      Component Value Range Comment   Glucose-Capillary 135 (*) 70 - 99 mg/dL   GLUCOSE, CAPILLARY     Status: Abnormal   Collection Time   11/14/12  9:55 PM      Component Value Range Comment   Glucose-Capillary 137 (*) 70 - 99 mg/dL    Comment 1 Documented in Chart      Comment 2 Notify RN     GLUCOSE, CAPILLARY     Status: Abnormal   Collection Time   11/14/12 11:57 PM      Component Value Range Comment   Glucose-Capillary 117 (*) 70 - 99 mg/dL   GLUCOSE, CAPILLARY     Status: Abnormal   Collection Time   11/15/12  3:41 AM      Component Value Range Comment   Glucose-Capillary 120 (*) 70 - 99 mg/dL   CBC     Status: Abnormal   Collection Time   11/15/12  3:45 AM      Component Value Range Comment   WBC 18.6 (*) 4.0 - 10.5 K/uL    RBC 3.27 (*) 3.87 - 5.11 MIL/uL    Hemoglobin 10.1 (*) 12.0 - 15.0 g/dL    HCT 28.4 (*) 13.2 - 46.0 %    MCV 93.3  78.0 - 100.0 fL    MCH 30.9  26.0 - 34.0 pg    MCHC 33.1  30.0 - 36.0 g/dL    RDW 44.0  10.2 - 72.5 %    Platelets 420 (*) 150 - 400 K/uL   BASIC METABOLIC PANEL      Status: Abnormal   Collection Time   11/15/12  3:45 AM      Component Value Range Comment   Sodium 139  135 - 145 mEq/L    Potassium 3.9  3.5 - 5.1 mEq/L    Chloride 101  96 - 112 mEq/L    CO2 30  19 - 32 mEq/L  Glucose, Bld 108 (*) 70 - 99 mg/dL    BUN 24 (*) 6 - 23 mg/dL    Creatinine, Ser 1.61  0.50 - 1.10 mg/dL    Calcium 9.2  8.4 - 09.6 mg/dL    GFR calc non Af Amer 86 (*) >90 mL/min    GFR calc Af Amer >90  >90 mL/min   PHOSPHORUS     Status: Normal   Collection Time   11/15/12  3:45 AM      Component Value Range Comment   Phosphorus 3.1  2.3 - 4.6 mg/dL   MAGNESIUM     Status: Normal   Collection Time   11/15/12  3:45 AM      Component Value Range Comment   Magnesium 1.9  1.5 - 2.5 mg/dL   GLUCOSE, CAPILLARY     Status: Normal   Collection Time   11/15/12  8:09 AM      Component Value Range Comment   Glucose-Capillary 95  70 - 99 mg/dL    Comment 1 Notify RN     GLUCOSE, CAPILLARY     Status: Abnormal   Collection Time   11/15/12 11:50 AM      Component Value Range Comment   Glucose-Capillary 157 (*) 70 - 99 mg/dL    Comment 1 Notify RN     GLUCOSE, CAPILLARY     Status: Abnormal   Collection Time   11/15/12  4:32 PM      Component Value Range Comment   Glucose-Capillary 119 (*) 70 - 99 mg/dL    Comment 1 Notify RN     GLUCOSE, CAPILLARY     Status: Abnormal   Collection Time   11/15/12  7:58 PM      Component Value Range Comment   Glucose-Capillary 137 (*) 70 - 99 mg/dL   GLUCOSE, CAPILLARY     Status: Abnormal   Collection Time   11/16/12 12:19 AM      Component Value Range Comment   Glucose-Capillary 104 (*) 70 - 99 mg/dL   GLUCOSE, CAPILLARY     Status: Normal   Collection Time   11/16/12  4:14 AM      Component Value Range Comment   Glucose-Capillary 98  70 - 99 mg/dL   CBC     Status: Abnormal   Collection Time   11/16/12  5:55 AM      Component Value Range Comment   WBC 19.6 (*) 4.0 - 10.5 K/uL    RBC 3.58 (*) 3.87 - 5.11 MIL/uL     Hemoglobin 11.1 (*) 12.0 - 15.0 g/dL    HCT 04.5 (*) 40.9 - 46.0 %    MCV 95.0  78.0 - 100.0 fL    MCH 31.0  26.0 - 34.0 pg    MCHC 32.6  30.0 - 36.0 g/dL    RDW 81.1  91.4 - 78.2 %    Platelets 513 (*) 150 - 400 K/uL   BASIC METABOLIC PANEL     Status: Abnormal   Collection Time   11/16/12  5:55 AM      Component Value Range Comment   Sodium 140  135 - 145 mEq/L    Potassium 4.0  3.5 - 5.1 mEq/L    Chloride 102  96 - 112 mEq/L    CO2 25  19 - 32 mEq/L    Glucose, Bld 91  70 - 99 mg/dL    BUN 23  6 - 23 mg/dL  Creatinine, Ser 0.70  0.50 - 1.10 mg/dL    Calcium 9.4  8.4 - 16.1 mg/dL    GFR calc non Af Amer 86 (*) >90 mL/min    GFR calc Af Amer >90  >90 mL/min   GLUCOSE, CAPILLARY     Status: Abnormal   Collection Time   11/16/12  8:42 AM      Component Value Range Comment   Glucose-Capillary 134 (*) 70 - 99 mg/dL   GLUCOSE, CAPILLARY     Status: Normal   Collection Time   11/16/12 11:46 AM      Component Value Range Comment   Glucose-Capillary 95  70 - 99 mg/dL    Dg Chest Port 1 View  11/15/2012  *RADIOLOGY REPORT*  Clinical Data: Pulmonary edema  PORTABLE CHEST - 1 VIEW  Comparison: 11/14/2012; 11/12/2012; 11/06/2012; chest CT - 11/06/2012  Findings: Grossly unchanged cardiac silhouette and mediastinal contours given persistently reduced lung volumes. Atherosclerotic calcifications within the thoracic and abdominal aorta.  Interval removal of left jugular approach central venous catheter.  Grossly unchanged bilateral heterogeneous perihilar predominant airspace opacities.  No new focal airspace opacities.  No definite pleural effusion or pneumothorax.  Unchanged bones.  IMPRESSION: Grossly unchanged extensive bilateral airspace opacities, again, worrisome for pulmonary edema, though atypical infection may have a similar appearance.   Original Report Authenticated By: Tacey Ruiz, MD     Post Admission Physician Evaluation: 1. Functional deficits secondary  to OA of right hip  s/p right THA with post-op CHF and respiratory failure. 2. Patient is admitted to receive collaborative, interdisciplinary care between the physiatrist, rehab nursing staff, and therapy team. 3. Patient's level of medical complexity and substantial therapy needs in context of that medical necessity cannot be provided at a lesser intensity of care such as a SNF. 4. Patient has experienced substantial functional loss from his/her baseline which was documented above under the "Functional History" and "Functional Status" headings.  Judging by the patient's diagnosis, physical exam, and functional history, the patient has potential for functional progress which will result in measurable gains while on inpatient rehab.  These gains will be of substantial and practical use upon discharge  in facilitating mobility and self-care at the household level. 5. Physiatrist will provide 24 hour management of medical needs as well as oversight of the therapy plan/treatment and provide guidance as appropriate regarding the interaction of the two. 6. 24 hour rehab nursing will assist with bladder management, bowel management, safety, skin/wound care, disease management, medication administration, pain management and patient education  and help integrate therapy concepts, techniques,education, etc. 7. PT will assess and treat for:  Lower extremity strength, range of motion, stamina, balance, functional mobility, safety, adaptive techniques and equipment, pain, hip precautions.  Goals are: mod I. 8. OT will assess and treat for: ADL's, functional mobility, safety, upper extremity strength, adaptive techniques and equipment, pain, hip precautions.   Goals are: mod I. 9. SLP will assess and treat for: n/a.  Goals are: n/a. 10. Case Management and Social Worker will assess and treat for psychological issues and discharge planning. 11. Team conference will be held weekly to assess progress toward goals and to determine barriers to  discharge. 12. Patient will receive at least 3 hours of therapy per day at least 5 days per week. 13. ELOS: 1 week      Prognosis:  excellent   Medical Problem List and Plan: 1. DVT Prophylaxis/Anticoagulation: Pharmaceutical: Lovenox 2. Pain Management: tylenol #3 used on prn  basis with good results. 3. Mood:  No signs of distress noted. Will have LCSW follow for formal evaluation 4. Neuropsych: This patient is capable of making decisions on his/her own behalf. 5. ABLA: Continue iron supplement tid. 6. Suspected HCAP: completed 10 day course of Levaquin on 12/18. 7. Thrush: nystatin mouth wash added.  8. Pulmonary edema: Continue lasix daily for now.  9. Acute respiratory failure: prn nebs. Will need follow up with [facility] for PFTs and decision regarding bronchodilators on outpatient basis. Emphasize tobacco cessation.    Ivory Broad, MD 11/16/2012

## 2012-11-16 NOTE — Progress Notes (Signed)
Met with Patient at bedside to discuss CIR.Patient would benefit from inpatient rehab prior to return home and pt is eager to come to rehab. Await notification for Dr Magnus Ivan that pt is medically stable and ready to d/c to CIR. For questions call [phone]

## 2012-11-16 NOTE — Discharge Summary (Signed)
Patient ID: [name] [name] MRN: [id number] DOB/AGE: January 04, 1941 71 y.o.  Admit date: 11/03/2012 Discharge date: 11/16/2012  Admission Diagnoses:  Principal Problem:  *Hypoxemia Active Problems:  Degenerative arthritis of hip  Confusion  Fever  S/P total hip arthroplasty  CHF (congestive heart failure)  HCAP (healthcare-associated pneumonia)  Acute respiratory failure  Normocytic anemia  Hypokalemia  Acute diastolic heart failure  Hypotension   Discharge Diagnoses:  Same  Past Medical History  Diagnosis Date  . Hypertension   . Arthritis     a. 10/2012 s/p Right THA.  Marland Kitchen Hyperlipidemia   . Pulmonary nodule     a. 10/2012 CT: 13mm anterior LUL nodule w/ spiculated appearance - rec PET CT.    Surgeries: Procedure(s): TOTAL HIP ARTHROPLASTY ANTERIOR APPROACH on 11/03/2012   Consultants:    Discharged Condition: Improved  Hospital Course: [name] [name] is an 71 y.o. female who was admitted 11/03/2012 for operative treatment ofHypoxemia. Patient has severe unremitting pain that affects sleep, daily activities, and work/hobbies. After pre-op clearance the patient was taken to the operating room on 11/03/2012 and underwent  Procedure(s): TOTAL HIP ARTHROPLASTY ANTERIOR APPROACH.    Patient was given perioperative antibiotics: Anti-infectives     Start     Dose/Rate Route Frequency Ordered Stop   11/16/12 0700   levofloxacin (LEVAQUIN) tablet 750 mg  Status:  Discontinued        750 mg Oral Daily 11/16/12 0653 11/16/12 0915   11/16/12 0000   levofloxacin (LEVAQUIN) 750 MG tablet        750 mg Oral Daily 11/16/12 1345     11/08/12 1200   vancomycin (VANCOCIN) 1,250 mg in sodium chloride 0.9 % 250 mL IVPB  Status:  Discontinued        1,250 mg 166.7 mL/hr over 90 Minutes Intravenous Every 12 hours 11/08/12 1103 11/12/12 1127   11/06/12 2200   vancomycin (VANCOCIN) 500 mg in sodium chloride 0.9 % 100 mL IVPB  Status:  Discontinued        500 mg 100 mL/hr over 60  Minutes Intravenous Every 12 hours 11/06/12 0833 11/08/12 1102   11/06/12 1000   aztreonam (AZACTAM) 1 g in dextrose 5 % 50 mL IVPB  Status:  Discontinued        1 g 100 mL/hr over 30 Minutes Intravenous 3 times per day 11/06/12 0835 11/13/12 1008   11/06/12 0930   levofloxacin (LEVAQUIN) IVPB 750 mg  Status:  Discontinued        750 mg 100 mL/hr over 90 Minutes Intravenous Every 24 hours 11/06/12 0833 11/14/12 1232   11/06/12 0900   vancomycin (VANCOCIN) 750 mg in sodium chloride 0.9 % 150 mL IVPB        750 mg 150 mL/hr over 60 Minutes Intravenous  Once 11/06/12 0833 11/06/12 1013   11/03/12 1400   clindamycin (CLEOCIN) IVPB 600 mg        600 mg 100 mL/hr over 30 Minutes Intravenous Every 6 hours 11/03/12 1326 11/03/12 2031   11/03/12 0529   clindamycin (CLEOCIN) IVPB 900 mg        900 mg 100 mL/hr over 30 Minutes Intravenous 60 min pre-op 11/03/12 0529 11/03/12 0732           Patient was given sequential compression devices, early ambulation, and chemoprophylaxis to prevent DVT.  She had a post-operative stay that was complicated by ARDS and required intubation, ventilatory support, pressors, and a prolonged hospital and ICU stay due to ARDS.  Recent vital signs: Patient Vitals for the past 24 hrs:  BP Temp Temp src Pulse Resp SpO2 Weight  11/16/12 0923 - - - - - 98 % -  11/16/12 0600 143/71 mmHg 98.8 F (37.1 C) Oral 63  18  96 % 54.432 kg (120 lb)  11/16/12 0212 - - - - - 99 % -  11/16/12 0200 133/61 mmHg 98.2 F (36.8 C) Oral 60  20  98 % -  2012-12-13 2258 130/67 mmHg 99.1 F (37.3 C) Oral 82  20  96 % -  2012-12-13 1946 - - - - - 98 % -  13-Dec-2012 1405 124/72 mmHg 99 F (37.2 C) Oral 66  18  97 % -     Recent laboratory studies:  Basename 11/16/12 0555 12-13-2012 0345  WBC 19.6* 18.6*  HGB 11.1* 10.1*  HCT 34.0* 30.5*  PLT 513* 420*  NA 140 139  K 4.0 3.9  CL 102 101  CO2 25 30  BUN 23 24*  CREATININE 0.70 0.70  GLUCOSE 91 108*  INR -- --  CALCIUM 9.4 --      Discharge Medications:     Medication List     As of 11/16/2012  1:45 PM    STOP taking these medications         etodolac 400 MG tablet   Commonly known as: LODINE      TAKE these medications         acetaminophen-codeine 300-30 MG per tablet   Commonly known as: TYLENOL #3   Take 1 tablet by mouth every 6 (six) hours as needed.      amLODipine-benazepril 5-20 MG per capsule   Commonly known as: LOTREL   Take 1 capsule by mouth every morning.      atorvastatin 20 MG tablet   Commonly known as: LIPITOR   Take 20 mg by mouth daily before breakfast.      Co Q 10 100 MG Caps   Take 300 mg by mouth at bedtime.      furosemide 20 MG tablet   Commonly known as: LASIX   Take 1 tablet (20 mg total) by mouth daily.      hydrochlorothiazide 25 MG tablet   Commonly known as: HYDRODIURIL   Take 25 mg by mouth every morning.      levofloxacin 750 MG tablet   Commonly known as: LEVAQUIN   Take 1 tablet (750 mg total) by mouth daily.      methocarbamol 500 MG tablet   Commonly known as: ROBAXIN   Take 1 tablet (500 mg total) by mouth every 6 (six) hours as needed.      multivitamin with minerals Tabs   Take 1 tablet by mouth daily.      predniSONE 10 MG tablet   Commonly known as: DELTASONE   Take 1 tablet (10 mg total) by mouth once.      REFRESH TEARS 0.5 % Soln   Generic drug: carboxymethylcellulose   Place 1 drop into both eyes daily as needed. For dry eyes        Diagnostic Studies: Dg Chest 2 View  10/31/2012  *RADIOLOGY REPORT*  Clinical Data: Hypertension.  Preoperative for hip surgery. Tobacco use.  CHEST - 2 VIEW  Comparison: None.  Findings: Mild cardiomegaly noted with tortuosity and atherosclerotic calcification of the thoracic aorta.  Mild interstitial accentuation is present.  Faint Kerley B lines are suspected.  No pleural effusion.  Mild thoracic spondylosis.  IMPRESSION:  1.  Cardiomegaly with mild interstitial accentuation favoring early  interstitial edema over atypical pneumonia. 2.  Atherosclerosis.   Original Report Authenticated By: Gaylyn Rong, M.D.    Dg Hip Complete Right  11/03/2012  *RADIOLOGY REPORT*  Clinical Data: Osteoarthritis of the right hip.  Total hip replacement.  RIGHT HIP - COMPLETE 2+ VIEW  Comparison: Radiographs dated 05/30/2007  Findings: Acetabular and femoral components of the right total hip prosthesis appear in good position.  No fractures.  IMPRESSION: Satisfactory appearance of the right total hip prosthesis in the AP projection.   Original Report Authenticated By: Francene Boyers, M.D.    Ct Angio Chest Pe W/cm &/or Wo Cm  11/06/2012  *RADIOLOGY REPORT*  Clinical Data: Low O2 sats.  Elevated white count.  Post surgery.  CT ANGIOGRAPHY CHEST  Technique:  Multidetector CT imaging of the chest using the standard protocol during bolus administration of intravenous contrast. Multiplanar reconstructed images including MIPs were obtained and reviewed to evaluate the vascular anatomy.  Contrast: OMNIPAQUE IOHEXOL 350 MG/ML SOLN  Comparison: Chest x-ray earlier today.  Findings: No filling defects in the pulmonary arteries to suggest pulmonary emboli.  There is mild cardiomegaly.  Small pericardial effusion.  No pleural effusions.  Ground-glass opacities and interstitial prominence throughout the lungs corresponding to abnormality on chest x-ray.  This is likely infectious or inflammatory or edema. Edema is felt less likely given the mid and upper lung zone predominance of the findings. Lung bases are relatively spared.  13 mm nodule in the anterior left upper lobe on image 21.  This is somewhat spiculated appearance and concerning for neoplasm.  Mild COPD changes.  Borderline sized mediastinal lymph nodes.  No hilar or axillary adenopathy.  Coronary artery calcifications are present.  Visualized thyroid and chest wall soft tissues unremarkable. Imaging into the upper abdomen shows no acute findings.   IMPRESSION: Cardiomegaly.  Ground-glass opacities and interstitial prominence throughout the lungs, most pronounced in the mid and upper lung zones. Differential considerations would include infectious or inflammatory pneumonitis or less likely edema.  Coronary artery disease.  13 mm anterior left upper lobe nodule with spiculated appearance. Cannot exclude malignancy. Consider further evaluation with PET CT when acute symptoms resolve.  Borderline sized mediastinal lymph nodes.   Original Report Authenticated By: Charlett Nose, M.D.    Dg Pelvis Portable  11/03/2012  *RADIOLOGY REPORT*  Clinical Data: Osteoarthritis of the right hip.  PORTABLE PELVIS  Comparison: Intraoperative image dated 11/03/2012  Findings: Acetabular and femoral components of the right total hip prosthesis appear in good position in the AP projection.  No fractures.  IMPRESSION: Satisfactory appearance of the right total hip prosthesis in the AP projection.   Original Report Authenticated By: Francene Boyers, M.D.    Dg Chest Port 1 View  11/15/2012  *RADIOLOGY REPORT*  Clinical Data: Pulmonary edema  PORTABLE CHEST - 1 VIEW  Comparison: 11/14/2012; 11/12/2012; 11/06/2012; chest CT - 11/06/2012  Findings: Grossly unchanged cardiac silhouette and mediastinal contours given persistently reduced lung volumes. Atherosclerotic calcifications within the thoracic and abdominal aorta.  Interval removal of left jugular approach central venous catheter.  Grossly unchanged bilateral heterogeneous perihilar predominant airspace opacities.  No new focal airspace opacities.  No definite pleural effusion or pneumothorax.  Unchanged bones.  IMPRESSION: Grossly unchanged extensive bilateral airspace opacities, again, worrisome for pulmonary edema, though atypical infection may have a similar appearance.   Original Report Authenticated By: Tacey Ruiz, MD    Dg Chest [facility]  11/14/2012  *RADIOLOGY REPORT*  Clinical Data: Hypertension, rales   PORTABLE CHEST - 1 VIEW  Comparison: 11/13/2012  Findings: The heart size remains normal.  No change in left IJ catheter.  The endotracheal and nasogastric tubes have been removed.  Mild interstitial prominence persists bilaterally without focal changes.  No effusions or pneumothoraces.  IMPRESSION: Interstitial prominence persists.  No new findings.  The endotracheal and nasogastric tubes have been removed.   Original Report Authenticated By: Sander Radon, M.D.    Dg Chest Port 1 View  11/13/2012  *RADIOLOGY REPORT*  Clinical Data: Check endotracheal tube  PORTABLE CHEST - 1 VIEW  Comparison: Chest radiograph 11/02/2012  Findings: Endotracheal tube, central venous line, and NG tube are unchanged.  Stable heart silhouette.  There is diffuse fine air space disease not changed from prior.  No pneumothorax. Low lung volumes.  IMPRESSION: 1.  Stable support apparatus. 2.  No interval change. 3.  Diffuse fine air space disease.   Original Report Authenticated By: Genevive Bi, M.D.    Dg Chest Port 1 View  11/12/2012  *RADIOLOGY REPORT*  Clinical Data: ET tube position  PORTABLE CHEST - 1 VIEW  Comparison: 11/11/2012  Findings: Endotracheal tube terminates 4 cm above the carina.  Stable interstitial/airspace opacities bilaterally, favoring interstitial edema, less likely multifocal infection. No pleural effusion or pneumothorax.  Stable mild cardiomegaly.  Left IJ venous catheter with the tip in the mid SVC.  Enteric tube has been withdrawn and now terminates in the mid/upper esophagus.  IMPRESSION: Endotracheal tube terminates 4 cm above the carina.  Stable interstitial/airspace opacities bilaterally, favoring interstitial edema, less likely multifocal infection.  Enteric tube has been withdrawn and now terminates in the mid/upper esophagus.  Advancement is suggested.   Original Report Authenticated By: Charline Bills, M.D.    Dg Chest Port 1 View  11/11/2012  *RADIOLOGY REPORT*  Clinical Data:  Respiratory difficulty  PORTABLE CHEST - 1 VIEW  Comparison: Yesterday  Findings: Endotracheal tube, NG tube, left internal jugular central venous catheters stable.  Hazy diffuse bilateral airspace disease stable.  Upper normal heart size.  No pneumothorax.  IMPRESSION: Stable bilateral airspace disease.   Original Report Authenticated By: Jolaine Click, M.D.    Dg Chest Port 1 View  11/10/2012  *RADIOLOGY REPORT*  Clinical Data: Evaluate endotracheal tube, pulmonary edema  PORTABLE CHEST - 1 VIEW  Comparison: 11/09/2012; 11/07/2012; 11/06/2012; chest CT - 11/06/2012  Findings:  Grossly unchanged borderline enlarged cardiac silhouette and mediastinal contours without grade calcifications of the aortic arch.  Stable position of support apparatus.  No pneumothorax. Minimally improved aeration of the lungs with persistent bilateral heterogeneous opacities.  Pulmonary vasculature remains indistinct. No definite pleural effusion.  Unchanged bones.  IMPRESSION: 1.  Stable positioning of support apparatus.  No pneumothorax. 2.  Minimally improved aeration of the lungs with persistent bilateral air space opacities with differential considerations including pulmonary edema and/or multifocal infection.   Original Report Authenticated By: Tacey Ruiz, MD    Dg Chest Port 1 View  11/09/2012  *RADIOLOGY REPORT*  Clinical Data: Evaluate endotracheal tube placement.  PORTABLE CHEST - 1 VIEW  Comparison: Chest x-ray 11/08/2012.  Findings: An endotracheal tube is in place with tip 3.2 cm above the carina. There is a left-sided internal jugular central venous catheter with tip terminating in the distal superior vena cava. A nasogastric tube is seen extending into the stomach, however, the tip of the nasogastric tube extends below the lower margin of the image.  Lung  volumes are low. There is cephalization of the pulmonary vasculature, indistinctness of the interstitial markings, and patchy airspace disease throughout the lungs  bilaterally suggestive of moderate pulmonary edema. Trace bilateral pleural effusions.  Mild cardiomegaly is unchanged. Mediastinal contours are unremarkable allowing for patient positioning.  Atherosclerosis in the thoracic aorta.  IMPRESSION: 1.  Support apparatus, as above. 2.  Persistent cephalization of the pulmonary vasculature and indistinct interstitial markings with patchy air space disease bilaterally, favored to represent pulmonary edema.  Superimposed multifocal infection is not excluded. 3.  Atherosclerosis.   Original Report Authenticated By: Trudie Reed, M.D.    Dg Chest Port 1 View  11/08/2012  *RADIOLOGY REPORT*  Clinical Data: Endotracheal tube placement.  PORTABLE CHEST - 1 VIEW  Comparison: 11/07/2012.  Findings: Endotracheal tube tip at the level of the carina directed towards the right mainstem bronchus.  Recommend retracting by 3 cm.  Left central line tip mid to distal superior vena cava level.  Nasogastric tube courses below the diaphragm.  The tip is not included on this exam.  Diffuse air space disease.  This may represent diffuse pulmonary edema.  Infectious infiltrate not entirely excluded.  No gross pneumothorax.  Cardiomegaly.  Calcified aorta.  IMPRESSION: Endotracheal tube tip at the level of the carina directed towards the right mainstem bronchus.  Recommend retracting by 3 cm.  Left central line tip mid to distal superior vena cava level.  Nasogastric tube courses below the diaphragm.  The tip is not included on this exam.  Diffuse air space disease.  This may represent diffuse pulmonary edema.  Infectious infiltrate not entirely excluded.  No gross pneumothorax.  Cardiomegaly.  Critical Value/emergent results were called by telephone at the time of interpretation on 11/08/2012 at 11:38 a.m. to [facility] patients nurse, who verbally acknowledged these results.   Original Report Authenticated By: Lacy Duverney, M.D.    Dg Chest Port 1 View  11/07/2012  *RADIOLOGY REPORT*   Clinical Data: Dyspnea.  Increased oxygen requirement.  PORTABLE CHEST - 1 VIEW  Comparison: 11/06/2012  Findings: Shallow inspiration.  Mild cardiac enlargement.  Diffuse bilateral parenchymal infiltrates consistent with pneumonia or edema.  No pleural effusions.  No pneumothorax.  No significant change since previous study.  IMPRESSION: Stable appearance of diffuse bilateral pulmonary infiltrates.   Original Report Authenticated By: Burman Nieves, M.D.    Dg Chest Port 1 View  11/06/2012  *RADIOLOGY REPORT*  Clinical Data: Postop.  Question pneumonia.  PORTABLE CHEST - 1 VIEW  Comparison: 10/31/2012  Findings: There are low lung volumes.  Diffuse interstitial prominence of the lungs.  Somewhat confluent opacity noted in the right upper lobe.  Mild cardiomegaly.  No effusions.  No acute bony abnormality.  IMPRESSION: Diffuse interstitial prominence with more focal opacity in the right upper lobe.  This could represent edema or infection. Mild cardiomegaly.   Original Report Authenticated By: Charlett Nose, M.D.    Dg Hip Portable 1 View Right  11/03/2012  *RADIOLOGY REPORT*  Clinical Data: Osteoarthritis of the right hip.  Status post total hip replacement.  PORTABLE RIGHT HIP - 1 VIEW  Comparison: Radiographs dated 11/03/2012  Findings: The acetabular and femoral components appear in good position in the lateral projection.  No fractures.  IMPRESSION: Satisfactory appearance of the right total hip prosthesis in the lateral projection.   Original Report Authenticated By: Francene Boyers, M.D.    Dg C-arm 1-60 Min-no Report  11/03/2012  CLINICAL DATA: Surgery   C-ARM 1-60 MINUTES  Fluoroscopy was utilized  by the requesting physician.  No radiographic  interpretation.      Disposition: to acute inpatient rehab      Discharge Orders    Future Orders Please Complete By Expires   Discharge patient        She will need to continue a prednisone taper, levaquin, and regular respiratory care/treatments  while in rehab as she continues pulmonary toilet and recovering from ARDS.    SignedKathryne Hitch 11/16/2012, 1:45 PM

## 2012-11-16 NOTE — Consult Note (Signed)
Physical Medicine and Rehabilitation Consult Reason for Consult: Endstage DJD right hip Referring Physician:  Dr. Magnus Ivan   HPI: [name] [name] is a 71 y.o. female with history of lung disease, HTN, right hip pain due to endstage DJD and failure of conservative therapy. Patient elected to undergo R-THR on 11/03/12 by Dr. Magnus Ivan. Post op course complicated by hypoxia due to acute CHF and was diuresed per Dr. Ludwig Clarks input.  Cardiac echo done with normal EF. Patient with developed acute respiratory distress requiring intubation on 12/10. She was started on IV antibiotics for suspected HCAP and pressors for hypotensive shock. She has had problems with agitation and self extubated on 11/13/12.  Hypoxia slowly improving and CCM feels that patient will need oxygen at discharge. Cultures all negative. Slow rise in WBC seen but no signs of infection--monitor for now per Dr. Vassie Loll.  Therapies resumed and patient is WBAT with anterior hip precautions. PT, OT , MD recommending CIR for progression.   Review of Systems  HENT: Negative for hearing loss and neck pain.   Eyes: Negative for blurred vision and double vision.  Respiratory: Negative for cough, shortness of breath and wheezing.   Cardiovascular: Positive for palpitations. Negative for chest pain.  Gastrointestinal: Negative for heartburn and nausea.       Thrush  Musculoskeletal: Negative for myalgias and back pain.  Neurological: Positive for weakness. Negative for headaches.   Past Medical History  Diagnosis Date  . Hypertension   . Arthritis     a. 10/2012 s/p Right THA.  Marland Kitchen Hyperlipidemia   . Pulmonary nodule     a. 10/2012 CT: 13mm anterior LUL nodule w/ spiculated appearance - rec PET CT.   Past Surgical History  Procedure Date  . Tonsillectomy   . Total hip arthroplasty 11/03/2012    Procedure: TOTAL HIP ARTHROPLASTY ANTERIOR APPROACH;  Surgeon: Kathryne Hitch, MD;  Location: [facility];  Service: Orthopedics;  Laterality:  Right;  Right Total Hip Arthroplasty, Anterior Approach (C-Arm)  . Tubal ligation    Family History  Problem Relation Age of Onset  . CAD Father    Social History:  Lives alone. Works as a Psychologist, forensic. She reports that she has been smoking Cigarettes--1/2 to 1 PPD.  She has a 40 pack-year smoking history. She does not have any smokeless tobacco history on file. She reports that she drinks alcohol--2 drinks/night. She reports that she does not use illicit drugs.   Allergies  Allergen Reactions  . Penicillins Hives and Swelling   Medications Prior to Admission  Medication Sig Dispense Refill  . amLODipine-benazepril (LOTREL) 5-20 MG per capsule Take 1 capsule by mouth every morning.      Marland Kitchen atorvastatin (LIPITOR) 20 MG tablet Take 20 mg by mouth daily before breakfast.       . carboxymethylcellulose (REFRESH TEARS) 0.5 % SOLN Place 1 drop into both eyes daily as needed. For dry eyes      . Coenzyme Q10 (CO Q 10) 100 MG CAPS Take 300 mg by mouth at bedtime.      Marland Kitchen etodolac (LODINE) 400 MG tablet Take 400 mg by mouth 2 (two) times daily.      . hydrochlorothiazide (HYDRODIURIL) 25 MG tablet Take 25 mg by mouth every morning.      . Multiple Vitamin (MULTIVITAMIN WITH MINERALS) TABS Take 1 tablet by mouth daily.        Home: Home Living Lives With: Alone Available Help at Discharge: Family Type of Home: House Home  Access: Stairs to enter Entrance Stairs-Rails: None Home Layout: Two level;1/2 bath on main level Alternate Level Stairs-Number of Steps: 8+8 Alternate Level Stairs-Rails: None Bathroom Shower/Tub: Health visitor: Standard Home Adaptive Equipment: Environmental consultant - rolling;Raised toilet seat with rails Additional Comments: shower is upstairs; will sponge bathe initially  Functional History: Prior Function Able to Take Stairs?: Yes Driving: Yes Comments: will have help for a week Functional Status:  Mobility: Bed Mobility Bed Mobility: Supine to Sit Supine  to Sit: 3: Mod assist;HOB elevated;With rails Sitting - Scoot to Edge of Bed: 3: Mod assist Sit to Supine: 4: Min assist Transfers Transfers: Stand Pivot Transfers Sit to Stand: 4: Min assist;3: Mod assist (min from recliner; mod from 3:1) Sit to Stand: Patient Percentage: 60% Stand to Sit: 4: Min guard;To bed Stand Pivot Transfers: 1: +2 Total assist Stand Pivot Transfers: Patient Percentage: 60% Ambulation/Gait Ambulation/Gait Assistance: 4: Min assist Ambulation Distance (Feet): 15 Feet (x2 ) Assistive device: Rolling walker Ambulation/Gait Assistance Details: Min cues for posture, positon inside RW. Gait Pattern: Step-to pattern;Decreased stance time - right Gait velocity: decreased Stairs: No Stairs Assistance: 4: Min assist Stairs Assistance Details (indicate cue type and reason): Cues for sequencing/technique with RW and educated daughter on how to assist pt.  Stair Management Technique: No rails;Step to pattern;Backwards;Forwards;With walker Number of Stairs: 2     ADL: ADL Grooming: Performed;Min guard;Wash/dry hands;Teeth care Where Assessed - Grooming: Supported standing Upper Body Bathing: Minimal assistance Where Assessed - Upper Body Bathing: Unsupported sitting Lower Body Bathing: Maximal assistance Where Assessed - Lower Body Bathing: Supported sit to stand Upper Body Dressing: Minimal assistance Where Assessed - Upper Body Dressing: Unsupported sitting Lower Body Dressing: Maximal assistance Where Assessed - Lower Body Dressing: Supported sit to stand Toilet Transfer: Performed;Minimal assistance (mod a for sit to stand from 3:1 commode; min cues) Toilet Transfer Method: Sit to stand Toilet Transfer Equipment: Raised toilet seat with arms (or 3-in-1 over toilet) Equipment Used: Rolling walker Transfers/Ambulation Related to ADLs: ambulated to bathroom; on 2 liters 02 ADL Comments: Pt a little shaky during above adls.  Pt a little shaky, needing min guard  A.  Cognition: Cognition Arousal/Alertness: Awake/alert Orientation Level: Oriented X4 Cognition Overall Cognitive Status: Appears within functional limits for tasks assessed/performed Area of Impairment: Attention Arousal/Alertness: Awake/alert Orientation Level: Appears intact for tasks assessed Behavior During Session: [facility] for tasks performed Current Attention Level: Sustained  Blood pressure 143/71, pulse 63, temperature 98.8 F (37.1 C), temperature source Oral, resp. rate 18, height 5\' 2"  (1.575 m), weight 54.432 kg (120 lb), SpO2 96.00%. Physical Exam  Nursing note and vitals reviewed. Constitutional: She is oriented to person, place, and time. She appears well-developed and well-nourished.  HENT:  Head: Normocephalic and atraumatic.  Right Ear: External ear normal.  Left Ear: External ear normal.  Eyes: Conjunctivae normal and EOM are normal. Pupils are equal, round, and reactive to light.  Neck: Normal range of motion. Neck supple. No JVD present. No tracheal deviation present. No thyromegaly present.  Cardiovascular: Normal rate, regular rhythm and normal heart sounds.   Pulmonary/Chest: Effort normal and breath sounds normal. No respiratory distress. She has no wheezes.       Wearing oxygen [place]  Abdominal: Soft. Bowel sounds are normal. She exhibits no distension. There is no tenderness.  Musculoskeletal: She exhibits no edema.       Mepilex on right hip . Hip appropriately tender.  Neurological: She is alert and oriented to person, place, and time.  Right hip limited by pain. No sensory issues. Cognitively appropriate  Skin: Skin is warm and dry.  Psychiatric: Her speech is normal and behavior is normal. Judgment and thought content normal. Her mood appears anxious.    Results for orders placed during the hospital encounter of 11/03/12 (from the past 24 hour(s))  GLUCOSE, CAPILLARY     Status: Normal   Collection Time   11/15/12  8:09 AM      Component Value  Range   Glucose-Capillary 95  70 - 99 mg/dL   Comment 1 Notify RN    GLUCOSE, CAPILLARY     Status: Abnormal   Collection Time   11/15/12 11:50 AM      Component Value Range   Glucose-Capillary 157 (*) 70 - 99 mg/dL   Comment 1 Notify RN    GLUCOSE, CAPILLARY     Status: Abnormal   Collection Time   11/15/12  4:32 PM      Component Value Range   Glucose-Capillary 119 (*) 70 - 99 mg/dL   Comment 1 Notify RN    GLUCOSE, CAPILLARY     Status: Abnormal   Collection Time   11/15/12  7:58 PM      Component Value Range   Glucose-Capillary 137 (*) 70 - 99 mg/dL  GLUCOSE, CAPILLARY     Status: Abnormal   Collection Time   11/16/12 12:19 AM      Component Value Range   Glucose-Capillary 104 (*) 70 - 99 mg/dL  GLUCOSE, CAPILLARY     Status: Normal   Collection Time   11/16/12  4:14 AM      Component Value Range   Glucose-Capillary 98  70 - 99 mg/dL  CBC     Status: Abnormal   Collection Time   11/16/12  5:55 AM      Component Value Range   WBC 19.6 (*) 4.0 - 10.5 K/uL   RBC 3.58 (*) 3.87 - 5.11 MIL/uL   Hemoglobin 11.1 (*) 12.0 - 15.0 g/dL   HCT 16.1 (*) 09.6 - 04.5 %   MCV 95.0  78.0 - 100.0 fL   MCH 31.0  26.0 - 34.0 pg   MCHC 32.6  30.0 - 36.0 g/dL   RDW 40.9  81.1 - 91.4 %   Platelets 513 (*) 150 - 400 K/uL  BASIC METABOLIC PANEL     Status: Abnormal   Collection Time   11/16/12  5:55 AM      Component Value Range   Sodium 140  135 - 145 mEq/L   Potassium 4.0  3.5 - 5.1 mEq/L   Chloride 102  96 - 112 mEq/L   CO2 25  19 - 32 mEq/L   Glucose, Bld 91  70 - 99 mg/dL   BUN 23  6 - 23 mg/dL   Creatinine, Ser 7.82  0.50 - 1.10 mg/dL   Calcium 9.4  8.4 - 95.6 mg/dL   GFR calc non Af Amer 86 (*) >90 mL/min   GFR calc Af Amer >90  >90 mL/min   Dg Chest Port 1 View  11/15/2012  *RADIOLOGY REPORT*  Clinical Data: Pulmonary edema  PORTABLE CHEST - 1 VIEW  Comparison: 11/14/2012; 11/12/2012; 11/06/2012; chest CT - 11/06/2012  Findings: Grossly unchanged cardiac silhouette and  mediastinal contours given persistently reduced lung volumes. Atherosclerotic calcifications within the thoracic and abdominal aorta.  Interval removal of left jugular approach central venous catheter.  Grossly unchanged bilateral heterogeneous perihilar predominant airspace opacities.  No new  focal airspace opacities.  No definite pleural effusion or pneumothorax.  Unchanged bones.  IMPRESSION: Grossly unchanged extensive bilateral airspace opacities, again, worrisome for pulmonary edema, though atypical infection may have a similar appearance.   Original Report Authenticated By: Tacey Ruiz, MD    Dg Chest Port 1 View  11/14/2012  *RADIOLOGY REPORT*  Clinical Data: Hypertension, rales  PORTABLE CHEST - 1 VIEW  Comparison: 11/13/2012  Findings: The heart size remains normal.  No change in left IJ catheter.  The endotracheal and nasogastric tubes have been removed.  Mild interstitial prominence persists bilaterally without focal changes.  No effusions or pneumothoraces.  IMPRESSION: Interstitial prominence persists.  No new findings.  The endotracheal and nasogastric tubes have been removed.   Original Report Authenticated By: Sander Radon, M.D.     Assessment/Plan: Diagnosis: OA of right hip s/p THA, complicated by CHF/respiratory failure 1. Does the need for close, 24 hr/day medical supervision in concert with the patient's rehab needs make it unreasonable for this patient to be served in a less intensive setting? Yes 2. Co-Morbidities requiring supervision/potential complications: hypokalemia, sob 3. Due to bladder management, bowel management, safety, skin/wound care, disease management, medication administration, pain management and patient education, does the patient require 24 hr/day rehab nursing? Yes 4. Does the patient require coordinated care of a physician, rehab nurse, PT (1-2 hrs/day, 5 days/week) and OT (1-2 hrs/day, 5 days/week) to address physical and functional deficits in the  context of the above medical diagnosis(es)? Yes Addressing deficits in the following areas: balance, endurance, locomotion, strength, transferring, bowel/bladder control, bathing, dressing, feeding, grooming, toileting and psychosocial support 5. Can the patient actively participate in an intensive therapy program of at least 3 hrs of therapy per day at least 5 days per week? Yes 6. The potential for patient to make measurable gains while on inpatient rehab is excellent 7. Anticipated functional outcomes upon discharge from inpatient rehab are mod I with PT, mod I with OT, n/a with SLP. 8. Estimated rehab length of stay to reach the above functional goals is: one week 9. Does the patient have adequate social supports to accommodate these discharge functional goals? Yes 10. Anticipated D/C setting: Home 11. Anticipated post D/C treatments: HH therapy 12. Overall Rehab/Functional Prognosis: excellent  RECOMMENDATIONS: This patient's condition is appropriate for continued rehabilitative care in the following setting: CIR Patient has agreed to participate in recommended program. Yes Note that insurance prior authorization may be required for reimbursement for recommended care.  Comment: Pt needs to navigate steps within the home. Is still deconditioned from a pulmonary standpoint. Needs to be independent once home  Ivory Broad, MD  11/16/2012

## 2012-11-16 NOTE — PMR Pre-admission (Signed)
PMR Admission Coordinator Pre-Admission Assessment  Patient: [name] [name] is an 71 y.o., female MRN: [id number] DOB: 08-12-1941 Height: 5\' 2"  (157.5 cm) Weight: 54.432 kg (120 lb)              Insurance Information PRIMARY: Medicare     Policy#: [id number]      Subscriber: self   Employer: [employer]   Name: [employer]. Date: 02/29/08     Deduct: $1184      Out of Pocket Max: none      Life Max: none CIR: 100%      SNF: 100 days Outpatient: 80%     Co-Pay: 20% Home Health: 100%      Co-Pay: 0 DME: 80%     Co-Pay: 20% Providers: Patient's choice SECONDARY: BCBS F      Policy#: [id number]      Subscriber: self CM Name:       Phone#:      Fax#:  Employer: [employer] Information    Name Relation Home Work Mobile   Felishia, Wartman [phone]   Aubrielle, [place] Daughter [phone]  [phone]     Current Medical History  Patient Admitting Diagnosis: OA of right hip s/p THA, complicated by CHF/respiratory failure  History of Present Illness: 71 y.o. female with history of lung disease, HTN, right hip pain due to endstage DJD and failure of conservative therapy. Patient elected to undergo R-THR on 11/03/12 by Dr. Magnus Ivan. Post op course complicated by hypoxia due to acute CHF and was diuresed per Dr. Ludwig Clarks input. Cardiac echo done with normal EF. Patient with developed acute respiratory distress requiring intubation on 12/10. She was started on IV antibiotics for suspected HCAP and pressors for hypotensive shock. She has had problems with agitation and self extubated on 11/13/12.  Hypoxia slowly improving and CCM feels that patient will need oxygen at discharge. Cultures all negative. Slow rise in WBC seen but no signs of infection--monitor for now per Dr. Vassie Loll. Therapies resumed and patient is WBAT with anterior hip precautions.     Past Medical History  Past Medical History   Diagnosis Date  . Hypertension   . Arthritis     a. 10/2012 s/p Right THA.  Marland Kitchen Hyperlipidemia   . Pulmonary nodule     a. 10/2012 CT: 13mm anterior LUL nodule w/ spiculated appearance - rec PET CT.   Family History  family history includes CAD in her father.  Prior Rehab/Hospitalizations: none  Current Medications  Current facility-administered medications:0.9 %  sodium chloride infusion, , Intravenous, Continuous, Kathryne Hitch, MD, Last Rate: 20 mL/hr at 11/13/12 0130;  acetaminophen-codeine (TYLENOL #3) 300-30 MG per tablet 1 tablet, 1 tablet, Oral, Q6H PRN, Oretha Milch, MD, 1 tablet at 11/15/12 1200;  albuterol (PROVENTIL) (5 MG/ML) 0.5% nebulizer solution 2.5 mg, 2.5 mg, Nebulization, Q6H PRN, Oretha Milch, MD, 2.5 mg at 11/16/12 3295 alum & mag hydroxide-simeth (MAALOX/MYLANTA) 200-200-20 MG/5ML suspension 30 mL, 30 mL, Oral, Q4H PRN, Kathryne Hitch, MD;  amLODipine (NORVASC) tablet 5 mg, 5 mg, Oral, Daily, Kathryne Hitch, MD, 5 mg at 11/16/12 [id number];  antiseptic oral rinse (BIOTENE) solution 15 mL, 15 mL, Mouth Rinse, QID, Vilinda Blanks Minor, NP, 15 mL at 11/16/12 1200 chlorhexidine (PERIDEX) 0.12 % solution 15 mL, 15 mL, Mouth Rinse, BID, William S Minor, NP, 15 mL at 11/16/12 0948;  docusate (COLACE) 50 MG/5ML liquid 100 mg, 100 mg, Oral, BID,  Gwen Her, PHARMD, 100 mg at 11/16/12 0948;  enoxaparin (LOVENOX) injection 30 mg, 30 mg, Subcutaneous, Q12H, Kathryne Hitch, MD, 30 mg at 11/16/12 0531 ferrous sulfate tablet 325 mg, 325 mg, Oral, TID WC, Gwen Her, PHARMD, 325 mg at 11/16/12 1248;  folic acid (FOLVITE) tablet 1 mg, 1 mg, Oral, Daily, Cammy Copa, MD, 1 mg at 11/16/12 0948;  furosemide (LASIX) tablet 20 mg, 20 mg, Oral, Daily, Vilinda Blanks Minor, NP, 20 mg at 11/16/12 0948;  insulin aspart (novoLOG) injection 2-6 Units, 2-6 Units, Subcutaneous, Q4H, Oretha Milch, MD, 2 Units at 11/16/12 0955 ipratropium (ATROVENT) nebulizer  solution 0.5 mg, 0.5 mg, Nebulization, Q6H, Oretha Milch, MD, 0.5 mg at 11/16/12 1408;  methocarbamol (ROBAXIN) 500 mg in dextrose 5 % 50 mL IVPB, 500 mg, Intravenous, Q6H PRN, Kathryne Hitch, MD, 500 mg at 11/03/12 1509;  methocarbamol (ROBAXIN) tablet 500 mg, 500 mg, Oral, Q6H PRN, Kathryne Hitch, MD, 500 mg at 11/14/12 1813 multivitamin with minerals tablet 1 tablet, 1 tablet, Oral, Daily, Gwen Her, PHARMD, 1 tablet at 11/16/12 0948;  nystatin (MYCOSTATIN) 100000 UNIT/ML suspension 500,000 Units, 5 mL, Oral, TID, Oretha Milch, MD, 500,000 Units at 11/16/12 1123;  polyvinyl alcohol (LIQUIFILM TEARS) 1.4 % ophthalmic solution 1 drop, 1 drop, Both Eyes, PRN, Kathryne Hitch, MD;  predniSONE (DELTASONE) tablet 10 mg, 10 mg, Oral, Once, Vilinda Blanks Minor, NP thiamine (B-1) injection 100 mg, 100 mg, Intravenous, Daily, Cammy Copa, MD, 100 mg at 11/13/12 1610;  thiamine (VITAMIN B-1) tablet 100 mg, 100 mg, Oral, Daily, Cammy Copa, MD, 100 mg at 11/16/12 9604  Patients Current Diet: General  Precautions / Restrictions Precautions Precautions: None Precaution Comments: monitor O2. Restrictions Weight Bearing Restrictions: Yes RLE Weight Bearing: Weight bearing as tolerated   Prior Activity Level : Active daily   Home Assistive Devices / Equipment Home Assistive Devices/Equipment: None Home Adaptive Equipment: Walker - rolling;Raised toilet seat with rails  Prior Functional Level Prior Function Level of Independence: Independent Able to Take Stairs?: Yes Driving: Yes Vocation: Full time employment Comments: will have help for a week  Current Functional Level Cognition  Arousal/Alertness: Awake/alert Overall Cognitive Status: Appears within functional limits for tasks assessed/performed Current Attention Level: Sustained Orientation Level: Oriented X4    Extremity Assessment (includes Sensation/Coordination)  RUE ROM/Strength/Tone: WFL for  tasks assessed  RLE ROM/Strength/Tone: Deficits RLE ROM/Strength/Tone Deficits: hip flexion 3+, knee ext. 4 RLE Sensation: WFL - Light Touch    ADLs  Grooming: Performed;Min guard;Wash/dry hands;Teeth care Where Assessed - Grooming: Supported standing Upper Body Bathing: Minimal assistance Where Assessed - Upper Body Bathing: Unsupported sitting Lower Body Bathing: Maximal assistance Where Assessed - Lower Body Bathing: Supported sit to stand Upper Body Dressing: Minimal assistance Where Assessed - Upper Body Dressing: Unsupported sitting Lower Body Dressing: Maximal assistance Where Assessed - Lower Body Dressing: Supported sit to stand Toilet Transfer: Performed;Minimal assistance (mod a for sit to stand from 3:1 commode; min cues) Toilet Transfer Method: Sit to Barista: Raised toilet seat with arms (or 3-in-1 over toilet) Toileting - Clothing Manipulation and Hygiene: Performed;Minimal assistance Where Assessed - Engineer, mining and Hygiene: Standing Equipment Used: Rolling walker Transfers/Ambulation Related to ADLs: ambulated to bathroom; on 2 liters 02 ADL Comments: Pt a little shaky during above adls.  Pt a little shaky, needing min guard A.    Mobility  Bed Mobility: Supine to Sit Supine to Sit: 5: Supervision Sitting - Scoot to  Edge of Bed: 4: Min guard Sit to Supine: 4: Min assist    Transfers  Transfers: Stand Pivot Transfers Sit to Stand: 4: Min guard;From bed;With upper extremity assist Sit to Stand: Patient Percentage: 60% Stand to Sit: 4: Min guard;To chair/3-in-1;With upper extremity assist Stand Pivot Transfers: 1: +2 Total assist Stand Pivot Transfers: Patient Percentage: 60%    Ambulation / Gait / Stairs / Wheelchair Mobility  Ambulation/Gait Ambulation/Gait Assistance: 4: Min guard;5: Supervision Ambulation Distance (Feet): 68 Feet Assistive device: Rolling walker Ambulation/Gait Assistance Details: Min cues for  posture, positon inside RW. Gait Pattern: Step-to pattern;Decreased stance time - right Gait velocity: decreased General Gait Details: increased time Stairs: No Stairs Assistance: 4: Min assist Stairs Assistance Details (indicate cue type and reason): Cues for sequencing/technique with RW and educated daughter on how to assist pt.  Stair Management Technique: No rails;Step to pattern;Backwards;Forwards;With walker Number of Stairs: 2     Posture / Balance Static Sitting Balance Static Sitting - Balance Support: Feet supported;No upper extremity supported Static Sitting - Level of Assistance: 5: Stand by assistance    Special needs/care consideration BiPAP/CPAP - N/A CPM - N/A Continuous Drip IV yes Dialysis - N/A Life Vest - N/A Oxygen yes Special Bed - N/A Trach Size - N/A Wound Vac (area) - N/A      Skin: Hip incision                              Location: R hip Bowel mgmt:LBM 12/17 Bladder mgmt:WNL Diabetic mgmt: N/A   Previous Home Environment Living Arrangements: Alone Lives With: Alone Available Help at Discharge: Family Type of Home: House Home Layout: Two level;1/2 bath on main level Alternate Level Stairs-Rails: None Alternate Level Stairs-Number of Steps: 8+8 Home Access: Stairs to enter Entrance Stairs-Rails: None Bathroom Shower/Tub: Health visitor: Standard Home Care Services: No Additional Comments: shower is upstairs; will sponge bathe initially  Discharge Living Setting Plans for Discharge Living Setting: Patient's home;Alone Type of Home at Discharge: House Discharge Home Layout: Two level Alternate Level Stairs-Rails: None Alternate Level Stairs-Number of Steps: 8+8 Discharge Home Access: Stairs to enter Entrance Stairs-Rails: None Entrance Stairs-Number of Steps: 4 Discharge Bathroom Shower/Tub: Walk-in shower Discharge Bathroom Toilet: Standard Do you have any problems obtaining your medications?: No  Social/Family/Support  Systems Patient Roles: Parent; Scientist, product/process development Information: [phone] Anticipated Caregiver: Pt's son or daughter Anticipated Caregiver's Contact Information: Bayleigh Loflin: [phone], NFAOZHY QMVHQION: [phone] Caregiver Availability: Intermittent Discharge Plan Discussed with Primary Caregiver: No (Goals are mod I) Does Caregiver/Family have Issues with Lodging/Transportation while Pt is in Rehab?: No  Goals/Additional Needs Patient/Family Goal for Rehab: Mod I goals Expected length of stay: 1 week Cultural Considerations: none Dietary Needs: regular Equipment Needs: TBD Pt/Family Agrees to Admission and willing to participate: Yes Program Orientation Provided & Reviewed with Pt/Caregiver Including Roles  & Responsibilities: Yes  Decrease burden of Care through IP rehab admission: N/A  Possible need for SNF placement upon discharge: Not planned  Patient Condition: This patient's condition remains as documented in the Consult dated 11/16/12, in which the Rehabilitation Physician determined and documented that the patient's condition is appropriate for intensive rehabilitative care in an inpatient rehabilitation facility.  Preadmission Screen Completed By:  Meryl Dare, 11/16/2012 2:10 PM ______________________________________________________________________   Discussed status with Dr. Riley Kill on 11/16/12 at 2:30 PM and received telephone approval for admission today.  Admission Coordinator:  Meryl Dare, time 2:30PM/Date12/18/13

## 2012-11-16 NOTE — Progress Notes (Signed)
Patient to be discharged to inpatient rehab at [facility].  Patient will be going to room 4031.  RN called [facility] to arrange transport after giving report to [facility] on the Rehab unit at [facility].  As per Dr. Magnus Ivan, he wants patient to be on exactly all of the medications she is on at [facility].  Dr. Magnus Ivan would like the patient to continue Levaquin po for ten days.  Patient did receive Levaquin dose this evening.  Maryanne RN at [facility] center is aware that patient is to be on the EXACT same medications as she was here at [facility].  IV removed from left hand.  Patient's family without questions at this time.  Patient agreeable to discharge.

## 2012-11-16 NOTE — Progress Notes (Signed)
Physical Therapy Treatment Patient Details Name: [name] [name] MRN: [id number] DOB: 01-08-1941 Today's Date: 11/16/2012 Time: 4098-1191 PT Time Calculation (min): 25 min  PT Assessment / Plan / Recommendation Comments on Treatment Session  pt improving, progressing well    Follow Up Recommendations  CIR     Does the patient have the potential to tolerate intense rehabilitation     Barriers to Discharge        Equipment Recommendations  Rolling walker with 5" wheels (petite)    Recommendations for Other Services Rehab consult  Frequency 7X/week   Plan Discharge plan remains appropriate;Frequency remains appropriate    Precautions / Restrictions Precautions Precautions: None Precaution Comments: monitor O2. Restrictions RLE Weight Bearing: Weight bearing as tolerated   Pertinent Vitals/Pain O2 sats 92-95% on 2L throughout HR 60-90    Mobility  Bed Mobility Bed Mobility: Supine to Sit Supine to Sit: 5: Supervision Sitting - Scoot to Edge of Bed: 4: Min guard Details for Bed Mobility Assistance: cues for scooting laterally in supine, increased time Transfers Sit to Stand: 4: Min guard;From bed;With upper extremity assist Stand to Sit: 4: Min guard;To chair/3-in-1;With upper extremity assist Details for Transfer Assistance: min cues for hand placement Ambulation/Gait Ambulation/Gait Assistance: 4: Min guard;5: Supervision Ambulation Distance (Feet): 68 Feet Assistive device: Rolling walker Ambulation/Gait Assistance Details: Min cues for posture, positon inside RW. Gait Pattern: Step-to pattern;Decreased stance time - right General Gait Details: increased time    Exercises     PT Diagnosis:    PT Problem List:   PT Treatment Interventions:     PT Goals Acute Rehab PT Goals Time For Goal Achievement: 11/28/12 Potential to Achieve Goals: Good Pt will go Supine/Side to Sit: with supervision PT Goal: Supine/Side to Sit - Progress: Progressing toward goal Pt  will go Sit to Stand: with supervision PT Goal: Sit to Stand - Progress: Progressing toward goal Pt will Ambulate: 51 - 150 feet;with supervision;with rolling walker PT Goal: Ambulate - Progress: Progressing toward goal  Visit Information  Last PT Received On: 11/16/12 Assistance Needed: +1    Subjective Data  Subjective: I am tired of everyone talking and judging my smoking   Cognition  Overall Cognitive Status: Appears within functional limits for tasks assessed/performed Arousal/Alertness: Awake/alert Orientation Level: Appears intact for tasks assessed Behavior During Session: [facility] for tasks performed    Balance     End of Session PT - End of Session Equipment Utilized During Treatment: Oxygen Activity Tolerance: Patient tolerated treatment well Patient left: in chair;with call bell/phone within reach   GP     [facility] 11/16/2012, 9:22 AM

## 2012-11-16 NOTE — Progress Notes (Signed)
PULMONARY  / CRITICAL CARE MEDICINE  Name: [name] [name] MRN: [id number] DOB: 1941-09-12    LOS: 5  REFERRING MD :  TRH  CHIEF COMPLAINT:  Acute respiratory distress  BRIEF PATIENT DESCRIPTION: 71 yo smoker without history of lung disease s/p elective total hip arthroplasty on 12/5, developed hypoxia on 12/8.  CT angio was negative for PE but showed bilateral asymmetric ground glass opacities. BNP 1378 - diuresed, but developed progressive hypoxia. PCCM consulted on 12/9, BiPAP started. TTE - nml LVEF, no WMA. Intubated 12-10  LINES / TUBES: 12-10 7.5 ott 23 @teeth >> 11/13/2012 self extubated  12-10 L i j cvl 20 cm>>12-16 12-10 aline>>out  CULTURES: 12-10 sputum>>> normal flora Urine 12/10>>>NTD as of 12/15/1 Sputum 12/10>>>normal flora   No results found for this basename: PROCALCITON:5 in the last 168 hours   ANTIBIOTICS: Aztreonam 12/8 >>>11/13/12 Vancomycin 12/8 >>>11/12/12 Levaquin 12/8 >>>12-18 Mycelex 12-16 >>     SIGNIFICANT EVENTS:  12/5  Elective total hip arthroplasty. 12/8  Developed hypoxia, abx began, diuresed. 12/9  TTE - LVF wnl.  Progressive hypoxia, NRB, BiPAP. 12-10 intubation 12-11 Changed to APRV ventilation 11/12/12: Meets sBT criteria (currently on APRV) but CXR shows diffuse ALI still and gets very agitated on WUA (neednig high doses of versed gtt) 12-15 self extubated 12-17 tx to floor  SUBJECTIVE/OVERNIGHT/INTERVAL HX Low grade temp 12/17 - afebrile today denies pain  VITAL SIGNS: BP 143/71  Pulse 63  Temp 98.8 F (37.1 C) (Oral)  Resp 18  Ht 5\' 2"  (1.575 m)  Wt 54.432 kg (120 lb)  BMI 21.95 kg/m2  SpO2 96%  INTAKE / OUTPUT:       Intake/Output      12/17 0701 - 12/18 0700 12/18 0701 - 12/19 0700   P.O. 600    I.V. (mL/kg)     Total Intake(mL/kg) 600 (11)    Urine (mL/kg/hr) 1500 (1.1)    Total Output 1500    Net -900         Stool Occurrence 1 x        Filed Weights   11/13/12 0431 11/15/12 0400 11/16/12 0600   Weight: 62.2 kg (137 lb 2 oz) 56.6 kg (124 lb 12.5 oz) 54.432 kg (120 lb)     PHYSICAL EXAMINATION: General: Awake and interactive Neuro:  intact. HEENT:  PERRL, ++thrush Cardiovascular:  Irregular rhythm, no murmurs Lungs:  Bilateral diminished air entry,  rales in bases but decreased Abdomen:  Soft, non tender, ++bs Musculoskeletal:  Moves all extremities, no edema, rt hip wound intact Skin:  Intact  LABS:  Lab 11/16/12 0555 11/15/12 0345 11/14/12 0815  NA 140 139 139  K 4.0 3.9 3.6  CL 102 101 95*  CO2 25 30 35*  BUN 23 24* 29*  CREATININE 0.70 0.70 0.77  GLUCOSE 91 108* 133*    Lab 11/16/12 0555 11/15/12 0345 11/14/12 0500  HGB 11.1* 10.1* 10.2*  HCT 34.0* 30.5* 30.2*  WBC 19.6* 18.6* 17.6*  PLT 513* 420* 444*   ABG    Component Value Date/Time   PHART 7.473* 11/12/2012 1830   PCO2ART 53.4* 11/12/2012 1830   PO2ART 144.0* 11/12/2012 1830   HCO3 38.6* 11/12/2012 1830   TCO2 35.3 11/12/2012 1830   ACIDBASEDEF 6.4* 11/11/2012 1752   O2SAT 99.5 11/12/2012 1830   IMAGING: Dg Chest Port 1 View  11/15/2012  *RADIOLOGY REPORT*  Clinical Data: Pulmonary edema  PORTABLE CHEST - 1 VIEW  Comparison: 11/14/2012; 11/12/2012; 11/06/2012; chest CT - 11/06/2012  Findings:  Grossly unchanged cardiac silhouette and mediastinal contours given persistently reduced lung volumes. Atherosclerotic calcifications within the thoracic and abdominal aorta.  Interval removal of left jugular approach central venous catheter.  Grossly unchanged bilateral heterogeneous perihilar predominant airspace opacities.  No new focal airspace opacities.  No definite pleural effusion or pneumothorax.  Unchanged bones.  IMPRESSION: Grossly unchanged extensive bilateral airspace opacities, again, worrisome for pulmonary edema, though atypical infection may have a similar appearance.   Original Report Authenticated By: Tacey Ruiz, MD    Dg Chest Port 1 View  11/14/2012  *RADIOLOGY REPORT*  Clinical Data:  Hypertension, rales  PORTABLE CHEST - 1 VIEW  Comparison: 11/13/2012  Findings: The heart size remains normal.  No change in left IJ catheter.  The endotracheal and nasogastric tubes have been removed.  Mild interstitial prominence persists bilaterally without focal changes.  No effusions or pneumothoraces.  IMPRESSION: Interstitial prominence persists.  No new findings.  The endotracheal and nasogastric tubes have been removed.   Original Report Authenticated By: Sander Radon, M.D.    ECG: 12/8 >>> LVH, NSR 2D: The cavity size was normal. Wall thickness was increased in a pattern of mild LVH. Systolic function was vigorous. The estimated ejection fraction was in the range of 65% to 70%.  DIAGNOSES: Principal Problem:  *Hypoxemia Active Problems:  Degenerative arthritis of hip  Confusion  Fever  S/P total hip arthroplasty  CHF (congestive heart failure)  HCAP (healthcare-associated pneumonia)  Acute respiratory failure  Normocytic anemia  Hypokalemia  Acute diastolic heart failure  ASSESSMENT / PLAN:  PULMONARY  A: Acute respiratory failure, hypoxemic (DDx:  ARDS, Pneumonitis, HCAP, Pulmonary edema, NSIP, RB-ILD, HP).  Self extubated P:   - change to po pred- rapid taper over 3ds to off -will need PFTs as out pt & FU with LB PULM  to decide need for BDs, meanwhile ct albuterol/atrovent prn only -Reassess need for O2 on discharge -Smoking cessation emphasized    CARDIOVASCULAR  Lab 11/13/12 0430  PROBNP 2486.0*    A: Shock resolved Echo - LVH, nml LVEF  Intake/Output Summary (Last 24 hours) at 11/16/12 0907 Last data filed at 11/16/12 0218  Gross per 24 hour  Intake    600 ml  Output   1500 ml  Net   -900 ml    P:  -  lasix  changed to po daily. as to not over diurese.  - Monitor lytes    RENAL  Lab 11/16/12 0555 11/15/12 0345 11/14/12 0815 11/13/12 1800 11/13/12 0430 11/12/12 0440 11/11/12 0455 11/10/12 0405  NA 140 139 139 137 139 -- -- --  K 4.0 3.9  -- -- -- -- -- --  CL 102 101 95* 93* 95* -- -- --  CO2 25 30 35* 36* 37* -- -- --  GLUCOSE 91 108* 133* 123* 133* -- -- --  BUN 23 24* 29* 29* 33* -- -- --  CREATININE 0.70 0.70 0.77 0.80 0.69 -- -- --  CALCIUM 9.4 9.2 9.6 9.3 9.0 -- -- --  MG -- 1.9 -- -- 2.1 1.9 2.5 2.5  PHOS -- 3.1 -- -- 2.5 4.5 2.2* 2.7    A:  Hypokalemia   Lab 11/16/12 0555 11/15/12 0345 11/14/12 0815  K 4.0 3.9 3.6     P:   -replete as needed    GASTROINTESTINAL  A:  No active issues. No overt need for swallow evaluation. P:   - eating  INFECTIOUS  No results found for this basename: PROCALCITON:5  in the last 168 hours  A:  Suspected HCAP.  PCN allergy. 12-16 + thrush Leucocytosis . P:   - Antibiotics as above  - Continue levaquin x 10ds total - can stop 12/18 -nystatin swish 7 swalllow for thrush x 3 ds -follow WC    NEURO A:  ICU delirium - resolved  P Dc'd sedation gtt Dc'd haldol Thiamine and folic acid to continue   Son  extensively  updated at bedside.  Will need SNF vs rehab  & O2 evaluation on dc    Cyril Mourning MD. FCCP. [facility] Pager [phone] If no response call [phone]    [name] Pallas V.

## 2012-11-17 ENCOUNTER — Inpatient Hospital Stay (HOSPITAL_COMMUNITY): Payer: Medicare Other

## 2012-11-17 ENCOUNTER — Inpatient Hospital Stay (HOSPITAL_COMMUNITY): Payer: Medicare Other | Admitting: Occupational Therapy

## 2012-11-17 ENCOUNTER — Inpatient Hospital Stay (HOSPITAL_COMMUNITY): Payer: Medicare Other | Admitting: *Deleted

## 2012-11-17 DIAGNOSIS — Z96649 Presence of unspecified artificial hip joint: Secondary | ICD-10-CM

## 2012-11-17 DIAGNOSIS — I509 Heart failure, unspecified: Secondary | ICD-10-CM

## 2012-11-17 DIAGNOSIS — M1610 Unilateral primary osteoarthritis, unspecified hip: Secondary | ICD-10-CM

## 2012-11-17 LAB — CBC WITH DIFFERENTIAL/PLATELET
Eosinophils Relative: 3 % (ref 0–5)
HCT: 35.3 % — ABNORMAL LOW (ref 36.0–46.0)
Lymphocytes Relative: 10 % — ABNORMAL LOW (ref 12–46)
Lymphs Abs: 2 10*3/uL (ref 0.7–4.0)
MCV: 94.1 fL (ref 78.0–100.0)
Platelets: 554 10*3/uL — ABNORMAL HIGH (ref 150–400)
RBC: 3.75 MIL/uL — ABNORMAL LOW (ref 3.87–5.11)
WBC: 20.8 10*3/uL — ABNORMAL HIGH (ref 4.0–10.5)

## 2012-11-17 LAB — COMPREHENSIVE METABOLIC PANEL
ALT: 23 U/L (ref 0–35)
Alkaline Phosphatase: 73 U/L (ref 39–117)
CO2: 27 mEq/L (ref 19–32)
Calcium: 9.6 mg/dL (ref 8.4–10.5)
GFR calc Af Amer: 85 mL/min — ABNORMAL LOW (ref >90)
GFR calc non Af Amer: 74 mL/min — ABNORMAL LOW (ref >90)
Glucose, Bld: 113 mg/dL — ABNORMAL HIGH (ref 70–99)
Potassium: 3.8 mEq/L (ref 3.5–5.1)
Sodium: 141 mEq/L (ref 135–145)

## 2012-11-17 LAB — ANCA SCREEN W REFLEX TITER
Atypical p-ANCA Screen: NEGATIVE
c-ANCA Screen: NEGATIVE
p-ANCA Screen: NEGATIVE

## 2012-11-17 LAB — ANTI-DNA ANTIBODY, DOUBLE-STRANDED: ds DNA Ab: 2 IU/mL (ref <30)

## 2012-11-17 LAB — RHEUMATOID FACTOR: Rhuematoid fact SerPl-aCnc: 37 IU/mL — ABNORMAL HIGH (ref <=14)

## 2012-11-17 NOTE — Progress Notes (Signed)
INITIAL NUTRITION ASSESSMENT  DOCUMENTATION CODES Per approved criteria  -Not Applicable   INTERVENTION: Continue current diet.  NUTRITION DIAGNOSIS: None at this time.  Goal: Intake of >75% meals.  Monitor:  Weight trend, intake, labs  Reason for Assessment: Nutrition Risk  71 y.o. female  Admitting Dx: s/p R-THR with complications including hypoxia due to acute CHF, Intubated and required TF at that time.  ASSESSMENT: Pt states she is enjoying the meals very well and is eating very well.  Restricting a little because she wants to lose the 10# that she gained.  Upon eval of medical record patient is below usual body weight and would encourage no weight loss.  Encouraged healthy eating without restriction.  If weight in accurate, patient has had a 14# weight loss in the past 3 week.  Will monitor weight trend.  Height: Ht Readings from Last 1 Encounters:  11/16/12 5\' 2"  (1.575 m)    Weight: Wt Readings from Last 1 Encounters:  11/16/12 113 lb 5.1 oz (51.4 kg)    Ideal Body Weight: 50 kg  % Ideal Body Weight: 103  Wt Readings from Last 10 Encounters:  11/16/12 113 lb 5.1 oz (51.4 kg)  11/16/12 120 lb (54.432 kg)  11/16/12 120 lb (54.432 kg)  10/31/12 130 lb (58.968 kg)    Usual Body Weight: 130#.  Patient states she was happy to lose to 120#.  % Usual Body Weight: 87  BMI:  Body mass index is 20.73 kg/(m^2).  Estimated Nutritional Needs: Kcal: 1400-1700 Protein: 55-70 gm Fluid: 1.4-1.7L  Skin: wound C/D/I  Diet Order: General  EDUCATION NEEDS: -Education needs addressed  Intake/Output Summary (Last 24 hours) at 11/17/12 1259 Last data filed at 11/17/12 0859  Gross per 24 hour  Intake    240 ml  Output      0 ml  Net    240 ml    Labs:   Lab 11/17/12 0605 11/16/12 0555 11/15/12 0345 11/13/12 0430 11/12/12 0440  NA 141 140 139 -- --  K 3.8 4.0 3.9 -- --  CL 103 102 101 -- --  CO2 27 25 30  -- --  BUN 26* 23 24* -- --  CREATININE 0.80 0.70  0.70 -- --  CALCIUM 9.6 9.4 9.2 -- --  MG -- -- 1.9 2.1 1.9  PHOS -- -- 3.1 2.5 4.5  GLUCOSE 113* 91 108* -- --    CBG (last 3)   Basename 11/16/12 1606 11/16/12 1146 11/16/12 0842  GLUCAP 124* 95 134*    Scheduled Meds:   . amLODipine  5 mg Oral Daily  . antiseptic oral rinse  15 mL Mouth Rinse QID  . docusate  100 mg Oral BID  . enoxaparin (LOVENOX) injection  40 mg Subcutaneous Daily  . ferrous sulfate  325 mg Oral TID WC  . folic acid  1 mg Oral Daily  . furosemide  20 mg Oral Daily  . multivitamin with minerals  1 tablet Oral Daily  . nystatin  5 mL Oral TID    Continuous Infusions:   Past Medical History  Diagnosis Date  . Hypertension   . Arthritis     a. 10/2012 s/p Right THA.  Marland Kitchen Hyperlipidemia   . Pulmonary nodule     a. 10/2012 CT: 13mm anterior LUL nodule w/ spiculated appearance - rec PET CT.    Past Surgical History  Procedure Date  . Tonsillectomy   . Total hip arthroplasty 11/03/2012    Procedure: TOTAL HIP ARTHROPLASTY  ANTERIOR APPROACH;  Surgeon: Kathryne Hitch, MD;  Location: [facility] ORS;  Service: Orthopedics;  Laterality: Right;  Right Total Hip Arthroplasty, Anterior Approach (C-Arm)  . Tubal ligation     Oran Rein, RD, LDN Clinical Inpatient Dietitian Pager:  [phone] Weekend and after hours pager:  [phone]

## 2012-11-17 NOTE — Interval H&P Note (Signed)
[name] [name] was admitted today to Inpatient Rehabilitation with the diagnosis of right hip OA/Right THA with post-op CHF.  The patient's history has been reviewed, patient examined, and there is no change in status.  Patient continues to be appropriate for intensive inpatient rehabilitation.  I have reviewed the patient's chart and labs.  Questions were answered to the patient's satisfaction.   THIS H&P WAS COMPLETED ON 11/16/12 AND FOR THE PURPOSES OF CHARTING WAS MOVED TO THE REHAB CHART TODAY (THE PT WAS NOT ADDED TO REHAB CENSUS UNTIL LATE LAST NIGHT)  Muaaz Brau T 11/17/2012, 6:49 AM

## 2012-11-17 NOTE — Care Management Note (Signed)
Inpatient Rehabilitation Center Individual Statement of Services  Patient Name:  Jawanda Passey  Date:  11/17/2012  Welcome to the Inpatient Rehabilitation Center.  Our goal is to provide you with an individualized program based on your diagnosis and situation, designed to meet your specific needs.  With this comprehensive rehabilitation program, you will be expected to participate in at least 3 hours of rehabilitation therapies Monday-Friday, with modified therapy programming on the weekends.  Your rehabilitation program will include the following services:  Physical Therapy (PT), Occupational Therapy (OT), 24 hour per day rehabilitation nursing, Case Management (RN and Social Worker), Rehabilitation Medicine, Nutrition Services and Pharmacy Services  Weekly team conferences will be held on Wednesday to discuss your progress.  Your RN Case Designer, television/film set will talk with you frequently to get your input and to update you on team discussions.  Team conferences with you and your family in attendance may also be held.  Expected length of stay: 5 days Overall anticipated outcome: mod/i-supervision level  Depending on your progress and recovery, your program may change.  Your RN Case Estate agent will coordinate services and will keep you informed of any changes.  Your RN Sports coach and SW names and contact numbers are listed  below.  The following services may also be recommended but are not provided by the Inpatient Rehabilitation Center:   Driving Evaluations  Home Health Rehabiltiation Services  Outpatient Rehabilitatation [facility]  Vocational Rehabilitation   Arrangements will be made to provide these services after discharge if needed.  Arrangements include referral to agencies that provide these services.  Your insurance has been verified to be:  Medicare Your primary doctor is:  Dr Melba Coon  Pertinent information will be shared with your doctor and  your insurance company.   Social Worker:  Dossie Der, [place] [phone]  Information discussed with and copy given to patient by: Lucy Chris, 11/17/2012, 11:46 AM

## 2012-11-17 NOTE — Progress Notes (Signed)
Patient information reviewed and entered into eRehab system by Tora Duck, RN, CRRN, PPS Coordinator.  Information including medical coding and functional independence measure will be reviewed and updated through discharge.     Per nursing patient was given "Data Collection Information Summary for Patients in Inpatient Rehabilitation Facilities with attached "Privacy Act Statement-Health Care Records" upon admission.  Patient was a late admit and information was reviewed again by Oletha Cruel, RN this morning to ensure patient understood.

## 2012-11-17 NOTE — Progress Notes (Signed)
Social Work Patient ID: [name] [name], female   DOB: Apr 20, 1941, 71 y.o.   MRN: [id number] Met with pt and spoke with team who feel she will be ready for discharge Monday 12/23.  Pt is agreeable and feels comfortable with this plan. MD and PA aware and are agreeable.  Will set up follow up home health and DME.  Pt does have BCBS secondary will get information from her tomorrow.

## 2012-11-17 NOTE — Progress Notes (Signed)
Physical Therapy Session Note  Patient Details  Name: [name] [name] MRN: [id number] Date of Birth: [birth year]  Today's Date: 11/17/2012 Time: 1345-1430 Time Calculation (min): 45 min  Short Term Goals: Week 1:  PT Short Term Goal 1 (Week 1): STGs=LTGs due to short ELOS PT Short Term Goal 2 (Week 1): Patient will recall 100% anterior hip precautions without verbal cues.  Skilled Therapeutic Interventions/Progress Updates:    Patient received sitting in wheelchair. RN administered pain medication. Patient able to recall 100% of anterior hip precautions. Discussion with patient about home set-up and discharge. Patient to d/c to daughter's home for 1-2 days, which has a level entry. She will then go to her home, which has 2 STE without handrails. Patient reports her son is a Product manager and plans to install one handrail this weekend. Continued discussion about various options for negotiating 2 steps into home with and without a handrail.  Demonstration of negotiating two steps with rolling walker, ascending backwards and descending forwards, both directions with rolling walker positioned behind patient. Patient negotiated one step with this method with min assist, but is very anxious/fearful when doing so and states that she does not want to negotiate steps this way. Patient descending one step with rolling walker and returned to sitting in wheelchair. Demonstration of negotiation of steps ascending and descending laterally with use of one handrail. Patient instructed to ascend leading with her L LE and descend leading with her R LE. Patient negotiated 4 steps laterally with min assist.  Patient instructed in gait training x 157' with rolling walker and supervision. Patient assisted into bed and transfers sit>supine with supervision. Patient left supine in bed with bed alarm on and all needs within reach.  Therapy Documentation Precautions:  Precautions Precautions: Anterior  Hip;Fall Precaution Booklet Issued: Yes (comment) Restrictions Weight Bearing Restrictions: Yes RLE Weight Bearing: Weight bearing as tolerated Other Position/Activity Restrictions: WBAT R LE Vital Signs: Therapy Vitals Pulse Rate: 76  Patient Position, if appropriate: Sitting Oxygen Therapy SpO2: 95 % O2 Device: None (Room air) Pulse Oximetry Type: Intermittent Pain: Pain Assessment Pain Assessment: 0-10 Pain Score:   6 Pain Type: Acute pain Pain Location: Hip Pain Orientation: Right Pain Descriptors: Aching;Sore Pain Frequency: Occasional Pain Onset: Gradual Patients Stated Pain Goal: 2 Pain Intervention(s): Therapeutic touch;Ambulation/increased activity;Repositioned  See FIM for current functional status  Therapy/Group: Individual Therapy  Chipper Herb. [name], PT, DPT  11/17/2012, 3:55 PM

## 2012-11-17 NOTE — Progress Notes (Signed)
Social Work Assessment and Plan Social Work Assessment and Plan  Patient Details  Name: [name] [name] MRN: [id number] Date of Birth: June 05, 1941  Today's Date: 11/17/2012  Problem List:  Patient Active Problem List  Diagnosis  . Degenerative arthritis of hip  . Hypoxemia  . SOB (shortness of breath)  . Confusion  . Fever  . S/P total hip arthroplasty  . CHF (congestive heart failure)  . HCAP (healthcare-associated pneumonia)  . Acute respiratory failure  . Normocytic anemia  . Hypokalemia  . Acute diastolic heart failure  . Hypotension   Past Medical History:  Past Medical History  Diagnosis Date  . Hypertension   . Arthritis     a. 10/2012 s/p Right THA.  Marland Kitchen Hyperlipidemia   . Pulmonary nodule     a. 10/2012 CT: 13mm anterior LUL nodule w/ spiculated appearance - rec PET CT.   Past Surgical History:  Past Surgical History  Procedure Date  . Tonsillectomy   . Total hip arthroplasty 11/03/2012    Procedure: TOTAL HIP ARTHROPLASTY ANTERIOR APPROACH;  Surgeon: Kathryne Hitch, MD;  Location: [facility];  Service: Orthopedics;  Laterality: Right;  Right Total Hip Arthroplasty, Anterior Approach (C-Arm)  . Tubal ligation    Social History:  reports that she has been smoking Cigarettes.  She has a 40 pack-year smoking history. She does not have any smokeless tobacco history on file. She reports that she drinks alcohol. She reports that she does not use illicit drugs.  Family / Support Systems Marital Status: Divorced Patient Roles: Parent;Other (Comment) (Employee) Children: Katelen Luepke  [phone]  [phone] Other Supports: Charlett Nose  [phone] Anticipated Caregiver: Son and daughter and their children to assist pt at home Ability/Limitations of Caregiver: Only have intermittent assist Caregiver Availability: Intermittent Family Dynamics: Close knit family who are there for one another and assist when needed.  Pt reports they will make sure she  has what she needs.  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  She is moving around her room and feesl she will not have to be here long.  She is motivated to do well and return home. Recent Psychosocial Issues: Other medical issues Pyschiatric History: No history-deferred depression screen due to pt feels doing well with her hip surgery. Substance Abuse History: Tobacco-1/2-1 pack a day aware needs to quit.  Socially drinks doesn't feel it is a problem  Patient / Family Perceptions, Expectations & Goals Pt/Family understanding of illness & functional limitations: Pt is able to explain her hip surgery and recovery from.  She reports she had to be different and was not as simple as they thought with her CHF.  She is glad to be doing mcuh better now. Premorbid pt/family roles/activities: Mother, Grandmother, Employee, Church member, Chief Financial Officer, etc Anticipated changes in roles/activities/participation: resume Pt/family expectations/goals: Pt states: " I want to go home soon but want to be ready also."  She is pleased with how well she is doing now.  She states: " The team feesl  I can go home before Christmas."  [employer]: None Premorbid Home Care/DME Agencies: None Transportation available at discharge: [employer] referrals recommended:  Support group (specify) (CHF Support group)  Discharge Planning Living Arrangements: Alone Support Systems: Children;Other relatives;Friends/neighbors;Church/faith community Type of Residence: Private residence Insurance Resources: [employer] Financial Resources: Employment Financial Screen Referred: No Living Expenses: Own Money Management: Patient Do you have any problems obtaining your medications?: No Home Management: Patient Patient/Family Preliminary Plans: Return home with family coming in and out.  She feels if she can get independent with her walker she will be fine at home.  She has no concerns about going home. Confident her family will make sure her needs are met. Social Work Anticipated Follow Up Needs: HH/OP DC Planning Additional Notes/Comments: High level short length of stay.  Clinical Impression Pleasant female who is motivated to improve and get back home.  Family is supportive and will do whatever pt needs.  Short length of stay due to pt's high level.  Lucy Chris 11/17/2012, 12:31 PM

## 2012-11-17 NOTE — Progress Notes (Signed)
Requested to restart patient on levaquin for next 10 days  . P. Love PA reported not at this time per pumonilogist. Continue with plan of care .               [name] [name]

## 2012-11-17 NOTE — Progress Notes (Signed)
Occupational Therapy Session Note  Patient Details  Name: [name] [name] MRN: [id number] Date of Birth: 05-29-1941  Today's Date: 11/17/2012 Time: 1100-1130 Time Calculation (min): 30 min  Short Term Goals: No short term goals set  Skilled Therapeutic Interventions/Progress Updates:  Pt seen for functional mobility training with ADLs for shower stall transfers and kitchen mobility. Pt was supervision with all tasks. Educated patient on safe approaches to reaching for items below waist level, general kitchen safety and walker management tips, and energy conservation strategies for housekeeping and laundry.   Therapy Documentation Precautions:  Precautions Precautions: Anterior Hip;Fall Precaution Booklet Issued: Yes (comment) Restrictions Weight Bearing Restrictions: Yes RLE Weight Bearing: Weight bearing as tolerated    Vital Signs: Therapy Vitals Pulse Rate: 110  Oxygen Therapy SpO2: 96 % O2 Device: None (Room air) Pulse Oximetry Type: Intermittent Pain: Pain Assessment Pain Assessment: No/denies pain Pain Score: 0-No pain ADL:  See FIM for current functional status  Therapy/Group: Individual Therapy  Shaila Gilchrest 11/17/2012, 11:40 AM

## 2012-11-17 NOTE — Evaluation (Signed)
Occupational Therapy Assessment and Plan  Patient Details  Name: [name] [name] MRN: [id number] Date of Birth: 05-18-1941  OT Diagnosis: muscle weakness (generalized) Rehab Potential: Rehab Potential: Excellent ELOS: 5-7 days   Today's Date: 11/17/2012 Time: 7829-5621 Time Calculation (min): 60 min  Problem List:  Patient Active Problem List  Diagnosis  . Degenerative arthritis of hip  . Hypoxemia  . SOB (shortness of breath)  . Confusion  . Fever  . S/P total hip arthroplasty  . CHF (congestive heart failure)  . HCAP (healthcare-associated pneumonia)  . Acute respiratory failure  . Normocytic anemia  . Hypokalemia  . Acute diastolic heart failure  . Hypotension    Past Medical History:  Past Medical History  Diagnosis Date  . Hypertension   . Arthritis     a. 10/2012 s/p Right THA.  Marland Kitchen Hyperlipidemia   . Pulmonary nodule     a. 10/2012 CT: 13mm anterior LUL nodule w/ spiculated appearance - rec PET CT.   Past Surgical History:  Past Surgical History  Procedure Date  . Tonsillectomy   . Total hip arthroplasty 11/03/2012    Procedure: TOTAL HIP ARTHROPLASTY ANTERIOR APPROACH;  Surgeon: Kathryne Hitch, MD;  Location: [facility];  Service: Orthopedics;  Laterality: Right;  Right Total Hip Arthroplasty, Anterior Approach (C-Arm)  . Tubal ligation     Assessment & Plan Clinical Impression: Patient is a 70 y.o. year old female with recent admission to the hospital with history of lung disease, HTN, right hip pain due to endstage DJD and failure of conservative therapy. Patient elected to undergo R-THR on 11/03/12 by Dr. Magnus Ivan. Post op course complicated by hypoxia due to acute CHF and was diuresed per Dr. Ludwig Clarks input. Cardiac echo done with normal EF. Patient with developed acute respiratory distress requiring intubation on 12/10. She was started on IV antibiotics for suspected HCAP and pressors for hypotensive shock. Patient transferred to CIR on 11/16/2012 .     Patient currently requires supervision with basic self-care skills secondary to muscle weakness.  Prior to hospitalization, patient could complete BADL and IADL with Independence.  Patient will benefit from skilled intervention to increase independence with basic self-care skills prior to discharge home independently.  Anticipate patient will require no supervision and no further OT follow recommended.  OT - End of Session Endurance Deficit: Yes OT Assessment Rehab Potential: Excellent Barriers to Discharge: None OT Plan OT Intensity: Minimum of 1-2 x/day, 45 to 90 minutes OT Frequency: 5 out of 7 days OT Duration/Estimated Length of Stay: 5-7 days OT Treatment/Interventions: Balance/vestibular training;Community reintegration;Discharge planning;Functional mobility training;DME/adaptive equipment instruction;Patient/family education;Self Care/advanced ADL retraining;Therapeutic Activities;Therapeutic Exercise OT Recommendation Follow Up Recommendations: None Equipment Recommended: Tub/shower seat  OT Evaluation Precautions/Restrictions  Precautions Precautions: None Restrictions Weight Bearing Restrictions: Yes RLE Weight Bearing: Weight bearing as tolerated Pain Pain Assessment Pain Assessment: No/denies pain Home Living/Prior Functioning Home Living Lives With: Alone Available Help at Discharge: Family;Available 24 hours/day Type of Home: House Home Access: Stairs to enter [employer] of Steps: 2 Entrance Stairs-Rails: None Home Layout: Two level;1/2 bath on main level;Bed/bath upstairs Alternate Level Stairs-Number of Steps: 8 then a landing then 8 steps again Alternate Level Stairs-Rails: None Bathroom Shower/Tub: Walk-in shower;Door Bathroom Toilet: Handicapped height Home Adaptive Equipment: Grab bars in shower;Grab bars around toilet;Raised toilet seat with rails Additional Comments: Upstairs is handicapped height. Downstairs is standard toilet with   raised toilet seat with handles.  Prior Function Level of Independence: Independent with basic ADLs;Independent with transfers;Independent with  gait Able to Take Stairs?: Yes Driving: Yes Vocation: Full time employment Vocation Requirements: Librarian, academic Vision/Perception  Vision - History Baseline Vision: Wears glasses all the time Patient Visual Report: No change from baseline  Cognition Overall Cognitive Status: Appears within functional limits for tasks assessed Arousal/Alertness: Awake/alert Orientation Level: Oriented X4 Sensation Sensation Light Touch: Appears Intact Stereognosis: Appears Intact Hot/Cold: Appears Intact Proprioception: Appears Intact Coordination Gross Motor Movements are Fluid and Coordinated: Yes Fine Motor Movements are Fluid and Coordinated: Yes Mobility  Transfers Sit to Stand: 5: Supervision;With upper extremity assist Stand to Sit: 5: Supervision;With upper extremity assist  Extremity/Trunk Assessment RUE Assessment RUE Assessment: Within Functional Limits (MMT: 4/5) LUE Assessment LUE Assessment: Within Functional Limits (MMT: 4/5)  See FIM for current functional status Refer to Care Plan for Long Term Goals  Recommendations for other services: None  Discharge Criteria: Patient will be discharged from OT if patient refuses treatment 3 consecutive times without medical reason, if treatment goals not met, if there is a change in medical status, if patient makes no progress towards goals or if patient is discharged from hospital.  The above assessment, treatment plan, treatment alternatives and goals were discussed and mutually agreed upon: by patient  OT eval completed this date. Patient will benefit from OT services to increase ADL performance, functional transfers, Bil UE strength and endurance and DME education. ADL completed in walk-in shower this AM. Session with focus on safety awareness, functional mobility with walker, bathing,  dressing, energy conservation, and standing balance. Patient required verbal cues for hand placement and technique during transfers and sit to stands. Patient overall presenting at a high level of function and I do not anticipate a long length of stay.  Limmie Patricia, OTR/L 11/17/2012, 9:36 AM

## 2012-11-17 NOTE — Plan of Care (Signed)
Overall Plan of Care [facility]) Patient Details Name: [name] [name] MRN: [id number] DOB: Dec 20, 1940  Diagnosis:  Rehab for deconditioning  Primary Diagnosis:    Deconditioning from post op respiratory failure Co-morbidities: Recent THA for endstage OA,HTN  Functional Problem List  Patient demonstrates impairments in the following areas: Balance, Bladder, Bowel, Edema, Endurance, Medication Management, Motor, Pain, Safety and Skin Integrity  Basic ADL's: grooming, bathing, dressing and toileting Advanced ADL's: simple meal preparation and light housekeeping  Transfers:  bed mobility, bed to chair, toilet, tub/shower, car, furniture and floor Locomotion:  ambulation and stairs  Additional Impairments:  None  Anticipated Outcomes Item Anticipated Outcome  Eating/Swallowing    Basic self-care  Mod I  Tolieting    Mod I  Bowel/Bladder   continent of bowel /bladder  Transfers  Toilet and shower: Mod I  Locomotion  Mod I with RW, 2 stairs no handrails with RW mod I  Communication    Cognition    Pain  Pain less or equal to 2  Safety/Judgment    Other     Therapy Plan: PT Intensity: Minimum of 1-2 x/day ,45 to 90 minutes PT Frequency: 5 out of 7 days PT Duration Estimated Length of Stay: 5 days PT Treatment/Interventions: Ambulation/gait training;Balance/vestibular training;Discharge planning;DME/adaptive equipment instruction;Functional mobility training;Neuromuscular re-education;Pain management;Patient/family education;Psychosocial support;Therapeutic Exercise;Therapeutic Activities;Stair training;UE/LE Strength taining/ROM;UE/LE Coordination activities;Wheelchair propulsion/positioning OT Intensity: Minimum of 1-2 x/day, 45 to 90 minutes OT Frequency: 5 out of 7 days OT Duration/Estimated Length of Stay: 5-7 days OT Treatment/Interventions: Metallurgist training;Community reintegration;Discharge planning;Functional mobility training;DME/adaptive equipment  instruction;Patient/family education;Self Care/advanced ADL retraining;Therapeutic Activities;Therapeutic Exercise        Team Interventions: Item RN PT OT SLP SW TR Other  Self Care/Advanced ADL Retraining   x      Neuromuscular Re-Education x        Therapeutic Activities  x x      UE/LE Strength Training/ROM  x x      UE/LE Coordination Activities  x x      Visual/Perceptual Remediation/Compensation         DME/Adaptive Equipment Instruction  x x      Therapeutic Exercise  x x      Balance/Vestibular Training  x x      Patient/Family Education x x x      Cognitive Remediation/Compensation         Functional Mobility Training  x x      Ambulation/Gait Training  x       Furniture conservator/restorer Reintegration  x       Dysphagia/Aspiration Film/video editor         Bladder Management x        Bowel Management x        Disease Management/Prevention         Pain Management x x       Medication Management         Skin Care/Wound Management         Splinting/Orthotics         Discharge Planning x x x      Psychosocial Support x x                          Team  Discharge Planning: Destination:  Home Projected Follow-up:  PT and Home Health Projected Equipment Needs:  Walker Patient/family involved in discharge planning:  No family available  MD ELOS: 5-7 d Medical Rehab Prognosis:  Excellent Assessment: 71 yo female underwent elective THA for OA developed post op respiratory failure requiring intubation now requiring 24 / 7 rehab RN/MD, CIR level PT OT

## 2012-11-17 NOTE — Progress Notes (Signed)
Patient ID: [name] [name], female   DOB: 02-06-1941, 71 y.o.   MRN: [id number] 71 y.o. female with history of lung disease, HTN, right hip pain due to endstage DJD and failure of conservative therapy. Patient elected to undergo R-THR on 11/03/12 by Dr. Magnus Ivan. Post op course complicated by hypoxia due to acute CHF and was diuresed per Dr. Ludwig Clarks input. Cardiac echo done with normal EF. Patient with developed acute respiratory distress requiring intubation on 12/10. She was started on IV antibiotics for suspected HCAP and pressors for hypotensive shock. She has had problems with agitation and self extubated on 11/13/12.  Subjective/Complaints: Slept poorly but no sig pain Review of Systems  Constitutional: Positive for malaise/fatigue.  Respiratory: Negative for cough and shortness of breath.   Neurological: Negative for focal weakness.  All other systems reviewed and are negative.   Objective: Vital Signs: Blood pressure 144/83, pulse 108, temperature 98.1 F (36.7 C), temperature source Oral, resp. rate 17, height 5\' 2"  (1.575 m), weight 51.4 kg (113 lb 5.1 oz), SpO2 90.00%. No results found. Results for orders placed during the hospital encounter of 11/16/12 (from the past 72 hour(s))  MRSA PCR SCREENING     Status: Normal   Collection Time   11/16/12  9:21 PM      Component Value Range Comment   MRSA by PCR NEGATIVE  NEGATIVE   CBC WITH DIFFERENTIAL     Status: Abnormal   Collection Time   11/17/12  6:05 AM      Component Value Range Comment   WBC 20.8 (*) 4.0 - 10.5 K/uL    RBC 3.75 (*) 3.87 - 5.11 MIL/uL    Hemoglobin 11.8 (*) 12.0 - 15.0 g/dL    HCT 40.9 (*) 81.1 - 46.0 %    MCV 94.1  78.0 - 100.0 fL    MCH 31.5  26.0 - 34.0 pg    MCHC 33.4  30.0 - 36.0 g/dL    RDW 91.4  78.2 - 95.6 %    Platelets 554 (*) 150 - 400 K/uL    Neutrophils Relative 81 (*) 43 - 77 %    Neutro Abs 16.9 (*) 1.7 - 7.7 K/uL    Lymphocytes Relative 10 (*) 12 - 46 %    Lymphs Abs 2.0  0.7 - 4.0  K/uL    Monocytes Relative 7  3 - 12 %    Monocytes Absolute 1.4 (*) 0.1 - 1.0 K/uL    Eosinophils Relative 3  0 - 5 %    Eosinophils Absolute 0.5  0.0 - 0.7 K/uL    Basophils Relative 0  0 - 1 %    Basophils Absolute 0.0  0.0 - 0.1 K/uL   COMPREHENSIVE METABOLIC PANEL     Status: Abnormal   Collection Time   11/17/12  6:05 AM      Component Value Range Comment   Sodium 141  135 - 145 mEq/L    Potassium 3.8  3.5 - 5.1 mEq/L    Chloride 103  96 - 112 mEq/L    CO2 27  19 - 32 mEq/L    Glucose, Bld 113 (*) 70 - 99 mg/dL    BUN 26 (*) 6 - 23 mg/dL    Creatinine, Ser 2.13  0.50 - 1.10 mg/dL    Calcium 9.6  8.4 - 08.6 mg/dL    Total Protein 6.3  6.0 - 8.3 g/dL    Albumin 3.1 (*) 3.5 - 5.2 g/dL    AST  25  0 - 37 U/L    ALT 23  0 - 35 U/L    Alkaline Phosphatase 73  39 - 117 U/L    Total Bilirubin 0.7  0.3 - 1.2 mg/dL    GFR calc non Af Amer 74 (*) >90 mL/min    GFR calc Af Amer 85 (*) >90 mL/min      HEENT: normal Cardio: RRR Resp: CTA B/L GI: BS positive Extremity:  No Edema Skin:   Wound C/D/I Neuro: Alert/Oriented, Cranial Nerve II-XII normal and Normal Sensory Musc/Skel:  Other hip prec Gen NAD   Assessment/Plan: 1. Functional deficits secondary to Acute respiratory failure due to post op pneumonia which require 3+ hours per day of interdisciplinary therapy in a comprehensive inpatient rehab setting. Physiatrist is providing close team supervision and 24 hour management of active medical problems listed below. Physiatrist and rehab team continue to assess barriers to discharge/monitor patient progress toward functional and medical goals. FIM:                                  Medical Problem List and Plan:  1. DVT Prophylaxis/Anticoagulation: Pharmaceutical: Lovenox  2. Pain Management: tylenol #3 used on prn basis with good results.  3. Mood: No signs of distress noted. Will have LCSW follow for formal evaluation  4. Neuropsych: This patient is  capable of making decisions on his/her own behalf.  5. ABLA: Continue iron supplement tid.  6. Suspected HCAP: completed 10 day course of Levaquin on 12/18. Monitor reoccurence 7. Thrush: nystatin mouth wash added.  8. Pulmonary edema: Continue lasix daily for now.  9. Acute respiratory failure: prn nebs. Will need follow up with [facility] for PFTs and decision regarding bronchodilators on outpatient basis. Emphasize tobacco cessation.      LOS (Days) 1 A FACE TO FACE EVALUATION WAS PERFORMED  Daaron Dimarco E 11/17/2012, 8:25 AM

## 2012-11-17 NOTE — H&P (View-Only) (Signed)
Physical Medicine and Rehabilitation Admission H&P    CC: Endstage DJD right hip.  HPI: [name] [name] is a 71 y.o. female with history of lung disease, HTN, right hip pain due to endstage DJD and failure of conservative therapy. Patient elected to undergo R-THR on 11/03/12 by Dr. Blackman. Post op course complicated by hypoxia due to acute CHF and was diuresed per Dr. Crenshaw's input. Cardiac echo done with normal EF. Patient with developed acute respiratory distress requiring intubation on 12/10. She was started on IV antibiotics for suspected HCAP and pressors for hypotensive shock. She has had problems with agitation and self extubated on 11/13/12.   Delirium resolved. Hypoxia slowly improving and CCM feels that patient may need oxygen at discharge. Cultures all negative. Antibiotics discontinued today. Slow rise in WBC seen but no signs of infection--monitor for now per Dr. Alva. Therapies resumed and patient is WBAT with anterior hip precautions. PT, OT , MD recommending CIR for progression.    Review of Systems  HENT: Negative for hearing loss and neck pain.   Eyes: Negative for blurred vision and double vision.  Respiratory: Negative for shortness of breath.   Cardiovascular: Negative for chest pain and palpitations.  Gastrointestinal: Negative for heartburn and nausea.  Musculoskeletal: Negative for myalgias and back pain.  Neurological: Negative for headaches.  Psychiatric/Behavioral: The patient does not have insomnia.    Past Medical History  Diagnosis Date  . Hypertension   . Arthritis     a. 10/2012 s/p Right THA.  . Hyperlipidemia   . Pulmonary nodule     a. 10/2012 CT: 13mm anterior LUL nodule w/ spiculated appearance - rec PET CT.   Past Surgical History  Procedure Date  . Tonsillectomy   . Total hip arthroplasty 11/03/2012    Procedure: TOTAL HIP ARTHROPLASTY ANTERIOR APPROACH;  Surgeon: Christopher Y Blackman, MD;  Location: [facility];  Service: Orthopedics;   Laterality: Right;  Right Total Hip Arthroplasty, Anterior Approach (C-Arm)  . Tubal ligation    Family History  Problem Relation Age of Onset  . CAD Father    Social History:  Lives alone. Works as a legal assistant. She reports that she has been smoking Cigarettes--1/2 to 1 PPD.  She has a 40 pack-year smoking history. She does not have any smokeless tobacco history on file. She reports that she drinks alcohol-2 drinks/nights.  She reports that she does not use illicit drugs.  Allergies  Allergen Reactions  . Penicillins Hives and Swelling    Scheduled Meds:   . amLODipine  5 mg Oral Daily  . antiseptic oral rinse  15 mL Mouth Rinse QID  . chlorhexidine  15 mL Mouth Rinse BID  . docusate  100 mg Oral BID  . enoxaparin (LOVENOX) injection  30 mg Subcutaneous Q12H  . ferrous sulfate  325 mg Oral TID WC  . folic acid  1 mg Oral Daily  . furosemide  20 mg Oral Daily  . insulin aspart  2-6 Units Subcutaneous Q4H  . ipratropium  0.5 mg Nebulization Q6H  . multivitamin with minerals  1 tablet Oral Daily  . nystatin  5 mL Oral TID  . predniSONE  10 mg Oral Once  . thiamine  100 mg Oral Daily   Or  . thiamine  100 mg Intravenous Daily    Medications Prior to Admission  Medication Sig Dispense Refill  . amLODipine-benazepril (LOTREL) 5-20 MG per capsule Take 1 capsule by mouth every morning.      .   atorvastatin (LIPITOR) 20 MG tablet Take 20 mg by mouth daily before breakfast.       . carboxymethylcellulose (REFRESH TEARS) 0.5 % SOLN Place 1 drop into both eyes daily as needed. For dry eyes      . Coenzyme Q10 (CO Q 10) 100 MG CAPS Take 300 mg by mouth at bedtime.      . etodolac (LODINE) 400 MG tablet Take 400 mg by mouth 2 (two) times daily.      . hydrochlorothiazide (HYDRODIURIL) 25 MG tablet Take 25 mg by mouth every morning.      . Multiple Vitamin (MULTIVITAMIN WITH MINERALS) TABS Take 1 tablet by mouth daily.        Home: Home Living Lives With: Alone Available Help at  Discharge: Family Type of Home: House Home Access: Stairs to enter Entrance Stairs-Rails: None Home Layout: Two level;1/2 bath on main level Alternate Level Stairs-Number of Steps: 8+8 Alternate Level Stairs-Rails: None Bathroom Shower/Tub: Walk-in shower Bathroom Toilet: Standard Home Adaptive Equipment: Walker - rolling;Raised toilet seat with rails Additional Comments: shower is upstairs; will sponge bathe initially   Functional History: Prior Function Able to Take Stairs?: Yes Driving: Yes Comments: will have help for a week  Functional Status:  Mobility: Bed Mobility Bed Mobility: Supine to Sit Supine to Sit: 5: Supervision Sitting - Scoot to Edge of Bed: 4: Min guard Sit to Supine: 4: Min assist Transfers Transfers: Stand Pivot Transfers Sit to Stand: 4: Min guard;From bed;With upper extremity assist Sit to Stand: Patient Percentage: 60% Stand to Sit: 4: Min guard;To chair/3-in-1;With upper extremity assist Stand Pivot Transfers: 1: +2 Total assist Stand Pivot Transfers: Patient Percentage: 60% Ambulation/Gait Ambulation/Gait Assistance: 4: Min guard;5: Supervision Ambulation Distance (Feet): 68 Feet Assistive device: Rolling walker Ambulation/Gait Assistance Details: Min cues for posture, positon inside RW. Gait Pattern: Step-to pattern;Decreased stance time - right Gait velocity: decreased General Gait Details: increased time Stairs: No Stairs Assistance: 4: Min assist Stairs Assistance Details (indicate cue type and reason): Cues for sequencing/technique with RW and educated daughter on how to assist pt.  Stair Management Technique: No rails;Step to pattern;Backwards;Forwards;With walker Number of Stairs: 2     ADL: ADL Grooming: Performed;Min guard;Wash/dry hands;Teeth care Where Assessed - Grooming: Supported standing Upper Body Bathing: Minimal assistance Where Assessed - Upper Body Bathing: Unsupported sitting Lower Body Bathing: Maximal  assistance Where Assessed - Lower Body Bathing: Supported sit to stand Upper Body Dressing: Minimal assistance Where Assessed - Upper Body Dressing: Unsupported sitting Lower Body Dressing: Maximal assistance Where Assessed - Lower Body Dressing: Supported sit to stand Toilet Transfer: Performed;Minimal assistance (mod a for sit to stand from 3:1 commode; min cues) Toilet Transfer Method: Sit to stand Toilet Transfer Equipment: Raised toilet seat with arms (or 3-in-1 over toilet) Equipment Used: Rolling walker Transfers/Ambulation Related to ADLs: ambulated to bathroom; on 2 liters 02 ADL Comments: Pt a little shaky during above adls.  Pt a little shaky, needing min guard A.  Cognition: Cognition Arousal/Alertness: Awake/alert Orientation Level: Oriented X4 Cognition Overall Cognitive Status: Appears within functional limits for tasks assessed/performed Area of Impairment: Attention Arousal/Alertness: Awake/alert Orientation Level: Appears intact for tasks assessed Behavior During Session: WFL for tasks performed Current Attention Level: Sustained   Blood pressure 143/71, pulse 63, temperature 98.8 F (37.1 C), temperature source Oral, resp. rate 18, height 5' 2" (1.575 m), weight 54.432 kg (120 lb), SpO2 98.00%. Physical Exam  Nursing note and vitals reviewed. Constitutional: She is oriented to person, place, and   time. She appears well-developed and well-nourished. Nasal cannula in place.  HENT:  Head: Normocephalic and atraumatic.  Right Ear: External ear normal.  Left Ear: External ear normal.  Eyes: Conjunctivae normal and EOM are normal. Pupils are equal, round, and reactive to light.  Neck: Normal range of motion. Neck supple. No JVD present. No tracheal deviation present. No thyromegaly present.  Cardiovascular: Normal rate and regular rhythm.  Exam reveals no friction rub.   No murmur heard. Pulmonary/Chest: Effort normal and breath sounds normal. No respiratory  distress. She has no wheezes. She has no rales. She exhibits no tenderness.       Oxygen via [place]  Abdominal: Soft. Bowel sounds are normal. She exhibits no distension. There is no tenderness.  Musculoskeletal: She exhibits no edema and no tenderness.       Mepilex on right hip incision. Discomfort with ROM right hip.  Lymphadenopathy:    She has no cervical adenopathy.  Neurological: She is alert and oriented to person, place, and time. No cranial nerve deficit.       Moves all 4. Right leg limited proximally due to pain. Strength grossly 4/5 ue and 4/5 LLE otherwise. No sensory deficits  Skin: Skin is warm and dry.  Psychiatric: Judgment and thought content normal. Her mood appears anxious.    Results for orders placed during the hospital encounter of 11/03/12 (from the past 48 hour(s))  GLUCOSE, CAPILLARY     Status: Abnormal   Collection Time   11/14/12  3:36 PM      Component Value Range Comment   Glucose-Capillary 135 (*) 70 - 99 mg/dL   GLUCOSE, CAPILLARY     Status: Abnormal   Collection Time   11/14/12  9:55 PM      Component Value Range Comment   Glucose-Capillary 137 (*) 70 - 99 mg/dL    Comment 1 Documented in Chart      Comment 2 Notify RN     GLUCOSE, CAPILLARY     Status: Abnormal   Collection Time   11/14/12 11:57 PM      Component Value Range Comment   Glucose-Capillary 117 (*) 70 - 99 mg/dL   GLUCOSE, CAPILLARY     Status: Abnormal   Collection Time   11/15/12  3:41 AM      Component Value Range Comment   Glucose-Capillary 120 (*) 70 - 99 mg/dL   CBC     Status: Abnormal   Collection Time   11/15/12  3:45 AM      Component Value Range Comment   WBC 18.6 (*) 4.0 - 10.5 K/uL    RBC 3.27 (*) 3.87 - 5.11 MIL/uL    Hemoglobin 10.1 (*) 12.0 - 15.0 g/dL    HCT 30.5 (*) 36.0 - 46.0 %    MCV 93.3  78.0 - 100.0 fL    MCH 30.9  26.0 - 34.0 pg    MCHC 33.1  30.0 - 36.0 g/dL    RDW 14.9  11.5 - 15.5 %    Platelets 420 (*) 150 - 400 K/uL   BASIC METABOLIC PANEL      Status: Abnormal   Collection Time   11/15/12  3:45 AM      Component Value Range Comment   Sodium 139  135 - 145 mEq/L    Potassium 3.9  3.5 - 5.1 mEq/L    Chloride 101  96 - 112 mEq/L    CO2 30  19 - 32 mEq/L      Glucose, Bld 108 (*) 70 - 99 mg/dL    BUN 24 (*) 6 - 23 mg/dL    Creatinine, Ser 0.70  0.50 - 1.10 mg/dL    Calcium 9.2  8.4 - 10.5 mg/dL    GFR calc non Af Amer 86 (*) >90 mL/min    GFR calc Af Amer >90  >90 mL/min   PHOSPHORUS     Status: Normal   Collection Time   11/15/12  3:45 AM      Component Value Range Comment   Phosphorus 3.1  2.3 - 4.6 mg/dL   MAGNESIUM     Status: Normal   Collection Time   11/15/12  3:45 AM      Component Value Range Comment   Magnesium 1.9  1.5 - 2.5 mg/dL   GLUCOSE, CAPILLARY     Status: Normal   Collection Time   11/15/12  8:09 AM      Component Value Range Comment   Glucose-Capillary 95  70 - 99 mg/dL    Comment 1 Notify RN     GLUCOSE, CAPILLARY     Status: Abnormal   Collection Time   11/15/12 11:50 AM      Component Value Range Comment   Glucose-Capillary 157 (*) 70 - 99 mg/dL    Comment 1 Notify RN     GLUCOSE, CAPILLARY     Status: Abnormal   Collection Time   11/15/12  4:32 PM      Component Value Range Comment   Glucose-Capillary 119 (*) 70 - 99 mg/dL    Comment 1 Notify RN     GLUCOSE, CAPILLARY     Status: Abnormal   Collection Time   11/15/12  7:58 PM      Component Value Range Comment   Glucose-Capillary 137 (*) 70 - 99 mg/dL   GLUCOSE, CAPILLARY     Status: Abnormal   Collection Time   11/16/12 12:19 AM      Component Value Range Comment   Glucose-Capillary 104 (*) 70 - 99 mg/dL   GLUCOSE, CAPILLARY     Status: Normal   Collection Time   11/16/12  4:14 AM      Component Value Range Comment   Glucose-Capillary 98  70 - 99 mg/dL   CBC     Status: Abnormal   Collection Time   11/16/12  5:55 AM      Component Value Range Comment   WBC 19.6 (*) 4.0 - 10.5 K/uL    RBC 3.58 (*) 3.87 - 5.11 MIL/uL     Hemoglobin 11.1 (*) 12.0 - 15.0 g/dL    HCT 34.0 (*) 36.0 - 46.0 %    MCV 95.0  78.0 - 100.0 fL    MCH 31.0  26.0 - 34.0 pg    MCHC 32.6  30.0 - 36.0 g/dL    RDW 15.1  11.5 - 15.5 %    Platelets 513 (*) 150 - 400 K/uL   BASIC METABOLIC PANEL     Status: Abnormal   Collection Time   11/16/12  5:55 AM      Component Value Range Comment   Sodium 140  135 - 145 mEq/L    Potassium 4.0  3.5 - 5.1 mEq/L    Chloride 102  96 - 112 mEq/L    CO2 25  19 - 32 mEq/L    Glucose, Bld 91  70 - 99 mg/dL    BUN 23  6 - 23 mg/dL      Creatinine, Ser 0.70  0.50 - 1.10 mg/dL    Calcium 9.4  8.4 - 10.5 mg/dL    GFR calc non Af Amer 86 (*) >90 mL/min    GFR calc Af Amer >90  >90 mL/min   GLUCOSE, CAPILLARY     Status: Abnormal   Collection Time   11/16/12  8:42 AM      Component Value Range Comment   Glucose-Capillary 134 (*) 70 - 99 mg/dL   GLUCOSE, CAPILLARY     Status: Normal   Collection Time   11/16/12 11:46 AM      Component Value Range Comment   Glucose-Capillary 95  70 - 99 mg/dL    Dg Chest Port 1 View  11/15/2012  *RADIOLOGY REPORT*  Clinical Data: Pulmonary edema  PORTABLE CHEST - 1 VIEW  Comparison: 11/14/2012; 11/12/2012; 11/06/2012; chest CT - 11/06/2012  Findings: Grossly unchanged cardiac silhouette and mediastinal contours given persistently reduced lung volumes. Atherosclerotic calcifications within the thoracic and abdominal aorta.  Interval removal of left jugular approach central venous catheter.  Grossly unchanged bilateral heterogeneous perihilar predominant airspace opacities.  No new focal airspace opacities.  No definite pleural effusion or pneumothorax.  Unchanged bones.  IMPRESSION: Grossly unchanged extensive bilateral airspace opacities, again, worrisome for pulmonary edema, though atypical infection may have a similar appearance.   Original Report Authenticated By: John Watts V, MD     Post Admission Physician Evaluation: 1. Functional deficits secondary  to OA of right hip  s/p right THA with post-op CHF and respiratory failure. 2. Patient is admitted to receive collaborative, interdisciplinary care between the physiatrist, rehab nursing staff, and therapy team. 3. Patient's level of medical complexity and substantial therapy needs in context of that medical necessity cannot be provided at a lesser intensity of care such as a SNF. 4. Patient has experienced substantial functional loss from his/her baseline which was documented above under the "Functional History" and "Functional Status" headings.  Judging by the patient's diagnosis, physical exam, and functional history, the patient has potential for functional progress which will result in measurable gains while on inpatient rehab.  These gains will be of substantial and practical use upon discharge  in facilitating mobility and self-care at the household level. 5. Physiatrist will provide 24 hour management of medical needs as well as oversight of the therapy plan/treatment and provide guidance as appropriate regarding the interaction of the two. 6. 24 hour rehab nursing will assist with bladder management, bowel management, safety, skin/wound care, disease management, medication administration, pain management and patient education  and help integrate therapy concepts, techniques,education, etc. 7. PT will assess and treat for:  Lower extremity strength, range of motion, stamina, balance, functional mobility, safety, adaptive techniques and equipment, pain, hip precautions.  Goals are: mod I. 8. OT will assess and treat for: ADL's, functional mobility, safety, upper extremity strength, adaptive techniques and equipment, pain, hip precautions.   Goals are: mod I. 9. SLP will assess and treat for: n/a.  Goals are: n/a. 10. Case Management and Social Worker will assess and treat for psychological issues and discharge planning. 11. Team conference will be held weekly to assess progress toward goals and to determine barriers to  discharge. 12. Patient will receive at least 3 hours of therapy per day at least 5 days per week. 13. ELOS: 1 week      Prognosis:  excellent   Medical Problem List and Plan: 1. DVT Prophylaxis/Anticoagulation: Pharmaceutical: Lovenox 2. Pain Management: tylenol #3 used on prn   basis with good results. 3. Mood:  No signs of distress noted. Will have LCSW follow for formal evaluation 4. Neuropsych: This patient is capable of making decisions on his/her own behalf. 5. ABLA: Continue iron supplement tid. 6. Suspected HCAP: completed 10 day course of Levaquin on 12/18. 7. Thrush: nystatin mouth wash added.  8. Pulmonary edema: Continue lasix daily for now.  9. Acute respiratory failure: prn nebs. Will need follow up with [facility] for PFTs and decision regarding bronchodilators on outpatient basis. Emphasize tobacco cessation.    Zach Swartz, MD 11/16/2012  

## 2012-11-17 NOTE — Evaluation (Signed)
Physical Therapy Assessment and Plan  Patient Details  Name: [name] [name] MRN: [id number] Date of Birth: 1940/12/22  PT Diagnosis: Difficulty walking, Muscle weakness and Pain in right hip with increased activity Rehab Potential: Good ELOS: 5 days   Today's Date: 11/17/2012 Time: 4098-1191 Time Calculation (min): 61 min  Problem List:  Patient Active Problem List  Diagnosis  . Degenerative arthritis of hip  . Hypoxemia  . SOB (shortness of breath)  . Confusion  . Fever  . S/P total hip arthroplasty  . CHF (congestive heart failure)  . HCAP (healthcare-associated pneumonia)  . Acute respiratory failure  . Normocytic anemia  . Hypokalemia  . Acute diastolic heart failure  . Hypotension    Past Medical History:  Past Medical History  Diagnosis Date  . Hypertension   . Arthritis     a. 10/2012 s/p Right THA.  Marland Kitchen Hyperlipidemia   . Pulmonary nodule     a. 10/2012 CT: 13mm anterior LUL nodule w/ spiculated appearance - rec PET CT.   Past Surgical History:  Past Surgical History  Procedure Date  . Tonsillectomy   . Total hip arthroplasty 11/03/2012    Procedure: TOTAL HIP ARTHROPLASTY ANTERIOR APPROACH;  Surgeon: Kathryne Hitch, MD;  Location: [facility];  Service: Orthopedics;  Laterality: Right;  Right Total Hip Arthroplasty, Anterior Approach (C-Arm)  . Tubal ligation     Assessment & Plan Clinical Impression: [name] [name] is a 71 y.o. female with history of lung disease, HTN, right hip pain due to endstage DJD and failure of conservative therapy. Patient elected to undergo R-THR on 11/03/12 by Dr. Magnus Ivan. Post op course complicated by hypoxia due to acute CHF and was diuresed per Dr. Ludwig Clarks input. Cardiac echo done with normal EF. Patient with developed acute respiratory distress requiring intubation on 12/10. She was started on IV antibiotics for suspected HCAP and pressors for hypotensive shock. She has had problems with agitation and self extubated on  11/13/12. Patient transferred to CIR on 11/16/2012 .   Patient currently requires supervision with all functional mobility except she requires min assist for stair negotiation secondary to muscle weakness and increased pain in R hip during activity.  Prior to hospitalization, patient was independent with mobility and lived with Alone in a House home.  Home access is 2Stairs to enter.  Patient will benefit from skilled PT intervention to maximize safe functional mobility and minimize fall risk for planned discharge home with intermittent assist.  Anticipate patient will benefit from follow up [facility] at discharge.  PT - End of Session Activity Tolerance: Endurance does not limit participation in activity Endurance Deficit: Yes PT Assessment Rehab Potential: Good Barriers to Discharge: Decreased caregiver support PT Plan PT Intensity: Minimum of 1-2 x/day ,45 to 90 minutes PT Frequency: 5 out of 7 days PT Duration Estimated Length of Stay: 5 days PT Treatment/Interventions: Ambulation/gait training;Balance/vestibular training;Discharge planning;DME/adaptive equipment instruction;Functional mobility training;Neuromuscular re-education;Pain management;Patient/family education;Psychosocial support;Therapeutic Exercise;Therapeutic Activities;Stair training;UE/LE Strength taining/ROM;UE/LE Coordination activities;Wheelchair propulsion/positioning PT Recommendation Follow Up Recommendations: Home health PT Equipment Recommended: Rolling walker with 5" wheels;Cane Equipment Details: rolling walker vs. cane, assessment ongoing  PT Evaluation Precautions/Restrictions Precautions Precautions: Anterior Hip;Fall; Patient educated about anterior hip precautions and WBAT; Patient able to recall 3/4 anterior hip precautions at end of session. Precaution Booklet Issued: Yes (comment) Restrictions Weight Bearing Restrictions: Yes RLE Weight Bearing: Weight bearing as tolerated R LE Vital Signs Therapy  Vitals Pulse Rate: 110, received from OT s/p bathing and dressing. HR recovered to 75  bpm after approximately 3 minutes of rest. Oxygen Therapy SpO2: 96 %; SpO2 remained 92-96% throughout entire session on room air. O2 Device: None (Room air) Pulse Oximetry Type: Intermittent Pain Pain Assessment Pain Assessment: No/denies pain Pain Score: 0-No pain Home Living/Prior Functioning Home Living Lives With: Alone Available Help at Discharge: Family;Friend(s);Available 24 hours/day Type of Home: House Home Access: Stairs to enter [employer] of Steps: 2 Entrance Stairs-Rails: None Home Layout: Two level;1/2 bath on main level;Bed/bath upstairs;Other (Comment) (L handrail ascending) Alternate Level Stairs-Number of Steps: 8 then a landing then 8 steps again; 2 steps down to living room- no handrails Alternate Level Stairs-Rails: Left Bathroom Shower/Tub: Walk-in shower;Door Bathroom Toilet: Handicapped height Home Adaptive Equipment: Grab bars in shower;Grab bars around toilet;Raised toilet seat with rails Additional Comments: Upstairs is handicapped height. Downstairs is standard toilet with  raised toilet seat with handles.  Prior Function Level of Independence: Independent with basic ADLs;Independent with homemaking with ambulation;Independent with gait;Independent with transfers Able to Take Stairs?: Yes Driving: Yes Vocation: Full time employment Vocation Requirements: Librarian, academic Comments: will have help for a week Vision/Perception  Vision - History Baseline Vision: Wears glasses all the time Patient Visual Report: No change from baseline  Cognition Overall Cognitive Status: Appears within functional limits for tasks assessed Arousal/Alertness: Awake/alert Orientation Level: Oriented X4 Sensation Sensation Light Touch: Appears Intact Stereognosis: Appears Intact Hot/Cold: Appears Intact Proprioception: Appears Intact Coordination Gross Motor Movements are  Fluid and Coordinated: Yes Fine Motor Movements are Fluid and Coordinated: Yes Motor  Motor Motor: Within Functional Limits  Mobility Transfers Sit to Stand: 5: Supervision;With upper extremity assist Stand to Sit: 5: Supervision;With upper extremity assist Locomotion  Ambulation Ambulation: Yes Ambulation/Gait Assistance: 5: Supervision Ambulation Distance (Feet): 174 Feet x1, 150' x1 Assistive device: Rolling walker Ambulation/Gait Assistance Details: Verbal cues for precautions/safety;Verbal cues for gait pattern;Verbal cues for safe use of DME/AE Gait Gait: Yes Gait Pattern: Step-through pattern;Decreased stance time - right Stairs / Additional Locomotion Stairs: Yes Stairs Assistance: 4: Min assist Stairs Assistance Details: Verbal cues for sequencing;Verbal cues for technique;Verbal cues for precautions/safety;Verbal cues for safe use of DME/AE Stair Management Technique: Two rails;Step to pattern;Forwards Number of Stairs: 10  Ramp: Not tested (comment) Curb: Not tested (comment) Corporate treasurer: Yes Wheelchair Assistance: 5: Supervision Wheelchair Assistance Details: Verbal cues for sequencing;Verbal cues for technique;Verbal cues for Engineer, drilling: Both upper extremities Wheelchair Parts Management: Supervision/cueing Distance: 75  Extremity Assessment  RUE Assessment RUE Assessment: Within Functional Limits (MMT: 4/5) LUE Assessment LUE Assessment: Within Functional Limits (MMT: 4/5) RLE Assessment RLE Assessment: Exceptions to [facility] RLE Strength RLE Overall Strength: Deficits RLE Overall Strength Comments: Hip flex/ext/add not tested secondary to precautions; Knee flex/ext and ankle DF/PF grossly 4/5 LLE Assessment LLE Assessment: Within Functional Limits  See FIM for current functional status Refer to Care Plan for Long Term Goals  Recommendations for other services: None  Discharge Criteria: Patient will  be discharged from PT if patient refuses treatment 3 consecutive times without medical reason, if treatment goals not met, if there is a change in medical status, if patient makes no progress towards goals or if patient is discharged from hospital.  The above assessment, treatment plan, treatment alternatives and goals were discussed and mutually agreed upon: by patient  Chipper Herb. Abbigail Anstey, PT, DPT  11/17/2012, 10:56 AM

## 2012-11-17 NOTE — Progress Notes (Signed)
Pt was admitted to 4031.No apparent issues at this time

## 2012-11-18 ENCOUNTER — Inpatient Hospital Stay (HOSPITAL_COMMUNITY): Payer: Medicare Other | Admitting: *Deleted

## 2012-11-18 ENCOUNTER — Inpatient Hospital Stay (HOSPITAL_COMMUNITY): Payer: Medicare Other

## 2012-11-18 LAB — BASIC METABOLIC PANEL
BUN: 24 mg/dL — ABNORMAL HIGH (ref 6–23)
Calcium: 9.3 mg/dL (ref 8.4–10.5)
GFR calc Af Amer: >90 mL/min (ref >90)
GFR calc non Af Amer: 85 mL/min — ABNORMAL LOW (ref >90)
Glucose, Bld: 124 mg/dL — ABNORMAL HIGH (ref 70–99)

## 2012-11-18 MED ORDER — LEVOFLOXACIN 750 MG PO TABS
750.0000 mg | ORAL_TABLET | Freq: Every day | ORAL | Status: DC
  Administered 2012-11-18 – 2012-11-21 (×4): 750 mg via ORAL
  Filled 2012-11-18 (×5): qty 1

## 2012-11-18 NOTE — Progress Notes (Signed)
Physical Therapy Session Note  Patient Details  Name: [name] [name] MRN: [id number] Date of Birth: 23-Aug-1941  Today's Date: 11/18/2012 Time: 4098-1191, 1000-1049, and 4782-9562 Time Calculation (min): 25 min, 49 min, and 40 min  Short Term Goals: Week 1:  PT Short Term Goal 1 (Week 1): STGs=LTGs due to short ELOS PT Short Term Goal 2 (Week 1): Patient will recall 100% anterior hip precautions without verbal cues.  Skilled Therapeutic Interventions/Progress Updates:    AM Session#1: Patient missed 20 minutes of physical therapy secondary to nursing care. Patient's son arrives during nursing care. Discussion with [patient's son about installation of handrails for steps to enter home. Son states he will also be installing handrails for the flight of stairs inside the home and for the two steps down to the living room. Discussion with patient's son about d/c plan. Patient's son states that patient is welcome to d/c to her daughter's home for a week (level entry), but patient's son unsure whether patient is agreeable. Patient wants to d/c home alone. Discussion about recommendations for patient to rest and not take on too much after d/c secondary to patient stating she wants to go Christmas shopping.  Patient able to recall 100% of hip precautions. Patient performs car transfer to low car (patient will d/c from hospital in [facility] sedan) with rolling walker and supervision. Patient educated on on not using door for support when getting out of car since it can move. Patient verbalized understanding. Patient instructed in gait training [phone] with rolling walker and supervision, requiring verbal cues to stay inside and close to rolling walker. SpO2 rates remain 92-96% with all functional mobility throughout session. Patient left seated in wheelchair with all needs within reach.  AM Session #2: Upon entering room, patient ambulating around room without rolling walker. Patient reports she "needed to  clean up". Education about falls risk, calling for assistance, importance of using rolling walker to protect integrity of R hip joint, low SpO2 rates with functional mobility and the safety risk for ambulating alone with low O2 rates. Patient verbalized understanding and states "I promise I will call for help from now on."   Patient negotiated 10 stairs with R handrail and step-to, lateral pattern with B UEs on handrail. Patient requires supervision for verbal cues for sequencing and technique (patient attempt to ascend/descend leading with incorrect foot). Patient SpO2 dropped to 81% s/p stair negotiation, but recovered to 90% after approximately 60 seconds with instruction on pursed lip breathing, then up to 94% after another 30-45 seconds. Patient negotiated ramp and curb x2 with rolling walker and supervision for verbal cues for proper sequencing and technique. Patient requires repeated verbal cues for which LE to lead with when ascending and descending curb.  Patient SpO2 dropped to 84% s/p ramp and curb negotiation, but recovered to 92% after approximately 45 seconds, then up to 95% after another 30 seconds. PA notified and verbal orders given for patient to receive 1L O2 via [place] during next therapy session.  Patient instructed in gait training x 162' with rolling walker and supervision required for proper use of rolling walker and maintaining feet within rolling walker. SpO2 remains 92% s/p gait training. Patient left seated in arm chair in room with all needs within reach. Patient encouraged to call for assistance and educated that she is not to ambulate around her room alone. Patient verbalized understanding.  PM Session: Patient received supine in bed. Patient recalls 100% of hip precautions. Patient supine>sit independently. SpO2 95% on room  air sitting edge of bed. Patient with requests to use restroom. Patient ambulated to restroom with rolling walker and supervision secondary to verbal cues for  rolling walker management over threshold into bathroom. Patient able to adjust clothing before and after toileting and perform hygiene independently. Patient ambulated out of bathroom and sits on chair in room. SpO2 81% and requires more than 1 minute to increase to 90%. Secondary to verbal orders from PA, patient put on 1L O2 via [place]. SpO2 recovers to 97%.  Patient instructed in gait training 157' x2 with rolling walker and supervision. SpO2 93% s/p ambulation. Patient negotiated 10 stairs with R handrail and step-to, lateral pattern with B UEs on handrail. Patient requires supervision for verbal cues for sequencing and technique. SpO2 92% s/p stair negotiation. Patient performed standing LE there ex: heel raises x20, mini squats x20 with rolling walker for B UE support.  Patient returned to room and assisted back to bed. Patient left supine in bed with all needs within reach. Patient encouraged to call for assistance.  Therapy Documentation Precautions:  Precautions Precautions: Anterior Hip;Fall Precaution Booklet Issued: Yes (comment) Precaution Comments: monitor O2 Restrictions Weight Bearing Restrictions: Yes RLE Weight Bearing: Weight bearing as tolerated Other Position/Activity Restrictions: WBAT R LE General: Amount of Missed PT Time (min): 20 Minutes Missed Time Reason: Nursing care Vital Signs: Oxygen Therapy SpO2: 96 % O2 Device: None (Room air) Pulse Oximetry Type: Intermittent Pain: Pain Assessment Pain Assessment: 0-10 Pain Score:   4 Pain Type: Acute pain Pain Location: Hip Pain Orientation: Right Pain Descriptors: Aching Pain Onset: Gradual Pain Intervention(s): Repositioned Mobility: Bed Mobility Bed Mobility: Supine to Sit;Sit to Supine Supine to Sit: 7: Independent Sit to Supine: 7: Independent Transfers Sit to Stand: 5: Supervision;From chair/3-in-1;From bed;With armrests;With upper extremity assist Sit to Stand Details: Verbal cues for  precautions/safety;Verbal cues for technique Sit to Stand Details (indicate cue type and reason): Patient requires repeated verbal cues for proper handplacement. Stand to Sit: 5: Supervision;With armrests;To chair/3-in-1;With upper extremity assist Stand to Sit Details (indicate cue type and reason): Verbal cues for technique;Verbal cues for precautions/safety Stand to Sit Details: Patient requires repeated verbal cues for proper handplacement. Stand Pivot Transfers: 5: Supervision Stand Pivot Transfer Details: Verbal cues for precautions/safety;Verbal cues for technique Stand Pivot Transfer Details (indicate cue type and reason): Patient requires verbal cues to remain inside rolling walker. Locomotion : Ambulation Ambulation: Yes Ambulation/Gait Assistance: 5: Supervision Assistive device: Rolling walker Ambulation/Gait Assistance Details: Verbal cues for precautions/safety;Verbal cues for gait pattern;Verbal cues for safe use of DME/AE Ambulation/Gait Assistance Details: Patient requires verbal cues to remain inside rolling walker and to limit R hip hiking. Gait Gait: Yes Gait Pattern: Step-through pattern;Decreased step length - right;Decreased stance time - right;Decreased stride length;Narrow base of support;Right hip hike Stairs / Additional Locomotion Stairs: Yes Stairs Assistance: 5: Supervision Stairs Assistance Details: Verbal cues for technique;Verbal cues for precautions/safety Stair Management Technique: One rail Right;Step to pattern;Sideways Number of Stairs: 10  Height of Stairs: 6  Ramp: 5: Supervision;Other (comment) (with rolling walker; verbal cues for sequencing and technique) Curb: 5: Supervision;Other (comment) (with rolling walker; verbal cues for sequencing and technique) Wheelchair Mobility Wheelchair Mobility: No Wheelchair Assistance: 1: +1 Total assist Wheelchair Parts Management: Needs assistance Distance: 150   See FIM for current functional  status  Therapy/Group: Individual Therapy  Chipper Herb. Makya Yurko, PT, DPT  11/18/2012, 11:53 AM

## 2012-11-18 NOTE — Progress Notes (Signed)
Social Work Patient ID: [name] [name], female   DOB: 08-14-1941, 71 y.o.   MRN: [id number] Met with pt who reports she did well today.  [name] [name] for her home health follow up.  Made referral to [name] [name] for PT & RN follow up. Aware pt going home on Monday 12/23.  Have ordered DME-rolling walker and tub seat.  Feels will be ready for Monday.

## 2012-11-18 NOTE — Progress Notes (Signed)
Patient ID: Marnette Perkins, female   DOB: [birth year], 71 y.o.   MRN: [id number] 71 y.o. female with history of lung disease, HTN, right hip pain due to endstage DJD and failure of conservative therapy. Patient elected to undergo R-THR on 11/03/12 by Dr. Magnus Ivan. Post op course complicated by hypoxia due to acute CHF and was diuresed per Dr. Ludwig Clarks input. Cardiac echo done with normal EF. Patient with developed acute respiratory distress requiring intubation on 12/10. She was started on IV antibiotics for suspected HCAP and pressors for hypotensive shock. She has had problems with agitation and self extubated on 11/13/12.  Subjective/Complaints: Slept poorly but no sig pain Review of Systems  Constitutional: Positive for malaise/fatigue.  Respiratory: Negative for cough and shortness of breath.   Neurological: Negative for focal weakness.  All other systems reviewed and are negative.   Objective: Vital Signs: Blood pressure 119/52, pulse 92, temperature 98.9 F (37.2 C), temperature source Oral, resp. rate 17, height 5\' 2"  (1.575 m), weight 55.3 kg (121 lb 14.6 oz), SpO2 95.00%. No results found. Results for orders placed during the hospital encounter of 11/16/12 (from the past 72 hour(s))  MRSA PCR SCREENING     Status: Normal   Collection Time   11/16/12  9:21 PM      Component Value Range Comment   MRSA by PCR NEGATIVE  NEGATIVE   CBC WITH DIFFERENTIAL     Status: Abnormal   Collection Time   11/17/12  6:05 AM      Component Value Range Comment   WBC 20.8 (*) 4.0 - 10.5 K/uL    RBC 3.75 (*) 3.87 - 5.11 MIL/uL    Hemoglobin 11.8 (*) 12.0 - 15.0 g/dL    HCT 01.0 (*) 27.2 - 46.0 %    MCV 94.1  78.0 - 100.0 fL    MCH 31.5  26.0 - 34.0 pg    MCHC 33.4  30.0 - 36.0 g/dL    RDW 53.6  64.4 - 03.4 %    Platelets 554 (*) 150 - 400 K/uL    Neutrophils Relative 81 (*) 43 - 77 %    Neutro Abs 16.9 (*) 1.7 - 7.7 K/uL    Lymphocytes Relative 10 (*) 12 - 46 %    Lymphs Abs 2.0  0.7 - 4.0  K/uL    Monocytes Relative 7  3 - 12 %    Monocytes Absolute 1.4 (*) 0.1 - 1.0 K/uL    Eosinophils Relative 3  0 - 5 %    Eosinophils Absolute 0.5  0.0 - 0.7 K/uL    Basophils Relative 0  0 - 1 %    Basophils Absolute 0.0  0.0 - 0.1 K/uL   COMPREHENSIVE METABOLIC PANEL     Status: Abnormal   Collection Time   11/17/12  6:05 AM      Component Value Range Comment   Sodium 141  135 - 145 mEq/L    Potassium 3.8  3.5 - 5.1 mEq/L    Chloride 103  96 - 112 mEq/L    CO2 27  19 - 32 mEq/L    Glucose, Bld 113 (*) 70 - 99 mg/dL    BUN 26 (*) 6 - 23 mg/dL    Creatinine, Ser 7.42  0.50 - 1.10 mg/dL    Calcium 9.6  8.4 - 59.5 mg/dL    Total Protein 6.3  6.0 - 8.3 g/dL    Albumin 3.1 (*) 3.5 - 5.2 g/dL    AST  25  0 - 37 U/L    ALT 23  0 - 35 U/L    Alkaline Phosphatase 73  39 - 117 U/L    Total Bilirubin 0.7  0.3 - 1.2 mg/dL    GFR calc non Af Amer 74 (*) >90 mL/min    GFR calc Af Amer 85 (*) >90 mL/min      HEENT: normal Cardio: RRR Resp: CTA B/L GI: BS positive Extremity:  No Edema Skin:   Wound C/D/I Neuro: Alert/Oriented, Cranial Nerve II-XII normal and Normal Sensory Musc/Skel:  Other hip prec Gen NAD   Assessment/Plan: 1. Functional deficits secondary to Acute respiratory failure due to post op pneumonia which require 3+ hours per day of interdisciplinary therapy in a comprehensive inpatient rehab setting. Physiatrist is providing close team supervision and 24 hour management of active medical problems listed below. Physiatrist and rehab team continue to assess barriers to discharge/monitor patient progress toward functional and medical goals. FIM: FIM - Bathing Bathing Steps Patient Completed: Chest;Right Arm;Left Arm;Abdomen;Front perineal area;Buttocks;Right upper leg;Left upper leg;Right lower leg (including foot);Left lower leg (including foot) Bathing: 5: Supervision: Safety issues/verbal cues  FIM - Upper Body Dressing/Undressing Upper body dressing/undressing steps  patient completed: Thread/unthread right bra strap;Thread/unthread left bra strap;Hook/unhook bra;Thread/unthread right sleeve of pullover shirt/dresss;Thread/unthread left sleeve of pullover shirt/dress;Put head through opening of pull over shirt/dress;Pull shirt over trunk Upper body dressing/undressing: 5: Supervision: Safety issues/verbal cues FIM - Lower Body Dressing/Undressing Lower body dressing/undressing steps patient completed: Thread/unthread right underwear leg;Thread/unthread left underwear leg;Pull underwear up/down;Thread/unthread right pants leg;Thread/unthread left pants leg;Pull pants up/down;Don/Doff right sock;Don/Doff left sock;Don/Doff right shoe;Don/Doff left shoe Lower body dressing/undressing: 4: Min-Patient completed 75 plus % of tasks  FIM - Toileting Toileting steps completed by patient: Adjust clothing prior to toileting;Performs perineal hygiene;Adjust clothing after toileting Toileting: 5: Supervision: Safety issues/verbal cues  FIM - Toilet Transfers Toilet Transfers: 4-To toilet/BSC: Min A (steadying Pt. > 75%);4-From toilet/BSC: Min A (steadying Pt. > 75%)  FIM - Banker Devices: Walker;Arm rests Bed/Chair Transfer: 5: Bed > Chair or W/C: Supervision (verbal cues/safety issues);5: Chair or W/C > Bed: Supervision (verbal cues/safety issues)  FIM - Locomotion: Wheelchair Distance: 75 Locomotion: Wheelchair: 2: Travels 50 - 149 ft with supervision, cueing or coaxing FIM - Locomotion: Ambulation Locomotion: Ambulation Assistive Devices: Designer, industrial/product Ambulation/Gait Assistance: 5: Supervision Locomotion: Ambulation: 5: Travels 150 ft or more with supervision/safety issues  Comprehension Comprehension Mode: Auditory Comprehension: 5-Follows basic conversation/direction: With extra time/assistive device  Expression Expression: 5-Expresses basic needs/ideas: With no assist     Problem Solving Problem Solving:  5-Solves basic problems: With no assist  Memory Memory: 6-Assistive device: No helper  Medical Problem List and Plan:  1. DVT Prophylaxis/Anticoagulation: Pharmaceutical: Lovenox  2. Pain Management: tylenol #3 used on prn basis with good results.  3. Mood: No signs of distress noted. Will have LCSW follow for formal evaluation  4. Neuropsych: This patient is capable of making decisions on his/her own behalf.  5. ABLA: Continue iron supplement tid.  6. Suspected HCAP: completed 10 day course of Levaquin on 12/18. Monitor reoccurence 7. Thrush: nystatin mouth wash added.  8. Pulmonary edema: Continue lasix daily for now.  9. Acute respiratory failure: prn nebs. Will need follow up with [facility] for PFTs and decision regarding bronchodilators on outpatient basis. Emphasize tobacco cessation.      LOS (Days) 2 A FACE TO FACE EVALUATION WAS PERFORMED  KIRSTEINS,ANDREW E 11/18/2012, 8:08 AM

## 2012-11-18 NOTE — Progress Notes (Signed)
Occupational Therapy Session Note  Patient Details  Name: [name] [name] MRN: [id number] Date of Birth: 1941/03/28  Today's Date: 11/18/2012 Time: 0730-0830 Time Calculation (min): 60 min  Short Term Goals: Week 1:  OT Short Term Goal 1 (Week 1): Short Length of Stay STG = LTG  Skilled Therapeutic Interventions/Progress Updates:    ADL re-training completed in shower this AM. Session with focus on functional mobility in bedroom with walker, ADL performance, functional transfers, safety awareness, use of AE to increase functional performance during bathing and dressing. Pt used long handled sponge when bathing. Educated on use of reacher and sock aid to increase functional performance during LB dressing. Patient given HEP with yellow theraband to use in room and at home. Reviewed exercises with patient. Patient verbalized understanding.  Therapy Documentation Precautions:  Precautions Precautions: Anterior Hip;Fall Precaution Booklet Issued: Yes (comment) Precaution Comments: monitor O2 Restrictions Weight Bearing Restrictions: Yes RLE Weight Bearing: Weight bearing as tolerated Other Position/Activity Restrictions: WBAT R LE Pain: Pain Assessment Pain Assessment: 0-10   See FIM for current functional status  Therapy/Group: Individual Therapy  Limmie Patricia, OTR/L 11/18/2012, 10:35 AM

## 2012-11-19 ENCOUNTER — Inpatient Hospital Stay (HOSPITAL_COMMUNITY): Payer: Medicare Other | Admitting: *Deleted

## 2012-11-19 ENCOUNTER — Inpatient Hospital Stay (HOSPITAL_COMMUNITY): Payer: Medicare Other | Admitting: Physical Therapy

## 2012-11-19 MED ORDER — FUROSEMIDE 20 MG PO TABS
20.0000 mg | ORAL_TABLET | Freq: Once | ORAL | Status: AC
  Administered 2012-11-19: 20 mg via ORAL
  Filled 2012-11-19: qty 1

## 2012-11-19 NOTE — Progress Notes (Signed)
Physical Therapy Note  Patient Details  Name: [name] [name] MRN: [id number] Date of Birth: 1941-09-01 Today's Date: 11/19/2012  9147-8295 (55 minutes) individual Pain: no reported pain Oxygen sats (resting) 91`% RA, pulse 107 Focus of treatment: Therapeutic exercises bilateral LEs to improve AROM/strength; gait training Treatment: wc mobility - SBA unit with increased time; gait 150 feet RW SBA WBAT RT LE X 1; up/down 2 steps with one rail SBA with vcs for sequencing; bilateral LE AROM/strengthening- heel slides , hip abduction , ankle pumps (X 20) ; sit to stand 2 X 5 for quad strengthening; transfers SBA RW with vcs for removal of legrests prior to standing.   1300-1355 (55 minutes) group Pt participated in PT group session focused on gait training/safety/endurance. Pt ambulates to /from gym 150 feet RW SBA (Oxygen sats 85% RA post gait returning to 92% approximately 1 minute); gait 160 feet X 1 RW SBA; up/down 2-3 steps alternating rails with vcs for sequencing.    Oliver Neuwirth,JIM 11/19/2012, 7:31 AM

## 2012-11-19 NOTE — Progress Notes (Signed)
Occupational Therapy Session Note  Patient Details  Name: [name] [name] MRN: [id number] Date of Birth: [birth year]  Today's Date: 11/19/2012 Time: 1100-1215  (75 min) Time Calculation (min): 75 min  Short Term Goals: Week 1:  OT Short Term Goal 1 (Week 1): Short Length of Stay STG = LTG Week 2:     Skilled Therapeutic Interventions/Progress Updates:    Engaged in functional mobility for shower level ADL.  Pt gathered supplies and tranferred to toilet with Supervision.  Ambulated to tub shower bench and bathed self with supervision.  Oxygen = 89 %.  Instructed pt to take deep breathing exercises to bring oxygen back up.  Instructed pt on bracing self when standing to reach or do any dynamic standing activity.  Pt reported standing in shower was scary for her and reinforced safety and bracing and having someone with her.  Instructed her to have someone in the room when she is walking around.  She verbalized understanding.     Therapy Documentation Precautions:  Precautions Precautions: Anterior Hip;Fall Precaution Booklet Issued: Yes (comment) Precaution Comments: monitor O2 Restrictions Weight Bearing Restrictions: Yes RLE Weight Bearing: Weight bearing as tolerated Other Position/Activity Restrictions: WBAT R LE     Pain: Pain Assessment Pain Score: 0-No pain    Therapy/Group: Individual Therapy  Humberto Seals 11/19/2012, 12:08 PM

## 2012-11-19 NOTE — Progress Notes (Signed)
Patient ID: [name] [name], female   DOB: 1940/12/12, 71 y.o.   MRN: [id number] 71 y.o. female with history of lung disease, HTN, right hip pain due to endstage DJD and failure of conservative therapy. Patient elected to undergo R-THR on 11/03/12 by Dr. Magnus Ivan. Post op course complicated by hypoxia due to acute CHF and was diuresed per Dr. Ludwig Clarks input. Cardiac echo done with normal EF. Patient with developed acute respiratory distress requiring intubation on 12/10. She was started on IV antibiotics for suspected HCAP and pressors for hypotensive shock. She has had problems with agitation and self extubated on 11/13/12.  Subjective/Complaints: Slept poorly but no sig pain Review of Systems  Constitutional: Positive for malaise/fatigue.  Respiratory: Negative for cough and shortness of breath.   Neurological: Negative for focal weakness.  All other systems reviewed and are negative.   Objective: Vital Signs: Blood pressure 129/62, pulse 96, temperature 98.3 F (36.8 C), temperature source Oral, resp. rate 20, height 5\' 2"  (1.575 m), weight 121 lb 14.6 oz (55.3 kg), SpO2 92.00%.    Well-developed well-nourished female in no acute distress. HEENT exam atraumatic, normocephalic, extraocular muscles are intact. Neck is supple. No jugular venous distention no thyromegaly. Chest with few rhonchi without increased work of breathing. Cardiac exam S1 and S2 are regular.  Assessment/Plan: 1. Functional deficits secondary to Acute respiratory failure due to post op pneumonia Medical Problem List and Plan:  1. DVT Prophylaxis/Anticoagulation: Pharmaceutical: Lovenox  2. Pain Management: only mild pain 3. Mood: No signs of distress noted.  4. Neuropsych: This patient is capable of making decisions on his/her own behalf.  5. ABLA: Continue iron supplement tid.  6. Suspected HCAP: completed 10 day course of Levaquin on 12/18. Monitor reoccurence 7. Thrush: nystatin mouth wash added.  8. Pulmonary  edema: Continue lasix daily for now.  9. Acute respiratory failure: prn nebs. Will need follow up with [facility] for PFTs and decision regarding bronchodilators on outpatient basis. Emphasize tobacco cessation.      LOS (Days) 3 A FACE TO FACE EVALUATION WAS PERFORMED  [name] HENRY 11/19/2012, 9:27 AM

## 2012-11-20 ENCOUNTER — Inpatient Hospital Stay (HOSPITAL_COMMUNITY): Payer: Medicare Other | Admitting: Occupational Therapy

## 2012-11-20 ENCOUNTER — Inpatient Hospital Stay (HOSPITAL_COMMUNITY): Payer: Medicare Other | Admitting: Physical Therapy

## 2012-11-20 NOTE — Progress Notes (Signed)
Physical Therapy Discharge Summary  Patient Details  Name: [name] [name] MRN: [id number] Date of Birth: 02-21-1941  Today's Date: 11/20/2012 Time: 0805- 905 (60 minutes)   Patient has met 8 of 8 long term goals due to improved activity tolerance, increased strength, decreased pain, ability to compensate for deficits, improved attention, improved awareness and improved coordination.  Patient to discharge at an ambulatory level Modified Independent.   Patient's care partner (son) has been educated on O2 levels, activity tolerance, energy conservation, use of AD to provide the necessary  assistance at discharge, although patient is modified independent with all functional mobility.  Reasons goals not met: N/A, patient met all LTGs  Recommendation:  Patient will benefit from ongoing skilled PT services in home health setting to continue to advance safe functional mobility, address ongoing impairments in activity tolerance, endurance, strength, and gait training, and minimize fall risk.  Equipment: RW  Reasons for discharge: treatment goals met and discharge from hospital  Patient/family agrees with progress made and goals achieved: Yes  PT Discharge Precautions/Restrictions Precautions Precautions: Anterior Hip;Fall Precaution Comments: monitor 02 Restrictions RLE Weight Bearing: Weight bearing as tolerated Vital Signs Therapy Vitals Temp: 99.1 F (37.3 C) Temp src: Oral Pulse Rate: 125  Resp: 20  BP: 104/68 mmHg Patient Position, if appropriate: Sitting Oxygen Therapy SpO2: 94 % O2 Device: Nasal cannula O2 Flow Rate (L/min): 1 L/min Pulse Oximetry Type: Intermittent  Without 02 patient's Sp02 = 84% with standing and ambulation and unable to increase it to >90% with pursed lip breathing and rest; required application of 1 L02 to maintain >90% with activity.    Patient educated on use of supplemental 02 at home during activity/exertion to maintain Sp02 >90% and importance  of maintaining good ventilation and perfusion to brain and organs.  Also educated on pursed lip breathing to maintain pressure for improved ventilation and perfusion at rest and with activity. Pain Pain Assessment Pain Assessment: No/denies pain Sensation Sensation Light Touch: Appears Intact Hot/Cold: Appears Intact Proprioception: Appears Intact Coordination Gross Motor Movements are Fluid and Coordinated: Yes Motor  Motor Motor - Discharge Observations: Generalized weakness associated to surgery and deconditioning  Mobility Bed Mobility Bed Mobility: Supine to Sit;Sit to Supine Supine to Sit: 6: Modified independent (Device/Increase time);HOB flat Sit to Supine: 6: Modified independent (Device/Increase time);HOB flat (flat mat, no rail through long sitting) Transfers Stand Pivot Transfers: 6: Modified independent (Device/Increase time) Stand Pivot Transfer Details (indicate cue type and reason): Mod I with use of UE pushing from arm rests and use of RW for stand pivots from various surfaces Patient able to verbalize all hip precautions and movements to avoid.   Locomotion  Ambulation Ambulation/Gait Assistance: 6: Modified independent (Device/Increase time) Ambulation Distance (Feet): 150 Feet Assistive device: Rolling walker Ambulation/Gait Assistance Details: Patient mod I with RW in controlled environment;  Stairs / Additional Locomotion Stairs Assistance: 6: Modified independent (Device/Increase time) Stairs Assistance Details (indicate cue type and reason): Practiced stair negotiation up and down one step x 2 reps with RW with step to technique for home entry/exit at her daughter's house.  Also performed up and down 12 stairs sideways with R rail with safe step to sequence mod I for stair negotiation when patient returns to her home. Stair Management Technique: One rail Right;Step to pattern;Sideways;Forwards;With walker Number of Stairs: 12  Height of Stairs: 4  Wheelchair  Mobility Wheelchair Mobility: No  Trunk/Postural Assessment  Cervical Assessment Cervical Assessment: Within Functional Limits Thoracic Assessment Thoracic Assessment: Within Functional Limits  Lumbar Assessment Lumbar Assessment: Within Functional Limits  Balance  Mod I static standing balance at sink without UE support to perform grooming activities and mod I dynamic standing balance with UE support on RW secondary to pain in RLE and hip precautions. Extremity Assessment   RLE Assessment RLE Assessment: Exceptions to [facility] RLE Strength RLE Overall Strength: Deficits;Due to precautions;Due to pain RLE Overall Strength Comments: 3/5 hip flexion to 90 deg, 4/5 knee flexion/extention, ankle DF LLE Assessment LLE Assessment: Within Functional Limits  See FIM for current functional status  Edman [address] [facility] 11/20/2012, 8:54 AM Zella Richer. Hitoshi Werts, PT, DPT

## 2012-11-20 NOTE — Progress Notes (Signed)
Occupational Therapy Note  Patient Details  Name: [name] [name] MRN: [id number] Date of Birth: 1941/08/09 Today's Date: 11/20/2012  Time In:  13:00  Time Out:  13:52. Individual session, no c/o pain.  ADL retraining with emphasis on toilet transfers, shower transfers, functional ambulation with RW, safety, increasing activity tolerance (O2 sats stayed above 90% however HR can climb to 125), discharge planning, reviewing home exercise program.  Patient scheduled for discharge tomorrow.  Patient has made excellent progress.   Norton Pastel 11/20/2012, 1:57 PM

## 2012-11-20 NOTE — Progress Notes (Signed)
Patient ID: [name] [name], female   DOB: [birth year], 71 y.o.   MRN: [id number] 71 y.o. female with history of lung disease, HTN, right hip pain due to endstage DJD and failure of conservative therapy. Patient elected to undergo R-THR on 11/03/12 by Dr. Magnus Ivan. Post op course complicated by hypoxia due to acute CHF and was diuresed per Dr. Ludwig Clarks input. Cardiac echo done with normal EF. Patient with developed acute respiratory distress requiring intubation on 12/10. She was started on IV antibiotics for suspected HCAP and pressors for hypotensive shock. She has had problems with agitation and self extubated on 11/13/12.  Subjective/Complaints: Has some hip pain but no other complaints  Review of Systems  Constitutional: Positive for malaise/fatigue.  Respiratory: Negative for cough and shortness of breath.   Neurological: Negative for focal weakness.  All other systems reviewed and are negative.   Objective: Vital Signs: Blood pressure 104/68, pulse 125, temperature 99.1 F (37.3 C), temperature source Oral, resp. rate 20, height 5\' 2"  (1.575 m), weight 121 lb 14.6 oz (55.3 kg), SpO2 94.00%.    Well-developed well-nourished female in no acute distress. HEENT exam atraumatic, normocephalic, extraocular muscles are intact. Neck is supple. No jugular venous distention no thyromegaly. Chest with few rhonchi without increased work of breathing. Cardiac exam S1 and S2 are regular short 2/6 SEM  Assessment/Plan: 1. Functional deficits secondary to Acute respiratory failure due to post op pneumonia Medical Problem List and Plan:  1. DVT Prophylaxis/Anticoagulation: Pharmaceutical: Lovenox  2. Pain Management: only mild pain 3. Mood: No signs of distress noted.  4. Neuropsych: This patient is capable of making decisions on his/her own behalf.  5. ABLA: Continue iron supplement tid.  6. Suspected HCAP: completed 10 day course of Levaquin on 12/18. Monitor reoccurence 7. Thrush: nystatin mouth  wash added.  8. Pulmonary edema: Continue lasix daily for now.  9. Acute respiratory failure: prn nebs. Will need follow up with [facility] for PFTs and decision regarding bronchodilators on outpatient basis. Emphasize tobacco cessation.      LOS (Days) 4 A FACE TO FACE EVALUATION WAS PERFORMED  SWORDS,BRUCE HENRY 11/20/2012, 9:20 AM

## 2012-11-21 DIAGNOSIS — Z96649 Presence of unspecified artificial hip joint: Secondary | ICD-10-CM

## 2012-11-21 DIAGNOSIS — M1610 Unilateral primary osteoarthritis, unspecified hip: Secondary | ICD-10-CM

## 2012-11-21 DIAGNOSIS — I509 Heart failure, unspecified: Secondary | ICD-10-CM

## 2012-11-21 LAB — CBC
MCH: 31.7 pg (ref 26.0–34.0)
MCHC: 33.8 g/dL (ref 30.0–36.0)
Platelets: 520 10*3/uL — ABNORMAL HIGH (ref 150–400)
RDW: 15.2 % (ref 11.5–15.5)

## 2012-11-21 MED ORDER — AMLODIPINE BESYLATE 5 MG PO TABS: 5.0000 mg | ORAL_TABLET | Freq: Every day | ORAL | Status: DC

## 2012-11-21 MED ORDER — FERROUS SULFATE 325 (65 FE) MG PO TABS: 325.0000 mg | ORAL_TABLET | Freq: Three times a day (TID) | ORAL | Status: DC

## 2012-11-21 MED ORDER — ACETAMINOPHEN-CODEINE #3 300-30 MG PO TABS: 1.0000 | ORAL_TABLET | Freq: Four times a day (QID) | ORAL | Status: DC | PRN

## 2012-11-21 MED ORDER — LEVOFLOXACIN 750 MG PO TABS: 750.0000 mg | ORAL_TABLET | Freq: Every day | ORAL | Status: DC

## 2012-11-21 MED ORDER — DOCUSATE SODIUM 50 MG/5ML PO LIQD: 100.0000 mg | Freq: Two times a day (BID) | ORAL | Status: DC

## 2012-11-21 MED ORDER — FOLIC ACID 1 MG PO TABS: 1.0000 mg | ORAL_TABLET | Freq: Every day | ORAL | Status: DC

## 2012-11-21 NOTE — Progress Notes (Signed)
Social Work Patient ID: [name] [name], female   DOB: 12-24-1940, 71 y.o.   MRN: [id number] Pt now requires home o2 have made referral via [facility] aware of discharge today. Daughter here awaiitng discharge instructions and O2.

## 2012-11-21 NOTE — Progress Notes (Signed)
Social Work Discharge Note Discharge Note  The overall goal for the admission was met for:   Discharge location: Yes-HOME WITH FAMILY PROVIDING INTERMITTENT SUPERVISION  Length of Stay: Yes-5 DAYS  Discharge activity level: Yes-MOD/I LEVEL  Home/community participation: Yes  Services provided included: MD, RD, PT, OT, RN, Pharmacy and SW  Financial Services: Medicare and Private Insurance: BCBS  Follow-up services arranged: Home Health: [facility]-PT,RN, DME: [facility]-TUB [facility],ROLLING WALKER and Patient/Family has no preference for HH/DME agencies  Comments (or additional information):  Patient/Family verbalized understanding of follow-up arrangements: Yes  Individual responsible for coordination of the follow-up plan: SELF & SAM-SON  Confirmed correct DME delivered: Lucy Chris 11/21/2012    Lucy Chris

## 2012-11-21 NOTE — Progress Notes (Signed)
Pt discharged to home at 1155 with family. Belongings with family and pt. Discharge instructions given to family and pt via Marissa Nestle, PA with no further questions at this time.

## 2012-11-21 NOTE — Progress Notes (Signed)
SATURATION QUALIFICATIONS: (This note is used to comply with regulatory documentation for home oxygen)  Patient Saturations on Room Air at Rest = 95%  Patient Saturations on Room Air while Ambulating = 86%  Patient Saturations on 1 Liters of oxygen while Ambulating = 96%  Please briefly explain why patient needs home oxygen:

## 2012-11-21 NOTE — Progress Notes (Signed)
Discharge # [phone]

## 2012-11-21 NOTE — Progress Notes (Addendum)
Patient ID: [name] [name], female   DOB: 08/04/1941, 71 y.o.   MRN: [id number] 71 y.o. female with history of lung disease, HTN, right hip pain due to endstage DJD and failure of conservative therapy. Patient elected to undergo R-THR on 11/03/12 by Dr. Magnus Ivan. Post op course complicated by hypoxia due to acute CHF and was diuresed per Dr. Ludwig Clarks input. Cardiac echo done with normal EF. Patient with developed acute respiratory distress requiring intubation on 12/10. She was started on IV antibiotics for suspected HCAP and pressors for hypotensive shock. She has had problems with agitation and self extubated on 11/13/12.  Subjective/Complaints: No CP no SOB at rest Mod I in room Review of Systems  Constitutional: Positive for malaise/fatigue.  Respiratory: Negative for cough and shortness of breath.   Neurological: Negative for focal weakness.  All other systems reviewed and are negative.   Objective: Vital Signs: Blood pressure 135/69, pulse 87, temperature 99.2 F (37.3 C), temperature source Oral, resp. rate 20, height 5\' 2"  (1.575 m), weight 55.3 kg (121 lb 14.6 oz), SpO2 95.00%. No results found. Results for orders placed during the hospital encounter of 11/16/12 (from the past 72 hour(s))  BASIC METABOLIC PANEL     Status: Abnormal   Collection Time   11/18/12  9:30 AM      Component Value Range Comment   Sodium 141  135 - 145 mEq/L    Potassium 3.5  3.5 - 5.1 mEq/L    Chloride 103  96 - 112 mEq/L    CO2 25  19 - 32 mEq/L    Glucose, Bld 124 (*) 70 - 99 mg/dL    BUN 24 (*) 6 - 23 mg/dL    Creatinine, Ser 4.09  0.50 - 1.10 mg/dL    Calcium 9.3  8.4 - 81.1 mg/dL    GFR calc non Af Amer 85 (*) >90 mL/min    GFR calc Af Amer >90  >90 mL/min      HEENT: normal Cardio: RRR Resp: CTA B/L GI: BS positive Extremity:  No Edema Skin:   Wound C/D/I Neuro: Alert/Oriented, Cranial Nerve II-XII normal and Normal Sensory Musc/Skel:  Other hip prec Gen NAD   Assessment/Plan: 1.  Functional deficits secondary to Acute respiratory failure due to post op pneumonia R THR-f/u ortho Pneumonia, lung mass f/u Pulm PCP f/u No PMR f/u          FIM: FIM - Bathing Bathing Steps Patient Completed: Chest;Right Arm;Left Arm;Abdomen;Front perineal area;Buttocks;Right upper leg;Left lower leg (including foot);Right lower leg (including foot);Left upper leg Bathing: 6: Assistive device (Comment) (shower seat and grab bars pt obtained items mod i)  FIM - Upper Body Dressing/Undressing Upper body dressing/undressing steps patient completed: Thread/unthread right bra strap;Thread/unthread left bra strap;Hook/unhook bra;Thread/unthread right sleeve of pullover shirt/dresss;Thread/unthread left sleeve of pullover shirt/dress;Put head through opening of pull over shirt/dress;Pull shirt over trunk Upper body dressing/undressing: 7: Complete Independence: No helper (obtained items mod I) FIM - Lower Body Dressing/Undressing Lower body dressing/undressing steps patient completed: Thread/unthread right underwear leg;Thread/unthread left underwear leg;Pull underwear up/down;Thread/unthread right pants leg;Thread/unthread left pants leg;Pull pants up/down;Fasten/unfasten pants;Don/Doff right sock;Don/Doff left sock;Don/Doff right shoe;Don/Doff left shoe (n/a for shoe fastening) Lower body dressing/undressing: 6: Assistive device (Comment) (sock donner and LH reacher)  FIM - Toileting Toileting steps completed by patient: Adjust clothing prior to toileting;Performs perineal hygiene;Adjust clothing after toileting Toileting Assistive Devices: Grab bar or rail for support Toileting: 6: Assistive device: No helper  FIM - Diplomatic Services operational officer  Devices: Art gallery manager Transfers: 6-Assistive device: No helper  FIM - Banker Devices: Environmental consultant;Arm rests Bed/Chair Transfer: 6: Supine > Sit: No assist;6: Sit > Supine: No assist;6: Bed > Chair or  W/C: No assist;6: Chair or W/C > Bed: No assist  FIM - Locomotion: Wheelchair Distance: 150 Locomotion: Wheelchair: 0: Activity did not occur FIM - Locomotion: Ambulation Locomotion: Ambulation Assistive Devices: Designer, industrial/product Ambulation/Gait Assistance: 6: Modified independent (Device/Increase time) Locomotion: Ambulation: 6: Travels 150 ft or more with assistive device/no helper  Comprehension Comprehension Mode: Auditory Comprehension: 7-Follows complex conversation/direction: With no assist  Expression Expression Mode: Verbal Expression: 7-Expresses complex ideas: With no assist  Social Interaction Social Interaction: 7-Interacts appropriately with others - No medications needed.  Problem Solving Problem Solving: 7-Solves complex problems: Recognizes & self-corrects  Memory Memory: 7-Complete Independence: No helper  Medical Problem List and Plan:  1. DVT Prophylaxis/Anticoagulation: Pharmaceutical: Lovenox  2. Pain Management: tylenol #3 used on prn basis with good results.  3. Mood: No signs of distress noted. Will have LCSW follow for formal evaluation  4. Neuropsych: This patient is capable of making decisions on his/her own behalf.  5. ABLA: Continue iron supplement tid.  6. Suspected HCAP: completed 10 day course of Levaquin on 12/18. Restarted by Ortho 12/20 7. Thrush: nystatin mouth wash added.  8. Pulmonary edema: Continue lasix daily for now.  9. Left upper lobe nodule f/u pulmonary in 2 weeks      LOS (Days) 5 A FACE TO FACE EVALUATION WAS PERFORMED  KIRSTEINS,ANDREW E 11/21/2012, 9:17 AM

## 2012-11-22 NOTE — Discharge Summary (Signed)
[name] [name], [name] [name]             ACCOUNT NO.:  [id number]  MEDICAL RECORD NO.:  [id number]  LOCATION:  4031                         FACILITY:  [facility]  PHYSICIAN:  Erick Colace, M.D.DATE OF BIRTH:  11/29/1941  DATE OF ADMISSION:  11/16/2012 DATE OF DISCHARGE:  11/21/2012                              DISCHARGE SUMMARY   DISCHARGE DIAGNOSES: 1. Right total hip replacement. 2. Acute respiratory failure due to likely healthcare-associated     pneumonia. 3. Leukocytosis resolving. 4. Acute blood loss anemia. 5. Pulmonary edema. 6. Left upper lobe nodule.  HISTORY OF PRESENT ILLNESS:  Ms. [name] [name] is a 71 year old female with history of lung disease, hypertension, right hip pain due to end- stage DJD, and failure of conservative therapy.  She elected to undergo right total hip replacement on November 03, 2012, by Dr. Magnus Ivan. Postop course was complicated by hypoxia due to acute CHF requiring diuresis. 2D echo done showed a normal EF.  The patient developed acute respiratory distress, requiring intubation on December 10.  She was started on IV antibiotics for suspected HCAP and required pressors for hypotensive shock.  She has had problems with agitation and self- extubated on December 15. She continues to require oxygen per nasal canula.  Therapies were initiated and we are recommending CIR for progressive therapies.  PAST MEDICAL HISTORY:  Hypertension, arthritis, hyperlipidemia, and new diagnosis of pulmonary nodule.  FUNCTIONAL HISTORY:  The patient was independent and working full-time as a Librarian, academic prior to admission.  FUNCTIONAL STATUS:  The patient was supervision to min-guard assist for bed mobility, min assist transfers, min guard assist ambulating 68 feet with rolling walker.  She required min assist for upper body care, max assist for lower body care, min assist for toileting transfers.  LAB TESTS:  Check of lytes from December 20th, revealed  sodium 141, potassium 3.5, chloride 103, CO2 of 25, BUN 24, creatinine 0.74, glucose 124.  CBC at time of discharge reveals hemoglobin 10.4, hematocrit 30.8, white count 17.9, platelets 520.  HOSPITAL COURSE:  Ms. [name] [name] was admitted to rehab on November 16, 2012, for inpatient therapies to consist of PT, OT at least 3 hours 5 days a week.  Past admission, physiatrist, rehab, RN, and therapy team have worked together to provide customized collaborative interdisciplinary care.  Rehab RN has worked with the patient on bowel and bladder program as well as wound care monitoring.  The patient's blood pressures were checked on b.i.d. basis and these have been well controlled ranging with systolics ranging from low 100s to 130 and diastolics in 60's range.  The patient's wound has been monitored along.  Staples were discontinued without difficulty on December 20th.  Incision is intact without signs or symptoms of infection.   The patient's p.o. intake has been good.  She continues to require oxygen with activity level as she desaturates into mid 80s.  She has been educated regarding continuing incentive spirometry past discharge.  Labs done past admission showed the patient to have progressive leukocytosis with white count up at 20.8.  The patient has been afebrile.  No complaints of dysuria or pulmonary symptoms.  Recheck CBC of December 23, showed some improvement with white  count of 17.9.  Dr. Magnus Ivan has followed up on the patient and recommended resuming Levaquin for 10 days duration.   During the patient's stay in rehab, brief team conference was held regarding the patient's progress as well as goals and barriers to discharge.  OT has worked with patient in self-care tasks.  The patient is modified independent for bathing and dressing.  Physical therapy has worked with the patient on activity tolerance strengthening as well as ability to compensate for deficits.  The patient is at  modified independent level for transfers, modified independent level for ambulating short distances with use of rolling walker. The patient is able to navigate 17 stairs with right rail at modified independent level. Family education was done with son regarding activity tolerance, use of assistive device as well as use of oxygen with activity.  Further followup home health PT and RN per [facility] past discharge.  On November 21, 2012, the patient is discharged to home in improved condition.   DISCHARGE MEDICATIONS: 1. Norvasc 5 mg p.o. per day. 2. Colace 100 mg p.o. b.i.d. 3. Ferrous sulfate 325 mg t.i.d. 4. Folic acid 1 mg p.o. per day. 5. Tylenol No. 3,  1 p.o. q.6 h. p.r.n. pain, #45 Rx. 6. Levaquin 750 mg p.o. per day. 7. Lipitor 20 mg p.o. per day. 8. CoQ10 300 mg at bedtime. 9. Multivitamin 1 per day. 10.Refresh Tears as needed.  ACTIVITY LEVEL:  As tolerated with use of walker. Maintain anterior hip precautions.  SPECIAL INSTRUCTIONS:  No driving.  Do not use Lotrel, Lasix, or HCTZ. [facility] to provide PT and RN.  WOUND CARE:  Wash with soap and water, keep dry.  FOLLOWUP:  The patient to follow up with Dr. Magnus Ivan for postop check in the next 1-2 weeks.  Follow up with Dr. Cyril Mourning, January 2nd, at 2 p.m.  Follow up with Dr. Sharion Settler for posthospital checkup in the next 3-4 weeks.  Follow up with Dr. Claudette Laws as needed.     Delle Reining, P.A.   ______________________________ Erick Colace, M.D.    PL/MEDQ  D:  11/21/2012  T:  11/22/2012  Job:  [id number]  cc:   L. Lupe Carney, M.D. Vanita Panda. Magnus Ivan, M.D. Oretha Milch, MD

## 2012-11-25 LAB — LEGIONELLA ANTIGEN, URINE: Legionella Antigen, Urine: NEGATIVE

## 2012-11-26 DIAGNOSIS — Z9981 Dependence on supplemental oxygen: Secondary | ICD-10-CM | POA: Diagnosis not present

## 2012-11-26 DIAGNOSIS — Z96649 Presence of unspecified artificial hip joint: Secondary | ICD-10-CM | POA: Diagnosis not present

## 2012-11-26 DIAGNOSIS — Z471 Aftercare following joint replacement surgery: Secondary | ICD-10-CM | POA: Diagnosis not present

## 2012-11-26 DIAGNOSIS — D62 Acute posthemorrhagic anemia: Secondary | ICD-10-CM | POA: Diagnosis not present

## 2012-11-26 DIAGNOSIS — J189 Pneumonia, unspecified organism: Secondary | ICD-10-CM | POA: Diagnosis not present

## 2012-11-28 DIAGNOSIS — D62 Acute posthemorrhagic anemia: Secondary | ICD-10-CM | POA: Diagnosis not present

## 2012-11-28 DIAGNOSIS — J189 Pneumonia, unspecified organism: Secondary | ICD-10-CM | POA: Diagnosis not present

## 2012-11-28 DIAGNOSIS — Z471 Aftercare following joint replacement surgery: Secondary | ICD-10-CM | POA: Diagnosis not present

## 2012-11-28 DIAGNOSIS — Z96649 Presence of unspecified artificial hip joint: Secondary | ICD-10-CM | POA: Diagnosis not present

## 2012-11-28 DIAGNOSIS — Z9981 Dependence on supplemental oxygen: Secondary | ICD-10-CM | POA: Diagnosis not present

## 2012-12-01 ENCOUNTER — Other Ambulatory Visit (INDEPENDENT_AMBULATORY_CARE_PROVIDER_SITE_OTHER): Payer: Medicare Other

## 2012-12-01 ENCOUNTER — Ambulatory Visit (INDEPENDENT_AMBULATORY_CARE_PROVIDER_SITE_OTHER)
Admission: RE | Admit: 2012-12-01 | Discharge: 2012-12-01 | Disposition: A | Discharge status: Home / Self Care | Payer: Medicare Other | Source: Ambulatory Visit | Attending: Pulmonary Disease | Admitting: Pulmonary Disease

## 2012-12-01 ENCOUNTER — Encounter: Payer: Self-pay | Admitting: Pulmonary Disease

## 2012-12-01 ENCOUNTER — Ambulatory Visit (INDEPENDENT_AMBULATORY_CARE_PROVIDER_SITE_OTHER): Payer: Medicare Other | Admitting: Pulmonary Disease

## 2012-12-01 VITALS — BP 126/65 | HR 113 | Temp 97.5°F | Ht 62.0 in | Wt 122.6 lb

## 2012-12-01 DIAGNOSIS — I10 Essential (primary) hypertension: Secondary | ICD-10-CM | POA: Diagnosis not present

## 2012-12-01 DIAGNOSIS — J84114 Acute interstitial pneumonitis: Secondary | ICD-10-CM | POA: Diagnosis not present

## 2012-12-01 DIAGNOSIS — J84112 Idiopathic pulmonary fibrosis: Secondary | ICD-10-CM | POA: Insufficient documentation

## 2012-12-01 DIAGNOSIS — D62 Acute posthemorrhagic anemia: Secondary | ICD-10-CM | POA: Diagnosis not present

## 2012-12-01 DIAGNOSIS — J8410 Pulmonary fibrosis, unspecified: Secondary | ICD-10-CM | POA: Insufficient documentation

## 2012-12-01 DIAGNOSIS — R911 Solitary pulmonary nodule: Secondary | ICD-10-CM | POA: Diagnosis not present

## 2012-12-01 DIAGNOSIS — Z471 Aftercare following joint replacement surgery: Secondary | ICD-10-CM | POA: Diagnosis not present

## 2012-12-01 DIAGNOSIS — Z9981 Dependence on supplemental oxygen: Secondary | ICD-10-CM | POA: Diagnosis not present

## 2012-12-01 DIAGNOSIS — Z96649 Presence of unspecified artificial hip joint: Secondary | ICD-10-CM | POA: Diagnosis not present

## 2012-12-01 DIAGNOSIS — J189 Pneumonia, unspecified organism: Secondary | ICD-10-CM | POA: Diagnosis not present

## 2012-12-01 LAB — CBC WITH DIFFERENTIAL/PLATELET
Eosinophils Relative: 3.4 % (ref 0.0–5.0)
Lymphocytes Relative: 20.5 % (ref 12.0–46.0)
MCV: 93.3 fl (ref 78.0–100.0)
Monocytes Absolute: 0.6 10*3/uL (ref 0.1–1.0)
Monocytes Relative: 6.6 % (ref 3.0–12.0)
Neutrophils Relative %: 68.8 % (ref 43.0–77.0)
Platelets: 230 10*3/uL (ref 150.0–400.0)
WBC: 9.6 10*3/uL (ref 4.5–10.5)

## 2012-12-01 NOTE — Assessment & Plan Note (Addendum)
She is much improved WBC count has normalized. She does not seem to require oxygen at rest and minimal ambulation. The etiology remains unclear. Infectious versus inflammatory pneumonitis is possible - I favored the latter. Chest xray continues to show interstitial infiltrates. She has no stigmata for collagen-vascular disease. We'll check hypersensitivity panel for completion. Possibilities include an nonspecific interstitial pneumonitis (NSIP) , hypersensitivity pneumonitis or respiratory bronchiolitis- ILD related to smoking. Prefer approach here would be to observe that she is quit smoking and is off steroids. If oxygenation gets worse again she may need biopsy. Smoking cessation is the key here Check oxygen levels during sleep Schedule Breathing test Disability papers were filled out.

## 2012-12-01 NOTE — Patient Instructions (Signed)
Blood work today to check WBC count Chest xray  Take amlodipin 5mg  Take diuretic (HCTZ) as needed for leg swelling. Smoking cessation is the key here Check oxygen levels during sleep Schedule Breathing test

## 2012-12-01 NOTE — Progress Notes (Signed)
  Subjective:    Patient ID: [name] [name], female    DOB: 09-27-1941, 72 y.o.   MRN: [id number]  HPI PCP- Lupe Carney  72 yo smoker without history of lung disease s/p elective total hip arthroplasty on 11/03/12, developed hypoxia on 12/8. CT angio was negative for PE but showed Ground-glass opacities and interstitial prominence throughout the lungs, most pronounced in the mid and upper lung zones. Differential considerations would include infectious or inflammatory pneumonitis or less likely edema.  13 mm anterior left upper lobe nodule with spiculated appearance  BNP 1378 - diuresed, but developed progressive hypoxic resp failure/ ARDS requiring mechanical ventilation. TTE - nml LVEF, no WMA. Intubated 12-10 -12/15. She was then transferred to [facility] and discharged after 5 days with home physical therapy on oxygen.  72 she reports breathing is better. cough is better-dry cough. no wheezing, chest tx. question about short term disability.  Has oxygen but has not used it x 1 week Labs showed  persistent leukocytosis with white count up at 20.8. The patient has been afebrile. No complaints of dysuria or pulmonary symptoms. Recheck CBC of December 23, showed some improvement with white count of 17.9. On 1/2 WC nml CXR showed low volumes with a peripheral and basilar predominant pattern of interstitial coarsening. She has finally been able to quit smoking.    Past Medical History  Diagnosis Date  . Hypertension   . Arthritis     a. 10/2012 s/p Right THA.  Marland Kitchen Hyperlipidemia   . Pulmonary nodule     a. 10/2012 CT: 13mm anterior LUL nodule w/ spiculated appearance - rec PET CT.      Review of Systems neg for any significant sore throat, dysphagia, itching, sneezing, nasal congestion or excess/ purulent secretions, fever, chills, sweats, unintended wt loss, pleuritic or exertional cp, hempoptysis, orthopnea pnd or change in chronic leg swelling. Also denies presyncope, palpitations,  heartburn, abdominal pain, nausea, vomiting, diarrhea or change in bowel or urinary habits, dysuria,hematuria, rash, arthralgias, visual complaints, headache, numbness weakness or ataxia.     Objective:   Physical Exam  Gen. Pleasant, well-nourished, in no distress, normal affect ENT - no lesions, no post nasal drip Neck: No JVD, no thyromegaly, no carotid bruits Lungs: no use of accessory muscles, no dullness to percussion, bibasal lt >> Rt rales 1/3, no rhonchi  Cardiovascular: Rhythm regular, heart sounds  normal, no murmurs or gallops, no peripheral edema Abdomen: soft and non-tender, no hepatosplenomegaly, BS normal. Musculoskeletal: No deformities, no cyanosis or clubbing Neuro:  alert, non focal       Assessment & Plan:

## 2012-12-02 DIAGNOSIS — D62 Acute posthemorrhagic anemia: Secondary | ICD-10-CM | POA: Diagnosis not present

## 2012-12-02 DIAGNOSIS — Z471 Aftercare following joint replacement surgery: Secondary | ICD-10-CM | POA: Diagnosis not present

## 2012-12-02 DIAGNOSIS — Z9981 Dependence on supplemental oxygen: Secondary | ICD-10-CM | POA: Diagnosis not present

## 2012-12-02 DIAGNOSIS — Z96649 Presence of unspecified artificial hip joint: Secondary | ICD-10-CM | POA: Diagnosis not present

## 2012-12-02 DIAGNOSIS — I10 Essential (primary) hypertension: Secondary | ICD-10-CM | POA: Insufficient documentation

## 2012-12-02 DIAGNOSIS — J189 Pneumonia, unspecified organism: Secondary | ICD-10-CM | POA: Diagnosis not present

## 2012-12-02 DIAGNOSIS — R911 Solitary pulmonary nodule: Secondary | ICD-10-CM | POA: Insufficient documentation

## 2012-12-02 LAB — ANA: Anti Nuclear Antibody(ANA): NEGATIVE

## 2012-12-02 LAB — RHEUMATOID FACTOR: Rhuematoid fact SerPl-aCnc: 24 IU/mL — ABNORMAL HIGH (ref <=14)

## 2012-12-02 NOTE — Assessment & Plan Note (Signed)
Take amlodipin 5mg  Take diuretic (HCTZ) as needed for leg swelling.

## 2012-12-02 NOTE — Assessment & Plan Note (Signed)
Will need three-month followup CT once acute issues resolved. High possibility of malignancy.

## 2012-12-05 DIAGNOSIS — M169 Osteoarthritis of hip, unspecified: Secondary | ICD-10-CM | POA: Diagnosis not present

## 2012-12-06 DIAGNOSIS — Z471 Aftercare following joint replacement surgery: Secondary | ICD-10-CM | POA: Diagnosis not present

## 2012-12-06 DIAGNOSIS — Z9981 Dependence on supplemental oxygen: Secondary | ICD-10-CM | POA: Diagnosis not present

## 2012-12-06 DIAGNOSIS — D62 Acute posthemorrhagic anemia: Secondary | ICD-10-CM | POA: Diagnosis not present

## 2012-12-06 DIAGNOSIS — J189 Pneumonia, unspecified organism: Secondary | ICD-10-CM | POA: Diagnosis not present

## 2012-12-06 DIAGNOSIS — Z96649 Presence of unspecified artificial hip joint: Secondary | ICD-10-CM | POA: Diagnosis not present

## 2012-12-07 DIAGNOSIS — I10 Essential (primary) hypertension: Secondary | ICD-10-CM | POA: Diagnosis not present

## 2012-12-07 DIAGNOSIS — E7800 Pure hypercholesterolemia, unspecified: Secondary | ICD-10-CM | POA: Diagnosis not present

## 2012-12-07 DIAGNOSIS — R609 Edema, unspecified: Secondary | ICD-10-CM | POA: Diagnosis not present

## 2012-12-19 ENCOUNTER — Telehealth: Payer: Self-pay | Admitting: Pulmonary Disease

## 2012-12-19 NOTE — Telephone Encounter (Signed)
I spoke with pt and she stated she had ONO. Requesting these results. Please advise RA thanks

## 2012-12-19 NOTE — Telephone Encounter (Signed)
ONO 12/05/12 showed mild desaturation -only 12 mins < 88% - OK to stay off O2 for now & observe until next appt

## 2012-12-20 NOTE — Telephone Encounter (Signed)
I spoke with patient about results and she verbalized understanding and had no questions 

## 2012-12-29 ENCOUNTER — Encounter: Payer: Self-pay | Admitting: Pulmonary Disease

## 2013-01-02 ENCOUNTER — Encounter: Payer: Self-pay | Admitting: Pulmonary Disease

## 2013-01-02 ENCOUNTER — Ambulatory Visit (INDEPENDENT_AMBULATORY_CARE_PROVIDER_SITE_OTHER): Payer: Medicare Other | Admitting: Pulmonary Disease

## 2013-01-02 ENCOUNTER — Other Ambulatory Visit (INDEPENDENT_AMBULATORY_CARE_PROVIDER_SITE_OTHER): Payer: Medicare Other

## 2013-01-02 VITALS — BP 156/74 | HR 114 | Temp 97.1°F | Ht 62.0 in | Wt 125.0 lb

## 2013-01-02 DIAGNOSIS — J84114 Acute interstitial pneumonitis: Secondary | ICD-10-CM

## 2013-01-02 DIAGNOSIS — R0902 Hypoxemia: Secondary | ICD-10-CM

## 2013-01-02 DIAGNOSIS — R911 Solitary pulmonary nodule: Secondary | ICD-10-CM | POA: Diagnosis not present

## 2013-01-02 DIAGNOSIS — J9600 Acute respiratory failure, unspecified whether with hypoxia or hypercapnia: Secondary | ICD-10-CM

## 2013-01-02 DIAGNOSIS — R490 Dysphonia: Secondary | ICD-10-CM | POA: Diagnosis not present

## 2013-01-02 DIAGNOSIS — R600 Localized edema: Secondary | ICD-10-CM

## 2013-01-02 DIAGNOSIS — R609 Edema, unspecified: Secondary | ICD-10-CM | POA: Diagnosis not present

## 2013-01-02 LAB — PULMONARY FUNCTION TEST

## 2013-01-02 MED ORDER — LEVOFLOXACIN 750 MG PO TABS: 750.0000 mg | ORAL_TABLET | Freq: Every day | ORAL | Status: DC

## 2013-01-02 MED ORDER — PREDNISONE 10 MG PO TABS: ORAL_TABLET | ORAL | Status: DC

## 2013-01-02 NOTE — Assessment & Plan Note (Signed)
Lung function appears improved Discontinue oxygen Doppler test of lower extremities Blood work today Pulmonary rehab - let me know if you need a letter for work, pl ask dr Magnus Ivan if you need rehab for hip ENt referral for hoarseness

## 2013-01-02 NOTE — Assessment & Plan Note (Signed)
PET -CT scan in 4 weeks

## 2013-01-02 NOTE — Progress Notes (Signed)
  Subjective:    Patient ID: [name] [name], female    DOB: 06/07/1941, 72 y.o.   MRN: [id number]  HPI PCP- Lupe Carney  72 yo smoker without history of lung disease s/p elective total hip arthroplasty on 11/03/12, developed hypoxia on 12/8. CT angio was negative for PE but showed Ground-glass opacities and interstitial prominence throughout the lungs, most pronounced in the mid and upper lung zones. Differential considerations would include infectious or inflammatory pneumonitis or less likely edema.  13 mm anterior left upper lobe nodule with spiculated appearance  BNP 1378 - diuresed, but developed progressive hypoxic resp failure/ ARDS requiring mechanical ventilation. TTE - nml LVEF, no WMA. Intubated 12-10 -12/15. She was then transferred to [facility] and discharged after 5 days with home physical therapy on oxygen.  Labs showed  persistent leukocytosis with white count up at 20.8.  Recheck CBC of December 23, showed some improvement with white count of 17.9. On 1/2 WC nml CXR showed low volumes with a peripheral and basilar predominant pattern of interstitial coarsening. She has finally been able to quit smoking. ONO 12/05/12 showed mild desaturation -only 12 mins < 88%   01/02/2013 Labs  - RA factor mild POS ANA neg WC down Hypersens panel not drawn SHe had a root canal & is on clindamycin. Stable, off O2 PFTs - no obstruction, mild restriction, FVC 77%, DLCO decreased at 57% She c/o persistent hoarseness & RLE edema - no pain   Past Medical History  Diagnosis Date  . Hypertension   . Arthritis     a. 10/2012 s/p Right THA.  Marland Kitchen Hyperlipidemia   . Pulmonary nodule     a. 10/2012 CT: 13mm anterior LUL nodule w/ spiculated appearance - rec PET CT.   Past Surgical History  Procedure Date  . Tonsillectomy   . Total hip arthroplasty 11/03/2012    Procedure: TOTAL HIP ARTHROPLASTY ANTERIOR APPROACH;  Surgeon: Kathryne Hitch, MD;  Location: [facility];  Service: Orthopedics;   Laterality: Right;  Right Total Hip Arthroplasty, Anterior Approach (C-Arm)  . Tubal ligation       Review of Systems neg for any significant sore throat, dysphagia, itching, sneezing, nasal congestion or excess/ purulent secretions, fever, chills, sweats, unintended wt loss, pleuritic or exertional cp, hempoptysis, orthopnea pnd or change in chronic leg swelling. Also denies presyncope, palpitations, heartburn, abdominal pain, nausea, vomiting, diarrhea or change in bowel or urinary habits, dysuria,hematuria, rash, arthralgias, visual complaints, headache, numbness weakness or ataxia.     Objective:   Physical Exam  Gen. Pleasant, well-nourished, in no distress, normal affect ENT - no lesions, no post nasal drip Neck: No JVD, no thyromegaly, no carotid bruits Lungs: no use of accessory muscles, no dullness to percussion, BL 1/3 rales  -lt coarse, no rhonchi  Cardiovascular: Rhythm regular, heart sounds  normal, no murmurs or gallops, no peripheral edema Abdomen: soft and non-tender, no hepatosplenomegaly, BS normal. Musculoskeletal: No deformities, no cyanosis or clubbing Neuro:  alert, non focal       Assessment & Plan:

## 2013-01-02 NOTE — Assessment & Plan Note (Signed)
Post intubation x 2 dec '13 ENt referral for laryngeal trauma

## 2013-01-02 NOTE — Patient Instructions (Addendum)
Lung function appears improved Discontinue oxygen Doppler test of lower extremities Blood work today PET -CT scan in 4 weeks Pulmonary rehab - let me know if you need a letter for work, pl ask dr Magnus Ivan if you need rehab for hip ENt referral for hoarseness

## 2013-01-02 NOTE — Progress Notes (Signed)
PFT done today. 

## 2013-01-02 NOTE — Assessment & Plan Note (Signed)
Unilateral -RLE Chk venous doppler

## 2013-01-04 DIAGNOSIS — H6120 Impacted cerumen, unspecified ear: Secondary | ICD-10-CM | POA: Diagnosis not present

## 2013-01-04 DIAGNOSIS — J381 Polyp of vocal cord and larynx: Secondary | ICD-10-CM | POA: Diagnosis not present

## 2013-01-04 DIAGNOSIS — H60399 Other infective otitis externa, unspecified ear: Secondary | ICD-10-CM | POA: Diagnosis not present

## 2013-01-04 DIAGNOSIS — R490 Dysphonia: Secondary | ICD-10-CM | POA: Diagnosis not present

## 2013-01-06 ENCOUNTER — Encounter (INDEPENDENT_AMBULATORY_CARE_PROVIDER_SITE_OTHER): Payer: Medicare Other

## 2013-01-06 DIAGNOSIS — R609 Edema, unspecified: Secondary | ICD-10-CM

## 2013-01-06 DIAGNOSIS — M79609 Pain in unspecified limb: Secondary | ICD-10-CM

## 2013-01-06 DIAGNOSIS — R600 Localized edema: Secondary | ICD-10-CM

## 2013-01-09 LAB — HYPERSENSITIVITY PNUEMONITIS PROFILE

## 2013-01-10 ENCOUNTER — Encounter: Payer: Self-pay | Admitting: Pulmonary Disease

## 2013-01-17 ENCOUNTER — Telehealth (HOSPITAL_COMMUNITY): Payer: Self-pay | Admitting: Cardiac Rehabilitation

## 2013-01-17 ENCOUNTER — Ambulatory Visit (HOSPITAL_COMMUNITY): Payer: Medicare Other

## 2013-01-17 NOTE — Telephone Encounter (Signed)
Pt called to cancel her orientation session for pulmonary rehab.  Pt states she is not able to be away from work to attend class.

## 2013-01-19 DIAGNOSIS — J381 Polyp of vocal cord and larynx: Secondary | ICD-10-CM | POA: Diagnosis not present

## 2013-01-19 DIAGNOSIS — H6120 Impacted cerumen, unspecified ear: Secondary | ICD-10-CM | POA: Diagnosis not present

## 2013-01-19 DIAGNOSIS — R490 Dysphonia: Secondary | ICD-10-CM | POA: Diagnosis not present

## 2013-01-23 ENCOUNTER — Telehealth: Payer: Self-pay | Admitting: Pulmonary Disease

## 2013-01-23 NOTE — Telephone Encounter (Signed)
I spoke with  Pt and and is aware of this. Please advise RA thanks

## 2013-01-23 NOTE — Telephone Encounter (Signed)
Form is in RAa's look at folder

## 2013-01-23 NOTE — Telephone Encounter (Signed)
Done- pl pick up from my desk

## 2013-01-24 NOTE — Telephone Encounter (Signed)
Pt informed that forms were faxed to [facility] today.  Forms placed to be scanned in pt's chart.

## 2013-01-25 ENCOUNTER — Telehealth: Payer: Self-pay | Admitting: Pulmonary Disease

## 2013-01-25 NOTE — Telephone Encounter (Signed)
Called, spoke with pt.  States insurance co is requesting further medical records for her disability forms.  I directed her to call [employer] at [phone] as they handle disability forms and documents.  She verbalized understanding and voiced no further questions or concerns at this time.

## 2013-01-30 ENCOUNTER — Encounter (HOSPITAL_COMMUNITY)
Admission: RE | Admit: 2013-01-30 | Discharge: 2013-01-30 | Disposition: A | Discharge status: Home / Self Care | Payer: Medicare Other | Source: Ambulatory Visit | Attending: Pulmonary Disease | Admitting: Pulmonary Disease

## 2013-01-30 ENCOUNTER — Telehealth: Payer: Self-pay | Admitting: Pulmonary Disease

## 2013-01-30 DIAGNOSIS — I517 Cardiomegaly: Secondary | ICD-10-CM | POA: Insufficient documentation

## 2013-01-30 DIAGNOSIS — I7090 Unspecified atherosclerosis: Secondary | ICD-10-CM | POA: Diagnosis not present

## 2013-01-30 DIAGNOSIS — K7689 Other specified diseases of liver: Secondary | ICD-10-CM | POA: Insufficient documentation

## 2013-01-30 DIAGNOSIS — I2510 Atherosclerotic heart disease of native coronary artery without angina pectoris: Secondary | ICD-10-CM | POA: Insufficient documentation

## 2013-01-30 DIAGNOSIS — Z96649 Presence of unspecified artificial hip joint: Secondary | ICD-10-CM | POA: Insufficient documentation

## 2013-01-30 DIAGNOSIS — R911 Solitary pulmonary nodule: Secondary | ICD-10-CM | POA: Insufficient documentation

## 2013-01-30 LAB — GLUCOSE, CAPILLARY: Glucose-Capillary: 88 mg/dL (ref 70–99)

## 2013-01-30 MED ORDER — FLUDEOXYGLUCOSE F - 18 (FDG) INJECTION
21.1000 | Freq: Once | INTRAVENOUS | Status: AC | PRN
  Administered 2013-01-30: 21.1 via INTRAVENOUS

## 2013-01-30 NOTE — Telephone Encounter (Signed)
RA, please advise on PET scan results, thanks!

## 2013-01-30 NOTE — Telephone Encounter (Signed)
Nodule still present & did light up. Pl arrange Fu visit tomorrow 3/4 at 2.30 pm to discuss plan

## 2013-01-31 ENCOUNTER — Encounter: Payer: Self-pay | Admitting: Pulmonary Disease

## 2013-01-31 ENCOUNTER — Ambulatory Visit (INDEPENDENT_AMBULATORY_CARE_PROVIDER_SITE_OTHER): Payer: Medicare Other | Admitting: Pulmonary Disease

## 2013-01-31 VITALS — BP 140/82 | HR 110 | Temp 96.8°F | Ht 62.0 in | Wt 125.8 lb

## 2013-01-31 DIAGNOSIS — R911 Solitary pulmonary nodule: Secondary | ICD-10-CM | POA: Diagnosis not present

## 2013-01-31 DIAGNOSIS — J84112 Idiopathic pulmonary fibrosis: Secondary | ICD-10-CM

## 2013-01-31 NOTE — Assessment & Plan Note (Signed)
LUL nodule 13mm 12/13 Enlarging 18mm with cavitation - PET + is concerning for malignant neoplasm  Refer to multi disciplinary thoracic oncology group We discussed options including surgery vs radiation therapy. Although objectively, her lung function does make her a candidate for surgery, she is understandably hesitant given her life-threatening experience with hip surgery in December 13. We also discussed radiation therapy as an option, however she will need CT-guided needle biopsy prior. Risks and benefits of each of these procedures were discussed with the patient and her son in great detail for 30 minutes.

## 2013-01-31 NOTE — Progress Notes (Signed)
  Subjective:    Patient ID: [name] [name], female    DOB: [birth year], 72 y.o.   MRN: [id number]  HPI PCP- Lupe Carney   72 yo smoker without history of lung disease s/p elective total hip arthroplasty on 11/03/12, developed hypoxia on 12/8. CT angio was negative for PE but showed Ground-glass opacities and interstitial prominence throughout the lungs, most pronounced in the mid and upper lung zones. Differential considerations would include infectious or inflammatory pneumonitis or less likely edema.  13 mm anterior left upper lobe nodule with spiculated appearance  BNP 1378 - diuresed, but developed progressive hypoxic resp failure/ ARDS requiring mechanical ventilation. TTE - nml LVEF, no WMA. Intubated 12-10 -12/15. She was then transferred to [facility] and discharged after 5 days with home physical therapy on oxygen.  Labs showed persistent leukocytosis with white count up at 20.8. Recheck CBC of December 23, showed some improvement with white count of 17.9. On 1/2 WC nml  CXR showed low volumes with a peripheral and basilar predominant pattern of interstitial coarsening.  She has finally been able to quit smoking.  ONO 12/05/12 showed mild desaturation -only 12 mins < 88%   01/31/2013 Accompanied by son Sam She continues to improve and gain strength Labs - RA factor mild POS but CCP neg  ANA neg  WC down  Stable, off O2  PFTs - no obstruction, mild restriction, FVC 77%, DLCO decreased at 57%  Hoarseness improving- saw ENT & RLE edema resolved on lotrel, HCTZ   hypersens panel neg Venous duplex neg for DVT  PET scan showed Interval enlargement of a 1.8 x 1.3 cm spiculated hypermetabolic partially cavitary nodule in the anterior aspect of the left upper lobe, with the internal cavitation. resolution of previously noted ground glass attenuation, and increasing areas of peripheral  subpleural reticulation and even evidence of early honeycombing.  Overall, the pattern is favored to represent  evolving usual interstitial pneumonia (UIP).  She has not started pulmonary rehabilitation although she was contacted    Review of Systems neg for any significant sore throat, dysphagia, itching, sneezing, nasal congestion or excess/ purulent secretions, fever, chills, sweats, unintended wt loss, pleuritic or exertional cp, hempoptysis, orthopnea pnd or change in chronic leg swelling. Also denies presyncope, palpitations, heartburn, abdominal pain, nausea, vomiting, diarrhea or change in bowel or urinary habits, dysuria,hematuria, rash, arthralgias, visual complaints, headache, numbness weakness or ataxia.     Objective:   Physical Exam   Gen. Pleasant, well-nourished, in no distress, normal affect ENT - no lesions, no post nasal drip Neck: No JVD, no thyromegaly, no carotid bruits Lungs: no use of accessory muscles, no dullness to percussion, bibasal 1/3 rales, no rhonchi  Cardiovascular: Rhythm regular, heart sounds  normal, no murmurs or gallops, no peripheral edema Abdomen: soft and non-tender, no hepatosplenomegaly, BS normal. Musculoskeletal: No deformities, no cyanosis or clubbing Neuro:  alert, non focal        Assessment & Plan:

## 2013-01-31 NOTE — Assessment & Plan Note (Signed)
Her imaging now he appears to be more typical of usual interstitial pneumonia, which would be consistent with idiopathic pulmonary fibrosis. I would encourage her to start pulmonary rehabilitation. Her disability papers were filled out. Once it is clear which pathway she is  taking for her left upper lobe nodule, we will decide on starting perfenidone in the future.

## 2013-01-31 NOTE — Telephone Encounter (Signed)
Pt is aware of PET scan results and will be coming in today at 2:30 per RA to discuss further.

## 2013-01-31 NOTE — Patient Instructions (Addendum)
Refer to multi disciplinary thoracic oncology group We discussed options including surgery vs radiation therapy

## 2013-02-01 ENCOUNTER — Telehealth: Payer: Self-pay | Admitting: *Deleted

## 2013-02-01 DIAGNOSIS — M25519 Pain in unspecified shoulder: Secondary | ICD-10-CM | POA: Diagnosis not present

## 2013-02-01 NOTE — Telephone Encounter (Signed)
Spoke with pt regarding appt for [facility] 02/09/13 at 1:45 for surgery and/or Rad Onc.  Pt verbalized understanding of time and place of appt.  Spoke with referring office regarding appt for pt

## 2013-02-09 ENCOUNTER — Encounter: Payer: Self-pay | Admitting: *Deleted

## 2013-02-09 ENCOUNTER — Encounter: Payer: Self-pay | Admitting: Radiation Oncology

## 2013-02-09 ENCOUNTER — Encounter: Payer: Self-pay | Admitting: Cardiothoracic Surgery

## 2013-02-09 ENCOUNTER — Ambulatory Visit
Admission: RE | Admit: 2013-02-09 | Discharge: 2013-02-09 | Disposition: A | Discharge status: Home / Self Care | Payer: Medicare Other | Source: Ambulatory Visit | Attending: Radiation Oncology | Admitting: Radiation Oncology

## 2013-02-09 ENCOUNTER — Institutional Professional Consult (permissible substitution) (INDEPENDENT_AMBULATORY_CARE_PROVIDER_SITE_OTHER): Payer: Medicare Other | Admitting: Cardiothoracic Surgery

## 2013-02-09 VITALS — BP 152/81 | HR 115 | Temp 96.6°F | Resp 16 | Ht 62.0 in | Wt 125.0 lb

## 2013-02-09 DIAGNOSIS — C3410 Malignant neoplasm of upper lobe, unspecified bronchus or lung: Secondary | ICD-10-CM | POA: Diagnosis not present

## 2013-02-09 DIAGNOSIS — D381 Neoplasm of uncertain behavior of trachea, bronchus and lung: Secondary | ICD-10-CM

## 2013-02-09 DIAGNOSIS — J849 Interstitial pulmonary disease, unspecified: Secondary | ICD-10-CM

## 2013-02-09 DIAGNOSIS — J8410 Pulmonary fibrosis, unspecified: Secondary | ICD-10-CM | POA: Diagnosis not present

## 2013-02-09 DIAGNOSIS — R911 Solitary pulmonary nodule: Secondary | ICD-10-CM

## 2013-02-09 NOTE — Progress Notes (Signed)
Radiation Oncology         [phone]) [phone] ________________________________  Initial outpatient Consultation  Name: [name] [name] MRN: [id number]  Date: 02/09/2013  DOB: 20-Sep-1941  WG:[id number] [name] Saucer, MD  [name] Milch, MD   REFERRING PHYSICIAN: Oretha Milch, MD  DIAGNOSIS: The primary encounter diagnosis was Neoplasm of uncertain behavior of trachea, bronchus, and lung. Diagnoses of ILD (interstitial lung disease) and Pulmonary nodule were also pertinent to this visit.  HISTORY OF PRESENT ILLNESS::[name] [name] is a 72 y.o. female who is is seen out of the courtesy of Dr. Vassie [name] for evaluation as part of the multidisciplinary thoracic oncology clinic.  She is a 72 yo smoker without history of lung disease s/p elective total hip arthroplasty on 11/03/12, developed hypoxia on 12/8. CT angio was negative for PE but showed Ground-glass opacities and interstitial prominence throughout the lungs, most pronounced in the mid and upper lung zones. Differential considerations  included infectious or inflammatory pneumonitis or less likely edema.  Scan also showed 13 mm anterior left upper lobe nodule with spiculated appearance.  She  developed progressive hypoxic resp failure/ ARDS requiring mechanical ventilation.  Intubated 12-10 -12/15. She was then transferred to the ICU and discharged after 5 days with home physical therapy on oxygen.  She has been slowly improving since her problems in December of last year.  Patient began workup of her pulmonary nodule with a PET scan performed on 01/30/2013. This showed interval enlargement of the 1.8 x 1.3 cm spiculated nodule. There was hypermetabolic activity with partially cavitary nodule in the anterior aspect of the left upper lobe. This is concerning for primary lung neoplasm. The SUV for this lesion was 6.0. The patient was seen earlier today by Dr. Donata [name].  She was not felt to be a good candidate for surgical intervention given the above mentioned  problems. The patient also did not wish to undergo anesthesia in light of recent events. The patient is now seen in radiation oncology to be considered for definitive treatment.    PREVIOUS RADIATION THERAPY: No  PAST MEDICAL HISTORY:  has a past medical history of Hypertension; Arthritis; Hyperlipidemia; and Pulmonary nodule.    PAST SURGICAL HISTORY: Past Surgical History  Procedure Laterality Date  . Tonsillectomy    . Total hip arthroplasty  11/03/2012    Procedure: TOTAL HIP ARTHROPLASTY ANTERIOR APPROACH;  Surgeon: Kathryne Hitch, MD;  Location: [facility];  Service: Orthopedics;  Laterality: Right;  Right Total Hip Arthroplasty, Anterior Approach (C-Arm)  . Tubal ligation      FAMILY HISTORY: family history includes CAD in her father.  SOCIAL HISTORY:  reports that she quit smoking about 3 months ago. Her smoking use included Cigarettes. She has a 40 pack-year smoking history. She does not have any smokeless tobacco history on file. She reports that  drinks alcohol. She reports that she does not use illicit drugs.  ALLERGIES: Penicillins  MEDICATIONS:  Current Outpatient Prescriptions  Medication Sig Dispense Refill  . amLODipine-benazepril (LOTREL) 5-20 MG per capsule Take 1 capsule by mouth daily.      Marland Kitchen atorvastatin (LIPITOR) 20 MG tablet Take 10 mg by mouth daily before breakfast.       . Coenzyme Q10 (CO Q 10) 100 MG CAPS Take 300 mg by mouth at bedtime.      . hydrochlorothiazide (HYDRODIURIL) 25 MG tablet Take 25 mg by mouth daily.       No current facility-administered medications for this encounter.    REVIEW OF  SYSTEMS:  A 15 point review of systems is documented in the electronic medical record. This was obtained by the nursing staff. However, I reviewed this with the patient to discuss relevant findings and make appropriate changes. She continues to have some fatigue. She denies any pain in the left upper chest or significant cough. She denies any hemoptysis.      PHYSICAL EXAM:  height is 5\' 2"  (1.575 m) and weight is 125 lb (56.7 kg). Her oral temperature is 96.6 F (35.9 C). Her blood pressure is 152/81 and her pulse is 115. Her respiration is 16 and oxygen saturation is 96%.   BP 152/81  Pulse 115  Temp(Src) 96.6 F (35.9 C) (Oral)  Resp 16  Ht 5\' 2"  (1.575 m)  Wt 125 lb (56.7 kg)  BMI 22.86 kg/m2  SpO2 96%  General Appearance:    Alert, cooperative, no distress, appears stated age,  she is accompanied by her son and daughter on evaluation today   Head:    Normocephalic, without obvious abnormality, atraumatic  Eyes:    PERRL, conjunctiva/corneas clear, EOM's intact,             Neck:   Supple, symmetrical, trachea midline, no adenopathy;    thyroid:  no enlargement/tenderness/nodules; no carotid   bruit or JVD  Back:     Symmetric, no curvature, ROM normal, no CVA tenderness  Lungs:     Clear to auscultation bilaterally, respirations unlabored  Chest Wall:    No tenderness or deformity   Heart:    Regular rate and rhythm,, no murmur, rub   or gallop     Abdomen:   Soft, non-tender, bowel sounds active all four quadrants,    no masses, no organomegaly                         LABORATORY DATA:  Lab Results  Component Value Date   WBC 9.6 12/01/2012   HGB 10.7* 12/01/2012   HCT 31.4* 12/01/2012   MCV 93.3 12/01/2012   PLT 230.0 12/01/2012   Lab Results  Component Value Date   NA 141 11/18/2012   K 3.5 11/18/2012   CL 103 11/18/2012   CO2 25 11/18/2012   Lab Results  Component Value Date   ALT 23 11/17/2012   AST 25 11/17/2012   ALKPHOS 73 11/17/2012   BILITOT 0.7 11/17/2012     RADIOGRAPHY: Nm Pet Image Initial (pi) Skull Base To Thigh  01/30/2013  *RADIOLOGY REPORT*  Clinical Data: Initial pulmonary nodule treatment strategy for tumor type.  NUCLEAR MEDICINE PET SKULL BASE TO THIGH  Fasting Blood Glucose:  88  Technique:  21.1 mCi F-18 FDG was injected intravenously. CT data was obtained and used for attenuation  correction and anatomic localization only.  (This was not acquired as a diagnostic CT examination.) Additional exam technical data entered on technologist worksheet.  Comparison:  Chest CT 11/06/2012  Findings:  Neck: No hypermetabolic lymph nodes in the neck.  Chest:  In the anterior aspect of the left upper lobe (image 60 of series 2) there is a 1.8 x 1.3 cm nodule with spiculated margins and possible internal focus of cavitation (image 66 of series 2) which is hypermetabolic (SUVmax = 6.0).  Compared to the prior examination, the extensive ground-glass attenuation noted throughout the lungs bilaterally on the prior study has largely resolved.  There are persistent areas of subpleural reticulation with some areas suspicious for potential honeycombing (best demonstrated  in the periphery of the right upper lobe on image 61 of series 2).These lung findings have no definite craniocaudal gradient.  The heart is mildly enlarged and there is rounding of the left ventricular apex where there is hypometabolism of the myocardium, likely indicative of some scarring and remodelling admitted to prior LAD territory myocardial infarction. There is atherosclerosis of the thoracic aorta, the great vessels of the mediastinum and the coronary arteries, including calcified atherosclerotic plaque in the left main, left anterior descending, left circumflex and right coronary arteries.  No pathologically enlarged or hypermetabolic mediastinal or hilar lymph nodes.  Abdomen/Pelvis:  No abnormal hypermetabolic activity within the liver, pancreas, adrenal glands, or spleen.  No hypermetabolic lymph nodes in the abdomen or pelvis.  A subcentimeter low attenuation hepatic lesions in the right lobe of the liver have no associated hypermetabolism.  Extensive atherosclerosis throughout the abdominal and pelvic vasculature, without definite aneurysm.  Skeleton:  No suspicious appearing foci of skeletal hypermetabolism.  There is nonspecific  activity in and around the right glenohumeral joint, presumably degenerative.  Degenerative hypermetabolic activity is noted adjacent to multiple posterior costochondral junctions of the thorax.  There is also some activity around the right total hip prosthesis which is nonspecific.  IMPRESSION:  1.  Interval enlargement of a 1.8 x 1.3 cm spiculated hypermetabolic partially cavitary nodule in the anterior aspect of the left upper lobe, concerning for a primary lung neoplasm.  Given the internal cavitation, squamous cell neoplasm is favored. Alternatively, and less likely, this could be infectious in etiology.  Correlation with biopsy is recommended. 2.  The appearance of the lungs is compatible with an underlying interstitial lung disease, as above.  The pattern has significantly changed compared to the prior study, with resolution of previously noted ground glass attenuation, and increasing areas of peripheral subpleural reticulation and even evidence of early honeycombing. Overall, the pattern is favored to represent evolving usual interstitial pneumonia (UIP).  If the lesion in the left upper lobe undergoes surgical resection, correlation with open lung biopsy findings would provide additional diagnostic information if clinically indicated. 3. Atherosclerosis, including left main and three-vessel coronary artery disease. Please note that although the presence of coronary artery calcium documents the presence of coronary artery disease, the severity of this disease and any potential stenosis cannot be assessed on this non-gated CT examination.  Assessment for potential risk factor modification, dietary therapy or pharmacologic therapy may be warranted, if clinically indicated. 4.  Additional findings, as above.   Original Report Authenticated By: Trudie Reed, M.D.       IMPRESSION: Probable stage I non-small cell lung cancer.  As above the patient is not felt to be a good candidate for surgery. She will  require a tissue diagnosis.  She and her family have agreed to proceed with a biopsy of the left upper lung lesion. In addition to complete staging workup the patient will proceed with MRI the brain. The patient would appear to be a good candidate for SBRT.   PLAN: CT-guided biopsy of the left upper lung lesion if deemed a safe procedure by interventional radiology. The patient will also proceed with MRI the brain. The patient and her family are inquiring about a second opinion at [facility] and we will also arrange this consultation .                                                                                                               -----------------------------------  Blair Promise, PhD, MD

## 2013-02-09 NOTE — Progress Notes (Signed)
PCP is Benita Stabile, MD Referring Provider is Oretha Milch, MD  Chief Complaint  Patient presents with  . Lung Lesion    referred to [facility] for eval and treament    HPI: 72 year old Caucasian female reformed smoker since December who presents for evaluation of a recently diagnosed 2 cm left upper lobe-lingular nodule with hyper metabolic activity on PET scan. This was an incidental finding on CT scan performed when she developed ARDS and ventilator dependent respiratory failure after a total hip replacement in December 2014. She denies prior history of significant lung disease but she had never had general anesthesia or a major operation previously. No pulmonary l emboli were identified by the CT scan. 2-D echocardiogram showed EF 65% without valvular disease or significant pericardial effusion. She responded to antibiotics and pressors . She received inpatient rehabilitation and now is living at home. She is not recovered her weight loss completely but she states her exercise tolerance is fairly close to baseline. She stopped smoking completely since the hospitalization.  PET scan showed no evidence of abnormal mediastinal activity or distant metastatic disease. A brain MRI is pending. She has not undergone bronchoscopy or biopsy. We function tests demonstrated an FEV1 1.8 FVC 2.6 DLCO 57% of predicted the primary point nodule had an SUV of 6.0 on PET scan.  Past Medical History  Diagnosis Date  . Hypertension   . Arthritis     a. 10/2012 s/p Right THA.  Marland Kitchen Hyperlipidemia   . Pulmonary nodule     a. 10/2012 CT: 13mm anterior LUL nodule w/ spiculated appearance - rec PET CT.    Past Surgical History  Procedure Laterality Date  . Tonsillectomy    . Total hip arthroplasty  11/03/2012    Procedure: TOTAL HIP ARTHROPLASTY ANTERIOR APPROACH;  Surgeon: Kathryne Hitch, MD;  Location: [facility];  Service: Orthopedics;  Laterality: Right;  Right Total Hip Arthroplasty, Anterior Approach  (C-Arm)  . Tubal ligation      Family History  Problem Relation Age of Onset  . CAD Father     Social History History  Substance Use Topics  . Smoking status: Former Smoker -- 1.00 packs/day for 40 years    Types: Cigarettes    Quit date: 11/03/2012  . Smokeless tobacco: Not on file  . Alcohol Use: Yes     Comment: 2-3 drinks of liquor nightly    Current Outpatient Prescriptions  Medication Sig Dispense Refill  . amLODipine-benazepril (LOTREL) 5-20 MG per capsule Take 1 capsule by mouth daily.      Marland Kitchen atorvastatin (LIPITOR) 20 MG tablet Take 10 mg by mouth daily before breakfast.       . Coenzyme Q10 (CO Q 10) 100 MG CAPS Take 300 mg by mouth at bedtime.      . hydrochlorothiazide (HYDRODIURIL) 25 MG tablet Take 25 mg by mouth daily.       No current facility-administered medications for this visit.    Allergies  Allergen Reactions  . Penicillins Hives and Swelling    Review of Systems Gen. she is recovered from her in late dependent respiratory failure in December following hip replacement and is angulating well HEENT still has residual hoarseness from prolonged intubation but this is improving no signs of aspiration Thorax no prior exposure to asbestos or fiber particles, no history thoracic trauma or previous pneumonia Cardiac normal 2-D echo in December 2014 no history of angina or MI GI no history of cirrhosis or jaundice Hematologic no bleeding disorder  she received one blood transfusion following hip replacement Neurologic no history stroke or seizure Vascular no postop DVT  BP 152/81  Pulse 115  Temp(Src) 96.6 F (35.9 C) (Oral)  Resp 16  Ht 5\' 2"  (1.575 m)  Wt 125 lb (56.7 kg)  BMI 22.86 kg/m2  SpO2 96% Physical Exam   Diagnostic Tests: CT scan, PFTs, PET scan, 2-D echocardiogram all reviewed  Impression: 72 year old reformed smoker with COPD and recently diagnosed 1.5-2 cm lingular nodule with hypermetabolic activity on PET scan consistent with  primary bronchogenic carcinoma without mediastinal or distant involvement.  Would recommend against surgery this patient developed significant ARDS following hip replacement and would not tolerate thoracotomy.  Recommend CT directed needle biopsy to establish diagnosis then radiation therapy with SBRT. Needs brain MRI to complete clinical staging. We'll be available for any thoracic surgical issues during the course of treatment.

## 2013-02-09 NOTE — Progress Notes (Signed)
Spoke with pt, son and daughter at [facility] today.  Pt would like second opinion.  I will notify [facility] to help with referral.

## 2013-02-10 ENCOUNTER — Telehealth: Payer: Self-pay | Admitting: *Deleted

## 2013-02-10 ENCOUNTER — Encounter: Payer: Self-pay | Admitting: *Deleted

## 2013-02-10 NOTE — Telephone Encounter (Signed)
Spoke with Dr. Roselind Messier regarding pt request to be tx by Dr. Kathrynn Running.  He stated he will set up appt.  Son notified.

## 2013-02-10 NOTE — Telephone Encounter (Signed)
Left voicemail message today.  Pt is wanting Dr. Kathrynn Running for Rad Onc.  I have contacted Dr. Roselind Messier to help with referral.  I will update family as needed.

## 2013-02-10 NOTE — Telephone Encounter (Signed)
CALLED PATIENT TO INFORM OF TEST, LVM FOR A RETURN CALL 

## 2013-02-13 ENCOUNTER — Encounter (HOSPITAL_COMMUNITY): Payer: Self-pay | Admitting: Pharmacy Technician

## 2013-02-13 ENCOUNTER — Other Ambulatory Visit: Payer: Self-pay | Admitting: Radiology

## 2013-02-14 ENCOUNTER — Telehealth: Payer: Self-pay | Admitting: *Deleted

## 2013-02-14 ENCOUNTER — Ambulatory Visit (HOSPITAL_COMMUNITY)
Admission: RE | Admit: 2013-02-14 | Discharge: 2013-02-14 | Disposition: A | Discharge status: Home / Self Care | Payer: Medicare Other | Source: Ambulatory Visit | Attending: Radiation Oncology | Admitting: Radiation Oncology

## 2013-02-14 DIAGNOSIS — G9389 Other specified disorders of brain: Secondary | ICD-10-CM | POA: Diagnosis not present

## 2013-02-14 DIAGNOSIS — R911 Solitary pulmonary nodule: Secondary | ICD-10-CM

## 2013-02-14 DIAGNOSIS — C3490 Malignant neoplasm of unspecified part of unspecified bronchus or lung: Secondary | ICD-10-CM | POA: Diagnosis not present

## 2013-02-14 MED ORDER — GADOBENATE DIMEGLUMINE 529 MG/ML IV SOLN
15.0000 mL | Freq: Once | INTRAVENOUS | Status: AC | PRN
  Administered 2013-02-14: 11 mL via INTRAVENOUS

## 2013-02-14 NOTE — Telephone Encounter (Signed)
Spoke with patient in follow up to [facility] 02/09/13.  Reviewed scheduled appointments.  Patient reports that she is seeing Dr. Kathrynn Running, Radiation Oncology 02/20/13.  Patient asked about lab work to be done today prior to MRI and we discussed purpose for recent tests.  Patient verbalized understanding and denied further questions.  Patient was given contact information and instructed to call if she had any questions or concerns.

## 2013-02-15 ENCOUNTER — Other Ambulatory Visit: Payer: Self-pay | Admitting: Radiology

## 2013-02-16 ENCOUNTER — Ambulatory Visit (HOSPITAL_COMMUNITY)
Admission: RE | Admit: 2013-02-16 | Discharge: 2013-02-16 | Disposition: A | Discharge status: Home / Self Care | Payer: Medicare Other | Source: Ambulatory Visit | Attending: Interventional Radiology | Admitting: Interventional Radiology

## 2013-02-16 ENCOUNTER — Ambulatory Visit (HOSPITAL_COMMUNITY)
Admission: RE | Admit: 2013-02-16 | Discharge: 2013-02-16 | Disposition: A | Discharge status: Home / Self Care | Payer: Medicare Other | Source: Ambulatory Visit | Attending: Radiation Oncology | Admitting: Radiation Oncology

## 2013-02-16 ENCOUNTER — Encounter (HOSPITAL_COMMUNITY): Payer: Self-pay

## 2013-02-16 DIAGNOSIS — C3410 Malignant neoplasm of upper lobe, unspecified bronchus or lung: Secondary | ICD-10-CM | POA: Insufficient documentation

## 2013-02-16 DIAGNOSIS — I10 Essential (primary) hypertension: Secondary | ICD-10-CM | POA: Diagnosis not present

## 2013-02-16 DIAGNOSIS — C3490 Malignant neoplasm of unspecified part of unspecified bronchus or lung: Secondary | ICD-10-CM

## 2013-02-16 DIAGNOSIS — C3412 Malignant neoplasm of upper lobe, left bronchus or lung: Secondary | ICD-10-CM | POA: Insufficient documentation

## 2013-02-16 DIAGNOSIS — E785 Hyperlipidemia, unspecified: Secondary | ICD-10-CM | POA: Diagnosis not present

## 2013-02-16 DIAGNOSIS — R911 Solitary pulmonary nodule: Secondary | ICD-10-CM

## 2013-02-16 DIAGNOSIS — J8410 Pulmonary fibrosis, unspecified: Secondary | ICD-10-CM | POA: Diagnosis not present

## 2013-02-16 DIAGNOSIS — J438 Other emphysema: Secondary | ICD-10-CM | POA: Diagnosis not present

## 2013-02-16 HISTORY — PX: LUNG BIOPSY: SHX232

## 2013-02-16 HISTORY — DX: Malignant neoplasm of unspecified part of unspecified bronchus or lung: C34.90

## 2013-02-16 LAB — CBC
HCT: 39.4 % (ref 36.0–46.0)
Hemoglobin: 13.3 g/dL (ref 12.0–15.0)
MCH: 31.9 pg (ref 26.0–34.0)
MCHC: 33.8 g/dL (ref 30.0–36.0)

## 2013-02-16 LAB — PROTIME-INR: INR: 0.93 (ref 0.00–1.49)

## 2013-02-16 MED ORDER — SODIUM CHLORIDE 0.9 % IV SOLN
Freq: Once | INTRAVENOUS | Status: AC
  Administered 2013-02-16: 10:00:00 via INTRAVENOUS

## 2013-02-16 MED ORDER — MIDAZOLAM HCL 2 MG/2ML IJ SOLN
INTRAMUSCULAR | Status: AC
  Filled 2013-02-16: qty 6

## 2013-02-16 MED ORDER — FENTANYL CITRATE 0.05 MG/ML IJ SOLN
INTRAMUSCULAR | Status: AC | PRN
  Administered 2013-02-16: 50 ug via INTRAVENOUS
  Administered 2013-02-16 (×2): 25 ug via INTRAVENOUS

## 2013-02-16 MED ORDER — MIDAZOLAM HCL 2 MG/2ML IJ SOLN
INTRAMUSCULAR | Status: AC | PRN
  Administered 2013-02-16 (×3): 0.5 mg via INTRAVENOUS

## 2013-02-16 MED ORDER — FENTANYL CITRATE 0.05 MG/ML IJ SOLN
INTRAMUSCULAR | Status: AC
  Filled 2013-02-16: qty 6

## 2013-02-16 MED ORDER — HYDROCODONE-ACETAMINOPHEN 5-325 MG PO TABS
1.0000 | ORAL_TABLET | ORAL | Status: DC | PRN
  Filled 2013-02-16: qty 2

## 2013-02-16 NOTE — H&P (Signed)
Chief Complaint: "I'm here for a lung biopsy" Referring Physician:Kinard, Kathrynn Running HPI: [name] [name] is an 72 y.o. female with newly found neoplasm of the lung. She has had a CT?PET showing left sided lung mass. She is referred to IR for perc biopsy to obtain tissue diagnosis. PMHx and meds reviewed.  Past Medical History:  Past Medical History  Diagnosis Date  . Hypertension   . Arthritis     a. 10/2012 s/p Right THA.  Marland Kitchen Hyperlipidemia   . Pulmonary nodule     a. 10/2012 CT: 13mm anterior LUL nodule w/ spiculated appearance - rec PET CT.    Past Surgical History:  Past Surgical History  Procedure Laterality Date  . Tonsillectomy    . Total hip arthroplasty  11/03/2012    Procedure: TOTAL HIP ARTHROPLASTY ANTERIOR APPROACH;  Surgeon: Kathryne Hitch, MD;  Location: [facility];  Service: Orthopedics;  Laterality: Right;  Right Total Hip Arthroplasty, Anterior Approach (C-Arm)  . Tubal ligation      Family History:  Family History  Problem Relation Age of Onset  . CAD Father     Social History:  reports that she quit smoking about 3 months ago. Her smoking use included Cigarettes. She has a 40 pack-year smoking history. She does not have any smokeless tobacco history on file. She reports that  drinks alcohol. She reports that she does not use illicit drugs.  Allergies:  Allergies  Allergen Reactions  . Penicillins Hives and Swelling    Medications: amLODipine-benazepril (LOTREL) 5-20 MG per capsule (Taking) Sig - Route: Take 1 capsule by mouth daily before breakfast. - Oral Class: Historical Med Number of times this order has been changed since signing: 2 Order [address] atorvastatin (LIPITOR) 20 MG tablet (Taking) Sig - Route: Take 20 mg by mouth daily before breakfast. - Oral Class: Historical Med Number of times this order has been changed since signing: 4 Order [address] Coenzyme Q10 (CO Q 10) 100 MG CAPS (Taking) Sig - Route: Take 100 mg by mouth at bedtime. - Oral  Class: Historical Med Number of times this order has been changed since signing: 2 Order [address] hydrochlorothiazide (HYDRODIURIL) 25 MG tablet (Taking) Sig - Route: Take 25 mg by mouth daily before breakfast. - Oral Class: Historical Med Number of times this order has been changed s   Please HPI for pertinent positives, otherwise complete 10 system ROS negative.  Physical Exam: Blood pressure 124/66, pulse 65, temperature 97 F (36.1 C), temperature source Oral, resp. rate 16, height 5\' 2"  (1.575 m), weight 125 lb (56.7 kg), SpO2 99.00%. Body mass index is 22.86 kg/(m^2).   General Appearance:  Alert, cooperative, no distress, appears stated age  Head:  Normocephalic, without obvious abnormality, atraumatic  ENT: Unremarkable  Neck: Supple, symmetrical, trachea midline, no adenopathy, thyroid: not enlarged, symmetric, no tenderness/mass/nodules  Lungs:   Clear to auscultation bilaterally, no w/r/r, respirations unlabored without use of accessory muscles.  Chest Wall:  No tenderness or deformity  Heart:  Regular rate and rhythm, S1, S2 normal, no murmur, rub or gallop. Carotids 2+ without bruit.  Neurologic: Normal affect, no gross deficits.   Results for orders placed during the hospital encounter of 02/16/13 (from the past 48 hour(s))  CBC     Status: None   Collection Time    02/16/13  9:50 AM      Result Value Range   WBC 6.2  4.0 - 10.5 K/uL   RBC 4.17  3.87 - 5.11  MIL/uL   Hemoglobin 13.3  12.0 - 15.0 g/dL   HCT 16.1  09.6 - 04.5 %   MCV 94.5  78.0 - 100.0 fL   MCH 31.9  26.0 - 34.0 pg   MCHC 33.8  30.0 - 36.0 g/dL   RDW 40.9  81.1 - 91.4 %   Platelets 263  150 - 400 K/uL   Mr Laqueta Jean Wo Contrast  02/14/2013  *RADIOLOGY REPORT*  Clinical Data: 72 year old female with non-small cell lung cancer diagnosed December 2013.  Staging.  MRI HEAD WITHOUT AND WITH CONTRAST  Technique:  Multiplanar, multiecho pulse sequences of the brain and surrounding structures were obtained  according to standard protocol without and with intravenous contrast  Contrast: 11mL MULTIHANCE GADOBENATE DIMEGLUMINE 529 MG/ML IV SOLN  Comparison: None.  Findings: Along the anterior superior left frontal bone near midline there is a benign vascular lesion of the bone which might have some vascular communication with the superior sagittal sinus. This has intrinsic T1 hyperintensity and lacks any abnormal diffusion signal.  Other visualized bone marrow signal is within normal limits; some decreased T1 signal in the cervical spine likely is degenerative.  No abnormal enhancement of the brain is identified. No midline shift, mass effect, or evidence of mass lesion.  Scattered nonspecific cerebral white matter T2 and FLAIR hyperintensity is noted, with no areas suggestive of cerebral edema.  No diffusion abnormality or restricted diffusion to suggest acute infarction.  No acute intracranial hemorrhage identified.  No ventriculomegaly. Negative pituitary.  Negative cervicomedullary junction.  Negative visualized cervical spine except for degenerative changes. Major intracranial vascular flow voids are preserved, dominant distal left vertebral artery.  Tortuous left supraclinoid ICA.  Visualized orbit soft tissues are within normal limits.  Visualized paranasal sinuses and mastoids are clear.  Negative visualized internal auditory structures.  Negative scalp soft tissues.  IMPRESSION: 1. No acute or metastatic intracranial abnormality. 2.  Mild for age nonspecific cerebral white matter signal changes. 3.  Incidental left frontal bone hemangioma or developmental venous anomaly.   Original Report Authenticated By: Erskine Speed, M.D.     Assessment/Plan Left lung mass For CT guided biopsy today. Discussed procedure, risks, complications such as infection, bleeding, PTX requiring chest and possible admission. Labs pending Consent signed in chart  Brayton El PA-C 02/16/2013, 10:28 AM

## 2013-02-16 NOTE — Procedures (Signed)
Interventional Radiology Procedure Note  Procedure: CT guided biopsy of left upper lobe pulmonary nodule Complications: No immediate Recommendations: - Bedrest until CXR cleared.  Minimize talking, coughing or otherwise straining.  - Follow up 2 hr CXR pending  - Sips and chips only until CXR clear  Signed,  Sterling Big, MD Vascular & Interventional Radiologist [facility]

## 2013-02-16 NOTE — H&P (Signed)
Agree with PA note.    Signed,  Heath K. McCullough, MD Vascular & Interventional Radiologist [facility]  

## 2013-02-20 ENCOUNTER — Encounter: Payer: Self-pay | Admitting: *Deleted

## 2013-02-20 ENCOUNTER — Encounter: Payer: Self-pay | Admitting: Radiation Oncology

## 2013-02-20 NOTE — Progress Notes (Signed)
Spoke with pt today.  Gave her an appt for 02/22/13 at 7:30 am.  Pt verbalized understanding of time and place of appt

## 2013-02-20 NOTE — Progress Notes (Signed)
Follow up Lung clinic seen by Dr.kinard 02/09/13 02/16/13 LUL lung bx=Non-Small Cell Ca  Patient alert,oriented x3 , no c/o sob.nausea, no fatigue, no coughing, slight anxious,   No History Radiation No history of a Pacemaker   Allergies: PCN

## 2013-02-21 ENCOUNTER — Telehealth: Payer: Self-pay | Admitting: *Deleted

## 2013-02-21 ENCOUNTER — Encounter: Payer: Self-pay | Admitting: Radiation Oncology

## 2013-02-21 NOTE — Assessment & Plan Note (Signed)
The patient met with thoracic surgery and was not felt to be an ideal surgical candidate. Therefore, she is under consideration for stereotactic body radiotherapy.

## 2013-02-21 NOTE — Telephone Encounter (Signed)
Called patient to verify she understood about scheduled appointments 02/22/13 and to see if she had any questions.  Patient did not answer and message left about appointment in AM.

## 2013-02-21 NOTE — Progress Notes (Signed)
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 female who underwent elective total hip arthroplasty on 11/03/12.  Postoperatively, she developed hypoxia on 12/8. CT angio was negative for PE but showed Ground-glass opacities and interstitial prominence throughout the lungs, most pronounced in the mid and upper lung zones. Differential considerations included infectious or inflammatory pneumonitis or less likely edema. That scan also showed an incidental 13 mm anterior left upper lobe nodule with spiculated appearance. She developed progressive hypoxic resp failure/ ARDS requiring mechanical ventilation. Intubated 12-10 -12/15. She was then transferred to the ICU and discharged after 5 days with home physical therapy on oxygen. She has been slowly improving since her problems in December of last year. Patient began workup of her pulmonary nodule with a PET scan performed on 01/30/2013. This showed interval enlargement of the 1.8 x 1.3 cm spiculated nodule. There was hypermetabolic activity with partially cavitary nodule in the anterior aspect of the left upper lobe. This is concerning for primary lung neoplasm. The SUV for this lesion was 6.0. The patient was seen earlier today by Dr. Donata Clay. She was not felt to be a good candidate for surgical intervention given the above mentioned problems. The patient also did not wish to undergo anesthesia in light of recent events. The patient is now seen in radiation oncology to be considered for definitive treatment.  PREVIOUS RADIATION THERAPY: No  PAST MEDICAL HISTORY:  has a  past medical history of Hypertension; Arthritis; Hyperlipidemia; Pulmonary nodule; Allergy; History of ARDS; and Lung cancer (02/16/13).    PAST SURGICAL HISTORY: Past Surgical History  Procedure Laterality Date  . Tonsillectomy    . Total hip arthroplasty  11/03/2012    Procedure: TOTAL HIP ARTHROPLASTY ANTERIOR APPROACH;  Surgeon: Kathryne Hitch, MD;  Location: [facility];  Service: Orthopedics;  Laterality: Right;  Right Total Hip Arthroplasty, Anterior Approach (C-Arm)  . Tubal ligation    . Lung biopsy Left 02/16/13    LUL =non small cell ca with assoc/necrosis    FAMILY HISTORY: family history includes CAD in her father.  SOCIAL HISTORY:  reports that she quit smoking about 3 months ago. Her smoking use included Cigarettes. She has a 40 pack-year smoking history. She does not have any smokeless tobacco history on file. She reports that  drinks alcohol. She reports that she does not use illicit drugs.  ALLERGIES: Penicillins  MEDICATIONS:  Current Outpatient Prescriptions  Medication Sig Dispense Refill  . acetaminophen (TYLENOL) 500 MG tablet Take 1,000 mg by mouth every 6 (six) hours as needed for pain.      Marland Kitchen amLODipine-benazepril (LOTREL) 5-20 MG per capsule Take 1 capsule by mouth daily before breakfast.       . atorvastatin (LIPITOR) 20 MG tablet Take 20 mg by mouth daily before breakfast.       . Coenzyme Q10 (CO Q 10) 100 MG CAPS Take 100 mg by mouth at bedtime.       . hydrochlorothiazide (HYDRODIURIL) 25 MG tablet Take 25 mg by mouth daily before breakfast.       . ibuprofen (ADVIL,MOTRIN) 200 MG tablet Take 400  mg by mouth every 6 (six) hours as needed for pain.      . Multiple Vitamin (MULTIVITAMIN WITH MINERALS) TABS Take 1 tablet by mouth daily.       No current facility-administered medications for this encounter.    REVIEW OF SYSTEMS:  A 15 point review of systems is documented in the electronic medical record. This was obtained by the nursing staff. However, I  reviewed this with the patient to discuss relevant findings and make appropriate changes.  A comprehensive review of systems was negative.   PHYSICAL EXAM:  vitals were not taken for this visit.  The patient was in no acute distress today and breathing comfortably.  LABORATORY DATA:  Lab Results  Component Value Date   WBC 6.2 02/16/2013   HGB 13.3 02/16/2013   HCT 39.4 02/16/2013   MCV 94.5 02/16/2013   PLT 263 02/16/2013   Lab Results  Component Value Date   NA 141 11/18/2012   K 3.5 11/18/2012   CL 103 11/18/2012   CO2 25 11/18/2012   Lab Results  Component Value Date   ALT 23 11/17/2012   AST 25 11/17/2012   ALKPHOS 73 11/17/2012   BILITOT 0.7 11/17/2012     RADIOGRAPHY: Dg Chest 1 View  02/16/2013  *RADIOLOGY REPORT*  Clinical Data: Evaluate for pneumothorax status post left upper lobe lung biopsy  CHEST - 1 VIEW  Comparison: Prior chest x-ray 12/01/2012  Findings: No evidence of pneumothorax status post lung biopsy.  The left upper lobe pulmonary nodule is superimposed over the descending thoracic aortic shadow.  No large pleural effusion. Stable chronic peripheral fibrotic changes and emphysema.  Cardiac and mediastinal contours are unchanged.  There is aortic atherosclerosis.  IMPRESSION: No evidence of left-sided pneumothorax status post lung biopsy.   Original Report Authenticated By: Malachy Moan, M.D.    Mr Laqueta Jean WU Contrast  02/14/2013  *RADIOLOGY REPORT*  Clinical Data: 72 year old female with non-small cell lung cancer diagnosed December 2013.  Staging.  MRI HEAD WITHOUT AND WITH CONTRAST  Technique:  Multiplanar, multiecho pulse sequences of the brain and surrounding structures were obtained according to standard protocol without and with intravenous contrast  Contrast: 11mL MULTIHANCE GADOBENATE DIMEGLUMINE 529 MG/ML IV SOLN  Comparison: None.  Findings: Along the anterior superior left frontal bone near midline there is a benign vascular lesion of the bone which  might have some vascular communication with the superior sagittal sinus. This has intrinsic T1 hyperintensity and lacks any abnormal diffusion signal.  Other visualized bone marrow signal is within normal limits; some decreased T1 signal in the cervical spine likely is degenerative.  No abnormal enhancement of the brain is identified. No midline shift, mass effect, or evidence of mass lesion.  Scattered nonspecific cerebral white matter T2 and FLAIR hyperintensity is noted, with no areas suggestive of cerebral edema.  No diffusion abnormality or restricted diffusion to suggest acute infarction.  No acute intracranial hemorrhage identified.  No ventriculomegaly. Negative pituitary.  Negative cervicomedullary junction.  Negative visualized cervical spine except for degenerative changes. Major intracranial vascular flow voids are preserved, dominant distal left vertebral artery.  Tortuous left supraclinoid ICA.  Visualized orbit soft tissues are within normal limits.  Visualized paranasal sinuses and mastoids are clear.  Negative visualized internal auditory structures.  Negative scalp soft tissues.  IMPRESSION: 1. No acute or metastatic intracranial abnormality. 2.  Mild for age nonspecific cerebral white matter signal changes. 3.  Incidental left frontal bone hemangioma or developmental venous anomaly.  Original Report Authenticated By: Erskine Speed, M.D.    Nm Pet Image Initial (pi) Skull Base To Thigh  01/30/2013  *RADIOLOGY REPORT*  Clinical Data: Initial pulmonary nodule treatment strategy for tumor type.  NUCLEAR MEDICINE PET SKULL BASE TO THIGH  Fasting Blood Glucose:  88  Technique:  21.1 mCi F-18 FDG was injected intravenously. CT data was obtained and used for attenuation correction and anatomic localization only.  (This was not acquired as a diagnostic CT examination.) Additional exam technical data entered on technologist worksheet.  Comparison:  Chest CT 11/06/2012  Findings:  Neck: No hypermetabolic  lymph nodes in the neck.  Chest:  In the anterior aspect of the left upper lobe (image 60 of series 2) there is a 1.8 x 1.3 cm nodule with spiculated margins and possible internal focus of cavitation (image 66 of series 2) which is hypermetabolic (SUVmax = 6.0).  Compared to the prior examination, the extensive ground-glass attenuation noted throughout the lungs bilaterally on the prior study has largely resolved.  There are persistent areas of subpleural reticulation with some areas suspicious for potential honeycombing (best demonstrated in the periphery of the right upper lobe on image 61 of series 2).These lung findings have no definite craniocaudal gradient.  The heart is mildly enlarged and there is rounding of the left ventricular apex where there is hypometabolism of the myocardium, likely indicative of some scarring and remodelling admitted to prior LAD territory myocardial infarction. There is atherosclerosis of the thoracic aorta, the great vessels of the mediastinum and the coronary arteries, including calcified atherosclerotic plaque in the left main, left anterior descending, left circumflex and right coronary arteries.  No pathologically enlarged or hypermetabolic mediastinal or hilar lymph nodes.  Abdomen/Pelvis:  No abnormal hypermetabolic activity within the liver, pancreas, adrenal glands, or spleen.  No hypermetabolic lymph nodes in the abdomen or pelvis.  A subcentimeter low attenuation hepatic lesions in the right lobe of the liver have no associated hypermetabolism.  Extensive atherosclerosis throughout the abdominal and pelvic vasculature, without definite aneurysm.  Skeleton:  No suspicious appearing foci of skeletal hypermetabolism.  There is nonspecific activity in and around the right glenohumeral joint, presumably degenerative.  Degenerative hypermetabolic activity is noted adjacent to multiple posterior costochondral junctions of the thorax.  There is also some activity around the right  total hip prosthesis which is nonspecific.  IMPRESSION:  1.  Interval enlargement of a 1.8 x 1.3 cm spiculated hypermetabolic partially cavitary nodule in the anterior aspect of the left upper lobe, concerning for a primary lung neoplasm.  Given the internal cavitation, squamous cell neoplasm is favored. Alternatively, and less likely, this could be infectious in etiology.  Correlation with biopsy is recommended. 2.  The appearance of the lungs is compatible with an underlying interstitial lung disease, as above.  The pattern has significantly changed compared to the prior study, with resolution of previously noted ground glass attenuation, and increasing areas of peripheral subpleural reticulation and even evidence of early honeycombing. Overall, the pattern is favored to represent evolving usual interstitial pneumonia (UIP).  If the lesion in the left upper lobe undergoes surgical resection, correlation with open lung biopsy findings would provide additional diagnostic information if clinically indicated. 3. Atherosclerosis, including left main and three-vessel coronary artery disease. Please note that although the presence of coronary artery calcium documents the presence of coronary artery disease, the severity of this disease and any potential stenosis cannot be assessed on this non-gated CT examination.  Assessment for potential risk  factor modification, dietary therapy or pharmacologic therapy may be warranted, if clinically indicated. 4.  Additional findings, as above.   Original Report Authenticated By: Trudie Reed, M.D.    Ct Biopsy  02/16/2013  *RADIOLOGY REPORT*  CT BIOPSY  Date: 02/16/2013  Clinical History: 72 year old female with a PET positive metabolically active and mildly centrally cavitary nodule in the anterior aspect of the left upper lobe.  The findings are concerning for a primary bronchogenic carcinoma, potentially squamous cell given the underlying cavitation.  CT guided biopsy is  requested to facilitate tissue diagnosis.  Procedures Performed: 1. CT guided core biopsy  Interventional Radiologist:  Sterling Big, MD  Sedation: Moderate (conscious) sedation was used.  1.5 mg Versed, 100 mcg Fentanyl were administered intravenously.  The patient's vital signs were monitored continuously by radiology nursing throughout the procedure.  Sedation Time: 13 minutes  PROCEDURE/FINDINGS:   Informed consent was obtained from the patient following explanation of the procedure, risks, benefits and alternatives. The patient understands, agrees and consents for the procedure. All questions were addressed. A time out was performed.  Maximal barrier sterile technique utilized including caps, mask, sterile gowns, sterile gloves, large sterile drape, hand hygiene, and betadine skin prep.  A planning axial CT scan was performed.  The left upper lobe essentially cavitary pulmonary nodule was successfully identified. A suitable skin entry site was selected and marked.  Local anesthesia was achieved by infiltration of 1% lidocaine.  Using CT fluoroscopic guidance, a 17 gauge trocar needle was carefully advanced through the anterior chest wall and into the periphery of the lesion.  Multiple 18 gauge core biopsies were then obtained using the 18 gauge Biopince automated biopsy device.  Confirmation of the needle tip was confirmed multiple times with fluoroscopic spot imaging.  A total of 10 ml of peripheral blood was then drawn from the patients IV and a parenchymal blood patch was applied as the trocar needle was removed.  Post biopsy axial CT imaging demonstrates no evidence of significant hemorrhage, or immediate pneumothorax.  The patient tolerated the procedure very well, there is no immediate complication.  IMPRESSION:  Technically successful CT-guided biopsy of left upper lobe pulmonary nodule with application of a parenchymal blood patch for pneumothorax Prophylaxis.  Signed,  Sterling Big, MD  Vascular & Interventional Radiologist [facility] Rad[facility]   Original Report Authenticated By: Malachy Moan, M.D.       IMPRESSION: Ms. Conaty is a very nice 72 year old woman with an early stage left upper lung squamous cell carcinoma. She's not ideal surgical candidate. Her tumor appears to be amenable to stereotactic body radiotherapy.  PLAN:Today, I talked to the patient and family about the findings and work-up thus far.  We discussed the natural history of disease and general treatment, highlighting the role or radiotherapy in the management.  We discussed the available radiation techniques, and focused on the details of logistics and delivery.  We reviewed the anticipated acute and late sequelae associated with radiation in this setting.  The patient was encouraged to ask questions that I answered to the best of my ability.  I filled out a patient counseling form during our discussion including treatment diagrams.  We retained a copy for our records.  The patient would like to proceed with radiation and will be scheduled for CT simulation.  I spent 60 minutes minutes face to face with the patient and more than 50% of that time was spent in counseling and/or coordination of care.   ------------------------------------------------  Molli Hazard  Kristian Covey, M.D.

## 2013-02-22 ENCOUNTER — Ambulatory Visit
Admission: RE | Admit: 2013-02-22 | Discharge: 2013-02-22 | Disposition: A | Discharge status: Home / Self Care | Payer: Medicare Other | Source: Ambulatory Visit | Attending: Radiation Oncology | Admitting: Radiation Oncology

## 2013-02-22 ENCOUNTER — Telehealth: Payer: Self-pay | Admitting: Radiation Oncology

## 2013-02-22 ENCOUNTER — Encounter: Payer: Self-pay | Admitting: Radiation Oncology

## 2013-02-22 DIAGNOSIS — C3410 Malignant neoplasm of upper lobe, unspecified bronchus or lung: Secondary | ICD-10-CM | POA: Insufficient documentation

## 2013-02-22 DIAGNOSIS — E785 Hyperlipidemia, unspecified: Secondary | ICD-10-CM | POA: Insufficient documentation

## 2013-02-22 DIAGNOSIS — Z510 Encounter for antineoplastic radiation therapy: Secondary | ICD-10-CM | POA: Insufficient documentation

## 2013-02-22 DIAGNOSIS — I10 Essential (primary) hypertension: Secondary | ICD-10-CM | POA: Diagnosis not present

## 2013-02-22 DIAGNOSIS — Z96649 Presence of unspecified artificial hip joint: Secondary | ICD-10-CM | POA: Diagnosis not present

## 2013-02-22 HISTORY — DX: Personal history of other diseases of the respiratory system: Z87.09

## 2013-02-22 HISTORY — DX: Anxiety disorder, unspecified: F41.9

## 2013-02-22 HISTORY — DX: Malignant neoplasm of unspecified part of unspecified bronchus or lung: C34.90

## 2013-02-22 NOTE — Progress Notes (Signed)
  Radiation Oncology         [phone] ________________________________  Name: [name] [name] MRN: [id number]  Date: 02/22/2013  DOB: 04/30/1941  STEREOTACTIC BODY RADIOTHERAPY SIMULATION AND TREATMENT PLANNING NOTE  DIAGNOSIS:  72 year old woman with an 18 mm squamous cell carcinoma of the left upper lobe lung-clinical stage IA  NARRATIVE:  The patient was brought to the CT Simulation planning suite.  Identity was confirmed.  All relevant records and images related to the planned course of therapy were reviewed.  The patient freely provided informed written consent to proceed with treatment after reviewing the details related to the planned course of therapy. The consent form was witnessed and verified by the simulation staff.  Then, the patient was set-up in a stable reproducible  supine position for radiation therapy.  A BodyFix immobilization pillow was fabricated for reproducible positioning.  Then I personally applied the abdominal compression paddle to limit respiratory excursion.  4D respiratoy motion management CT images were obtained.  Surface markings were placed.  The CT images were loaded into the planning software.  Then, using Cine, MIP, and standard views, the internal target volume (ITV) and planning target volumes (PTV) were delinieated, and avoidance structures were contoured.  Treatment planning then occurred.  The radiation prescription was entered and confirmed.  A total of two complex treatment devices were fabricated in the form of the BodyFix immobilization pillow and a neck accuform cushion.  I have requested : 3D Simulation  I have requested a DVH of the following structures: Heart, Lungs, Esophagus, Chest Wall, Brachial Plexus, Major Blood Vessels, and targets.  PLAN:  The patient will receive 54 Gy in 3 fractions.  ________________________________  Artist Pais Kathrynn Running, M.D.

## 2013-02-22 NOTE — Progress Notes (Signed)
Please see the Nurse Progress Note in the MD Initial Consult Encounter for this patient. 

## 2013-02-22 NOTE — Addendum Note (Signed)
Encounter addended by: Lowella Petties, RN on: 02/22/2013 10:10 AM<BR>     Documentation filed: Charges VN

## 2013-02-22 NOTE — Telephone Encounter (Signed)
Met w patient to discuss RO billing. Pt had no financial concerns today. But did however, advised she will have a new insurance card by March 14, 2013.  Dx: Lung cancer, left - Primary 162.9  Attending Rad: MM  Rad Tx: SRS x3

## 2013-02-27 NOTE — Addendum Note (Signed)
Encounter addended by: Lowella Petties, RN on: 02/27/2013  1:09 PM<BR>     Documentation filed: Charges VN

## 2013-02-28 ENCOUNTER — Telehealth: Payer: Self-pay | Admitting: *Deleted

## 2013-02-28 DIAGNOSIS — C3410 Malignant neoplasm of upper lobe, unspecified bronchus or lung: Secondary | ICD-10-CM | POA: Diagnosis not present

## 2013-02-28 DIAGNOSIS — Z510 Encounter for antineoplastic radiation therapy: Secondary | ICD-10-CM | POA: Diagnosis not present

## 2013-02-28 NOTE — Telephone Encounter (Signed)
I called patient to review her scheduled Radiation Therapy appointments.  Patient reports that she had a very thorough and informative visit with Dr. Kathrynn Running and the staff in Radiation Oncology and that she does not have any questions or concerns at this time.  She was encouraged to call with any questions or concerns.

## 2013-03-01 ENCOUNTER — Ambulatory Visit
Admission: RE | Admit: 2013-03-01 | Discharge: 2013-03-01 | Disposition: A | Discharge status: Home / Self Care | Payer: Medicare Other | Source: Ambulatory Visit | Attending: Radiation Oncology | Admitting: Radiation Oncology

## 2013-03-01 DIAGNOSIS — Z510 Encounter for antineoplastic radiation therapy: Secondary | ICD-10-CM | POA: Diagnosis not present

## 2013-03-01 DIAGNOSIS — C3410 Malignant neoplasm of upper lobe, unspecified bronchus or lung: Secondary | ICD-10-CM | POA: Diagnosis not present

## 2013-03-01 NOTE — Progress Notes (Signed)
  Radiation Oncology         [phone] ________________________________  Name: [name] [name] MRN: [id number]  Date: 03/01/2013  DOB: Jun 13, 1941  Stereotactic Body Radiotherapy Treatment Procedure Note  NARRATIVE:  [name] [name] was brought to the stereotactic radiation treatment machine and placed supine on the CT couch. The patient was set up for stereotactic body radiotherapy on the body fix pillow.  3D TREATMENT PLANNING AND DOSIMETRY:  The patient's radiation plan was reviewed and approved prior to starting treatment.  It showed 3-dimensional radiation distributions overlaid onto the planning CT.  The [facility] for the target structures as well as the organs at risk were reviewed. The documentation of this is filed in the radiation oncology EMR.  SIMULATION VERIFICATION:  The patient underwent CT imaging on the treatment unit.  These were carefully aligned to document that the ablative radiation dose would cover the target volume and maximally spare the nearby organs at risk according to the planned distribution.  SPECIAL TREATMENT PROCEDURE: [name] [name] received high dose ablative stereotactic body radiotherapy to the planned target volume without unforeseen complications. Treatment was delivered uneventfully. The high doses associated with stereotactic body radiotherapy and the significant potential risks require careful treatment set up and patient monitoring constituting a special treatment procedure   STEREOTACTIC TREATMENT MANAGEMENT:  Following delivery, the patient was evaluated clinically. The patient tolerated treatment without significant acute effects, and was discharged to home in stable condition.    PLAN: Continue treatment as planned.  ________________________________  Artist Pais. Kathrynn Running, M.D.

## 2013-03-03 ENCOUNTER — Ambulatory Visit
Admission: RE | Admit: 2013-03-03 | Discharge: 2013-03-03 | Disposition: A | Discharge status: Home / Self Care | Payer: Medicare Other | Source: Ambulatory Visit | Attending: Radiation Oncology | Admitting: Radiation Oncology

## 2013-03-03 DIAGNOSIS — C3410 Malignant neoplasm of upper lobe, unspecified bronchus or lung: Secondary | ICD-10-CM | POA: Diagnosis not present

## 2013-03-03 DIAGNOSIS — Z510 Encounter for antineoplastic radiation therapy: Secondary | ICD-10-CM | POA: Diagnosis not present

## 2013-03-03 NOTE — Progress Notes (Signed)
  Radiation Oncology         [phone] ________________________________  Name: [name] [name] MRN: [id number]  Date: 03/03/2013  DOB: [birth year]  Stereotactic Body Radiotherapy Treatment Procedure Note  NARRATIVE:  [name] [name] was brought to the stereotactic radiation treatment machine and placed supine on the CT couch. The patient was set up for stereotactic body radiotherapy on the body fix pillow.  3D TREATMENT PLANNING AND DOSIMETRY:  The patient's radiation plan was reviewed and approved prior to starting treatment.  It showed 3-dimensional radiation distributions overlaid onto the planning CT.  The [facility] for the target structures as well as the organs at risk were reviewed. The documentation of this is filed in the radiation oncology EMR.  SIMULATION VERIFICATION:  The patient underwent CT imaging on the treatment unit.  These were carefully aligned to document that the ablative radiation dose would cover the target volume and maximally spare the nearby organs at risk according to the planned distribution.  SPECIAL TREATMENT PROCEDURE: [name] [name] received high dose ablative stereotactic body radiotherapy to the planned target volume without unforeseen complications. Treatment was delivered uneventfully. The high doses associated with stereotactic body radiotherapy and the significant potential risks require careful treatment set up and patient monitoring constituting a special treatment procedure   STEREOTACTIC TREATMENT MANAGEMENT:  Following delivery, the patient was evaluated clinically. The patient tolerated treatment without significant acute effects, and was discharged to home in stable condition.    PLAN: Continue treatment as planned.  ________________________________  Artist Pais. Kathrynn Running, M.D.

## 2013-03-03 NOTE — Progress Notes (Signed)
  Radiation Oncology         [phone] ________________________________  Name: [name] [name] MRN: [id number]  Date: 03/03/2013  DOB: Nov 20, 1941  Weekly Radiation Therapy Management  Current Dose: 36 Gy     Planned Dose:  54 Gy  Narrative . . . . . . . . The patient presents for routine under treatment assessment.                                                    The patient is without complaint.                                 Set-up films were reviewed.                                 The chart was checked. Physical Findings. . . . Weight essentially stable.  No significant changes. Impression . . . . . . . The patient is  tolerating radiation. Plan . . . . . . . . . . . . Continue treatment as planned.  ________________________________  Artist Pais. Kathrynn Running, M.D.

## 2013-03-03 NOTE — Progress Notes (Signed)
Patient here 2/3 radiation tx Lt Lung, alert,oriented x3,  No coughing, nausea, no c/o pain, room air sats =(100% room air)   No c/o side effects states patient, drinking and eating well, 1 month f/u appt card given to pt  3:04 PM

## 2013-03-07 ENCOUNTER — Encounter: Payer: Self-pay | Admitting: Radiation Oncology

## 2013-03-07 NOTE — Progress Notes (Signed)
  Radiation Oncology         [phone]) [phone] ________________________________  Name: [name] [name] MRN: [id number]  Date: 03/08/2013  DOB: 06-28-1941  Stereotactic Body Radiotherapy Treatment Procedure Note  NARRATIVE:  [name] [name] was brought to the stereotactic radiation treatment machine and placed supine on the CT couch. The patient was set up for her final stereotactic body radiotherapy on the body fix pillow.  3D TREATMENT PLANNING AND DOSIMETRY:  The patient's radiation plan was reviewed and approved prior to starting treatment.  It showed 3-dimensional radiation distributions overlaid onto the planning CT.  [facility] for the target structures as well as the organs at risk were reviewed. The documentation of this is filed in the radiation oncology EMR.  SIMULATION VERIFICATION:  The patient underwent CT imaging on the treatment unit.  These were carefully aligned to document that the ablative radiation dose would cover the target volume and maximally spare the nearby organs at risk according to the planned distribution.  SPECIAL TREATMENT PROCEDURE: [name] [name] received high dose ablative stereotactic body radiotherapy to the planned target volume without unforeseen complications. Treatment was delivered uneventfully. The high doses associated with stereotactic body radiotherapy and the significant potential risks require careful treatment set up and patient monitoring constituting a special treatment procedure   STEREOTACTIC TREATMENT MANAGEMENT:  Following delivery, the patient was evaluated clinically. The patient tolerated treatment without significant acute effects, and was discharged to home in stable condition.    PLAN: Complete treatment today as planned.  Follow-up in one month and then repeat chest CT without contrast in 6 weeks.  ________________________________  Artist Pais Kathrynn Running, M.D.

## 2013-03-08 ENCOUNTER — Ambulatory Visit
Admission: RE | Admit: 2013-03-08 | Discharge: 2013-03-08 | Disposition: A | Discharge status: Home / Self Care | Payer: Medicare Other | Source: Ambulatory Visit | Attending: Radiation Oncology | Admitting: Radiation Oncology

## 2013-03-08 ENCOUNTER — Encounter: Payer: Self-pay | Admitting: Radiation Oncology

## 2013-03-08 DIAGNOSIS — C3410 Malignant neoplasm of upper lobe, unspecified bronchus or lung: Secondary | ICD-10-CM | POA: Diagnosis not present

## 2013-03-08 DIAGNOSIS — Z510 Encounter for antineoplastic radiation therapy: Secondary | ICD-10-CM | POA: Diagnosis not present

## 2013-03-09 DIAGNOSIS — N951 Menopausal and female climacteric states: Secondary | ICD-10-CM | POA: Diagnosis not present

## 2013-03-09 DIAGNOSIS — I10 Essential (primary) hypertension: Secondary | ICD-10-CM | POA: Diagnosis not present

## 2013-03-09 DIAGNOSIS — Z79899 Other long term (current) drug therapy: Secondary | ICD-10-CM | POA: Diagnosis not present

## 2013-03-09 DIAGNOSIS — E7800 Pure hypercholesterolemia, unspecified: Secondary | ICD-10-CM | POA: Diagnosis not present

## 2013-03-10 ENCOUNTER — Encounter: Payer: Self-pay | Admitting: *Deleted

## 2013-03-10 NOTE — Progress Notes (Signed)
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 6 megavolt photons were delivered in the flattening filter free beam mode. The patient was set up and immobilized using a body fix immobilization pillow and cone beam CT was performed prior to each treatment for alignment of the target.  Narrative: The patient tolerated radiation treatment relatively well.   She did not experience any acute complications.  Plan: The patient has completed radiation treatment. The patient will return to radiation oncology clinic for routine followup in one month. I advised them to call or return sooner if they have any questions or concerns related to their recovery or treatment. ________________________________  Artist Pais. Kathrynn Running, M.D.

## 2013-03-14 DIAGNOSIS — N951 Menopausal and female climacteric states: Secondary | ICD-10-CM | POA: Diagnosis not present

## 2013-03-22 ENCOUNTER — Ambulatory Visit: Payer: Medicare Other

## 2013-03-24 ENCOUNTER — Telehealth: Payer: Self-pay | Admitting: *Deleted

## 2013-03-24 NOTE — Telephone Encounter (Signed)
I called patient to check in and review her scheduled appointments.  She states that she is doing well.  She has started going to the gym and is beginning to regain her strength and energy.  She denies any questions or concerns at this time. She verified that she has my contact information and was encouraged to call for any questions, concerns or needs

## 2013-03-26 ENCOUNTER — Encounter: Payer: Self-pay | Admitting: Radiation Oncology

## 2013-04-04 ENCOUNTER — Telehealth: Payer: Self-pay | Admitting: *Deleted

## 2013-04-04 NOTE — Telephone Encounter (Signed)
Called patient to alter fu visit for 04-06-13 due to Dr. Kathrynn Running being in the OR, rescheduled for 04-13-13 at 9:30 a.m., spoke with patient and she is aware of the change and good with it.

## 2013-04-06 ENCOUNTER — Ambulatory Visit: Payer: Medicare Other | Admitting: Radiation Oncology

## 2013-04-11 ENCOUNTER — Telehealth: Payer: Self-pay | Admitting: Radiation Oncology

## 2013-04-11 NOTE — Telephone Encounter (Signed)
Patient had left a vm for [facility] concerning billing.  Called patient back and left a vm for her to call me and I will be glad to help her if I can.

## 2013-04-13 ENCOUNTER — Ambulatory Visit
Admission: RE | Admit: 2013-04-13 | Discharge: 2013-04-13 | Disposition: A | Discharge status: Home / Self Care | Payer: Medicare Other | Source: Ambulatory Visit | Attending: Radiation Oncology | Admitting: Radiation Oncology

## 2013-04-13 ENCOUNTER — Encounter: Payer: Self-pay | Admitting: Radiation Oncology

## 2013-04-13 ENCOUNTER — Telehealth: Payer: Self-pay | Admitting: *Deleted

## 2013-04-13 NOTE — Addendum Note (Signed)
Encounter addended by: Oneita Hurt, MD on: 04/13/2013 10:13 AM<BR>     Documentation filed: Visit Diagnoses, Notes Section, Follow-up Section, LOS Section, Problem List

## 2013-04-13 NOTE — Progress Notes (Signed)
Opened in error. Ship broker.

## 2013-04-13 NOTE — Progress Notes (Signed)
Patient presents to the clinic today unaccompanied for follow up with Dr. Kathrynn Running one month s/p sbrt to left lung. Patient alert and oriented to person, place, and time. No distress noted. Steady gait noted. Pleasant affect noted. Patient denies pain at this time. Patient concerned about tenderness and swelling in the left supraclavicular area. Patient reports this has been present x1 week. Patient denies shortness breath. Patient denies cough. Patient denies painful or difficult swallowing. Patient denies headache, nausea, vomiting, diarrhea. Patient denies unintentional weight loss or night sweats. Patient's blood pressure elevated today but, she confirms she took her bp medication and reports this is related to anxiety about this visit. Reported all findings to Dr. Kathrynn Running.

## 2013-04-13 NOTE — Telephone Encounter (Signed)
Called patient to inform of test and fu visit, spoke with patient and she is aware of this test and the fu visit

## 2013-04-13 NOTE — Progress Notes (Signed)
  Radiation Oncology         [phone]) [phone] ________________________________  Name: [name] [name] MRN: [id number]  Date: 04/13/2013  DOB: [birth year]  Follow-Up Visit Note  CC: Benita Stabile, MD  Oretha Milch, MD  Diagnosis:   72 year old woman with an 18 mm squamous cell carcinoma of the left upper lobe lung-clinical stage IA   Interval Since Last Radiation:  1  months  Narrative:  The patient returns today for routine follow-up.   she is essentially without complaint. She did notice some fullness in the left supraclavicular region that was concerning to her. She denies any chest wall pain or shoulder pain. She denies any dyspnea or cough.                               ALLERGIES:  is allergic to penicillins.  Meds: Current Outpatient Prescriptions  Medication Sig Dispense Refill  . acetaminophen (TYLENOL) 500 MG tablet Take 1,000 mg by mouth every 6 (six) hours as needed for pain.      Marland Kitchen amLODipine-benazepril (LOTREL) 5-20 MG per capsule Take 1 capsule by mouth daily before breakfast.       . atorvastatin (LIPITOR) 20 MG tablet Take 20 mg by mouth daily before breakfast.       . Coenzyme Q10 (CO Q 10) 100 MG CAPS Take 100 mg by mouth at bedtime.       . hydrochlorothiazide (HYDRODIURIL) 25 MG tablet Take 25 mg by mouth daily before breakfast.       . ibuprofen (ADVIL,MOTRIN) 200 MG tablet Take 400 mg by mouth every 6 (six) hours as needed for pain.       No current facility-administered medications for this encounter.    Physical Findings: Patient alert and oriented to person, place, and time. No distress noted. Steady gait noted. Pleasant affect noted.  weight is 126 lb 1.6 oz (57.199 kg). Her oral temperature is 97.7 F (36.5 C). Her blood pressure is 157/104 and her pulse is 97. Her respiration is 18 and oxygen saturation is 97%. .She does have indistinct fullness in the left supraclavicular region, which is consistent with a  Prominent first rib. There are no enlarged  lymph nodes in the supraclavicular region on the left or right. The anterior chest wall was inspected and there is no radiation dermatitis or desquamation overlying the treated area in the left upper lung respiratory effort is unremarkable.   No significant changes.  Radiographic Findings:  I reviewed the patient's previous chest imaging and confirmed that her left first rib take off is higher than her right side producing more prominent fullness in the left supraclavicular region on physical exam.   Impression:  The patient is recovering from the effects of radiation.   She has no overt complications related to her treatment.   Plan:   I have ordered chest CT in 2 months and then followup visit my office a few days later to review the results.   _____________________________________  Artist Pais Kathrynn Running, M.D.

## 2013-05-18 ENCOUNTER — Ambulatory Visit (HOSPITAL_COMMUNITY): Payer: Medicare Other

## 2013-06-13 ENCOUNTER — Telehealth: Payer: Self-pay | Admitting: Radiation Oncology

## 2013-06-13 ENCOUNTER — Ambulatory Visit (HOSPITAL_COMMUNITY)
Admission: RE | Admit: 2013-06-13 | Discharge: 2013-06-13 | Disposition: A | Discharge status: Home / Self Care | Payer: Medicare Other | Source: Ambulatory Visit | Attending: Radiation Oncology | Admitting: Radiation Oncology

## 2013-06-13 DIAGNOSIS — J84112 Idiopathic pulmonary fibrosis: Secondary | ICD-10-CM | POA: Diagnosis not present

## 2013-06-13 DIAGNOSIS — Z923 Personal history of irradiation: Secondary | ICD-10-CM | POA: Diagnosis not present

## 2013-06-13 DIAGNOSIS — C3490 Malignant neoplasm of unspecified part of unspecified bronchus or lung: Secondary | ICD-10-CM | POA: Insufficient documentation

## 2013-06-13 DIAGNOSIS — I2510 Atherosclerotic heart disease of native coronary artery without angina pectoris: Secondary | ICD-10-CM | POA: Insufficient documentation

## 2013-06-13 DIAGNOSIS — M5124 Other intervertebral disc displacement, thoracic region: Secondary | ICD-10-CM | POA: Insufficient documentation

## 2013-06-13 DIAGNOSIS — I700 Atherosclerosis of aorta: Secondary | ICD-10-CM | POA: Insufficient documentation

## 2013-06-13 NOTE — Telephone Encounter (Signed)
Phoned patient as directed by Dr. Kathrynn Running. Explained her CT looked great. Also, explained that the treated tumor is shrinking. Reminded patient of follow up appointment on Thursday. Patient verbalized understanding and great appreciation for the call.

## 2013-06-13 NOTE — Telephone Encounter (Signed)
Message copied by Agnes Lawrence on Tue Jun 13, 2013  4:01 PM ------      Message from: [place], [place]      Created: Tue Jun 13, 2013  2:19 PM       Sam,            Please call Myiah and let her know her CT looks very good.  Her treated lung tumor is shrinking, and there are no new areas of concern.  I look forward to showing her the CT Thursday.            MM ------

## 2013-06-15 ENCOUNTER — Ambulatory Visit
Admission: RE | Admit: 2013-06-15 | Discharge: 2013-06-15 | Disposition: A | Discharge status: Home / Self Care | Payer: Medicare Other | Source: Ambulatory Visit | Attending: Radiation Oncology | Admitting: Radiation Oncology

## 2013-06-15 ENCOUNTER — Encounter: Payer: Self-pay | Admitting: Radiation Oncology

## 2013-06-15 ENCOUNTER — Encounter: Payer: Self-pay | Admitting: *Deleted

## 2013-06-15 NOTE — Progress Notes (Signed)
Patient at [facility] for f/u visit with Dr. Kathrynn Running.  Patient accompanied by her son and daughter.  She reports that she is doing well and anxious to discuss her latest CT scan with Dr. Kathrynn Running.  She denies any questions or concerns at this time.  She was encouraged to call me if she has any questions after talking with Dr. Kathrynn Running or if she has any other needs or concerns. Patient verbalized understanding.

## 2013-06-15 NOTE — Progress Notes (Signed)
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   She recently underwent followup chest CT. She reports no change in breathing. She denies cough.                              ALLERGIES:  is allergic to penicillins.  Meds: Current Outpatient Prescriptions  Medication Sig Dispense Refill  . acetaminophen (TYLENOL) 500 MG tablet Take 1,000 mg by mouth every 6 (six) hours as needed for pain.      Marland Kitchen amLODipine-benazepril (LOTREL) 5-20 MG per capsule Take 1 capsule by mouth daily before breakfast.       . atorvastatin (LIPITOR) 20 MG tablet Take 20 mg by mouth daily before breakfast.       . hydrochlorothiazide (HYDRODIURIL) 25 MG tablet Take 25 mg by mouth daily before breakfast.       . ibuprofen (ADVIL,MOTRIN) 200 MG tablet Take 400 mg by mouth every 6 (six) hours as needed for pain.      . Coenzyme Q10 (CO Q 10) 100 MG CAPS Take 100 mg by mouth at bedtime.        No current facility-administered medications for this encounter.    Physical Findings: The patient is in no acute distress. Patient is alert and oriented.  weight is 126 lb 14.4 oz (57.561 kg). Her oral temperature is 97 F (36.1 C). Her blood pressure is 181/87 and her pulse is 62. Her respiration is 18 and oxygen saturation is 98%. .  No significant changes.  Lab Findings: Lab Results  Component Value Date   WBC 6.2 02/16/2013   HGB 13.3 02/16/2013   HCT 39.4 02/16/2013   MCV 94.5 02/16/2013   PLT 263 02/16/2013     @LASTCHEM @  Radiographic Findings: Ct Chest Wo Contrast  06/13/2013   *RADIOLOGY REPORT*  Clinical Data: Lung cancer. Status post SBRT  CT CHEST WITHOUT CONTRAST  Technique:  Multidetector CT imaging of the chest was performed following the standard protocol without IV contrast.  Comparison: PET CT 01/30/2013.  Findings: The chest wall is unremarkable and stable.  No breast masses, supraclavicular or axillary lymphadenopathy.  Small scattered lymph nodes are noted.  The thyroid gland is grossly normal.  The bony thorax is intact.  No destructive bone lesions or spinal canal compromise.  Moderate degenerative changes noted in the lower thoracic spine.  A small calcified disc protrusion noted in the mid thoracic spine.  The heart is normal in size.  Stable mediastinal and hilar lymph nodes.  Stable advanced atherosclerotic changes involving the aorta.  Stable coronary artery calcifications.  The esophagus is grossly normal.  Examination of the lung parenchyma demonstrates stable changes of interstitial pulmonary fibrosis with peripheral honeycombing.  The left upper lobe pulmonary nodule has decreased in size.  It previously measured 18 x 14 mm and now measures 11 x 10 mm. Adjacent area of airspace opacification is likely radiation change. No new worrisome  pulmonary nodules or masses.  No pleural effusion. Stable 5 mm left lower lobe lung nodule on image number 38.  The upper abdomen is grossly normal.  IMPRESSION:  1.  Interval decrease in size of the left upper lobe pulmonary nodule. 2.  Adjacent airspace opacity in the left upper lobe is likely radiation change. 3.  Stable changes of interstitial pulmonary fibrosis. 4.  Stable mediastinal and hilar lymph nodes.   Original Report Authenticated By: Rudie Meyer, M.D.    Impression:  The patient  has a favorable response to radiation with reduction in the size of her treated lung nodule. She does have some surrounding radiation changes.  Plan:   Chest  CT in 6 months then followup.  _____________________________________  Artist Pais. Kathrynn Running, M.D.

## 2013-06-15 NOTE — Progress Notes (Signed)
Denies pain at this time. Denies nausea, vomiting, headache, dizziness or diarrhea. Denies night sweats or unintentional weight loss. Bp elevated. Patient reports that she took her bp medication this morning. Denies cough, shortness of breath, or painful swallowing. Denies complaints at this time. Reports eating and sleeping without difficulty.

## 2013-06-19 ENCOUNTER — Telehealth: Payer: Self-pay | Admitting: *Deleted

## 2013-06-19 NOTE — Telephone Encounter (Signed)
CALLED PATIENT TO INFORM OF TEST AND FU FOR  JAN. 2015, LVM FOR A RETURN CALL

## 2013-06-29 DIAGNOSIS — Z1231 Encounter for screening mammogram for malignant neoplasm of breast: Secondary | ICD-10-CM | POA: Diagnosis not present

## 2013-06-29 DIAGNOSIS — Z803 Family history of malignant neoplasm of breast: Secondary | ICD-10-CM | POA: Diagnosis not present

## 2013-07-21 ENCOUNTER — Telehealth: Payer: Self-pay | Admitting: *Deleted

## 2013-07-21 ENCOUNTER — Telehealth: Payer: Self-pay | Admitting: Radiation Oncology

## 2013-07-21 DIAGNOSIS — R071 Chest pain on breathing: Secondary | ICD-10-CM | POA: Diagnosis not present

## 2013-07-21 DIAGNOSIS — T66XXXA Radiation sickness, unspecified, initial encounter: Secondary | ICD-10-CM | POA: Diagnosis not present

## 2013-07-21 NOTE — Telephone Encounter (Signed)
Phoned [name] [name]. Requested she schedule a follow up for this patient with Dr. Kathrynn Running per his order. She verbalized she would arrange the appointment and contact the patient with date and time.

## 2013-07-21 NOTE — Telephone Encounter (Signed)
Patient called because she is experiencing redness and swelling on her left chest.  Patient completed SBRT 03/08/13 for LUL squamous cell carcinoma.  She went to her PCP's office and was told that her symptoms were a late reaction to her SBRT treatments.  Patient called because she wanted to verify that she could have these symptoms 5 months post treatment.  I told her that it is possible to have delayed symptoms and that I would review with Dr. Kathrynn Running.  Dr. Kathrynn Running confirmed that her symptoms could be a delayed reaction and that he would see her to evaluate her symptoms.  I called patient back and she agreed to f/u with Dr. Kathrynn Running.  I notified Reinaldo Berber, RN who will arrange patient's appointment. I advised patient to call me for any questions, concerns or needs she may have.

## 2013-07-27 ENCOUNTER — Encounter: Payer: Self-pay | Admitting: Radiation Oncology

## 2013-07-27 ENCOUNTER — Ambulatory Visit
Admission: RE | Admit: 2013-07-27 | Discharge: 2013-07-27 | Disposition: A | Discharge status: Home / Self Care | Payer: Medicare Other | Source: Ambulatory Visit | Attending: Radiation Oncology | Admitting: Radiation Oncology

## 2013-07-27 NOTE — Progress Notes (Signed)
Reports upper extremity weakness. Reports soreness of left upper arm has resolved since she hasn't worked out in one week. Skin of left upper chest has returned to normal color and appearance. Credit card size area noted left upper chest. Patient reports edema of this area is much less since taking etodolac but, feels hard to palpitation. Denies cough, dyspnea, shortness of breath, painful swallowing or hoarseness.

## 2013-07-27 NOTE — Progress Notes (Signed)
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  She recently presented to her primary care physician's office complaining of some erythema and discomfort overlying the left upper in her chest wall. She also reported some discomfort radiating to the left arm up on exam, her primary care physician determined that this was likely related radiation treatment given the proximity of the reaction to the underlying lung cancer and radiotherapy. Since that visit, her symptoms have improved.                              ALLERGIES:  is allergic to penicillins.  Meds: Current Outpatient Prescriptions  Medication Sig Dispense Refill  . acetaminophen (TYLENOL) 500 MG tablet Take 1,000 mg by mouth every 6 (six) hours as needed for pain.      Marland Kitchen amLODipine-benazepril (LOTREL) 5-20 MG per capsule Take 1 capsule by mouth daily before breakfast.       . atorvastatin (LIPITOR) 20 MG tablet Take 20 mg by mouth daily before breakfast.       . Coenzyme Q10 (CO Q 10) 100 MG CAPS Take 100 mg by mouth at bedtime.       Marland Kitchen etodolac (LODINE) 400 MG tablet Take 400 mg by mouth 2 (two) times daily.      . hydrochlorothiazide (HYDRODIURIL) 25 MG tablet Take 25 mg by mouth daily before breakfast.       . ibuprofen (ADVIL,MOTRIN) 200 MG tablet Take 400 mg by mouth every 6 (six) hours as needed for pain.       No current facility-administered medications for this encounter.    Physical Findings: The patient is in no acute distress. Patient is alert and oriented.  weight is 129 lb 3.2 oz (58.605 kg). Her oral temperature is  97.9 F (36.6 C). Her blood pressure is 172/92 and her pulse is 62. Her respiration is 16 and oxygen saturation is 96%. .  The patient has minimal persistent erythema over the left parasternal skin inferior to the clavicle. The geometry correlates with the location of her radiation treatment 4 months ago. There is no desquamation noted. There is some mild tenderness to palpation. No significant changes.  Impression:  The patient is recovering from the effects of radiation.  She seems to have had some delayed reaction or recall reaction to radiotherapy with erythema over her treatment site. This is unusual, since she did not experience a skin reaction during the few weeks after radiation as one might expect with an acute dermatitis. This suggests a recall reaction which may have been precipitated by solar exposure, or some drug interaction. It is also possible that this represents a delayed or subacute reaction. Regardless, it has improved and is not appear to be an ongoing issue. I suspect this recent episode may predict for an increased risk for some fibrosis in the pectoral muscle and intercostal muscles in the future.  Plan:  The patient will continue for routine followup at her previous schedule time for six-month followup chest CT. I've encouraged her in  the meantime to return to our office if she develops any further reactions within the radiation treatment area for further evaluation.  _____________________________________  Artist Pais Kathrynn Running, M.D.

## 2013-09-08 DIAGNOSIS — N951 Menopausal and female climacteric states: Secondary | ICD-10-CM | POA: Diagnosis not present

## 2013-09-08 DIAGNOSIS — E7800 Pure hypercholesterolemia, unspecified: Secondary | ICD-10-CM | POA: Diagnosis not present

## 2013-09-08 DIAGNOSIS — I10 Essential (primary) hypertension: Secondary | ICD-10-CM | POA: Diagnosis not present

## 2013-09-08 DIAGNOSIS — Z79899 Other long term (current) drug therapy: Secondary | ICD-10-CM | POA: Diagnosis not present

## 2013-09-12 DIAGNOSIS — H2589 Other age-related cataract: Secondary | ICD-10-CM | POA: Diagnosis not present

## 2013-10-05 ENCOUNTER — Other Ambulatory Visit: Payer: Self-pay

## 2013-12-13 ENCOUNTER — Ambulatory Visit (HOSPITAL_COMMUNITY)
Admission: RE | Admit: 2013-12-13 | Discharge: 2013-12-13 | Disposition: A | Discharge status: Home / Self Care | Payer: Medicare Other | Source: Ambulatory Visit | Attending: Radiation Oncology | Admitting: Radiation Oncology

## 2013-12-13 DIAGNOSIS — I700 Atherosclerosis of aorta: Secondary | ICD-10-CM | POA: Diagnosis not present

## 2013-12-13 DIAGNOSIS — M47814 Spondylosis without myelopathy or radiculopathy, thoracic region: Secondary | ICD-10-CM | POA: Diagnosis not present

## 2013-12-13 DIAGNOSIS — C3410 Malignant neoplasm of upper lobe, unspecified bronchus or lung: Secondary | ICD-10-CM | POA: Diagnosis not present

## 2013-12-13 DIAGNOSIS — J8410 Pulmonary fibrosis, unspecified: Secondary | ICD-10-CM | POA: Insufficient documentation

## 2013-12-13 DIAGNOSIS — J984 Other disorders of lung: Secondary | ICD-10-CM | POA: Diagnosis not present

## 2013-12-13 DIAGNOSIS — J438 Other emphysema: Secondary | ICD-10-CM | POA: Insufficient documentation

## 2013-12-13 DIAGNOSIS — Z923 Personal history of irradiation: Secondary | ICD-10-CM | POA: Insufficient documentation

## 2013-12-13 DIAGNOSIS — R918 Other nonspecific abnormal finding of lung field: Secondary | ICD-10-CM | POA: Diagnosis not present

## 2013-12-14 ENCOUNTER — Ambulatory Visit: Payer: Medicare Other | Admitting: Radiation Oncology

## 2013-12-15 ENCOUNTER — Telehealth: Payer: Self-pay | Admitting: Radiation Oncology

## 2013-12-15 NOTE — Telephone Encounter (Signed)
Per Dr. Johny Shears order phoned patient. Informed patient, Dr. Tammi Klippel has reviewed your scans, the treated tumor appears to be continuing to respond favorably and he looks forward to reviewing the scan with you next week. She verbalized understanding and expressed appreciation for the call.

## 2013-12-20 ENCOUNTER — Encounter: Payer: Self-pay | Admitting: Radiation Oncology

## 2013-12-20 NOTE — Progress Notes (Signed)
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  Denies cough, SOB, dyspnea, or difficulty swallowing. Voice remain raspy. Denies nausea, vomiting, headache, dizziness, diarrhea, night sweats or weight loss. Denies fatigue. Denies pain. Here to review recent CT scan of chest                               ALLERGIES:  is allergic to penicillins.  Meds: Current Outpatient Prescriptions  Medication Sig Dispense Refill  . acetaminophen (TYLENOL) 500 MG tablet Take 1,000 mg by mouth every 6 (six) hours as needed for pain.      Marland Kitchen amLODipine-benazepril (LOTREL) 5-20 MG per capsule Take 1 capsule by mouth daily before breakfast.       . atorvastatin (LIPITOR) 20 MG tablet Take 20 mg by mouth daily before breakfast.       . Coenzyme Q10 (CO Q 10) 100 MG CAPS Take 100 mg by mouth at bedtime.       Marland Kitchen etodolac (LODINE) 400 MG tablet Take 400 mg by mouth 2 (two) times daily.      . hydrochlorothiazide (HYDRODIURIL) 25 MG tablet Take 25 mg by mouth daily before breakfast.       . ibuprofen (ADVIL,MOTRIN) 200 MG tablet Take 400 mg by mouth every 6 (six) hours as needed for pain.       No current facility-administered medications for this encounter.    Physical Findings: The patient is in no acute distress. Patient is alert and oriented.  vitals were not taken for this visit..  No significant changes.  Lab Findings: Lab Results  Component Value Date   WBC 6.2 02/16/2013   HGB 13.3 02/16/2013   HCT 39.4 02/16/2013   MCV 94.5 02/16/2013   PLT 263 02/16/2013    @LASTCHEM @  Radiographic Findings: Ct Chest Wo  Contrast  12/13/2013   CLINICAL DATA:  Lung cancer.  EXAM: CT CHEST WITHOUT CONTRAST  TECHNIQUE: Multidetector CT imaging of the chest was performed following the standard protocol without IV contrast.  COMPARISON:  06/13/2013.  FINDINGS: The chest wall is unremarkable and stable. Mild interstitial changes and blurring of the fat between the pectoralis major and minor muscles on the left side may be due to prior port placement or radiation therapy. No breast masses, supraclavicular or axillary lymphadenopathy.  The bony thorax is intact. No destructive bone lesions or significant spinal canal compromise. Moderate degenerative changes throughout the thoracic spine. The sternum is intact. No obvious rib lesions.  The heart is normal in size. No pericardial effusion. No mediastinal or hilar mass or adenopathy. Stable advanced atherosclerotic calcifications involving the aorta and coronary arteries. The esophagus is grossly normal.  Examination of the lung parenchyma demonstrates stable radiation changes in the left upper lobe. The left upper lobe lung mass measures 10 x 8 mm. It previously measured 11 x 10 mm. Stable underlying paraseptal emphysema. In the superior segment of the left lower lobe there is a stable ill-defined somewhat lobulated airspace opacity with air bronchograms. This has enlarged since the prior  study and could not exclude a slow-growing adenocarcinoma. Biopsy is recommended. Stable subpleural interstitial lung disease. Stable small right upper lobe pulmonary nodules. No new pulmonary lesions. The tracheobronchial tree is stable. No bronchiectasis. No pleural effusion.  The upper abdomen is unremarkable.  IMPRESSION: 1. Stable to slightly smaller left upper lobe pulmonary lesion with surrounding radiation changes. 2. Enlarging nodular lobulated airspace opacity in the left lower lobe worrisome for slow growing adenocarcinoma. Recommend biopsy. 3. Stable chronic lung changes with paraseptal  emphysema and subpleural interstitial disease. 4. Stable small right upper lobe pulmonary nodules and 5 mm left lower lobe pulmonary nodule (image 34). 5. No mediastinal or hilar adenopathy. 6. Stable ill-defined interstitial changes in the left anterior chest wall likely due to radiation.   Electronically Signed   By: Kalman Jewels M.D.   On: 12/13/2013 12:20   Impression:  The patient's treated tumor responded to treatment.  There is an enlarging ground glass density in the right lung warranting further attention.  Plan:  PET scan now to assess right lung mass and re-stage mediastinum and distant organs.  PET will show increased activity at treated site, which is expected.  After PET will discuss possible right lung biopsy.  _____________________________________  Sheral Apley Tammi Klippel, M.D.

## 2013-12-21 ENCOUNTER — Telehealth: Payer: Self-pay | Admitting: *Deleted

## 2013-12-21 ENCOUNTER — Ambulatory Visit
Admission: RE | Admit: 2013-12-21 | Discharge: 2013-12-21 | Disposition: A | Discharge status: Home / Self Care | Payer: Medicare Other | Source: Ambulatory Visit | Attending: Radiation Oncology | Admitting: Radiation Oncology

## 2013-12-21 ENCOUNTER — Encounter: Payer: Self-pay | Admitting: Radiation Oncology

## 2013-12-21 VITALS — Temp 98.0°F | Resp 19 | Wt 132.7 lb

## 2013-12-21 DIAGNOSIS — R911 Solitary pulmonary nodule: Secondary | ICD-10-CM

## 2013-12-21 DIAGNOSIS — C3490 Malignant neoplasm of unspecified part of unspecified bronchus or lung: Secondary | ICD-10-CM

## 2013-12-21 NOTE — Progress Notes (Signed)
Denies cough, SOB, dyspnea, or difficulty swallowing. Voice remain raspy. Denies nausea, vomiting, headache, dizziness, diarrhea, night sweats or weight loss. Denies fatigue. Denies pain. Here to review recent CT scan of chest.

## 2013-12-21 NOTE — Telephone Encounter (Signed)
CALLED PATIENT TO INFORM OF APPT. WITH DR. MANNING ON 12-28-13 AT 4 PM, LVM FOR A RETURN CALL

## 2013-12-26 ENCOUNTER — Ambulatory Visit: Payer: Self-pay | Admitting: Radiation Oncology

## 2013-12-26 ENCOUNTER — Telehealth: Payer: Self-pay | Admitting: *Deleted

## 2013-12-26 DIAGNOSIS — C3490 Malignant neoplasm of unspecified part of unspecified bronchus or lung: Secondary | ICD-10-CM | POA: Diagnosis not present

## 2013-12-26 NOTE — Telephone Encounter (Signed)
I called patient to remind her of her appointment Thursday at [facility].  Patient reports that she had her PET scan done today.  She denied any questions or concerns at this time except having to wait for the results of the PET scan.  I encouraged her to call me for any needs that she may have.

## 2013-12-28 ENCOUNTER — Encounter: Payer: Self-pay | Admitting: Radiation Oncology

## 2013-12-28 ENCOUNTER — Ambulatory Visit: Payer: Medicare Other | Attending: Radiation Oncology | Admitting: Physical Therapy

## 2013-12-28 ENCOUNTER — Ambulatory Visit
Admission: RE | Admit: 2013-12-28 | Discharge: 2013-12-28 | Disposition: A | Discharge status: Home / Self Care | Payer: Medicare Other | Source: Ambulatory Visit | Attending: Radiation Oncology | Admitting: Radiation Oncology

## 2013-12-28 DIAGNOSIS — Z87891 Personal history of nicotine dependence: Secondary | ICD-10-CM | POA: Diagnosis not present

## 2013-12-28 DIAGNOSIS — J8410 Pulmonary fibrosis, unspecified: Secondary | ICD-10-CM | POA: Insufficient documentation

## 2013-12-28 DIAGNOSIS — IMO0001 Reserved for inherently not codable concepts without codable children: Secondary | ICD-10-CM | POA: Insufficient documentation

## 2013-12-28 DIAGNOSIS — M25559 Pain in unspecified hip: Secondary | ICD-10-CM | POA: Insufficient documentation

## 2013-12-28 DIAGNOSIS — C3432 Malignant neoplasm of lower lobe, left bronchus or lung: Secondary | ICD-10-CM | POA: Insufficient documentation

## 2013-12-28 DIAGNOSIS — Z96649 Presence of unspecified artificial hip joint: Secondary | ICD-10-CM | POA: Insufficient documentation

## 2013-12-28 DIAGNOSIS — R911 Solitary pulmonary nodule: Secondary | ICD-10-CM

## 2013-12-28 DIAGNOSIS — C3490 Malignant neoplasm of unspecified part of unspecified bronchus or lung: Secondary | ICD-10-CM | POA: Diagnosis not present

## 2013-12-28 NOTE — Progress Notes (Signed)
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  Interval Since Last Radiation:  10  months  Narrative:  The patient returns today for routine follow-up.  She had follow-up PET for review.                              ALLERGIES:  is allergic to penicillins.  Meds: Current Outpatient Prescriptions  Medication Sig Dispense Refill  . acetaminophen (TYLENOL) 500 MG tablet Take 1,000 mg by mouth every 6 (six) hours as needed for pain.      Marland Kitchen amLODipine-benazepril (LOTREL) 5-20 MG per capsule Take 1 capsule by mouth daily before breakfast.       . atorvastatin (LIPITOR) 20 MG tablet Take 20 mg by mouth daily before breakfast.       . Coenzyme Q10 (CO Q 10) 100 MG CAPS Take 100 mg by mouth at bedtime.       Marland Kitchen etodolac (LODINE) 400 MG tablet Take 400 mg by mouth 2 (two) times daily.      . hydrochlorothiazide (HYDRODIURIL) 25 MG tablet Take 25 mg by mouth daily before breakfast.       . ibuprofen (ADVIL,MOTRIN) 200 MG tablet Take 400 mg by mouth every 6 (six) hours as needed for pain.       No current facility-administered medications for this encounter.    Physical Findings: The patient is in no acute distress. Patient is alert and oriented.  vitals were not taken for this visit..  No significant changes.  Lab Findings: Lab Results  Component Value Date   WBC 6.2 02/16/2013   HGB 13.3 02/16/2013   HCT 39.4 02/16/2013   MCV 94.5 02/16/2013   PLT 263 02/16/2013    @LASTCHEM @  Radiographic Findings: Ct Chest Wo Contrast  12/13/2013   CLINICAL DATA:  Lung cancer.  EXAM: CT CHEST WITHOUT CONTRAST  TECHNIQUE: Multidetector CT imaging of the chest was  performed following the standard protocol without IV contrast.  COMPARISON:  06/13/2013.  FINDINGS: The chest wall is unremarkable and stable. Mild interstitial changes and blurring of the fat between the pectoralis major and minor muscles on the left side may be due to prior port placement or radiation therapy. No breast masses, supraclavicular or axillary lymphadenopathy.  The bony thorax is intact. No destructive bone lesions or significant spinal canal compromise. Moderate degenerative changes throughout the thoracic spine. The sternum is intact. No obvious rib lesions.  The heart is normal in size. No pericardial effusion. No mediastinal or hilar mass or adenopathy. Stable advanced atherosclerotic calcifications involving the aorta and coronary arteries. The esophagus is grossly normal.  Examination of the lung parenchyma demonstrates stable radiation changes in the left upper lobe. The left upper lobe lung mass measures 10 x 8 mm. It previously measured 11 x 10 mm. Stable underlying paraseptal emphysema. In the superior segment of the left lower lobe there is a stable ill-defined somewhat lobulated airspace opacity with air bronchograms. This has enlarged since the prior study and could not exclude a slow-growing adenocarcinoma. Biopsy is recommended. Stable subpleural interstitial lung disease. Stable small  right upper lobe pulmonary nodules. No new pulmonary lesions. The tracheobronchial tree is stable. No bronchiectasis. No pleural effusion.  The upper abdomen is unremarkable.  IMPRESSION: 1. Stable to slightly smaller left upper lobe pulmonary lesion with surrounding radiation changes. 2. Enlarging nodular lobulated airspace opacity in the left lower lobe worrisome for slow growing adenocarcinoma. Recommend biopsy. 3. Stable chronic lung changes with paraseptal emphysema and subpleural interstitial disease. 4. Stable small right upper lobe pulmonary nodules and 5 mm left lower lobe pulmonary nodule (image  34). 5. No mediastinal or hilar adenopathy. 6. Stable ill-defined interstitial changes in the left anterior chest wall likely due to radiation.   Electronically Signed   By: Kalman Jewels M.D.   On: 12/13/2013 12:20    Impression:  The patient has a new hypermetabolic nodule in the left lower lung.  Plan:  CT-biopsy of lung nodule.  _____________________________________  Sheral Apley. Tammi Klippel, M.D.

## 2013-12-29 ENCOUNTER — Encounter: Payer: Self-pay | Admitting: *Deleted

## 2014-01-01 ENCOUNTER — Encounter: Payer: Self-pay | Admitting: Radiation Oncology

## 2014-01-01 ENCOUNTER — Other Ambulatory Visit: Payer: Self-pay | Admitting: Radiation Oncology

## 2014-01-01 DIAGNOSIS — C3490 Malignant neoplasm of unspecified part of unspecified bronchus or lung: Secondary | ICD-10-CM

## 2014-01-01 DIAGNOSIS — R911 Solitary pulmonary nodule: Secondary | ICD-10-CM

## 2014-01-02 ENCOUNTER — Encounter (HOSPITAL_COMMUNITY): Payer: Self-pay | Admitting: Pharmacy Technician

## 2014-01-02 ENCOUNTER — Telehealth: Payer: Self-pay | Admitting: *Deleted

## 2014-01-02 ENCOUNTER — Other Ambulatory Visit: Payer: Self-pay | Admitting: Radiology

## 2014-01-02 NOTE — Telephone Encounter (Signed)
CALLED PATIENT TO INFORM OF BIOPSY ON 01-04-14 AT 11:00 AM AND HER FU VISIT WITH DR. MANNING ON 01-11-14 @ 10 AM , SPOKE WITH PATIENT AND SHE IS AWARE OF THESE APPTS.

## 2014-01-04 ENCOUNTER — Ambulatory Visit (HOSPITAL_COMMUNITY)
Admission: RE | Admit: 2014-01-04 | Discharge: 2014-01-04 | Disposition: A | Discharge status: Home / Self Care | Payer: Medicare Other | Source: Ambulatory Visit | Attending: Diagnostic Radiology | Admitting: Diagnostic Radiology

## 2014-01-04 ENCOUNTER — Ambulatory Visit (HOSPITAL_COMMUNITY)
Admission: RE | Admit: 2014-01-04 | Discharge: 2014-01-04 | Disposition: A | Discharge status: Home / Self Care | Payer: Medicare Other | Source: Ambulatory Visit | Attending: Radiation Oncology | Admitting: Radiation Oncology

## 2014-01-04 ENCOUNTER — Encounter (HOSPITAL_COMMUNITY): Payer: Self-pay

## 2014-01-04 VITALS — BP 129/78 | HR 67 | Temp 97.1°F | Resp 20

## 2014-01-04 DIAGNOSIS — C3490 Malignant neoplasm of unspecified part of unspecified bronchus or lung: Secondary | ICD-10-CM

## 2014-01-04 DIAGNOSIS — C3430 Malignant neoplasm of lower lobe, unspecified bronchus or lung: Secondary | ICD-10-CM | POA: Diagnosis not present

## 2014-01-04 DIAGNOSIS — Z923 Personal history of irradiation: Secondary | ICD-10-CM | POA: Insufficient documentation

## 2014-01-04 DIAGNOSIS — I10 Essential (primary) hypertension: Secondary | ICD-10-CM | POA: Insufficient documentation

## 2014-01-04 DIAGNOSIS — Z96649 Presence of unspecified artificial hip joint: Secondary | ICD-10-CM | POA: Diagnosis not present

## 2014-01-04 DIAGNOSIS — Z87891 Personal history of nicotine dependence: Secondary | ICD-10-CM | POA: Diagnosis not present

## 2014-01-04 DIAGNOSIS — R9389 Abnormal findings on diagnostic imaging of other specified body structures: Secondary | ICD-10-CM

## 2014-01-04 DIAGNOSIS — R911 Solitary pulmonary nodule: Secondary | ICD-10-CM

## 2014-01-04 DIAGNOSIS — Z79899 Other long term (current) drug therapy: Secondary | ICD-10-CM | POA: Insufficient documentation

## 2014-01-04 DIAGNOSIS — E785 Hyperlipidemia, unspecified: Secondary | ICD-10-CM | POA: Insufficient documentation

## 2014-01-04 DIAGNOSIS — R222 Localized swelling, mass and lump, trunk: Secondary | ICD-10-CM | POA: Diagnosis not present

## 2014-01-04 DIAGNOSIS — J479 Bronchiectasis, uncomplicated: Secondary | ICD-10-CM | POA: Diagnosis not present

## 2014-01-04 LAB — CBC
HCT: 38.8 % (ref 36.0–46.0)
Hemoglobin: 13.5 g/dL (ref 12.0–15.0)
MCH: 33.2 pg (ref 26.0–34.0)
MCHC: 34.8 g/dL (ref 30.0–36.0)
MCV: 95.3 fL (ref 78.0–100.0)
PLATELETS: 223 10*3/uL (ref 150–400)
RBC: 4.07 MIL/uL (ref 3.87–5.11)
RDW: 13.1 % (ref 11.5–15.5)
WBC: 6.1 10*3/uL (ref 4.0–10.5)

## 2014-01-04 LAB — APTT: aPTT: 29 seconds (ref 24–37)

## 2014-01-04 LAB — PROTIME-INR
INR: 1.08 (ref 0.00–1.49)
PROTHROMBIN TIME: 13.8 s (ref 11.6–15.2)

## 2014-01-04 MED ORDER — MIDAZOLAM HCL 2 MG/2ML IJ SOLN
INTRAMUSCULAR | Status: AC
  Filled 2014-01-04: qty 4

## 2014-01-04 MED ORDER — SODIUM CHLORIDE 0.9 % IV SOLN
INTRAVENOUS | Status: DC
  Administered 2014-01-04: 09:00:00 via INTRAVENOUS

## 2014-01-04 MED ORDER — MIDAZOLAM HCL 2 MG/2ML IJ SOLN
INTRAMUSCULAR | Status: AC | PRN
  Administered 2014-01-04: 1 mg via INTRAVENOUS
  Administered 2014-01-04: 2 mg via INTRAVENOUS
  Administered 2014-01-04: 1 mg via INTRAVENOUS

## 2014-01-04 MED ORDER — HYDROCODONE-ACETAMINOPHEN 5-325 MG PO TABS
1.0000 | ORAL_TABLET | ORAL | Status: DC | PRN
  Filled 2014-01-04: qty 2

## 2014-01-04 MED ORDER — FENTANYL CITRATE 0.05 MG/ML IJ SOLN
INTRAMUSCULAR | Status: AC
  Filled 2014-01-04: qty 4

## 2014-01-04 MED ORDER — FENTANYL CITRATE 0.05 MG/ML IJ SOLN
INTRAMUSCULAR | Status: AC | PRN
  Administered 2014-01-04: 100 ug via INTRAVENOUS
  Administered 2014-01-04 (×2): 50 ug via INTRAVENOUS

## 2014-01-04 NOTE — Discharge Instructions (Signed)
Needle Biopsy of Lung, Care After Refer to this sheet in the next few weeks. These instructions provide you with information on caring for yourself after your procedure. Your health care provider may also give you more specific instructions. Your treatment has been planned according to current medical practices, but problems sometimes occur. Call your health care provider if you have any problems or questions after your procedure. WHAT TO EXPECT AFTER THE PROCEDURE A bandage will be applied over the areas where the needle was inserted. You may be asked to apply pressure to the bandage for several minutes to ensure there is minimal bleeding. In most cases, you can leave when your needle biopsy procedure is completed. Do not drive yourself home. Someone else should take you home. If you received an IV sedative or general anesthetic, you will be taken to a comfortable place to relax while the medication wears off. If you have upcoming travel scheduled, talk to your doctor about when it is safe to travel by air after the procedure. HOME CARE INSTRUCTIONS Expect to take it easy for the rest of the day. Protect the area where you received the needle biopsy by keeping the bandage in place for as long as instructed. You may feel some mild pain or discomfort in the area, but this should stop in a day or two. Only take over-the-counter or prescription medicines for pain, discomfort, or fever as directed by your caregiver. SEEK MEDICAL CARE IF:   You have pain at the biopsy site that worsens or is not helped by medication.  You have swelling or drainage at the needle biopsy site.  You have a fever. SEEK IMMEDIATE MEDICAL CARE IF:   You have new or worsening shortness of breath.  You have chest pain.  You are coughing up blood.  You have bleeding that does not stop with pressure or a bandage.  You develop light-headedness or fainting. Document Released: 09/13/2007 Document Revised: 07/19/2013 Document  Reviewed: 04/10/2013 [facility] Patient Information 2014 [place]. Conscious Sedation, Adult, Care After Refer to this sheet in the next few weeks. These instructions provide you with information on caring for yourself after your procedure. Your health care provider may also give you more specific instructions. Your treatment has been planned according to current medical practices, but problems sometimes occur. Call your health care provider if you have any problems or questions after your procedure. WHAT TO EXPECT AFTER THE PROCEDURE  After your procedure:  You may feel sleepy, clumsy, and have poor balance for several hours.  Vomiting may occur if you eat too soon after the procedure. HOME CARE INSTRUCTIONS  Do not participate in any activities where you could become injured for at least 24 hours. Do not:  Drive.  Swim.  Ride a bicycle.  Operate heavy machinery.  Cook.  Use power tools.  Climb ladders.  Work from a high place.  Do not make important decisions or sign legal documents until you are improved.  If you vomit, drink water, juice, or soup when you can drink without vomiting. Make sure you have little or no nausea before eating solid foods.  Only take over-the-counter or prescription medicines for pain, discomfort, or fever as directed by your health care provider.  Make sure you and your family fully understand everything about the medicines given to you, including what side effects may occur.  You should not drink alcohol, take sleeping pills, or take medicines that cause drowsiness for at least 24 hours.  If you  smoke, do not smoke without supervision.  If you are feeling better, you may resume normal activities 24 hours after you were sedated.  Keep all appointments with your health care provider. SEEK MEDICAL CARE IF:  Your skin is pale or bluish in color.  You continue to feel nauseous or vomit.  Your pain is getting worse and is not helped by  medicine.  You have bleeding or swelling.  You are still sleepy or feeling clumsy after 24 hours. SEEK IMMEDIATE MEDICAL CARE IF:  You develop a rash.  You have difficulty breathing.  You develop any type of allergic problem.  You have a fever. MAKE SURE YOU:  Understand these instructions.  Will watch your condition.  Will get help right away if you are not doing well or get worse. Document Released: 09/06/2013 Document Reviewed: 06/23/2013 [facility] Patient Information 2014 [place], [place].

## 2014-01-04 NOTE — Procedures (Signed)
CT guided left lung lesion biopsy.  2 FNAs and 1 core obtained.  Pleural air was aspirated during the biopsy but no significant pneumothorax at end of biopsy.

## 2014-01-04 NOTE — H&P (Signed)
Chief Complaint: "I'm here for a lung biopsy" Referring Physician:Manning HPI: [name] [name] is an 73 y.o. female with hx of LUL lung cancer, previously treated with radiation therapy. She now has enlarging LLL lesion on recent PET concerning for malignancy. She is referred for biopsy. She doesn't recall much from her previous lung mass biopsy but thinks she did well. PMHx and meds reviewed.  Past Medical History:  Past Medical History  Diagnosis Date  . Hypertension   . Arthritis     a. 10/2012 s/p Right THA.  Marland Kitchen Hyperlipidemia   . Pulmonary nodule     a. 10/2012 CT: 32mm anterior LUL nodule w/ spiculated appearance - rec PET CT.  Marland Kitchen Allergy   . History of ARDS   . Lung cancer 02/16/13    LUL lung=nscca  . Anxiety     Past Surgical History:  Past Surgical History  Procedure Laterality Date  . Tonsillectomy    . Total hip arthroplasty  11/03/2012    Procedure: TOTAL HIP ARTHROPLASTY ANTERIOR APPROACH;  Surgeon: Mcarthur Rossetti, MD;  Location: [facility];  Service: Orthopedics;  Laterality: Right;  Right Total Hip Arthroplasty, Anterior Approach (C-Arm)  . Tubal ligation    . Lung biopsy Left 02/16/13    LUL =non small cell ca with assoc/necrosis    Family History:  Family History  Problem Relation Age of Onset  . CAD Father     Social History:  reports that she quit smoking about 14 months ago. Her smoking use included Cigarettes. She has a 40 pack-year smoking history. She does not have any smokeless tobacco history on file. She reports that she drinks alcohol. She reports that she does not use illicit drugs.  Allergies:  Allergies  Allergen Reactions  . Penicillins Hives and Swelling    Medications:   Medication List    ASK your doctor about these medications       acetaminophen 500 MG tablet  Commonly known as:  TYLENOL  Take 1,000 mg by mouth every 6 (six) hours as needed for pain.     amLODipine-benazepril 5-20 MG per capsule  Commonly known as:  LOTREL   Take 1 capsule by mouth daily before breakfast.     atorvastatin 40 MG tablet  Commonly known as:  LIPITOR  Take 20 mg by mouth every morning.     Co Q 10 100 MG Caps  Take 100 mg by mouth at bedtime.     hydrochlorothiazide 25 MG tablet  Commonly known as:  HYDRODIURIL  Take 25 mg by mouth daily before breakfast.     ibuprofen 200 MG tablet  Commonly known as:  ADVIL,MOTRIN  Take 400 mg by mouth every 6 (six) hours as needed for pain.        Please HPI for pertinent positives, otherwise complete 10 system ROS negative.  Physical Exam: Temp: 97, HR: 77, RR: 18, BP: 166/87, O2: 96%   General Appearance:  Alert, cooperative, no distress, appears stated age  Head:  Normocephalic, without obvious abnormality, atraumatic  ENT: Unremarkable  Neck: Supple, symmetrical, trachea midline  Lungs:   Clear to auscultation bilaterally, no w/r/r, respirations unlabored without use of accessory muscles.  Chest Wall:  No tenderness or deformity  Heart:  Regular rate and rhythm, S1, S2 normal, no murmur, rub or gallop.  Abdomen:   Soft, non-tender, non distended.  Extremities: Extremities normal, atraumatic, no cyanosis or edema  Pulses: 2+ and symmetric  Neurologic: Normal affect, no gross deficits.  Results for orders placed during the hospital encounter of 01/04/14 (from the past 48 hour(s))  APTT     Status: None   Collection Time    01/04/14  9:10 AM      Result Value Range   aPTT 29  24 - 37 seconds  CBC     Status: None   Collection Time    01/04/14  9:10 AM      Result Value Range   WBC 6.1  4.0 - 10.5 K/uL   RBC 4.07  3.87 - 5.11 MIL/uL   Hemoglobin 13.5  12.0 - 15.0 g/dL   HCT 38.8  36.0 - 46.0 %   MCV 95.3  78.0 - 100.0 fL   MCH 33.2  26.0 - 34.0 pg   MCHC 34.8  30.0 - 36.0 g/dL   RDW 13.1  11.5 - 15.5 %   Platelets 223  150 - 400 K/uL  PROTIME-INR     Status: None   Collection Time    01/04/14  9:10 AM      Result Value Range   Prothrombin Time 13.8  11.6 -  15.2 seconds   INR 1.08  0.00 - 1.49   No results found.  Assessment/Plan Left lower lung mass  For CT guided biopsy today. Discussed procedure, risks, complications such as infection, bleeding, PTX requiring chest and possible admission.  Labs reviewed. Consent signed in chart   Ascencion Dike PA-C 01/04/2014, 10:06 AM

## 2014-01-08 ENCOUNTER — Telehealth: Payer: Self-pay | Admitting: Radiation Oncology

## 2014-01-08 NOTE — Telephone Encounter (Signed)
Received call from patient. She explains Dr. Tammi Klippel attempted to reach her on Friday with biopsy results. She questions if Dr. Tammi Klippel wants to try and call her again or if she should just wait till her appointment on Thursday. Informed Dr. Tammi Klippel of this conversation and provided him with her contact numbers.

## 2014-01-10 ENCOUNTER — Encounter: Payer: Self-pay | Admitting: Radiation Oncology

## 2014-01-10 ENCOUNTER — Ambulatory Visit
Admission: RE | Admit: 2014-01-10 | Discharge: 2014-01-10 | Disposition: A | Discharge status: Home / Self Care | Payer: Medicare Other | Source: Ambulatory Visit | Attending: Radiation Oncology | Admitting: Radiation Oncology

## 2014-01-10 DIAGNOSIS — Z79899 Other long term (current) drug therapy: Secondary | ICD-10-CM | POA: Diagnosis not present

## 2014-01-10 DIAGNOSIS — C3490 Malignant neoplasm of unspecified part of unspecified bronchus or lung: Secondary | ICD-10-CM

## 2014-01-10 DIAGNOSIS — C3430 Malignant neoplasm of lower lobe, unspecified bronchus or lung: Secondary | ICD-10-CM | POA: Diagnosis not present

## 2014-01-10 DIAGNOSIS — Z510 Encounter for antineoplastic radiation therapy: Secondary | ICD-10-CM | POA: Diagnosis not present

## 2014-01-10 NOTE — Progress Notes (Signed)
  Radiation Oncology         [phone]) [phone] ________________________________  Name: [name] [name] MRN: [id number]  Date: 01/10/2014  DOB: 1941/03/07  STEREOTACTIC BODY RADIOTHERAPY SIMULATION AND TREATMENT PLANNING NOTE  DIAGNOSIS:  73 yo woman with a 2.5 cm stage I adenocarcinoma of the left lower lung  NARRATIVE:  The patient was brought to the [place].  Identity was confirmed.  All relevant records and images related to the planned course of therapy were reviewed.  The patient freely provided informed written consent to proceed with treatment after reviewing the details related to the planned course of therapy. The consent form was witnessed and verified by the simulation staff.  Then, the patient was set-up in a stable reproducible  supine position for radiation therapy.  A BodyFix immobilization pillow was fabricated for reproducible positioning.  Then I personally applied the abdominal compression paddle to limit respiratory excursion.  4D respiratoy motion management CT images were obtained.  Surface markings were placed.  The CT images were loaded into the planning software.  Then, using Cine, MIP, and standard views, the internal target volume (ITV) and planning target volumes (PTV) were delinieated, and avoidance structures were contoured.  Treatment planning then occurred.  The radiation prescription was entered and confirmed.  A total of two complex treatment devices were fabricated in the form of the BodyFix immobilization pillow and a neck accuform cushion.  I have requested : 3D Simulation  I have requested a DVH of the following structures: Heart, Lungs, Esophagus, Chest Wall, Brachial Plexus, Major Blood Vessels, and targets.  PLAN:  The patient will receive 54 Gy in 3 fractions.  ________________________________  Sheral Apley Tammi Klippel, M.D.

## 2014-01-11 ENCOUNTER — Ambulatory Visit: Payer: Medicare Other | Admitting: Radiation Oncology

## 2014-01-13 DIAGNOSIS — Z79899 Other long term (current) drug therapy: Secondary | ICD-10-CM | POA: Diagnosis not present

## 2014-01-13 DIAGNOSIS — C3430 Malignant neoplasm of lower lobe, unspecified bronchus or lung: Secondary | ICD-10-CM | POA: Diagnosis not present

## 2014-01-13 DIAGNOSIS — Z510 Encounter for antineoplastic radiation therapy: Secondary | ICD-10-CM | POA: Diagnosis not present

## 2014-01-15 DIAGNOSIS — C3430 Malignant neoplasm of lower lobe, unspecified bronchus or lung: Secondary | ICD-10-CM | POA: Diagnosis not present

## 2014-01-15 DIAGNOSIS — Z510 Encounter for antineoplastic radiation therapy: Secondary | ICD-10-CM | POA: Diagnosis not present

## 2014-01-15 DIAGNOSIS — Z79899 Other long term (current) drug therapy: Secondary | ICD-10-CM | POA: Diagnosis not present

## 2014-01-19 ENCOUNTER — Other Ambulatory Visit (HOSPITAL_COMMUNITY)
Admission: RE | Admit: 2014-01-19 | Discharge: 2014-01-19 | Disposition: A | Discharge status: Home / Self Care | Payer: Medicare Other | Source: Ambulatory Visit | Attending: Internal Medicine | Admitting: Internal Medicine

## 2014-01-19 DIAGNOSIS — C3410 Malignant neoplasm of upper lobe, unspecified bronchus or lung: Secondary | ICD-10-CM | POA: Diagnosis not present

## 2014-01-22 ENCOUNTER — Ambulatory Visit
Admission: RE | Admit: 2014-01-22 | Discharge: 2014-01-22 | Disposition: A | Discharge status: Home / Self Care | Payer: Medicare Other | Source: Ambulatory Visit | Attending: Radiation Oncology | Admitting: Radiation Oncology

## 2014-01-22 DIAGNOSIS — R911 Solitary pulmonary nodule: Secondary | ICD-10-CM

## 2014-01-22 DIAGNOSIS — C3490 Malignant neoplasm of unspecified part of unspecified bronchus or lung: Secondary | ICD-10-CM

## 2014-01-22 DIAGNOSIS — C3430 Malignant neoplasm of lower lobe, unspecified bronchus or lung: Secondary | ICD-10-CM | POA: Diagnosis not present

## 2014-01-22 DIAGNOSIS — Z79899 Other long term (current) drug therapy: Secondary | ICD-10-CM | POA: Diagnosis not present

## 2014-01-22 DIAGNOSIS — Z510 Encounter for antineoplastic radiation therapy: Secondary | ICD-10-CM | POA: Diagnosis not present

## 2014-01-22 NOTE — Progress Notes (Signed)
  Radiation Oncology         [phone] ________________________________  Name: [name] [name] MRN: [id number]  Date: 01/22/2014  DOB: June 05, 1941  Stereotactic Body Radiotherapy Treatment Procedure Note  CURRENT FRACTION:    1  PLANNED FRACTIONS:  3  NARRATIVE:  [name] [name] was brought to the stereotactic radiation treatment machine and placed supine on the CT couch. The patient was set up for stereotactic body radiotherapy on the body fix pillow.  3D TREATMENT PLANNING AND DOSIMETRY:  The patient's radiation plan was reviewed and approved prior to starting treatment.  It showed 3-dimensional radiation distributions overlaid onto the planning CT.  [facility] for the target structures as well as the organs at risk were reviewed. The documentation of this is filed in the radiation oncology EMR.  SIMULATION VERIFICATION:  The patient underwent CT imaging on the treatment unit.  These were carefully aligned to document that the ablative radiation dose would cover the target volume and maximally spare the nearby organs at risk according to the planned distribution.  SPECIAL TREATMENT PROCEDURE: [name] [name] received high dose ablative stereotactic body radiotherapy to the planned target volume without unforeseen complications. Treatment was delivered uneventfully. The high doses associated with stereotactic body radiotherapy and the significant potential risks require careful treatment set up and patient monitoring constituting a special treatment procedure   STEREOTACTIC TREATMENT MANAGEMENT:  Following delivery, the patient was evaluated clinically. The patient tolerated treatment without significant acute effects, and was discharged to home in stable condition.    PLAN: Continue treatment as planned.  ________________________________  Sheral Apley. Tammi Klippel, M.D.

## 2014-01-24 ENCOUNTER — Encounter: Payer: Self-pay | Admitting: Radiation Oncology

## 2014-01-24 ENCOUNTER — Ambulatory Visit
Admission: RE | Admit: 2014-01-24 | Discharge: 2014-01-24 | Disposition: A | Discharge status: Home / Self Care | Payer: Medicare Other | Source: Ambulatory Visit | Attending: Radiation Oncology | Admitting: Radiation Oncology

## 2014-01-24 DIAGNOSIS — C3490 Malignant neoplasm of unspecified part of unspecified bronchus or lung: Secondary | ICD-10-CM

## 2014-01-24 DIAGNOSIS — Z510 Encounter for antineoplastic radiation therapy: Secondary | ICD-10-CM | POA: Diagnosis not present

## 2014-01-24 DIAGNOSIS — C3430 Malignant neoplasm of lower lobe, unspecified bronchus or lung: Secondary | ICD-10-CM | POA: Diagnosis not present

## 2014-01-24 DIAGNOSIS — Z79899 Other long term (current) drug therapy: Secondary | ICD-10-CM | POA: Diagnosis not present

## 2014-01-24 NOTE — Progress Notes (Signed)
  Radiation Oncology         (336) [phone] ________________________________  Name: Lizann Edelman MRN: [id number]  Date: 01/24/2014  DOB: 1941-01-03  Stereotactic Body Radiotherapy Treatment Procedure Note  CURRENT FRACTION:    2  PLANNED FRACTIONS:  3  NARRATIVE:  Neeka Urista was brought to the stereotactic radiation treatment machine and placed supine on the CT couch. The patient was set up for stereotactic body radiotherapy on the body fix pillow.  3D TREATMENT PLANNING AND DOSIMETRY:  The patient's radiation plan was reviewed and approved prior to starting treatment.  It showed 3-dimensional radiation distributions overlaid onto the planning CT.  The [facility] for the target structures as well as the organs at risk were reviewed. The documentation of this is filed in the radiation oncology EMR.  SIMULATION VERIFICATION:  The patient underwent CT imaging on the treatment unit.  These were carefully aligned to document that the ablative radiation dose would cover the target volume and maximally spare the nearby organs at risk according to the planned distribution.  SPECIAL TREATMENT PROCEDURE: [name] received high dose ablative stereotactic body radiotherapy to the planned target volume without unforeseen complications. Treatment was delivered uneventfully. The high doses associated with stereotactic body radiotherapy and the significant potential risks require careful treatment set up and patient monitoring constituting a special treatment procedure   STEREOTACTIC TREATMENT MANAGEMENT:  Following delivery, the patient was evaluated clinically. The patient tolerated treatment without significant acute effects, and was discharged to home in stable condition.    PLAN: Continue treatment as planned.  ________________________________  Sheral Apley. Tammi Klippel, M.D.

## 2014-01-25 ENCOUNTER — Encounter (HOSPITAL_COMMUNITY): Payer: Self-pay

## 2014-01-26 ENCOUNTER — Ambulatory Visit: Admission: RE | Admit: 2014-01-26 | Payer: Medicare Other | Source: Ambulatory Visit | Admitting: Radiation Oncology

## 2014-01-26 ENCOUNTER — Encounter: Payer: Self-pay | Admitting: Medical Oncology

## 2014-01-26 ENCOUNTER — Encounter (HOSPITAL_COMMUNITY): Payer: Self-pay

## 2014-01-26 NOTE — Progress Notes (Signed)
EGFR TKI assay -alteration not detected in LLL nodule tissue. 

## 2014-01-29 ENCOUNTER — Encounter: Payer: Self-pay | Admitting: Radiation Oncology

## 2014-01-29 ENCOUNTER — Ambulatory Visit
Admission: RE | Admit: 2014-01-29 | Discharge: 2014-01-29 | Disposition: A | Discharge status: Home / Self Care | Payer: Medicare Other | Source: Ambulatory Visit | Attending: Radiation Oncology | Admitting: Radiation Oncology

## 2014-01-29 VITALS — BP 155/88 | HR 97 | Temp 98.0°F | Ht 62.25 in | Wt 134.5 lb

## 2014-01-29 DIAGNOSIS — Z510 Encounter for antineoplastic radiation therapy: Secondary | ICD-10-CM | POA: Diagnosis not present

## 2014-01-29 DIAGNOSIS — C3490 Malignant neoplasm of unspecified part of unspecified bronchus or lung: Secondary | ICD-10-CM

## 2014-01-29 DIAGNOSIS — C3430 Malignant neoplasm of lower lobe, unspecified bronchus or lung: Secondary | ICD-10-CM | POA: Diagnosis not present

## 2014-01-29 DIAGNOSIS — Z79899 Other long term (current) drug therapy: Secondary | ICD-10-CM | POA: Diagnosis not present

## 2014-01-29 NOTE — Progress Notes (Signed)
[name] [name] has completed 3/3 fractions to her left lung.  She denies any pain nor SOB, but admits to some fatigue with daily naps.    She is asking when she will have a follow-up scan.

## 2014-01-30 DIAGNOSIS — M25559 Pain in unspecified hip: Secondary | ICD-10-CM | POA: Diagnosis not present

## 2014-01-30 DIAGNOSIS — M1610 Unilateral primary osteoarthritis, unspecified hip: Secondary | ICD-10-CM | POA: Diagnosis not present

## 2014-01-30 DIAGNOSIS — M169 Osteoarthritis of hip, unspecified: Secondary | ICD-10-CM | POA: Diagnosis not present

## 2014-01-30 NOTE — Progress Notes (Signed)
  Radiation Oncology         [phone] ________________________________  Name: [name] [name] MRN: [id number]  Date: 01/29/2014  DOB: 04-09-1941   Simulation verification note  The patient underwent film verification for the patient's set-up in preparation for stereotactic body radiosurgery. The patient was placed on the treatment unit and a CT scan was performed. These images were then fused with the patient's planning CT scan. The fusion was carefully reviewed in terms of the patient's anatomy as it related to the planning CT scan. The target structures as well as the organs at risk were evaluated on the patient's treatment CT scan. The target and the normal structures were appropriately aligned for treatment. Therefore the patient proceeded with the fraction of stereotactic body radiosurgery.  Fraction: 3  Dose:  54 Gy   ________________________________  Jodelle Gross, MD, PhD

## 2014-01-30 NOTE — Progress Notes (Signed)
   Department of Radiation Oncology  Phone:  [phone] Fax:        [phone]  Weekly Treatment Note    Name: [name] [name] Date: 01/30/2014 MRN: [id number] DOB: [birth year]   Current dose: 54 Gy  Current fraction: 3   MEDICATIONS: Current Outpatient Prescriptions  Medication Sig Dispense Refill  . acetaminophen (TYLENOL) 500 MG tablet Take 1,000 mg by mouth every 6 (six) hours as needed for pain.      Marland Kitchen amLODipine-benazepril (LOTREL) 5-20 MG per capsule Take 1 capsule by mouth daily before breakfast.       . atorvastatin (LIPITOR) 40 MG tablet Take 20 mg by mouth every morning.      . Coenzyme Q10 (CO Q 10) 100 MG CAPS Take 100 mg by mouth at bedtime.       . hydrochlorothiazide (HYDRODIURIL) 25 MG tablet Take 25 mg by mouth daily before breakfast.       . ibuprofen (ADVIL,MOTRIN) 200 MG tablet Take 400 mg by mouth every 6 (six) hours as needed for pain.       No current facility-administered medications for this encounter.     ALLERGIES: Penicillins   LABORATORY DATA:  Lab Results  Component Value Date   WBC 6.1 01/04/2014   HGB 13.5 01/04/2014   HCT 38.8 01/04/2014   MCV 95.3 01/04/2014   PLT 223 01/04/2014   Lab Results  Component Value Date   NA 141 11/18/2012   K 3.5 11/18/2012   CL 103 11/18/2012   CO2 25 11/18/2012   Lab Results  Component Value Date   ALT 23 11/17/2012   AST 25 11/17/2012   ALKPHOS 73 11/17/2012   BILITOT 0.7 11/17/2012     NARRATIVE: [name] [name] was seen today for weekly treatment management. The chart was checked and the patient's films were reviewed. The patient completed her course of stereotactic body radiation treatment to the 2 lesions in the left and right lung as separate target areas. She has done well with treatment. No change in breathing. No chest discomfort or esophagitis.  PHYSICAL EXAMINATION: height is 5' 2.25" (1.581 m) and weight is 134 lb 8 oz (61.009 kg). Her temperature is 98 F (36.7 C). Her blood pressure  is 155/88 and her pulse is 97.        ASSESSMENT: The patient did satisfactorily with treatment.  PLAN: followup in one month with Dr. Tammi Klippel.

## 2014-01-31 ENCOUNTER — Ambulatory Visit: Payer: Medicare Other | Admitting: Radiation Oncology

## 2014-02-10 NOTE — Progress Notes (Signed)
°  Radiation Oncology         [phone]) [phone] ________________________________  Name: [name] [name] MRN: [id number]  Date: 01/29/2014  DOB: [birth year]  End of Treatment Note  Diagnosis:   73 yo woman with a 2.5 cm stage I adenocarcinoma of the left lower lung  Indication for treatment:  Curative       Radiation treatment dates:   01/22/2014, 01/24/2014, 01/29/2014  Site/dose:   The Lower lung mass was treated to 54 gray in 3 fractions of 18 gray  Beams/energy:   The patient was treated using cone beam CT image guided 3-D conformal radiotherapy using 2 volumetric ARC beams delivering 6 megavolt photons in the flattening filter free beam setting immobilization was achieved with a body fix custom bag and abdominal compression paddlel  Narrative: The patient tolerated radiation treatment relatively well.   No acute complications occurred.  Plan: The patient has completed radiation treatment. The patient will return to radiation oncology clinic for routine followup in one month. I advised them to call or return sooner if they have any questions or concerns related to their recovery or treatment. ________________________________  Sheral Apley. Tammi Klippel, M.D.

## 2014-03-08 ENCOUNTER — Ambulatory Visit
Admission: RE | Admit: 2014-03-08 | Discharge: 2014-03-08 | Disposition: A | Discharge status: Home / Self Care | Payer: Medicare Other | Source: Ambulatory Visit | Attending: Radiation Oncology | Admitting: Radiation Oncology

## 2014-03-08 ENCOUNTER — Encounter: Payer: Self-pay | Admitting: Radiation Oncology

## 2014-03-08 VITALS — BP 146/70 | HR 86 | Temp 97.8°F | Ht 62.5 in | Wt 131.1 lb

## 2014-03-08 DIAGNOSIS — C3430 Malignant neoplasm of lower lobe, unspecified bronchus or lung: Secondary | ICD-10-CM

## 2014-03-08 DIAGNOSIS — C3410 Malignant neoplasm of upper lobe, unspecified bronchus or lung: Secondary | ICD-10-CM

## 2014-03-08 NOTE — Assessment & Plan Note (Signed)
Completed SBRT

## 2014-03-08 NOTE — Progress Notes (Signed)
[name] [name] here for follow up after treatment to her left lower lung.  She denies pain, coughing, hemoptysis and shortness of breath.  She reports some fatigue.

## 2014-03-08 NOTE — Progress Notes (Signed)
  Radiation Oncology         (336) [phone] ________________________________  Name: [name] [name] MRN: [id number]  Date: 03/08/2014  DOB: [birth year]  Follow-Up Visit Note  CC: Donnie Coffin, MD  Rigoberto Noel, MD  Diagnosis:   73 yo woman with metachronous primary lung cancers:  18 mm squamous cell carcinoma of the left upper lobe of the lung - clinical stage IA  Completed SBRT - 03/01/2013, 03/03/2013, 03/08/2013 - to 54 Gy in 3 fractions 24.8 mm well differentiated adenocarcinoma of the left lower lobe of the lung - clinical stage IA  Completed SBRT2 - 01/22/2014, 01/24/2014, 01/29/2014  -  to 54 gray in 3 fractions  Interval Since Last Radiation:  5  weeks  Narrative:  The patient returns today for routine follow-up.  She denies pain, coughing, hemoptysis and shortness of breath. She reports some fatigue.                             ALLERGIES:  is allergic to penicillins.  Meds: Current Outpatient Prescriptions  Medication Sig Dispense Refill  . acetaminophen (TYLENOL) 500 MG tablet Take 1,000 mg by mouth every 6 (six) hours as needed for pain.      Marland Kitchen amLODipine-benazepril (LOTREL) 5-20 MG per capsule Take 1 capsule by mouth daily before breakfast.       . atorvastatin (LIPITOR) 40 MG tablet Take 20 mg by mouth every morning.      . hydrochlorothiazide (HYDRODIURIL) 25 MG tablet Take 25 mg by mouth daily before breakfast.       . ibuprofen (ADVIL,MOTRIN) 200 MG tablet Take 400 mg by mouth every 6 (six) hours as needed for pain.      . Coenzyme Q10 (CO Q 10) 100 MG CAPS Take 100 mg by mouth at bedtime.        No current facility-administered medications for this encounter.    Physical Findings: The patient is in no acute distress. Patient is alert and oriented.  height is 5' 2.5" (1.588 m) and weight is 131 lb 1.6 oz (59.467 kg). Her temperature is 97.8 F (36.6 C). Her blood pressure is 146/70 and her pulse is 86. Her oxygen saturation is 97%. .  No significant changes.  Lab  Findings: Lab Results  Component Value Date   WBC 6.1 01/04/2014   HGB 13.5 01/04/2014   HCT 38.8 01/04/2014   MCV 95.3 01/04/2014   PLT 223 01/04/2014   Impression:  The patient is recovering from the effects of radiation.    Plan:  CT chest in 1 month then follow-up.  _____________________________________  Sheral Apley. Tammi Klippel, M.D.

## 2014-03-09 ENCOUNTER — Telehealth: Payer: Self-pay | Admitting: *Deleted

## 2014-03-09 NOTE — Telephone Encounter (Signed)
Called patient to inform of test and fu visit, spoke with patient and she is aware of these appts.

## 2014-03-14 DIAGNOSIS — Z23 Encounter for immunization: Secondary | ICD-10-CM | POA: Diagnosis not present

## 2014-03-14 DIAGNOSIS — I10 Essential (primary) hypertension: Secondary | ICD-10-CM | POA: Diagnosis not present

## 2014-03-14 DIAGNOSIS — E7800 Pure hypercholesterolemia, unspecified: Secondary | ICD-10-CM | POA: Diagnosis not present

## 2014-03-14 DIAGNOSIS — L2089 Other atopic dermatitis: Secondary | ICD-10-CM | POA: Diagnosis not present

## 2014-03-19 ENCOUNTER — Encounter: Payer: Self-pay | Admitting: *Deleted

## 2014-03-19 NOTE — [facility] Oncology Navigator Note (Signed)
I called patient to check in.  Patient reports that she is doing well.  She is recovering from the effects of her radiation treatment and reports that the fatigue is improving.  She denied any questions or concerns at this time.  I encouraged her to call me for any needs.

## 2014-04-10 ENCOUNTER — Ambulatory Visit (HOSPITAL_COMMUNITY)
Admission: RE | Admit: 2014-04-10 | Discharge: 2014-04-10 | Disposition: A | Discharge status: Home / Self Care | Payer: Medicare Other | Source: Ambulatory Visit | Attending: Radiation Oncology | Admitting: Radiation Oncology

## 2014-04-10 ENCOUNTER — Encounter (HOSPITAL_COMMUNITY): Payer: Self-pay

## 2014-04-10 DIAGNOSIS — C3430 Malignant neoplasm of lower lobe, unspecified bronchus or lung: Secondary | ICD-10-CM

## 2014-04-10 DIAGNOSIS — C3410 Malignant neoplasm of upper lobe, unspecified bronchus or lung: Secondary | ICD-10-CM | POA: Insufficient documentation

## 2014-04-10 DIAGNOSIS — Z923 Personal history of irradiation: Secondary | ICD-10-CM | POA: Insufficient documentation

## 2014-04-12 ENCOUNTER — Ambulatory Visit
Admission: RE | Admit: 2014-04-12 | Discharge: 2014-04-12 | Disposition: A | Discharge status: Home / Self Care | Payer: Medicare Other | Source: Ambulatory Visit | Attending: Radiation Oncology | Admitting: Radiation Oncology

## 2014-04-12 ENCOUNTER — Encounter: Payer: Self-pay | Admitting: Radiation Oncology

## 2014-04-12 VITALS — BP 148/75 | HR 62 | Temp 97.8°F | Ht 62.5 in | Wt 132.7 lb

## 2014-04-12 DIAGNOSIS — C3410 Malignant neoplasm of upper lobe, unspecified bronchus or lung: Secondary | ICD-10-CM

## 2014-04-12 DIAGNOSIS — C3430 Malignant neoplasm of lower lobe, unspecified bronchus or lung: Secondary | ICD-10-CM

## 2014-04-12 NOTE — Progress Notes (Signed)
Radiation Oncology         [phone] ________________________________  Name: [name] [name] MRN: [id number]  Date: 04/12/2014  DOB: 1941/11/19  Follow-Up Visit Note  CC: Donnie Coffin, MD  Rigoberto Noel, MD  Diagnosis:   73 yo woman with metachronous primary lung cancers:    18 mm squamous cell carcinoma of the left upper lobe of the lung - clinical stage IA    Completed SBRT - 03/01/2013, 03/03/2013, 03/08/2013 - to 54 Gy in 3 fractions    24.8 mm well differentiated adenocarcinoma of the left lower lobe of the lung - clinical stage IA    Completed SBRT2 - 01/22/2014, 01/24/2014, 01/29/2014 - to 54 gray in 3 fractions   Interval Since Last Radiation:  2  months  Narrative:  The patient returns today for routine follow-up.  She reports that since last week that she has a tingling/numbing sensation in left hand and her forearm. She denies any pain, headaches, ataxia, nausea nor SOB.                              ALLERGIES:  is allergic to penicillins.  Meds: Current Outpatient Prescriptions  Medication Sig Dispense Refill  . amLODipine-benazepril (LOTREL) 5-20 MG per capsule Take 1 capsule by mouth daily before breakfast.       . atorvastatin (LIPITOR) 40 MG tablet Take 20 mg by mouth every morning.      . hydrochlorothiazide (HYDRODIURIL) 25 MG tablet Take 25 mg by mouth daily before breakfast.       . acetaminophen (TYLENOL) 500 MG tablet Take 1,000 mg by mouth every 6 (six) hours as needed for pain.      . Coenzyme Q10 (CO Q 10) 100 MG CAPS Take 100 mg by mouth at bedtime.       Marland Kitchen ibuprofen (ADVIL,MOTRIN) 200 MG tablet Take 400 mg by mouth every 6 (six) hours as needed for pain.       No current facility-administered medications for this encounter.    Physical Findings: The patient is in no acute distress. Patient is alert and oriented.  height is 5' 2.5" (1.588 m) and weight is 132 lb 11.2 oz (60.192 kg). Her temperature is 97.8 F (36.6 C). Her blood pressure is 148/75 and  her pulse is 62. Her oxygen saturation is 98%. .  No significant changes.  Lab Findings: Lab Results  Component Value Date   WBC 6.1 01/04/2014   HGB 13.5 01/04/2014   HCT 38.8 01/04/2014   MCV 95.3 01/04/2014   PLT 223 01/04/2014    @LASTCHEM @  Radiographic Findings: Ct Chest Wo Contrast  04/10/2014   CLINICAL DATA:  Followup left upper lobe squamous cell carcinoma. Left lower lobe adenocarcinoma diagnosed in February of 2015.  EXAM: CT CHEST WITHOUT CONTRAST  TECHNIQUE: Multidetector CT imaging of the chest was performed following the standard protocol without IV contrast.  COMPARISON:  12/13/2013.  FINDINGS: The heart size is normal. No pericardial effusion identified. No mediastinal or hilar adenopathy identified. Calcified atherosclerotic disease involves the thoracic aorta as well as the LAD and left circumflex and RCA coronary arteries.  No pleural effusion identified. Peripheral interstitial reticulation and traction bronchiectasis is identified. Subpleural honeycombing is identified along the ventral aspects of the upper lobes bilaterally, image 22/ series 5. Index left upper lobe lung lesion measures 1 cm, image 18/series 5. This is unchanged from previous exam. New radiation change within the left midlung  is identified, image 27/series 5. The underlying lung lesion measures 2 cm, image 27/ series 5. Previously 2.5 cm 5 mm nodule in the left lower lobe is unchanged from previous exam, image 37/series 5. No new pulmonary lesions identified.  Review of the visualized bony structures is significant for multi level thoracic spondylosis.  Low attenuation structure within the inferior right hepatic lobe is unchanged measuring 8 mm, image 58/series 2. The adjacent low-attenuation structure, image 58/series 2 is also unchanged measuring 7 mm.  IMPRESSION: 1. Interval increased in radiation change within the left midlung in the area of newly treated adenocarcinoma. The index lesion has decreased in size from  previous exam. 2. Stable left upper lobe lesion with surrounding radiation change. 3. Subpleural interstitial lung disease is unchanged from previous exam.   Electronically Signed   By: Kerby Moors M.D.   On: 04/10/2014 13:57    Impression:  The patient has no evidence of recurrence or new lung nodules.  Plan:  Chest CT in 3 months, then follow-up.  _____________________________________  Sheral Apley. Tammi Klippel, M.D.

## 2014-04-12 NOTE — Progress Notes (Signed)
[name] [name] here for reassessment s/p radiation to her Left upper lobe (SBRT) which completed on 03/08/13 and for radiation to her left lower lobe (SBRT) which completed on 01/29/14.  She reports that  since last week that she has a tingling/numbing sensation in left hand and her forearm.  She denies any pain, headaches, ataxia, nausea nor SOB.

## 2014-04-13 ENCOUNTER — Telehealth: Payer: Self-pay | Admitting: *Deleted

## 2014-04-13 NOTE — Telephone Encounter (Signed)
CALLED PATIENT TO INFORM OF TEST AND FU VISIT, LVM FOR A RETURN CALL

## 2014-05-11 DIAGNOSIS — J019 Acute sinusitis, unspecified: Secondary | ICD-10-CM | POA: Diagnosis not present

## 2014-07-24 ENCOUNTER — Encounter (HOSPITAL_COMMUNITY): Payer: Self-pay

## 2014-07-24 ENCOUNTER — Ambulatory Visit (HOSPITAL_COMMUNITY)
Admission: RE | Admit: 2014-07-24 | Discharge: 2014-07-24 | Disposition: A | Discharge status: Home / Self Care | Payer: Medicare Other | Source: Ambulatory Visit | Attending: Radiation Oncology | Admitting: Radiation Oncology

## 2014-07-24 DIAGNOSIS — C3410 Malignant neoplasm of upper lobe, unspecified bronchus or lung: Secondary | ICD-10-CM | POA: Insufficient documentation

## 2014-07-24 DIAGNOSIS — C3430 Malignant neoplasm of lower lobe, unspecified bronchus or lung: Secondary | ICD-10-CM | POA: Insufficient documentation

## 2014-07-25 DIAGNOSIS — Z803 Family history of malignant neoplasm of breast: Secondary | ICD-10-CM | POA: Diagnosis not present

## 2014-07-25 DIAGNOSIS — Z1231 Encounter for screening mammogram for malignant neoplasm of breast: Secondary | ICD-10-CM | POA: Diagnosis not present

## 2014-07-26 ENCOUNTER — Ambulatory Visit: Payer: Medicare Other | Admitting: Radiation Oncology

## 2014-07-27 ENCOUNTER — Encounter: Payer: Self-pay | Admitting: Radiation Oncology

## 2014-07-27 ENCOUNTER — Ambulatory Visit
Admission: RE | Admit: 2014-07-27 | Discharge: 2014-07-27 | Disposition: A | Discharge status: Home / Self Care | Payer: Medicare Other | Source: Ambulatory Visit | Attending: Radiation Oncology | Admitting: Radiation Oncology

## 2014-07-27 ENCOUNTER — Telehealth: Payer: Self-pay | Admitting: *Deleted

## 2014-07-27 VITALS — BP 131/78 | HR 86 | Temp 97.7°F | Resp 18 | Wt 132.0 lb

## 2014-07-27 DIAGNOSIS — C3430 Malignant neoplasm of lower lobe, unspecified bronchus or lung: Secondary | ICD-10-CM

## 2014-07-27 DIAGNOSIS — C3410 Malignant neoplasm of upper lobe, unspecified bronchus or lung: Secondary | ICD-10-CM

## 2014-07-27 NOTE — Progress Notes (Addendum)
Denies chest pain. Weight and vitals stable. Gravely voice noted. Reports a sore throat but, difficulty swallowing. Dry cough noted. Reports a post nasal drip. Denies headache, dizziness, nausea, vomiting, or night sweats.

## 2014-07-27 NOTE — Telephone Encounter (Signed)
CALLED PATIENT TO INFORM OF TEST AND FU FOR FEB. 2015, SPOKE WITH PATIENT AND SHE IS AWARE OF THESE APPTS.

## 2014-07-27 NOTE — Progress Notes (Signed)
Radiation Oncology         [phone] ________________________________  Name: [name] [name] MRN: [id number]  Date: 07/27/2014  DOB: January 14, 1941  Follow-Up Visit Note  CC: Donnie Coffin, MD  Rigoberto Noel, MD  Diagnosis:   73 yo woman with metachronous primary lung cancers:    18 mm squamous cell carcinoma of the left upper lobe of the lung - clinical stage IA    Completed SBRT - 03/01/2013, 03/03/2013, 03/08/2013 - to 54 Gy in 3 fractions   24.8 mm well differentiated adenocarcinoma of the left lower lobe of the lung - clinical stage IA    Completed SBRT2 - 01/22/2014, 01/24/2014, 01/29/2014 - to 54 gray in 3 fractions  Interval Since Last Radiation:  6  months  Narrative:  The patient returns today for routine follow-up.  Denies chest pain. Weight and vitals stable. Gravely voice noted. Reports a sore throat but, difficulty swallowing. Dry cough noted. Reports a post nasal drip. Denies headache, dizziness, nausea, vomiting, or night sweats                             ALLERGIES:  is allergic to penicillins.  Meds: Current Outpatient Prescriptions  Medication Sig Dispense Refill  . acetaminophen (TYLENOL) 500 MG tablet Take 1,000 mg by mouth every 6 (six) hours as needed for pain.      Marland Kitchen amLODipine-benazepril (LOTREL) 5-20 MG per capsule Take 1 capsule by mouth daily before breakfast.       . atorvastatin (LIPITOR) 40 MG tablet Take 20 mg by mouth every morning.      . Coenzyme Q10 (CO Q 10) 100 MG CAPS Take 100 mg by mouth at bedtime.       . hydrochlorothiazide (HYDRODIURIL) 25 MG tablet Take 25 mg by mouth daily before breakfast.       . ibuprofen (ADVIL,MOTRIN) 200 MG tablet Take 400 mg by mouth every 6 (six) hours as needed for pain.       No current facility-administered medications for this encounter.    Physical Findings: The patient is in no acute distress. Patient is alert and oriented.  vitals were not taken for this visit..  No significant changes.  Lab Findings: Lab  Results  Component Value Date   WBC 6.1 01/04/2014   HGB 13.5 01/04/2014   HCT 38.8 01/04/2014   MCV 95.3 01/04/2014   PLT 223 01/04/2014    @LASTCHEM @  Radiographic Findings: Ct Chest Wo Contrast  07/24/2014   CLINICAL DATA:  Followup lung cancer.  EXAM: CT CHEST WITHOUT CONTRAST  TECHNIQUE: Multidetector CT imaging of the chest was performed following the standard protocol without IV contrast.  COMPARISON:  PET-CT 12/26/2013 and chest CT 04/10/2014  FINDINGS: The chest wall is stable. No breast masses, supraclavicular or axillary lymphadenopathy. Mild thickening and interstitial change involving the serratus anterior muscle on the left is likely due to radiation. The bony thorax is stable. No destructive bone lesions to suggest metastatic disease. Stable degenerative changes involving the spine.  The heart is stable. No pericardial effusion. Stable scattered mediastinal and hilar lymph nodes but no overt adenopathy. Stable advanced atherosclerotic calcifications involving the aorta and coronary arteries. The esophagus is grossly normal.  The lungs demonstrate stable radiation changes involving the left upper lobe anteriorly. The treated nodule appears stable. Significant progression of radiation fibrosis involving the superior segment of the left lower lobe. The pulmonary lesion is difficult identified. The left lower lobe  pulmonary nodule on image number 30 is stable at 5 mm. Underlying interstitial lung disease is suspected with peripheral interstitial thickening in both lungs. No new pulmonary lesions. No pleural effusion.  The upper abdomen is unremarkable and stable.  IMPRESSION: 1. Progression of radiation changes involving the left lower lobe. The lesion is now difficult to identify and measure. 2. Stable left upper lobe radiation changes and stable nodular lesion measuring 9.5 mm. 3. Stable underline paraseptal emphysema and peripheral interstitial lung disease. 4. Stable mediastinal and hilar lymph  nodes.   Electronically Signed   By: Kalman Jewels M.D.   On: 07/24/2014 12:39    Impression:  The patient has no evidence of persistent or recurrent disease at present.  The degree of post-radiation change surrounding the left lower lung nodule is quite pronounced, and will likely fibrose/contract on future scans.  Plan:  Chest CT in 6 months.  _____________________________________  Sheral Apley. Tammi Klippel, M.D.

## 2014-09-14 ENCOUNTER — Other Ambulatory Visit: Payer: Self-pay

## 2014-09-20 DIAGNOSIS — Z1211 Encounter for screening for malignant neoplasm of colon: Secondary | ICD-10-CM | POA: Diagnosis not present

## 2014-09-20 DIAGNOSIS — I10 Essential (primary) hypertension: Secondary | ICD-10-CM | POA: Diagnosis not present

## 2014-09-20 DIAGNOSIS — M7632 Iliotibial band syndrome, left leg: Secondary | ICD-10-CM | POA: Diagnosis not present

## 2014-09-20 DIAGNOSIS — Z23 Encounter for immunization: Secondary | ICD-10-CM | POA: Diagnosis not present

## 2014-09-20 DIAGNOSIS — E780 Pure hypercholesterolemia: Secondary | ICD-10-CM | POA: Diagnosis not present

## 2014-09-27 DIAGNOSIS — Z1211 Encounter for screening for malignant neoplasm of colon: Secondary | ICD-10-CM | POA: Diagnosis not present

## 2014-10-09 DIAGNOSIS — H25813 Combined forms of age-related cataract, bilateral: Secondary | ICD-10-CM | POA: Diagnosis not present

## 2014-10-09 DIAGNOSIS — H25819 Combined forms of age-related cataract, unspecified eye: Secondary | ICD-10-CM | POA: Diagnosis not present

## 2014-10-23 ENCOUNTER — Encounter: Payer: Self-pay | Admitting: *Deleted

## 2014-10-23 NOTE — [facility] Oncology Navigator Note (Signed)
Called patient to check in.  She reports that she is doing very well.  She denies any questions or concerns at this time.  I encouraged her to call me for any needs she may have.

## 2014-10-30 DIAGNOSIS — M7062 Trochanteric bursitis, left hip: Secondary | ICD-10-CM | POA: Diagnosis not present

## 2014-11-08 DIAGNOSIS — M7062 Trochanteric bursitis, left hip: Secondary | ICD-10-CM | POA: Diagnosis not present

## 2014-11-16 DIAGNOSIS — M7062 Trochanteric bursitis, left hip: Secondary | ICD-10-CM | POA: Diagnosis not present

## 2014-11-21 DIAGNOSIS — M7062 Trochanteric bursitis, left hip: Secondary | ICD-10-CM | POA: Diagnosis not present

## 2014-12-03 DIAGNOSIS — M7062 Trochanteric bursitis, left hip: Secondary | ICD-10-CM | POA: Diagnosis not present

## 2014-12-06 DIAGNOSIS — M7062 Trochanteric bursitis, left hip: Secondary | ICD-10-CM | POA: Diagnosis not present

## 2014-12-11 DIAGNOSIS — M7062 Trochanteric bursitis, left hip: Secondary | ICD-10-CM | POA: Diagnosis not present

## 2014-12-18 DIAGNOSIS — M7062 Trochanteric bursitis, left hip: Secondary | ICD-10-CM | POA: Diagnosis not present

## 2014-12-28 DIAGNOSIS — M7062 Trochanteric bursitis, left hip: Secondary | ICD-10-CM | POA: Diagnosis not present

## 2015-01-07 DIAGNOSIS — R0781 Pleurodynia: Secondary | ICD-10-CM | POA: Diagnosis not present

## 2015-01-22 ENCOUNTER — Ambulatory Visit (HOSPITAL_COMMUNITY)
Admission: RE | Admit: 2015-01-22 | Discharge: 2015-01-22 | Disposition: A | Discharge status: Home / Self Care | Payer: Medicare Other | Source: Ambulatory Visit | Attending: Radiation Oncology | Admitting: Radiation Oncology

## 2015-01-22 DIAGNOSIS — C3430 Malignant neoplasm of lower lobe, unspecified bronchus or lung: Secondary | ICD-10-CM

## 2015-01-22 DIAGNOSIS — Z08 Encounter for follow-up examination after completed treatment for malignant neoplasm: Secondary | ICD-10-CM | POA: Insufficient documentation

## 2015-01-22 DIAGNOSIS — C3412 Malignant neoplasm of upper lobe, left bronchus or lung: Secondary | ICD-10-CM | POA: Insufficient documentation

## 2015-01-22 DIAGNOSIS — C3410 Malignant neoplasm of upper lobe, unspecified bronchus or lung: Secondary | ICD-10-CM

## 2015-01-22 DIAGNOSIS — C3490 Malignant neoplasm of unspecified part of unspecified bronchus or lung: Secondary | ICD-10-CM | POA: Diagnosis not present

## 2015-01-22 DIAGNOSIS — J479 Bronchiectasis, uncomplicated: Secondary | ICD-10-CM | POA: Diagnosis not present

## 2015-01-22 DIAGNOSIS — C3431 Malignant neoplasm of lower lobe, right bronchus or lung: Secondary | ICD-10-CM | POA: Diagnosis not present

## 2015-01-22 DIAGNOSIS — J189 Pneumonia, unspecified organism: Secondary | ICD-10-CM | POA: Diagnosis not present

## 2015-01-22 DIAGNOSIS — Z923 Personal history of irradiation: Secondary | ICD-10-CM | POA: Insufficient documentation

## 2015-01-24 ENCOUNTER — Encounter: Payer: Self-pay | Admitting: Radiation Oncology

## 2015-01-24 ENCOUNTER — Ambulatory Visit
Admission: RE | Admit: 2015-01-24 | Discharge: 2015-01-24 | Disposition: A | Discharge status: Home / Self Care | Payer: Medicare Other | Source: Ambulatory Visit | Attending: Radiation Oncology | Admitting: Radiation Oncology

## 2015-01-24 ENCOUNTER — Encounter: Payer: Self-pay | Admitting: *Deleted

## 2015-01-24 VITALS — BP 138/63 | HR 95 | Resp 16 | Wt 136.0 lb

## 2015-01-24 DIAGNOSIS — C3412 Malignant neoplasm of upper lobe, left bronchus or lung: Secondary | ICD-10-CM

## 2015-01-24 MED ORDER — PENTOXIFYLLINE ER 400 MG PO TBCR: 400.0000 mg | EXTENDED_RELEASE_TABLET | Freq: Two times a day (BID) | ORAL | Status: DC

## 2015-01-24 NOTE — Progress Notes (Signed)
Here for six month follow up with Dr. Tammi Klippel and to review 2/23 CT of chest. Patient reports new onset inflammation under left axilla at old radiation site. Reports this same area is painful when she bends over. Reports her PCP told her this was inflamed scar tissue. Reports SOB. Hoarseness noted. Denies difficulty swallowing or cough. Weight and vitals stable.

## 2015-01-24 NOTE — Progress Notes (Signed)
Received a fax stating that patient's PCP needed to complete a referral (from [place] to [place]). I faxed the form to Dr. Alroy Dust with a note to complete and fax it back to Authorization for prior approval for a [facility] medical director.

## 2015-01-24 NOTE — Progress Notes (Signed)
Radiation Oncology         [phone] ________________________________  Name: [name] [name] MRN: [id number]  Date: 01/24/2015  DOB: [birth year]  Follow-Up Visit Note  CC: Donnie Coffin, MD  Rigoberto Noel, MD  Diagnosis:   74 yo woman with metachronous primary lung cancers:   18 mm squamous cell carcinoma of the left upper lobe of the lung - clinical stage IA  Completed SBRT - 03/01/2013, 03/03/2013, 03/08/2013 - to 54 Gy in 3 fractions  24.8 mm well differentiated adenocarcinoma of the left lower lobe of the lung - clinical stage IA  Completed SBRT2 - 01/22/2014, 01/24/2014, 01/29/2014 - to 54 gray in 3 fractions    ICD-9-CM ICD-10-CM   1. 18 mm squamous cell carcinoma of the left upper lobe of the lung - clinical stage IA 162.3 C34.12     Interval Since Last Radiation:  11  months  Narrative:  The patient returns today for routine follow-up.  Here for six month follow up to review 2/23 CT of chest. Patient reports new onset inflammation under left axilla at old radiation site. Reports this same area is painful when she bends over. Reports her PCP told her this was inflamed scar tissue. Reports SOB. Hoarseness noted. Denies difficulty swallowing or cough. Weight and vitals stable.                               ALLERGIES:  is allergic to penicillins.  Meds: Current Outpatient Prescriptions  Medication Sig Dispense Refill  . amLODipine-benazepril (LOTREL) 5-20 MG per capsule Take 1 capsule by mouth daily before breakfast.     . atorvastatin (LIPITOR) 40 MG tablet Take 20 mg by mouth every morning.    . etodolac (LODINE) 400 MG tablet     . hydrochlorothiazide (HYDRODIURIL) 25 MG tablet Take 25 mg by mouth daily before breakfast.     . acetaminophen (TYLENOL) 500 MG tablet Take 1,000 mg by mouth every 6 (six) hours as needed for pain.    . Coenzyme Q10 (CO Q 10) 100 MG CAPS Take 100 mg by mouth at bedtime.     Marland Kitchen  ibuprofen (ADVIL,MOTRIN) 200 MG tablet Take 400 mg by mouth every 6 (six) hours as needed for pain.     No current facility-administered medications for this encounter.    Physical Findings: The patient is in no acute distress. Patient is alert and oriented.  weight is 136 lb (61.689 kg). Her blood pressure is 138/63 and her pulse is 95. Her respiration is 16 and oxygen saturation is 95%. .  No significant changes.  Lab Findings: Lab Results  Component Value Date   WBC 6.1 01/04/2014   HGB 13.5 01/04/2014   HCT 38.8 01/04/2014   PLT 223 01/04/2014    Lab Results  Component Value Date   NA 141 11/18/2012   K 3.5 11/18/2012   CO2 25 11/18/2012   GLUCOSE 124* 11/18/2012   BUN 24* 11/18/2012   CREATININE 1.00 02/14/2013   BILITOT 0.7 11/17/2012   ALKPHOS 73 11/17/2012   AST 25 11/17/2012   ALT 23 11/17/2012   PROT 6.3 11/17/2012   ALBUMIN 3.1* 11/17/2012   CALCIUM 9.3 11/18/2012    Radiographic Findings: Ct Chest Wo Contrast  01/22/2015   CLINICAL DATA:  Lung cancer status post stereotactic radiotherapy. Adenocarcinoma of the left upper lobe in 2014 and left lower lobe 2015.  EXAM: CT CHEST WITHOUT CONTRAST  TECHNIQUE: Multidetector  CT imaging of the chest was performed following the standard protocol without IV contrast.  COMPARISON:  CT 07/24/2014, PET-CT 01/27/20015  FINDINGS: Mediastinum/Nodes: No axillary or supraclavicular lymphadenopathy. No mediastinal hilar lymphadenopathy. No pericardial fluid. Esophagus is normal.  Lungs/Pleura: Perihilar consolidation and central bronchiectasis in the left upper lobe and left lower lobe are similar to comparison exam. No new nodularity. Stable 5 mm left pulmonary nodule (image 28, series 5). There is mild peripheral reticulation in the right lung. There is paraseptal emphysema in the upper lobes.  Upper abdomen: Limited view of the liver, kidneys, pancreas are unremarkable. Normal adrenal glands.  Musculoskeletal: No aggressive osseous  lesion. Multiple levels of endplate narrowing and sclerosis.  IMPRESSION: 1. Stable left perihilar consolidation and bronchiectasis related to radiation therapy. 2. No evidence of disease progression. 3. Stable left lower lobe pulmonary nodule over multiple comparison exams. 4. Peripheral reticulation and mild architectural distortion related to interstitial lung disease.   Electronically Signed   By: Suzy Bouchard M.D.   On: 01/22/2015 12:20   I reviewed her CT with respect to her longstanding left medial breast pain, and new onset left axillary pain which has a positional component.      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   Impression:  The patient is recovering from the effects of radiation.  Her tumors appear to be controlled.  But, she may have some classic post-SBRT complications, of myositis and rib fracture.  Plan:  I will start Trenal and Vitamin E to treat post-radiation fibrosis/myositis, and also refer to Clydell Hakim of Pain Management to assess for possible left 5th rib nerve block.  She'll return in one month to assess.  _____________________________________  Sheral Apley Tammi Klippel, M.D.

## 2015-01-24 NOTE — [facility] Oncology Navigator Note (Signed)
Patient at [facility] for follow up visit with Dr. Tammi Klippel.  Patient reports that she is doing well.  She does report pain in her upper left chest.  She reports that the pain makes it difficult to breathe.  She has been seen by Dr. Alroy Dust who feels it is caused by scar tissue from radiation and has recently prescribed Lodine.  I instructed her to discuss with Dr. Tammi Klippel.  She denies any questions or other concerns at this time.  I gave her my contact information and encouraged her to call me for any needs

## 2015-01-29 ENCOUNTER — Telehealth: Payer: Self-pay | Admitting: *Deleted

## 2015-01-29 NOTE — Telephone Encounter (Signed)
CALLED PATIENT TO UPDATE REGARDING DR. HARKINS ' APPT., LVM FOR A RETURN CALL

## 2015-02-20 ENCOUNTER — Telehealth: Payer: Self-pay | Admitting: *Deleted

## 2015-02-20 NOTE — Telephone Encounter (Signed)
Called patient to inform that she needs to see Dr. Maryjean Ka, prior to the visit with Dr. Tammi Klippel, cancelled appt. And waiting for a return call from Dr. Donell Sievert Office with appt. Date and time for this patient before she will be rescheduled Dr. Tammi Klippel, patient verbalized understanding this.

## 2015-02-21 ENCOUNTER — Ambulatory Visit: Payer: BLUE CROSS/BLUE SHIELD | Admitting: Radiation Oncology

## 2015-02-28 ENCOUNTER — Telehealth: Payer: Self-pay | Admitting: *Deleted

## 2015-02-28 NOTE — Telephone Encounter (Signed)
Called this patient to get updated on the [facility] appt., she states that she is scheduled for 03-24-15 and that she will have an injection and that the appt. Will take 4 hrs, Ms. Bayless will call me back once this appt. Happens and I wil reschedule the fu visit with Dr. Tammi Klippel, notified Ruthann Cancer, nurse of Dr. Tammi Klippel

## 2015-03-21 ENCOUNTER — Encounter: Payer: Self-pay | Admitting: Radiation Oncology

## 2015-03-21 ENCOUNTER — Telehealth: Payer: Self-pay | Admitting: *Deleted

## 2015-03-21 ENCOUNTER — Encounter: Payer: Self-pay | Admitting: *Deleted

## 2015-03-21 ENCOUNTER — Ambulatory Visit
Admission: RE | Admit: 2015-03-21 | Discharge: 2015-03-21 | Disposition: A | Discharge status: Home / Self Care | Payer: Medicare Other | Source: Ambulatory Visit | Attending: Radiation Oncology | Admitting: Radiation Oncology

## 2015-03-21 VITALS — BP 193/100 | HR 87 | Resp 16 | Wt 136.9 lb

## 2015-03-21 DIAGNOSIS — C3412 Malignant neoplasm of upper lobe, left bronchus or lung: Secondary | ICD-10-CM | POA: Diagnosis not present

## 2015-03-21 DIAGNOSIS — C3432 Malignant neoplasm of lower lobe, left bronchus or lung: Secondary | ICD-10-CM

## 2015-03-21 NOTE — [facility] Oncology Navigator Note (Signed)
Patient at [facility] for follow up visit with Dr. Tammi Klippel.  Patient reports that the pain in her left side and beneath her arm has resolved.  She does report pain in her hip when she walks long distances that she describes as similar to sciatica.  She has seen an orthopedist who referred her to PT.  She is no longer seeing PT but is currently seeing a massage therapist to help with managing her pain.  She finds that she is unable to be as active as she would like to be due to the pain.  She denied any questions or concerns at this time.  I encouraged her to call me for any needs.

## 2015-03-21 NOTE — Progress Notes (Signed)
Patient returns today for pain assessment with Dr. Tammi Klippel. Reports inflammation under left axilla and left rib pain have resolved. Denies taking trental and vitamin e for post radiation fibrosis. Patient reports she did not see Dr. Maryjean Ka because her pain resolved. Reports SOB only with exertion. Hoarseness continues but, is no worse. Denies difficulty swallowing or cough. Weight stable. Blood pressure elevated. Patient denies taking BP medication this morning but, reports she plans to take bp medication as soon as she returns home. Denies headache, dizziness, nausea, vomiting, diplopia, ringing in the ears or diarrhea. Reports left hip and low back pain following exertion. Reports she stopped PT because she believes the therapist is the one who broke her rib. Reports she go to massage therapy now for pain relief.

## 2015-03-21 NOTE — Progress Notes (Signed)
Radiation Oncology         ([phone]) [phone] ________________________________  Name: [name] [name] MRN: [id number]  Date: 03/21/2015  DOB: [birth year]  Follow-Up Visit Note  CC: Donnie Coffin, MD  Rigoberto Noel, MD  Diagnosis:   74 yo woman with metachronous primary lung cancers:   18 mm squamous cell carcinoma of the left upper lobe of the lung - clinical stage IA  Completed SBRT - 03/01/2013, 03/03/2013, 03/08/2013 - to 54 Gy in 3 fractions  24.8 mm well differentiated adenocarcinoma of the left lower lobe of the lung - clinical stage IA  Completed SBRT2 - 01/22/2014, 01/24/2014, 01/29/2014 - to 54 gray in 3 fractions    ICD-9-CM ICD-10-CM   1. 18 mm squamous cell carcinoma of the left upper lobe of the lung - clinical stage IA 162.3 C34.12 CT Chest Wo Contrast  2. 24.8 mm well differentiated adenocarcinoma of the left lower lobe of the lung - clinical stage IA 162.5 C34.32 CT Chest Wo Contrast    Interval Since Last Radiation:  14  months  Narrative:  The patient returns today for routine follow-up.  Patient returns today for pain assessment with Dr. Tammi Klippel. Reports inflammation under left axilla and left rib pain have resolved. Denies taking trental and vitamin e for post radiation fibrosis. Patient reports she did not see Dr. Maryjean Ka because her pain resolved. Reports SOB only with exertion. Hoarseness continues but, is no worse. Denies difficulty swallowing or cough. Weight stable. Blood pressure elevated. Patient denies taking BP medication this morning but, reports she plans to take bp medication as soon as she returns home. Denies headache, dizziness, nausea, vomiting, diplopia, ringing in the ears or diarrhea. Reports left hip and low back pain following exertion. Reports she stopped PT because she believes the therapist is the one who broke her rib. Reports she go to massage therapy now for pain relief.                    ALLERGIES:  is allergic to penicillins.  Meds: Current Outpatient Prescriptions  Medication Sig Dispense Refill  . acetaminophen (TYLENOL) 500 MG tablet Take 1,000 mg by mouth every 6 (six) hours as needed for pain.    Marland Kitchen amLODipine-benazepril (LOTREL) 5-20 MG per capsule Take 1 capsule by mouth daily before breakfast.     . atorvastatin (LIPITOR) 40 MG tablet Take 20 mg by mouth every morning.    . Coenzyme Q10 (CO Q 10) 100 MG CAPS Take 100 mg by mouth at bedtime.     Marland Kitchen etodolac (LODINE) 400 MG tablet     . hydrochlorothiazide (HYDRODIURIL) 25 MG tablet Take 25 mg by mouth daily before breakfast.     . ibuprofen (ADVIL,MOTRIN) 200 MG tablet Take 400 mg by mouth every 6 (six) hours as needed for pain.    . pentoxifylline (TRENTAL) 400 MG CR tablet Take 1 tablet (400 mg total) by mouth 2 (two) times daily. (Patient not taking: Reported on 03/21/2015) 90 tablet 5   No current facility-administered medications for this encounter.    Physical Findings: The patient is in no acute distress. Patient is alert and oriented.  weight is 136 lb 14.4 oz (62.097 kg). Her blood pressure is 193/100 and her pulse is 87. Her respiration is 16 and oxygen saturation is 96%. .  No significant changes. No point tenderness in left rib cage.  Lab Findings: Lab Results  Component Value Date   WBC 6.1 01/04/2014   HGB 13.5  01/04/2014   HCT 38.8 01/04/2014   PLT 223 01/04/2014    Lab Results  Component Value Date   NA 141 11/18/2012   K 3.5 11/18/2012   CO2 25 11/18/2012   GLUCOSE 124* 11/18/2012   BUN 24* 11/18/2012   CREATININE 1.00 02/14/2013   BILITOT 0.7 11/17/2012   ALKPHOS 73 11/17/2012   AST 25 11/17/2012   ALT 23 11/17/2012   PROT 6.3 11/17/2012   ALBUMIN 3.1* 11/17/2012   CALCIUM 9.3 11/18/2012    Radiographic Findings: No results found.  Impression:  The patient is recovering from the effects of radiation.  Had some chest wall discomfort from radiation. Took  Trental and vitamin E and pain improved, without complaint.   Plan:  Repeat chest CT in 6 months.   This document serves as a record of services personally performed by Tyler Pita, MD. It was created on his behalf by Arlyce Harman, a trained medical scribe. The creation of this record is based on the scribe's personal observations and the provider's statements to them. This document has been checked and approved by the attending provider.     _____________________________________  Sheral Apley. Tammi Klippel, M.D.

## 2015-03-21 NOTE — Telephone Encounter (Signed)
CALLED PATIENT TO INFORM OF CT AND FU, SPOKE WITH PATIENT AND SHE IS AWARE OF THESE APPTS. 

## 2015-03-26 DIAGNOSIS — E780 Pure hypercholesterolemia: Secondary | ICD-10-CM | POA: Diagnosis not present

## 2015-03-26 DIAGNOSIS — I10 Essential (primary) hypertension: Secondary | ICD-10-CM | POA: Diagnosis not present

## 2015-03-26 DIAGNOSIS — M7632 Iliotibial band syndrome, left leg: Secondary | ICD-10-CM | POA: Diagnosis not present

## 2015-04-03 DIAGNOSIS — H40053 Ocular hypertension, bilateral: Secondary | ICD-10-CM | POA: Diagnosis not present

## 2015-04-03 DIAGNOSIS — H25813 Combined forms of age-related cataract, bilateral: Secondary | ICD-10-CM | POA: Diagnosis not present

## 2015-04-23 DIAGNOSIS — L820 Inflamed seborrheic keratosis: Secondary | ICD-10-CM | POA: Diagnosis not present

## 2015-04-23 DIAGNOSIS — L918 Other hypertrophic disorders of the skin: Secondary | ICD-10-CM | POA: Diagnosis not present

## 2015-04-23 DIAGNOSIS — L821 Other seborrheic keratosis: Secondary | ICD-10-CM | POA: Diagnosis not present

## 2015-04-23 DIAGNOSIS — L2089 Other atopic dermatitis: Secondary | ICD-10-CM | POA: Diagnosis not present

## 2015-05-27 ENCOUNTER — Other Ambulatory Visit: Payer: Self-pay

## 2015-09-02 DIAGNOSIS — Z1231 Encounter for screening mammogram for malignant neoplasm of breast: Secondary | ICD-10-CM | POA: Diagnosis not present

## 2015-09-02 DIAGNOSIS — Z803 Family history of malignant neoplasm of breast: Secondary | ICD-10-CM | POA: Diagnosis not present

## 2015-09-17 ENCOUNTER — Ambulatory Visit (HOSPITAL_COMMUNITY)
Admission: RE | Admit: 2015-09-17 | Discharge: 2015-09-17 | Disposition: A | Discharge status: Home / Self Care | Payer: Medicare Other | Source: Ambulatory Visit | Attending: Radiation Oncology | Admitting: Radiation Oncology

## 2015-09-17 DIAGNOSIS — I700 Atherosclerosis of aorta: Secondary | ICD-10-CM | POA: Diagnosis not present

## 2015-09-17 DIAGNOSIS — Z08 Encounter for follow-up examination after completed treatment for malignant neoplasm: Secondary | ICD-10-CM | POA: Diagnosis not present

## 2015-09-17 DIAGNOSIS — J439 Emphysema, unspecified: Secondary | ICD-10-CM | POA: Insufficient documentation

## 2015-09-17 DIAGNOSIS — C3412 Malignant neoplasm of upper lobe, left bronchus or lung: Secondary | ICD-10-CM | POA: Diagnosis not present

## 2015-09-17 DIAGNOSIS — C3432 Malignant neoplasm of lower lobe, left bronchus or lung: Secondary | ICD-10-CM

## 2015-09-17 DIAGNOSIS — I2510 Atherosclerotic heart disease of native coronary artery without angina pectoris: Secondary | ICD-10-CM | POA: Insufficient documentation

## 2015-09-17 DIAGNOSIS — R911 Solitary pulmonary nodule: Secondary | ICD-10-CM | POA: Insufficient documentation

## 2015-09-17 DIAGNOSIS — C3490 Malignant neoplasm of unspecified part of unspecified bronchus or lung: Secondary | ICD-10-CM | POA: Diagnosis not present

## 2015-09-18 ENCOUNTER — Telehealth: Payer: Self-pay | Admitting: Radiation Oncology

## 2015-09-18 ENCOUNTER — Telehealth: Payer: Self-pay | Admitting: *Deleted

## 2015-09-18 DIAGNOSIS — C3432 Malignant neoplasm of lower lobe, left bronchus or lung: Secondary | ICD-10-CM

## 2015-09-18 DIAGNOSIS — C3412 Malignant neoplasm of upper lobe, left bronchus or lung: Secondary | ICD-10-CM

## 2015-09-18 NOTE — Telephone Encounter (Signed)
Per Dr. Johny Shears order. Phoned patient with normal CT results. Patient verbalized understanding. Patient wishes to cancel follow up for 11/3. Patient reports that the chest wall pain she had during her last follow up related to effects of a broken rib have resolved. Patient understands that Romie Jumper will be in touch with appointments for a repeat CT scan and follow up in six months.

## 2015-09-18 NOTE — Telephone Encounter (Signed)
CALLED PATIENT TO INFORM OF CT ON 03-11-16 @ [facility] AND HER FU WITH DR. MANNING ON 03-12-16 @ 10 AM, LVM FOR A RETURN CALL

## 2015-09-19 ENCOUNTER — Ambulatory Visit: Payer: BLUE CROSS/BLUE SHIELD | Admitting: Radiation Oncology

## 2015-09-25 DIAGNOSIS — Z23 Encounter for immunization: Secondary | ICD-10-CM | POA: Diagnosis not present

## 2015-09-25 DIAGNOSIS — J309 Allergic rhinitis, unspecified: Secondary | ICD-10-CM | POA: Diagnosis not present

## 2015-09-25 DIAGNOSIS — Z1211 Encounter for screening for malignant neoplasm of colon: Secondary | ICD-10-CM | POA: Diagnosis not present

## 2015-09-25 DIAGNOSIS — M7632 Iliotibial band syndrome, left leg: Secondary | ICD-10-CM | POA: Diagnosis not present

## 2015-09-25 DIAGNOSIS — E7800 Pure hypercholesterolemia, unspecified: Secondary | ICD-10-CM | POA: Diagnosis not present

## 2015-09-25 DIAGNOSIS — I10 Essential (primary) hypertension: Secondary | ICD-10-CM | POA: Diagnosis not present

## 2015-09-25 DIAGNOSIS — M150 Primary generalized (osteo)arthritis: Secondary | ICD-10-CM | POA: Diagnosis not present

## 2015-10-01 DIAGNOSIS — Z1211 Encounter for screening for malignant neoplasm of colon: Secondary | ICD-10-CM | POA: Diagnosis not present

## 2015-10-02 DIAGNOSIS — H52223 Regular astigmatism, bilateral: Secondary | ICD-10-CM | POA: Diagnosis not present

## 2015-10-02 DIAGNOSIS — H524 Presbyopia: Secondary | ICD-10-CM | POA: Diagnosis not present

## 2015-10-02 DIAGNOSIS — D3101 Benign neoplasm of right conjunctiva: Secondary | ICD-10-CM | POA: Diagnosis not present

## 2015-10-02 DIAGNOSIS — H01115 Allergic dermatitis of left lower eyelid: Secondary | ICD-10-CM | POA: Diagnosis not present

## 2015-10-02 DIAGNOSIS — H5203 Hypermetropia, bilateral: Secondary | ICD-10-CM | POA: Diagnosis not present

## 2015-10-02 DIAGNOSIS — H25813 Combined forms of age-related cataract, bilateral: Secondary | ICD-10-CM | POA: Diagnosis not present

## 2015-10-03 ENCOUNTER — Ambulatory Visit: Payer: BLUE CROSS/BLUE SHIELD | Admitting: Radiation Oncology

## 2015-11-11 DIAGNOSIS — M4726 Other spondylosis with radiculopathy, lumbar region: Secondary | ICD-10-CM | POA: Diagnosis not present

## 2015-11-11 DIAGNOSIS — M5136 Other intervertebral disc degeneration, lumbar region: Secondary | ICD-10-CM | POA: Diagnosis not present

## 2015-11-11 DIAGNOSIS — Z96641 Presence of right artificial hip joint: Secondary | ICD-10-CM | POA: Diagnosis not present

## 2015-11-11 DIAGNOSIS — I700 Atherosclerosis of aorta: Secondary | ICD-10-CM | POA: Diagnosis not present

## 2015-11-11 DIAGNOSIS — M545 Low back pain: Secondary | ICD-10-CM | POA: Diagnosis not present

## 2015-11-14 DIAGNOSIS — M4726 Other spondylosis with radiculopathy, lumbar region: Secondary | ICD-10-CM | POA: Diagnosis not present

## 2015-11-14 DIAGNOSIS — M4806 Spinal stenosis, lumbar region: Secondary | ICD-10-CM | POA: Diagnosis not present

## 2015-11-21 ENCOUNTER — Telehealth: Payer: Self-pay | Admitting: *Deleted

## 2015-11-21 NOTE — Telephone Encounter (Signed)
  Oncology Nurse Navigator Documentation    Navigator Encounter Type: Telephone (11/21/15 1500)     Barriers/Navigation Needs: No barriers at this time (11/21/15 1500)   Interventions: Coordination of Care (11/21/15 1500)  I called patient to check in.  Patient reports that she is doing well.  She was recently evaluated at [facility] for a disc problem and low back pain.  She is eager to be able to increase her activity.  She asked if aquatic therapy is available in [place].  I told her I would discuss with a physical therapist and call her back with information.  She denied any other needs at this time.  I encouraged her to call me for any needs.         Time Spent with Patient: 15 (11/21/15 1500)

## 2015-11-22 ENCOUNTER — Telehealth: Payer: Self-pay | Admitting: *Deleted

## 2015-11-22 NOTE — Telephone Encounter (Signed)
  Oncology Nurse Navigator Documentation   I called patient back to give her information about [facility] who does aquatic Physical Therapy.  Patient denied any other needs at this time.  I encouraged her to call for any assistance.                        Time Spent with Patient: 15 (11/22/15 1100)

## 2015-12-11 IMAGING — CT CT BIOPSY
1 of 3 series · 21 of 32 positions shown, 27 images · non-contrast
Comparison: none

CLINICAL DATA: History of left upper lung cancer. The patient now
has a suspicious lesion in the left lower lobe. Tissue diagnosis is
needed.

[Series 8: (hospital) 6.0 b30f · axial · 0.54mm/px · z∈[+1690,+1700]mm · 21 of 128 slices shown, 27 images]
[im 5/128  soft-tissue]
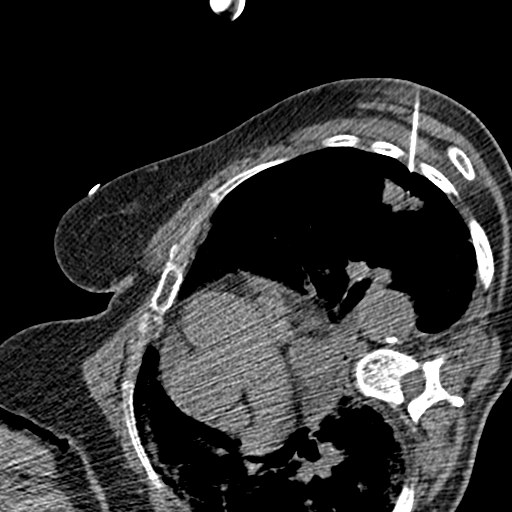
[im 5/128  bone]
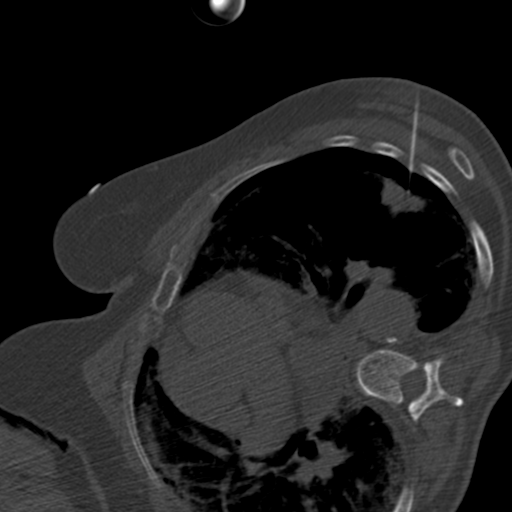
[im 14/128  soft-tissue]
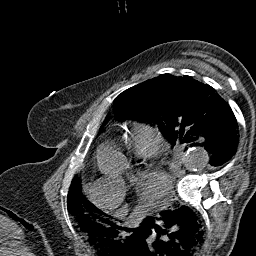
[im 18/128  soft-tissue]
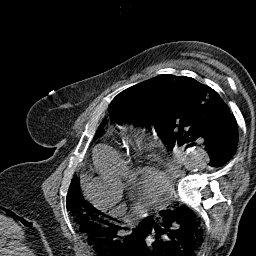
[im 22/128  soft-tissue]
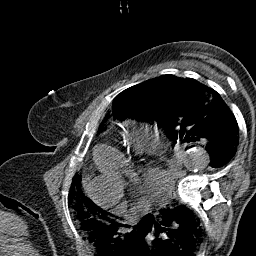
[im 31/128  soft-tissue]
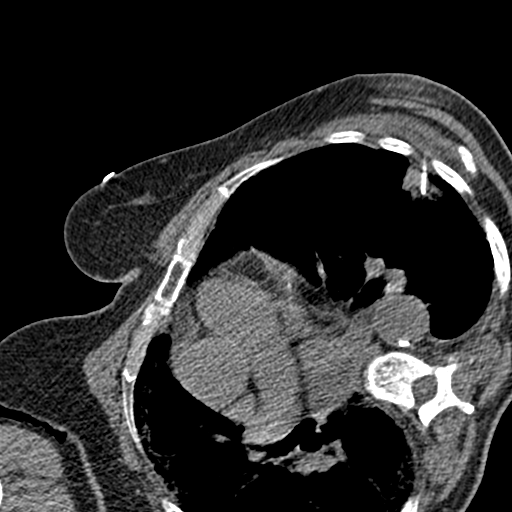
[im 36/128  soft-tissue]
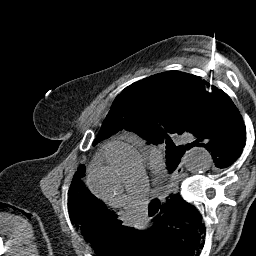
[im 44/128  soft-tissue]
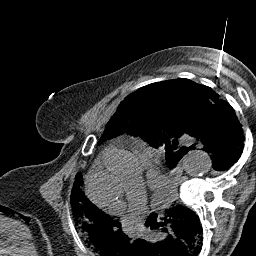
[im 49/128  soft-tissue]
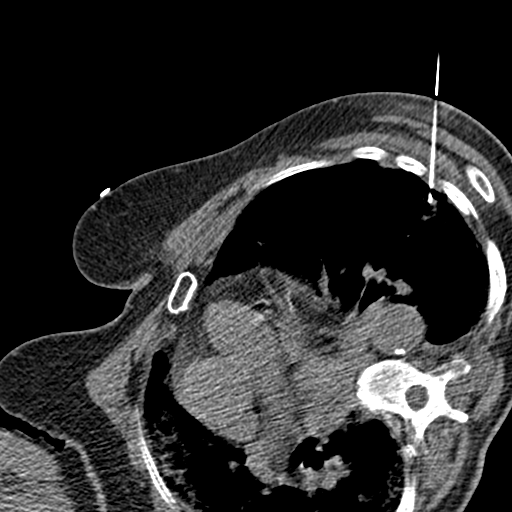
[im 53/128  soft-tissue]
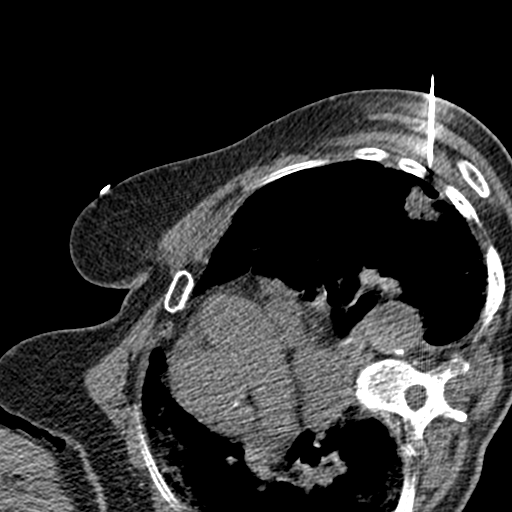
[im 53/128  bone]
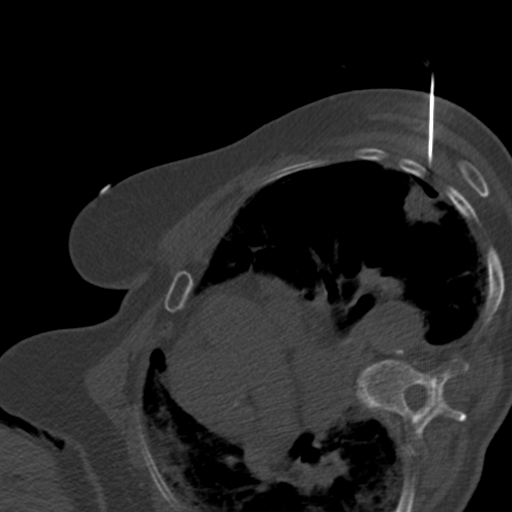
[im 62/128  soft-tissue]
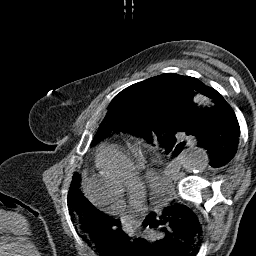
[im 66/128  soft-tissue]
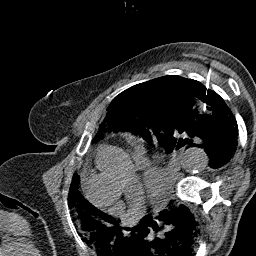
[im 75/128  soft-tissue]
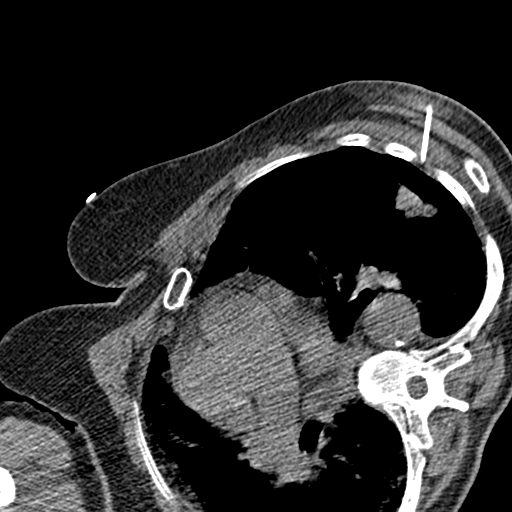
[im 79/128  soft-tissue]
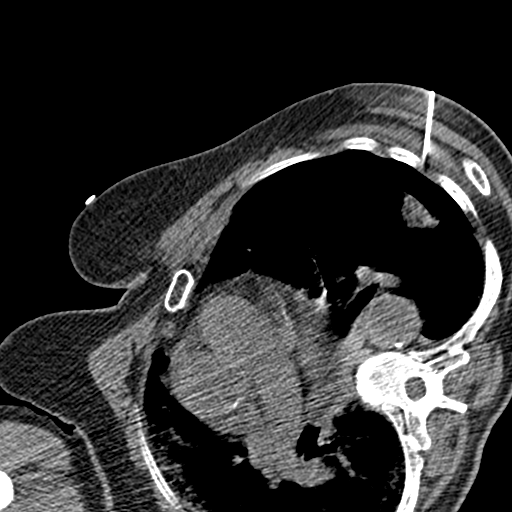
[im 84/128  soft-tissue]
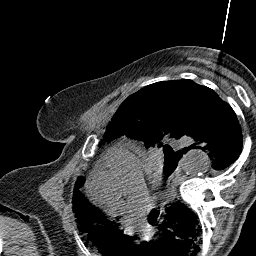
[im 92/128  soft-tissue]
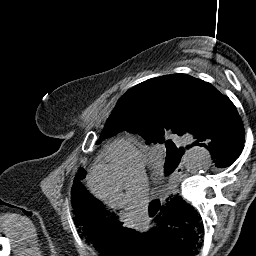
[im 97/128  soft-tissue]
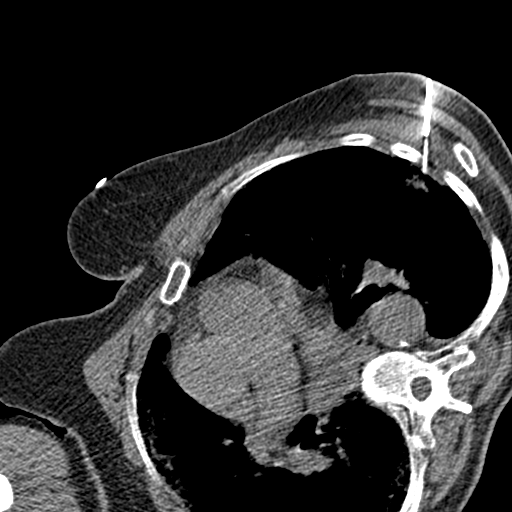
[im 106/128  soft-tissue]
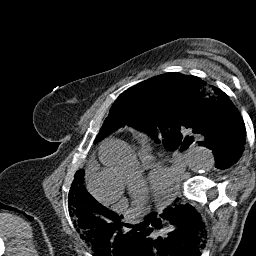
[im 106/128  bone]
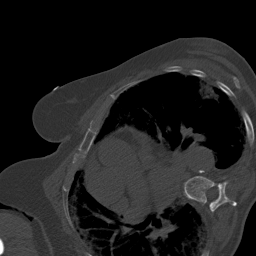
[im 110/128  soft-tissue]
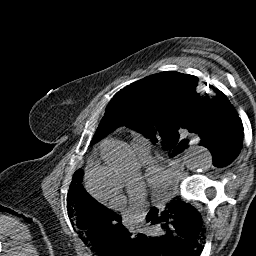
[im 110/128  lung]
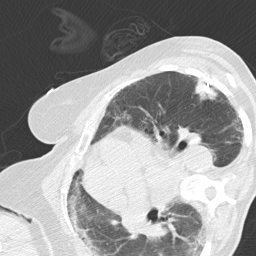
[im 114/128  soft-tissue]
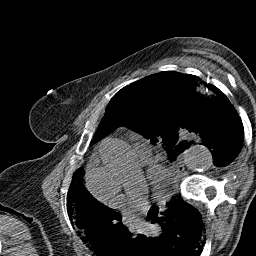
[im 114/128  lung]
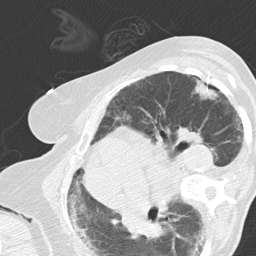
[im 119/128  lung]
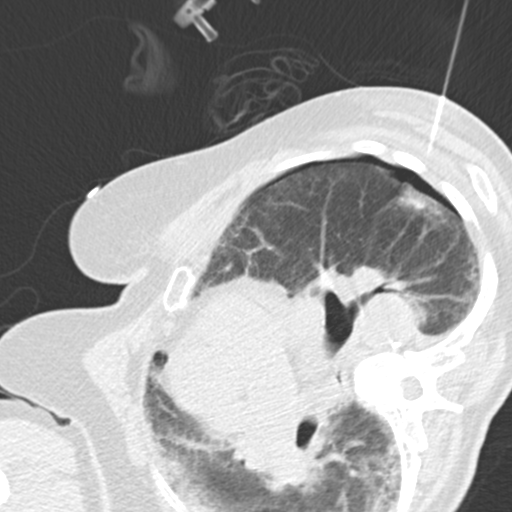
[im 123/128  soft-tissue]
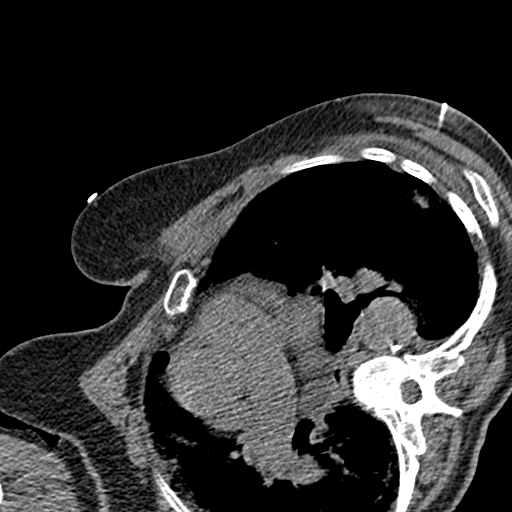
[im 123/128  lung]
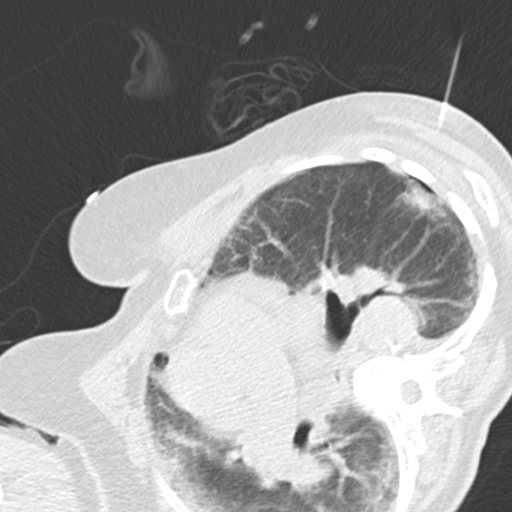

[21 of 32 positions shown; findings below may reference images not displayed]

EXAM:
CT GUIDED BIOPSY OF LEFT LOWER LOBE LUNG LESION

MEDICATIONS:
4 mg versed, 200 mcg fentanyl. A radiology nurse monitored the
patient for moderate sedation.

ANESTHESIA/SEDATION:
Sedation time: 50 min

PROCEDURE:
The procedure was explained to the patient. The risks and benefits
of the procedure were discussed and the patient's questions were
addressed. Informed consent was obtained from the patient. The
patient was placed on the CT scanner with the left side elevated. CT
images through the chest were obtained. The lesion in the anterior
left lower lobe was identified. The left side of the chest was
prepped with Betadine and a sterile drape was placed. The skin was
anesthetized with 1% lidocaine. Using CT guidance, a 17 gauge needle
was directed into the peripheral lesion. Needle position was
confirmed within the lesion. However, it was difficult to position
the needle in a location suitable for core biopsy. During needle
manipulation, the patient developed a small pneumothorax. As result,
an extension tube with a stopcock was attached to the needle and the
air was aspirated. Once the air was removed, needle was positioned
within the lesion and 2 fine needle aspirations were obtained 22
gauge needles. Dr. Tiger of pathology requested additional tissue.
The 17 gauge needle was advanced into the lesion again and a single
core biopsy was obtained with an 18 gauge core device. A dressing
was placed over the biopsy site.

COMPLICATIONS:
None
FINDINGS: Ill-defined peripheral opacity in anterior left lower lobe roughly
measuring up to 2.5 cm. Needle position was confirmed within the
lesion. The patient developed a small pneumothorax during the
procedure. The pleural air was successfully aspirated during the
procedure. At the end of the procedure, there was no significant
pleural air present.
IMPRESSION: Successful CT-guided fine needle aspirations and core biopsy of the
left lower lobe lesion.

## 2015-12-31 ENCOUNTER — Other Ambulatory Visit: Payer: Self-pay | Admitting: Family Medicine

## 2015-12-31 DIAGNOSIS — N289 Disorder of kidney and ureter, unspecified: Secondary | ICD-10-CM

## 2015-12-31 DIAGNOSIS — K769 Liver disease, unspecified: Secondary | ICD-10-CM

## 2016-01-01 DIAGNOSIS — M545 Low back pain: Secondary | ICD-10-CM | POA: Diagnosis not present

## 2016-01-07 ENCOUNTER — Ambulatory Visit
Admission: RE | Admit: 2016-01-07 | Discharge: 2016-01-07 | Disposition: A | Discharge status: Home / Self Care | Payer: Medicare Other | Source: Ambulatory Visit | Attending: Family Medicine | Admitting: Family Medicine

## 2016-01-07 DIAGNOSIS — N200 Calculus of kidney: Secondary | ICD-10-CM | POA: Diagnosis not present

## 2016-01-07 DIAGNOSIS — K769 Liver disease, unspecified: Secondary | ICD-10-CM

## 2016-01-07 DIAGNOSIS — N289 Disorder of kidney and ureter, unspecified: Secondary | ICD-10-CM

## 2016-01-07 DIAGNOSIS — N281 Cyst of kidney, acquired: Secondary | ICD-10-CM | POA: Diagnosis not present

## 2016-01-14 DIAGNOSIS — M545 Low back pain: Secondary | ICD-10-CM | POA: Diagnosis not present

## 2016-01-23 DIAGNOSIS — M545 Low back pain: Secondary | ICD-10-CM | POA: Diagnosis not present

## 2016-01-30 DIAGNOSIS — M545 Low back pain: Secondary | ICD-10-CM | POA: Diagnosis not present

## 2016-02-20 DIAGNOSIS — M545 Low back pain: Secondary | ICD-10-CM | POA: Diagnosis not present

## 2016-02-26 ENCOUNTER — Telehealth: Payer: Self-pay | Admitting: *Deleted

## 2016-02-26 NOTE — Telephone Encounter (Signed)
  Oncology Nurse Navigator Documentation  Navigator Location: [facility] (02/26/16 1404) Navigator Encounter Type: Telephone (02/26/16 1404)  I called patient to check in.  Patient reports that she is doing well.  She denies any questions or concerns.  We discussed her next CT scan and follow up appointment with Dr. Tammi Klippel.  I encouraged her to call me with any needs.           Treatment Phase: Follow-up (02/26/16 1404) Barriers/Navigation Needs: No barriers at this time (02/26/16 1404)   Interventions: None required (02/26/16 1404)            Acuity: Level 1 (02/26/16 1404) Acuity Level 1: Minimal follow up required (02/26/16 1404)

## 2016-03-03 DIAGNOSIS — M545 Low back pain: Secondary | ICD-10-CM | POA: Diagnosis not present

## 2016-03-11 ENCOUNTER — Ambulatory Visit (HOSPITAL_COMMUNITY)
Admission: RE | Admit: 2016-03-11 | Discharge: 2016-03-11 | Disposition: A | Discharge status: Home / Self Care | Payer: Medicare Other | Source: Ambulatory Visit | Attending: Radiation Oncology | Admitting: Radiation Oncology

## 2016-03-11 DIAGNOSIS — C3412 Malignant neoplasm of upper lobe, left bronchus or lung: Secondary | ICD-10-CM | POA: Insufficient documentation

## 2016-03-11 DIAGNOSIS — J8410 Pulmonary fibrosis, unspecified: Secondary | ICD-10-CM | POA: Insufficient documentation

## 2016-03-11 DIAGNOSIS — Z923 Personal history of irradiation: Secondary | ICD-10-CM | POA: Insufficient documentation

## 2016-03-11 DIAGNOSIS — I2510 Atherosclerotic heart disease of native coronary artery without angina pectoris: Secondary | ICD-10-CM | POA: Diagnosis not present

## 2016-03-11 DIAGNOSIS — C3432 Malignant neoplasm of lower lobe, left bronchus or lung: Secondary | ICD-10-CM | POA: Insufficient documentation

## 2016-03-11 DIAGNOSIS — C3492 Malignant neoplasm of unspecified part of left bronchus or lung: Secondary | ICD-10-CM | POA: Diagnosis not present

## 2016-03-12 ENCOUNTER — Encounter: Payer: Self-pay | Admitting: *Deleted

## 2016-03-12 ENCOUNTER — Ambulatory Visit
Admission: RE | Admit: 2016-03-12 | Discharge: 2016-03-12 | Disposition: A | Discharge status: Home / Self Care | Payer: Medicare Other | Source: Ambulatory Visit | Attending: Radiation Oncology | Admitting: Radiation Oncology

## 2016-03-12 ENCOUNTER — Encounter: Payer: Self-pay | Admitting: Radiation Oncology

## 2016-03-12 VITALS — BP 146/75 | HR 65 | Temp 97.6°F | Ht 62.5 in | Wt 132.6 lb

## 2016-03-12 DIAGNOSIS — C3412 Malignant neoplasm of upper lobe, left bronchus or lung: Secondary | ICD-10-CM | POA: Diagnosis not present

## 2016-03-12 DIAGNOSIS — C3432 Malignant neoplasm of lower lobe, left bronchus or lung: Secondary | ICD-10-CM

## 2016-03-12 NOTE — Progress Notes (Signed)
[name] [name] has no voiced concerns today.

## 2016-03-12 NOTE — Progress Notes (Addendum)
Radiation Oncology         [phone] ________________________________  Name: [name] [name] MRN: [id number]  Date: 03/12/2016  DOB: [birth year]  Follow-Up Visit Note  CC: Donnie Coffin, MD  Rigoberto Noel, MD  Diagnosis: 75 y.o. female 2 separate stage I A adenocarcinoma of the left lung       ICD-9-CM ICD-10-CM   1. 18 mm squamous cell carcinoma of the left upper lobe of the lung - clinical stage IA 162.3 C34.12   2. 24.8 mm well differentiated adenocarcinoma of the left lower lobe of the lung - clinical stage IA 162.5 C34.32     Interval Since Last Radiation:  14  months   24.8 mm well differentiated adenocarcinoma of the left lower lobe of the lung - clinical stageIA                        Completed SBRT2 - 01/22/2014, 01/24/2014, 01/29/2014 - to 54 gray in 3 fractions  18 mm squamous cell carcinoma of the left upper lobe of the lung - clinical stage IA  Completed SBRT - 03/01/2013, 03/03/2013, 03/08/2013 - to 54 Gy in 3 fractions   Narrative:  The patient returns today for routine follow-up. Her CT scan from 03/11/2016 is reviewed. A 6 mm nodule is again noted, and platelike soft tissue thickening in the left perihilar region is unchanged and presumably treatment related. Mild architectural distortion was also noted to be more prominent since 2014 and presumably treatment related.  On review of systems, the patient reports that she is doing very well. She denies any concerns with shortness of breath or chest pain. She is not experiencing any cough or phlegm production. She states that she has not expensing fevers or unintended weight changes, she denies any abdominal pain, nausea, vomiting. A complete review of systems is obtained and is otherwise negative.   Past Medical History: Past Medical History  Diagnosis Date  . Hypertension   . Arthritis     a. 10/2012 s/p Right THA.  Marland Kitchen Hyperlipidemia   . Pulmonary nodule     a. 10/2012  CT: 46m anterior LUL nodule w/ spiculated appearance - rec PET CT.  .Marland KitchenAllergy   . History of ARDS   . Anxiety   [place]    LUL lung SQUAMOUS CELL  . [place]     LLL ADENOCARCINOMA   Surgical History:  Past Surgical History  Procedure Laterality Date  . Tonsillectomy    . Total hip arthroplasty  11/03/2012    Procedure: TOTAL HIP ARTHROPLASTY ANTERIOR APPROACH;  Surgeon: CMcarthur Rossetti MD;  Location: [facility];  Service: Orthopedics;  Laterality: Right;  Right Total Hip Arthroplasty, Anterior Approach (C-Arm)  . Tubal ligation    . Lung biopsy Left 02/16/13    LUL =non small cell ca with assoc/necrosis   Social History:  Social History  Substance Use Topics  . Smoking status: Former Smoker -- 1.00 packs/day for 40 years    Types: Cigarettes    Quit date: 11/03/2012  . Smokeless tobacco: None  . Alcohol Use: Yes     Comment: 2-3 drinks of liquor nightly    Family History: Family History  Problem Relation Age of Onset  . CAD Father    Health Care Maintenance: The patient reports that she is up-to-date with mammography, colonoscopy, and keep [place]formed she undergoes these procedures again.  ALLERGIES:  is allergic to  penicillins.  Meds: Current Outpatient Prescriptions  Medication Sig Dispense Refill  . acetaminophen (TYLENOL) 500 MG tablet Take 1,000 mg by mouth every 6 (six) hours as needed for pain.    Marland Kitchen amLODipine-benazepril (LOTREL) 5-20 MG per capsule Take 1 capsule by mouth daily before breakfast.     . atorvastatin (LIPITOR) 40 MG tablet Take 20 mg by mouth every morning.    . etodolac (LODINE) 400 MG tablet     . hydrochlorothiazide (HYDRODIURIL) 25 MG tablet Take 25 mg by mouth daily before breakfast.     . ibuprofen (ADVIL,MOTRIN) 200 MG tablet Take 400 mg by mouth every 6 (six) hours as needed for pain.     No current facility-administered medications for this encounter.    Physical Findings:  height is 5'  2.5" (1.588 m) and weight is 132 lb 9.6 oz (60.147 kg). Her temperature is 97.6 F (36.4 C). Her blood pressure is 146/75 and her pulse is 65. Her oxygen saturation is 97%.   Pain scale 0/10 In general this is a well appearing Caucasian female in no acute distress. She is alert and oriented x4 and appropriate throughout the examination. HEENT reveals that the patient is normocephalic, atraumatic. EOMs are intact. PERRLA. Skin is intact without any evidence of gross lesions. Cardiovascular exam reveals a regular rate and rhythm, no clicks rubs or murmurs are auscultated. Chest is clear to auscultation bilaterally. Lymphatic assessment is performed and does not reveal any adenopathy in the cervical, supraclavicular, axillary, or inguinal chains. Abdomen has active bowel sounds in all quadrants and is intact. The abdomen is soft, non tender, non distended. Lower extremities are negative for pretibial pitting edema, deep calf tenderness, cyanosis or clubbing.   Lab Findings: Lab Results  Component Value Date   WBC 6.1 01/04/2014   HGB 13.5 01/04/2014   HCT 38.8 01/04/2014   PLT 223 01/04/2014    Lab Results  Component Value Date   NA 141 11/18/2012   K 3.5 11/18/2012   CO2 25 11/18/2012   GLUCOSE 124* 11/18/2012   BUN 24* 11/18/2012   CREATININE 1.00 02/14/2013   BILITOT 0.7 11/17/2012   ALKPHOS 73 11/17/2012   AST 25 11/17/2012   ALT 23 11/17/2012   PROT 6.3 11/17/2012   ALBUMIN 3.1* 11/17/2012   CALCIUM 9.3 11/18/2012    Radiographic Findings: Ct Chest Wo Contrast  03/11/2016  CLINICAL DATA:  Restaging left lung cancer. Chemotherapy and radiation therapy complete. EXAM: CT CHEST WITHOUT CONTRAST TECHNIQUE: Multidetector CT imaging of the chest was performed following the standard protocol without IV contrast. COMPARISON:  09/17/2015, 06/13/2013 and 11/06/2012. FINDINGS: Mediastinum/Nodes: Mediastinal lymph nodes are not enlarged by CT size criteria. Hilar regions are difficult to  definitively evaluate without IV contrast. No axillary adenopathy. Atherosclerotic calcification of the arterial vasculature, including coronary arteries. Pulmonary arteries are borderline enlarged. Heart size normal. No pericardial effusion. Lungs/Pleura: Biapical pleural parenchymal scarring and mild paraseptal emphysema. Posttreatment consolidation in the medial aspect of the left upper lobe may have increased slightly in the interval (series 6, images 34-38). Associated bronchiectasis. Platelike soft tissue thickening in the left perihilar region is unchanged and presumably treatment related. Subpleural reticulation, ground-glass, traction bronchiectasis/ bronchiolectasis and mild architectural distortion appear progressive from 06/13/2013. Left lower lobe nodule measures 6 mm (6/75), stable from prior exams. No pleural fluid. Airway is otherwise unremarkable. Upper abdomen: Scattered sub cm low-attenuation lesions in the liver are too small to characterize. Visualized portions of the liver and right adrenal gland are  otherwise unremarkable. There may be slight thickening of the left adrenal gland, unchanged. Visualized portion of the right kidney is unremarkable. 12 mm high attenuation lesion off the posterior interpolar left kidney is incompletely imaged. Additional low-attenuation lesions in the left kidney measure up to 12 mm, incompletely imaged. Visualized portions of the spleen, pancreas and stomach are grossly unremarkable. Musculoskeletal: No worrisome lytic or sclerotic lesions. Degenerative changes are seen throughout the spine. Old left rib fracture. IMPRESSION: 1. Post treatment changes in the left hemi thorax with slight increase in consolidation in the medial left upper lobe. Findings may be due to evolutionary changes of radiation therapy if time course is appropriate. Disease recurrence cannot be excluded. 2. Three-vessel coronary artery calcification. 3. Subpleural pulmonary fibrosis, possibly  progressive from 06/13/2013, raising suspicion for usual interstitial pneumonitis. Fibrotic nonspecific interstitial pneumonitis is not excluded. Electronically Signed   By: Lorin Picket M.D.   On: 03/11/2016 11:05    Impression/Plan: 1.  Clinical stage IA non-small cell carcinoma of the left lung. The patient is status post S/P RT to 2 separate lesions, the left upper lobe being treated in April 2014, and a left lower lobe being treated in March 2015. She has since been clinically and radiographically without evidence of recurrence, and her CT is reviewed today. We will continue to follow the patient expectantly, and would recommend that she keep [place] informed of any changes in her status prior to her next visit. Discussed that we would recommend her next scan be performed in 6 months time. She states agreement and understanding and will return for follow-up after that has been performed. 2. Chronic left rib fracture. The patient is interested in meeting with a chiropractor for management of this, and given her the name of a local chiropractor who has taken care of patient that I have followed previously, and she will keep me informed of her progress with this as well.      Carola Rhine, PAC

## 2016-03-13 NOTE — Progress Notes (Signed)
  Oncology Nurse Navigator Documentation  Navigator Location: [facility] (03/12/16 0945) Navigator Encounter Type: Follow-up Appt (03/12/16 0945)  Patient at [facility] for routine follow-up appointment with RadOnc NP.  Patient reports that she is doing very well.  She reports that she feels well.  She denies any questions or concerns at this time.  I encouraged her to call me for any needs.             Barriers/Navigation Needs: No barriers at this time (03/12/16 0945)   Interventions: None required (03/12/16 0945)                      Time Spent with Patient: 15 (03/12/16 0945)

## 2016-03-16 DIAGNOSIS — M9903 Segmental and somatic dysfunction of lumbar region: Secondary | ICD-10-CM | POA: Diagnosis not present

## 2016-03-16 DIAGNOSIS — M5432 Sciatica, left side: Secondary | ICD-10-CM | POA: Diagnosis not present

## 2016-03-17 DIAGNOSIS — M5432 Sciatica, left side: Secondary | ICD-10-CM | POA: Diagnosis not present

## 2016-03-17 DIAGNOSIS — M9903 Segmental and somatic dysfunction of lumbar region: Secondary | ICD-10-CM | POA: Diagnosis not present

## 2016-03-19 DIAGNOSIS — M9903 Segmental and somatic dysfunction of lumbar region: Secondary | ICD-10-CM | POA: Diagnosis not present

## 2016-03-19 DIAGNOSIS — M5432 Sciatica, left side: Secondary | ICD-10-CM | POA: Diagnosis not present

## 2016-03-23 DIAGNOSIS — M5432 Sciatica, left side: Secondary | ICD-10-CM | POA: Diagnosis not present

## 2016-03-23 DIAGNOSIS — M9903 Segmental and somatic dysfunction of lumbar region: Secondary | ICD-10-CM | POA: Diagnosis not present

## 2016-03-25 DIAGNOSIS — M9903 Segmental and somatic dysfunction of lumbar region: Secondary | ICD-10-CM | POA: Diagnosis not present

## 2016-03-25 DIAGNOSIS — M5432 Sciatica, left side: Secondary | ICD-10-CM | POA: Diagnosis not present

## 2016-03-26 DIAGNOSIS — M9903 Segmental and somatic dysfunction of lumbar region: Secondary | ICD-10-CM | POA: Diagnosis not present

## 2016-03-26 DIAGNOSIS — M5432 Sciatica, left side: Secondary | ICD-10-CM | POA: Diagnosis not present

## 2016-03-30 DIAGNOSIS — M545 Low back pain: Secondary | ICD-10-CM | POA: Diagnosis not present

## 2016-03-30 DIAGNOSIS — M9903 Segmental and somatic dysfunction of lumbar region: Secondary | ICD-10-CM | POA: Diagnosis not present

## 2016-03-30 DIAGNOSIS — E7800 Pure hypercholesterolemia, unspecified: Secondary | ICD-10-CM | POA: Diagnosis not present

## 2016-03-30 DIAGNOSIS — M5432 Sciatica, left side: Secondary | ICD-10-CM | POA: Diagnosis not present

## 2016-03-30 DIAGNOSIS — I10 Essential (primary) hypertension: Secondary | ICD-10-CM | POA: Diagnosis not present

## 2016-04-01 DIAGNOSIS — M9903 Segmental and somatic dysfunction of lumbar region: Secondary | ICD-10-CM | POA: Diagnosis not present

## 2016-04-01 DIAGNOSIS — M5432 Sciatica, left side: Secondary | ICD-10-CM | POA: Diagnosis not present

## 2016-04-06 DIAGNOSIS — M9903 Segmental and somatic dysfunction of lumbar region: Secondary | ICD-10-CM | POA: Diagnosis not present

## 2016-04-06 DIAGNOSIS — M5432 Sciatica, left side: Secondary | ICD-10-CM | POA: Diagnosis not present

## 2016-04-09 DIAGNOSIS — M9903 Segmental and somatic dysfunction of lumbar region: Secondary | ICD-10-CM | POA: Diagnosis not present

## 2016-04-09 DIAGNOSIS — M5432 Sciatica, left side: Secondary | ICD-10-CM | POA: Diagnosis not present

## 2016-04-13 DIAGNOSIS — M5432 Sciatica, left side: Secondary | ICD-10-CM | POA: Diagnosis not present

## 2016-04-13 DIAGNOSIS — M9903 Segmental and somatic dysfunction of lumbar region: Secondary | ICD-10-CM | POA: Diagnosis not present

## 2016-04-16 DIAGNOSIS — M9903 Segmental and somatic dysfunction of lumbar region: Secondary | ICD-10-CM | POA: Diagnosis not present

## 2016-04-16 DIAGNOSIS — M5432 Sciatica, left side: Secondary | ICD-10-CM | POA: Diagnosis not present

## 2016-04-22 DIAGNOSIS — M4807 Spinal stenosis, lumbosacral region: Secondary | ICD-10-CM | POA: Diagnosis not present

## 2016-04-22 DIAGNOSIS — M5416 Radiculopathy, lumbar region: Secondary | ICD-10-CM | POA: Diagnosis not present

## 2016-04-23 DIAGNOSIS — M9903 Segmental and somatic dysfunction of lumbar region: Secondary | ICD-10-CM | POA: Diagnosis not present

## 2016-04-23 DIAGNOSIS — M5432 Sciatica, left side: Secondary | ICD-10-CM | POA: Diagnosis not present

## 2016-05-12 DIAGNOSIS — M4807 Spinal stenosis, lumbosacral region: Secondary | ICD-10-CM | POA: Diagnosis not present

## 2016-05-26 DIAGNOSIS — M5416 Radiculopathy, lumbar region: Secondary | ICD-10-CM | POA: Diagnosis not present

## 2016-05-26 DIAGNOSIS — M4807 Spinal stenosis, lumbosacral region: Secondary | ICD-10-CM | POA: Diagnosis not present

## 2016-06-17 DIAGNOSIS — M47817 Spondylosis without myelopathy or radiculopathy, lumbosacral region: Secondary | ICD-10-CM | POA: Diagnosis not present

## 2016-07-29 DIAGNOSIS — M4807 Spinal stenosis, lumbosacral region: Secondary | ICD-10-CM | POA: Diagnosis not present

## 2016-08-13 DIAGNOSIS — H52223 Regular astigmatism, bilateral: Secondary | ICD-10-CM | POA: Diagnosis not present

## 2016-08-13 DIAGNOSIS — H5203 Hypermetropia, bilateral: Secondary | ICD-10-CM | POA: Diagnosis not present

## 2016-08-13 DIAGNOSIS — H524 Presbyopia: Secondary | ICD-10-CM | POA: Diagnosis not present

## 2016-08-13 DIAGNOSIS — H2513 Age-related nuclear cataract, bilateral: Secondary | ICD-10-CM | POA: Diagnosis not present

## 2016-08-13 DIAGNOSIS — H25813 Combined forms of age-related cataract, bilateral: Secondary | ICD-10-CM | POA: Diagnosis not present

## 2016-08-18 DIAGNOSIS — M5416 Radiculopathy, lumbar region: Secondary | ICD-10-CM | POA: Diagnosis not present

## 2016-08-18 DIAGNOSIS — M4807 Spinal stenosis, lumbosacral region: Secondary | ICD-10-CM | POA: Diagnosis not present

## 2016-08-30 HISTORY — PX: LUMBAR DISC SURGERY: SHX700

## 2016-09-17 DIAGNOSIS — M4804 Spinal stenosis, thoracic region: Secondary | ICD-10-CM | POA: Diagnosis not present

## 2016-09-17 DIAGNOSIS — M5416 Radiculopathy, lumbar region: Secondary | ICD-10-CM | POA: Diagnosis not present

## 2016-09-17 DIAGNOSIS — M4726 Other spondylosis with radiculopathy, lumbar region: Secondary | ICD-10-CM | POA: Diagnosis not present

## 2016-09-17 DIAGNOSIS — M4316 Spondylolisthesis, lumbar region: Secondary | ICD-10-CM | POA: Diagnosis not present

## 2016-09-17 DIAGNOSIS — M4126 Other idiopathic scoliosis, lumbar region: Secondary | ICD-10-CM | POA: Diagnosis not present

## 2016-09-17 DIAGNOSIS — M48061 Spinal stenosis, lumbar region without neurogenic claudication: Secondary | ICD-10-CM | POA: Diagnosis not present

## 2016-09-18 DIAGNOSIS — Z23 Encounter for immunization: Secondary | ICD-10-CM | POA: Diagnosis not present

## 2016-09-21 DIAGNOSIS — Z1231 Encounter for screening mammogram for malignant neoplasm of breast: Secondary | ICD-10-CM | POA: Diagnosis not present

## 2016-09-22 DIAGNOSIS — M4726 Other spondylosis with radiculopathy, lumbar region: Secondary | ICD-10-CM | POA: Diagnosis not present

## 2016-09-22 DIAGNOSIS — R9431 Abnormal electrocardiogram [ECG] [EKG]: Secondary | ICD-10-CM | POA: Diagnosis not present

## 2016-09-23 DIAGNOSIS — R339 Retention of urine, unspecified: Secondary | ICD-10-CM | POA: Diagnosis not present

## 2016-09-23 DIAGNOSIS — Z87891 Personal history of nicotine dependence: Secondary | ICD-10-CM | POA: Diagnosis not present

## 2016-09-23 DIAGNOSIS — I2510 Atherosclerotic heart disease of native coronary artery without angina pectoris: Secondary | ICD-10-CM | POA: Diagnosis present

## 2016-09-23 DIAGNOSIS — E785 Hyperlipidemia, unspecified: Secondary | ICD-10-CM | POA: Diagnosis present

## 2016-09-23 DIAGNOSIS — T402X5A Adverse effect of other opioids, initial encounter: Secondary | ICD-10-CM | POA: Diagnosis not present

## 2016-09-23 DIAGNOSIS — M48061 Spinal stenosis, lumbar region without neurogenic claudication: Secondary | ICD-10-CM | POA: Diagnosis not present

## 2016-09-23 DIAGNOSIS — R9431 Abnormal electrocardiogram [ECG] [EKG]: Secondary | ICD-10-CM | POA: Diagnosis not present

## 2016-09-23 DIAGNOSIS — Z880 Allergy status to penicillin: Secondary | ICD-10-CM | POA: Diagnosis not present

## 2016-09-23 DIAGNOSIS — R410 Disorientation, unspecified: Secondary | ICD-10-CM | POA: Diagnosis not present

## 2016-09-23 DIAGNOSIS — G8918 Other acute postprocedural pain: Secondary | ICD-10-CM | POA: Diagnosis not present

## 2016-09-23 DIAGNOSIS — Z96641 Presence of right artificial hip joint: Secondary | ICD-10-CM | POA: Diagnosis present

## 2016-09-23 DIAGNOSIS — M5115 Intervertebral disc disorders with radiculopathy, thoracolumbar region: Secondary | ICD-10-CM | POA: Diagnosis not present

## 2016-09-23 DIAGNOSIS — M4726 Other spondylosis with radiculopathy, lumbar region: Secondary | ICD-10-CM | POA: Diagnosis not present

## 2016-09-23 DIAGNOSIS — K5900 Constipation, unspecified: Secondary | ICD-10-CM | POA: Diagnosis not present

## 2016-09-23 DIAGNOSIS — Z85118 Personal history of other malignant neoplasm of bronchus and lung: Secondary | ICD-10-CM | POA: Diagnosis not present

## 2016-09-23 DIAGNOSIS — Z01810 Encounter for preprocedural cardiovascular examination: Secondary | ICD-10-CM | POA: Diagnosis not present

## 2016-09-23 DIAGNOSIS — R400 Somnolence: Secondary | ICD-10-CM | POA: Diagnosis not present

## 2016-09-23 DIAGNOSIS — I10 Essential (primary) hypertension: Secondary | ICD-10-CM | POA: Diagnosis present

## 2016-09-23 DIAGNOSIS — Z8701 Personal history of pneumonia (recurrent): Secondary | ICD-10-CM | POA: Diagnosis not present

## 2016-09-23 DIAGNOSIS — Z923 Personal history of irradiation: Secondary | ICD-10-CM | POA: Diagnosis not present

## 2016-09-23 DIAGNOSIS — M48062 Spinal stenosis, lumbar region with neurogenic claudication: Secondary | ICD-10-CM | POA: Diagnosis not present

## 2016-09-23 DIAGNOSIS — M62830 Muscle spasm of back: Secondary | ICD-10-CM | POA: Diagnosis not present

## 2016-10-08 DIAGNOSIS — I10 Essential (primary) hypertension: Secondary | ICD-10-CM | POA: Diagnosis not present

## 2016-10-08 DIAGNOSIS — Z4789 Encounter for other orthopedic aftercare: Secondary | ICD-10-CM | POA: Diagnosis not present

## 2016-10-08 DIAGNOSIS — Z87891 Personal history of nicotine dependence: Secondary | ICD-10-CM | POA: Diagnosis not present

## 2016-10-08 DIAGNOSIS — M545 Low back pain: Secondary | ICD-10-CM | POA: Diagnosis not present

## 2016-10-08 DIAGNOSIS — Z9889 Other specified postprocedural states: Secondary | ICD-10-CM | POA: Diagnosis not present

## 2016-10-08 DIAGNOSIS — Z9181 History of falling: Secondary | ICD-10-CM | POA: Diagnosis not present

## 2016-10-09 DIAGNOSIS — M545 Low back pain: Secondary | ICD-10-CM | POA: Diagnosis not present

## 2016-10-09 DIAGNOSIS — Z87891 Personal history of nicotine dependence: Secondary | ICD-10-CM | POA: Diagnosis not present

## 2016-10-09 DIAGNOSIS — Z4789 Encounter for other orthopedic aftercare: Secondary | ICD-10-CM | POA: Diagnosis not present

## 2016-10-09 DIAGNOSIS — I10 Essential (primary) hypertension: Secondary | ICD-10-CM | POA: Diagnosis not present

## 2016-10-09 DIAGNOSIS — Z9181 History of falling: Secondary | ICD-10-CM | POA: Diagnosis not present

## 2016-10-09 DIAGNOSIS — Z9889 Other specified postprocedural states: Secondary | ICD-10-CM | POA: Diagnosis not present

## 2016-10-13 DIAGNOSIS — Z87891 Personal history of nicotine dependence: Secondary | ICD-10-CM | POA: Diagnosis not present

## 2016-10-13 DIAGNOSIS — I10 Essential (primary) hypertension: Secondary | ICD-10-CM | POA: Diagnosis not present

## 2016-10-13 DIAGNOSIS — M545 Low back pain: Secondary | ICD-10-CM | POA: Diagnosis not present

## 2016-10-13 DIAGNOSIS — Z4789 Encounter for other orthopedic aftercare: Secondary | ICD-10-CM | POA: Diagnosis not present

## 2016-10-13 DIAGNOSIS — Z9181 History of falling: Secondary | ICD-10-CM | POA: Diagnosis not present

## 2016-10-13 DIAGNOSIS — Z9889 Other specified postprocedural states: Secondary | ICD-10-CM | POA: Diagnosis not present

## 2016-10-15 DIAGNOSIS — I10 Essential (primary) hypertension: Secondary | ICD-10-CM | POA: Diagnosis not present

## 2016-10-15 DIAGNOSIS — Z9889 Other specified postprocedural states: Secondary | ICD-10-CM | POA: Diagnosis not present

## 2016-10-15 DIAGNOSIS — Z87891 Personal history of nicotine dependence: Secondary | ICD-10-CM | POA: Diagnosis not present

## 2016-10-15 DIAGNOSIS — Z4789 Encounter for other orthopedic aftercare: Secondary | ICD-10-CM | POA: Diagnosis not present

## 2016-10-15 DIAGNOSIS — Z9181 History of falling: Secondary | ICD-10-CM | POA: Diagnosis not present

## 2016-10-15 DIAGNOSIS — M545 Low back pain: Secondary | ICD-10-CM | POA: Diagnosis not present

## 2016-10-16 DIAGNOSIS — E7800 Pure hypercholesterolemia, unspecified: Secondary | ICD-10-CM | POA: Diagnosis not present

## 2016-10-16 DIAGNOSIS — M545 Low back pain: Secondary | ICD-10-CM | POA: Diagnosis not present

## 2016-10-16 DIAGNOSIS — I10 Essential (primary) hypertension: Secondary | ICD-10-CM | POA: Diagnosis not present

## 2016-10-16 DIAGNOSIS — Z1211 Encounter for screening for malignant neoplasm of colon: Secondary | ICD-10-CM | POA: Diagnosis not present

## 2016-10-19 DIAGNOSIS — Z87891 Personal history of nicotine dependence: Secondary | ICD-10-CM | POA: Diagnosis not present

## 2016-10-19 DIAGNOSIS — Z9181 History of falling: Secondary | ICD-10-CM | POA: Diagnosis not present

## 2016-10-19 DIAGNOSIS — Z4789 Encounter for other orthopedic aftercare: Secondary | ICD-10-CM | POA: Diagnosis not present

## 2016-10-19 DIAGNOSIS — I10 Essential (primary) hypertension: Secondary | ICD-10-CM | POA: Diagnosis not present

## 2016-10-19 DIAGNOSIS — M545 Low back pain: Secondary | ICD-10-CM | POA: Diagnosis not present

## 2016-10-19 DIAGNOSIS — Z9889 Other specified postprocedural states: Secondary | ICD-10-CM | POA: Diagnosis not present

## 2016-10-21 DIAGNOSIS — Z87891 Personal history of nicotine dependence: Secondary | ICD-10-CM | POA: Diagnosis not present

## 2016-10-21 DIAGNOSIS — Z9889 Other specified postprocedural states: Secondary | ICD-10-CM | POA: Diagnosis not present

## 2016-10-21 DIAGNOSIS — Z9181 History of falling: Secondary | ICD-10-CM | POA: Diagnosis not present

## 2016-10-21 DIAGNOSIS — Z4789 Encounter for other orthopedic aftercare: Secondary | ICD-10-CM | POA: Diagnosis not present

## 2016-10-21 DIAGNOSIS — M545 Low back pain: Secondary | ICD-10-CM | POA: Diagnosis not present

## 2016-10-21 DIAGNOSIS — I10 Essential (primary) hypertension: Secondary | ICD-10-CM | POA: Diagnosis not present

## 2016-10-27 DIAGNOSIS — Z87891 Personal history of nicotine dependence: Secondary | ICD-10-CM | POA: Diagnosis not present

## 2016-10-27 DIAGNOSIS — I10 Essential (primary) hypertension: Secondary | ICD-10-CM | POA: Diagnosis not present

## 2016-10-27 DIAGNOSIS — Z9889 Other specified postprocedural states: Secondary | ICD-10-CM | POA: Diagnosis not present

## 2016-10-27 DIAGNOSIS — Z9181 History of falling: Secondary | ICD-10-CM | POA: Diagnosis not present

## 2016-10-27 DIAGNOSIS — Z4789 Encounter for other orthopedic aftercare: Secondary | ICD-10-CM | POA: Diagnosis not present

## 2016-10-27 DIAGNOSIS — M545 Low back pain: Secondary | ICD-10-CM | POA: Diagnosis not present

## 2016-10-29 ENCOUNTER — Telehealth: Payer: Self-pay | Admitting: *Deleted

## 2016-10-29 ENCOUNTER — Other Ambulatory Visit: Payer: Self-pay | Admitting: Radiation Oncology

## 2016-10-29 DIAGNOSIS — Z87891 Personal history of nicotine dependence: Secondary | ICD-10-CM | POA: Diagnosis not present

## 2016-10-29 DIAGNOSIS — I10 Essential (primary) hypertension: Secondary | ICD-10-CM | POA: Diagnosis not present

## 2016-10-29 DIAGNOSIS — C3412 Malignant neoplasm of upper lobe, left bronchus or lung: Secondary | ICD-10-CM

## 2016-10-29 DIAGNOSIS — Z9889 Other specified postprocedural states: Secondary | ICD-10-CM | POA: Diagnosis not present

## 2016-10-29 DIAGNOSIS — Z9181 History of falling: Secondary | ICD-10-CM | POA: Diagnosis not present

## 2016-10-29 DIAGNOSIS — M545 Low back pain: Secondary | ICD-10-CM | POA: Diagnosis not present

## 2016-10-29 DIAGNOSIS — Z4789 Encounter for other orthopedic aftercare: Secondary | ICD-10-CM | POA: Diagnosis not present

## 2016-10-29 NOTE — Progress Notes (Signed)
  Oncology Nurse Navigator Documentation  Navigator Location: [facility] (10/29/16 1500)   )Navigator Encounter Type: Telephone (10/29/16 1500)  I called patient to check in.  She reports that she had recent back surgery for a "pinched" sciatic nerve.  She says that her pain is relieved and she is recovering well.  We discussed that she does not have any appointments scheduled.  She asked me to follow up with Rad Onc regarding her next scan and appointment.  IB Message sent to Shona Lady, PA.  Patient denies any other questions or concerns at this time.  I encouraged her to call me for any needs.                     Treatment Phase: Follow-up (10/29/16 1500) Barriers/Navigation Needs: Coordination of Care (10/29/16 1500)   Interventions: Coordination of Care (10/29/16 1500)                      Time Spent with Patient: 15 (10/29/16 1500)

## 2016-11-03 DIAGNOSIS — M4726 Other spondylosis with radiculopathy, lumbar region: Secondary | ICD-10-CM | POA: Diagnosis not present

## 2016-11-03 DIAGNOSIS — M4316 Spondylolisthesis, lumbar region: Secondary | ICD-10-CM | POA: Diagnosis not present

## 2016-11-03 DIAGNOSIS — M4696 Unspecified inflammatory spondylopathy, lumbar region: Secondary | ICD-10-CM | POA: Diagnosis not present

## 2016-11-03 DIAGNOSIS — Z9889 Other specified postprocedural states: Secondary | ICD-10-CM | POA: Diagnosis not present

## 2016-11-03 DIAGNOSIS — Z96641 Presence of right artificial hip joint: Secondary | ICD-10-CM | POA: Diagnosis not present

## 2016-11-06 ENCOUNTER — Telehealth: Payer: Self-pay | Admitting: *Deleted

## 2016-11-06 NOTE — Telephone Encounter (Signed)
CALLED PATIENT TO INFORM OF LABS AND CT ON 11-13-16 AND HER FU VISIT WITH ALISON PERKINS ON 12-02-16, LVM FOR A RETURN CALL

## 2016-11-09 ENCOUNTER — Telehealth: Payer: Self-pay | Admitting: *Deleted

## 2016-11-09 NOTE — Telephone Encounter (Signed)
Called patient to inform of new date for lab and Ct, spoke with patient and she is aware of thiese changes

## 2016-11-13 ENCOUNTER — Ambulatory Visit (HOSPITAL_COMMUNITY): Payer: BLUE CROSS/BLUE SHIELD

## 2016-11-13 ENCOUNTER — Ambulatory Visit: Payer: BLUE CROSS/BLUE SHIELD

## 2016-11-19 ENCOUNTER — Ambulatory Visit
Admission: RE | Admit: 2016-11-19 | Discharge: 2016-11-19 | Disposition: A | Discharge status: Home / Self Care | Payer: Medicare Other | Source: Ambulatory Visit | Attending: Radiation Oncology | Admitting: Radiation Oncology

## 2016-11-19 ENCOUNTER — Ambulatory Visit (HOSPITAL_COMMUNITY)
Admission: RE | Admit: 2016-11-19 | Discharge: 2016-11-19 | Disposition: A | Discharge status: Home / Self Care | Payer: Medicare Other | Source: Ambulatory Visit | Attending: Radiation Oncology | Admitting: Radiation Oncology

## 2016-11-19 DIAGNOSIS — J849 Interstitial pulmonary disease, unspecified: Secondary | ICD-10-CM | POA: Diagnosis not present

## 2016-11-19 DIAGNOSIS — B029 Zoster without complications: Secondary | ICD-10-CM | POA: Diagnosis not present

## 2016-11-19 DIAGNOSIS — Z85118 Personal history of other malignant neoplasm of bronchus and lung: Secondary | ICD-10-CM | POA: Diagnosis not present

## 2016-11-19 DIAGNOSIS — C3412 Malignant neoplasm of upper lobe, left bronchus or lung: Secondary | ICD-10-CM

## 2016-11-19 LAB — BASIC METABOLIC PANEL
Anion Gap: 13 mEq/L — ABNORMAL HIGH (ref 3–11)
BUN: 13 mg/dL (ref 7.0–26.0)
CALCIUM: 10.4 mg/dL (ref 8.4–10.4)
CO2: 25 mEq/L (ref 22–29)
Chloride: 101 mEq/L (ref 98–109)
Creatinine: 0.8 mg/dL (ref 0.6–1.1)
EGFR: 77 mL/min/{1.73_m2} — ABNORMAL LOW (ref >90)
GLUCOSE: 100 mg/dL (ref 70–140)
POTASSIUM: 4 meq/L (ref 3.5–5.1)
Sodium: 139 mEq/L (ref 136–145)

## 2016-11-19 MED ORDER — IOPAMIDOL (ISOVUE-300) INJECTION 61%
INTRAVENOUS | Status: DC
Start: 2016-11-19 — End: 2016-11-20
  Filled 2016-11-19: qty 75

## 2016-11-19 MED ORDER — IOPAMIDOL (ISOVUE-300) INJECTION 61%
75.0000 mL | Freq: Once | INTRAVENOUS | Status: AC | PRN
  Administered 2016-11-19: 75 mL via INTRAVENOUS

## 2016-11-20 ENCOUNTER — Telehealth: Payer: Self-pay | Admitting: Radiation Oncology

## 2016-11-20 DIAGNOSIS — C3432 Malignant neoplasm of lower lobe, left bronchus or lung: Secondary | ICD-10-CM

## 2016-11-20 DIAGNOSIS — M545 Low back pain: Secondary | ICD-10-CM | POA: Diagnosis not present

## 2016-11-20 DIAGNOSIS — Z9889 Other specified postprocedural states: Secondary | ICD-10-CM | POA: Diagnosis not present

## 2016-11-20 DIAGNOSIS — C3412 Malignant neoplasm of upper lobe, left bronchus or lung: Secondary | ICD-10-CM

## 2016-11-20 NOTE — Telephone Encounter (Signed)
I called and spoke with the patient to let her know her CT results and that we could forgo her appointment in January as she states she's feeling well overall. She will have her next CT and labs ordered and follow up with me late spring/early summer.

## 2016-11-25 DIAGNOSIS — M545 Low back pain: Secondary | ICD-10-CM | POA: Diagnosis not present

## 2016-11-25 DIAGNOSIS — Z9889 Other specified postprocedural states: Secondary | ICD-10-CM | POA: Diagnosis not present

## 2016-12-01 DIAGNOSIS — M545 Low back pain: Secondary | ICD-10-CM | POA: Diagnosis not present

## 2016-12-01 DIAGNOSIS — Z9889 Other specified postprocedural states: Secondary | ICD-10-CM | POA: Diagnosis not present

## 2016-12-02 ENCOUNTER — Ambulatory Visit: Payer: Self-pay | Admitting: Radiation Oncology

## 2016-12-03 DIAGNOSIS — Z9889 Other specified postprocedural states: Secondary | ICD-10-CM | POA: Diagnosis not present

## 2016-12-03 DIAGNOSIS — M545 Low back pain: Secondary | ICD-10-CM | POA: Diagnosis not present

## 2016-12-08 DIAGNOSIS — Z9889 Other specified postprocedural states: Secondary | ICD-10-CM | POA: Diagnosis not present

## 2016-12-08 DIAGNOSIS — M545 Low back pain: Secondary | ICD-10-CM | POA: Diagnosis not present

## 2016-12-10 DIAGNOSIS — Z1211 Encounter for screening for malignant neoplasm of colon: Secondary | ICD-10-CM | POA: Diagnosis not present

## 2016-12-10 DIAGNOSIS — M545 Low back pain: Secondary | ICD-10-CM | POA: Diagnosis not present

## 2016-12-10 DIAGNOSIS — Z9889 Other specified postprocedural states: Secondary | ICD-10-CM | POA: Diagnosis not present

## 2016-12-15 DIAGNOSIS — Z9889 Other specified postprocedural states: Secondary | ICD-10-CM | POA: Diagnosis not present

## 2016-12-15 DIAGNOSIS — M545 Low back pain: Secondary | ICD-10-CM | POA: Diagnosis not present

## 2016-12-21 DIAGNOSIS — Z9889 Other specified postprocedural states: Secondary | ICD-10-CM | POA: Diagnosis not present

## 2016-12-21 DIAGNOSIS — M545 Low back pain: Secondary | ICD-10-CM | POA: Diagnosis not present

## 2016-12-23 DIAGNOSIS — Z9889 Other specified postprocedural states: Secondary | ICD-10-CM | POA: Diagnosis not present

## 2016-12-23 DIAGNOSIS — M545 Low back pain: Secondary | ICD-10-CM | POA: Diagnosis not present

## 2016-12-28 DIAGNOSIS — M545 Low back pain: Secondary | ICD-10-CM | POA: Diagnosis not present

## 2016-12-28 DIAGNOSIS — Z9889 Other specified postprocedural states: Secondary | ICD-10-CM | POA: Diagnosis not present

## 2016-12-31 DIAGNOSIS — M545 Low back pain: Secondary | ICD-10-CM | POA: Diagnosis not present

## 2016-12-31 DIAGNOSIS — Z9889 Other specified postprocedural states: Secondary | ICD-10-CM | POA: Diagnosis not present

## 2017-01-04 DIAGNOSIS — M545 Low back pain: Secondary | ICD-10-CM | POA: Diagnosis not present

## 2017-01-04 DIAGNOSIS — Z9889 Other specified postprocedural states: Secondary | ICD-10-CM | POA: Diagnosis not present

## 2017-01-07 DIAGNOSIS — M5416 Radiculopathy, lumbar region: Secondary | ICD-10-CM | POA: Diagnosis not present

## 2017-01-07 DIAGNOSIS — Z9889 Other specified postprocedural states: Secondary | ICD-10-CM | POA: Diagnosis not present

## 2017-01-07 DIAGNOSIS — M4726 Other spondylosis with radiculopathy, lumbar region: Secondary | ICD-10-CM | POA: Diagnosis not present

## 2017-01-11 DIAGNOSIS — M9953 Intervertebral disc stenosis of neural canal of lumbar region: Secondary | ICD-10-CM | POA: Diagnosis not present

## 2017-01-11 DIAGNOSIS — M4726 Other spondylosis with radiculopathy, lumbar region: Secondary | ICD-10-CM | POA: Diagnosis not present

## 2017-01-11 DIAGNOSIS — M5116 Intervertebral disc disorders with radiculopathy, lumbar region: Secondary | ICD-10-CM | POA: Diagnosis not present

## 2017-01-11 DIAGNOSIS — Z9889 Other specified postprocedural states: Secondary | ICD-10-CM | POA: Diagnosis not present

## 2017-01-11 DIAGNOSIS — M48061 Spinal stenosis, lumbar region without neurogenic claudication: Secondary | ICD-10-CM | POA: Diagnosis not present

## 2017-01-11 DIAGNOSIS — M4807 Spinal stenosis, lumbosacral region: Secondary | ICD-10-CM | POA: Diagnosis not present

## 2017-01-11 DIAGNOSIS — M9954 Intervertebral disc stenosis of neural canal of sacral region: Secondary | ICD-10-CM | POA: Diagnosis not present

## 2017-01-14 DIAGNOSIS — Z9889 Other specified postprocedural states: Secondary | ICD-10-CM | POA: Diagnosis not present

## 2017-01-14 DIAGNOSIS — M545 Low back pain: Secondary | ICD-10-CM | POA: Diagnosis not present

## 2017-01-18 DIAGNOSIS — M545 Low back pain: Secondary | ICD-10-CM | POA: Diagnosis not present

## 2017-01-18 DIAGNOSIS — H1131 Conjunctival hemorrhage, right eye: Secondary | ICD-10-CM | POA: Diagnosis not present

## 2017-01-18 DIAGNOSIS — Z9889 Other specified postprocedural states: Secondary | ICD-10-CM | POA: Diagnosis not present

## 2017-01-21 DIAGNOSIS — M545 Low back pain: Secondary | ICD-10-CM | POA: Diagnosis not present

## 2017-01-21 DIAGNOSIS — Z9889 Other specified postprocedural states: Secondary | ICD-10-CM | POA: Diagnosis not present

## 2017-01-28 DIAGNOSIS — M545 Low back pain: Secondary | ICD-10-CM | POA: Diagnosis not present

## 2017-01-28 DIAGNOSIS — Z9889 Other specified postprocedural states: Secondary | ICD-10-CM | POA: Diagnosis not present

## 2017-02-01 ENCOUNTER — Telehealth: Payer: Self-pay | Admitting: *Deleted

## 2017-02-01 NOTE — Telephone Encounter (Signed)
CALLED PATIENT TO INFORM OF FU APPT. ON 05-25-17 HAS BEEN MOVED TO 05-27-17 @ 10 AM WITH ASHLUN BRUNING, LVM FOR A RETURN CALL

## 2017-02-02 DIAGNOSIS — M5416 Radiculopathy, lumbar region: Secondary | ICD-10-CM | POA: Diagnosis not present

## 2017-02-18 DIAGNOSIS — H5203 Hypermetropia, bilateral: Secondary | ICD-10-CM | POA: Diagnosis not present

## 2017-02-18 DIAGNOSIS — H52221 Regular astigmatism, right eye: Secondary | ICD-10-CM | POA: Diagnosis not present

## 2017-02-18 DIAGNOSIS — H25813 Combined forms of age-related cataract, bilateral: Secondary | ICD-10-CM | POA: Diagnosis not present

## 2017-02-18 DIAGNOSIS — G510 Bell's palsy: Secondary | ICD-10-CM | POA: Diagnosis not present

## 2017-03-11 DIAGNOSIS — M4186 Other forms of scoliosis, lumbar region: Secondary | ICD-10-CM | POA: Diagnosis not present

## 2017-03-11 DIAGNOSIS — Z9889 Other specified postprocedural states: Secondary | ICD-10-CM | POA: Diagnosis not present

## 2017-03-11 DIAGNOSIS — M5117 Intervertebral disc disorders with radiculopathy, lumbosacral region: Secondary | ICD-10-CM | POA: Diagnosis not present

## 2017-03-11 DIAGNOSIS — M5115 Intervertebral disc disorders with radiculopathy, thoracolumbar region: Secondary | ICD-10-CM | POA: Diagnosis not present

## 2017-03-11 DIAGNOSIS — M5185 Other intervertebral disc disorders, thoracolumbar region: Secondary | ICD-10-CM | POA: Diagnosis not present

## 2017-03-11 DIAGNOSIS — M5186 Other intervertebral disc disorders, lumbar region: Secondary | ICD-10-CM | POA: Diagnosis not present

## 2017-03-11 DIAGNOSIS — M4317 Spondylolisthesis, lumbosacral region: Secondary | ICD-10-CM | POA: Diagnosis not present

## 2017-03-11 DIAGNOSIS — M4727 Other spondylosis with radiculopathy, lumbosacral region: Secondary | ICD-10-CM | POA: Diagnosis not present

## 2017-03-11 DIAGNOSIS — M4726 Other spondylosis with radiculopathy, lumbar region: Secondary | ICD-10-CM | POA: Diagnosis not present

## 2017-03-11 DIAGNOSIS — M5187 Other intervertebral disc disorders, lumbosacral region: Secondary | ICD-10-CM | POA: Diagnosis not present

## 2017-03-26 DIAGNOSIS — H7402 Tympanosclerosis, left ear: Secondary | ICD-10-CM | POA: Diagnosis not present

## 2017-03-26 DIAGNOSIS — J342 Deviated nasal septum: Secondary | ICD-10-CM | POA: Diagnosis not present

## 2017-03-26 DIAGNOSIS — H93291 Other abnormal auditory perceptions, right ear: Secondary | ICD-10-CM | POA: Diagnosis not present

## 2017-03-26 DIAGNOSIS — Z87891 Personal history of nicotine dependence: Secondary | ICD-10-CM | POA: Diagnosis not present

## 2017-03-26 DIAGNOSIS — Z7289 Other problems related to lifestyle: Secondary | ICD-10-CM | POA: Diagnosis not present

## 2017-03-26 DIAGNOSIS — Z923 Personal history of irradiation: Secondary | ICD-10-CM | POA: Diagnosis not present

## 2017-03-26 DIAGNOSIS — Z85118 Personal history of other malignant neoplasm of bronchus and lung: Secondary | ICD-10-CM | POA: Diagnosis not present

## 2017-03-26 DIAGNOSIS — H6121 Impacted cerumen, right ear: Secondary | ICD-10-CM | POA: Diagnosis not present

## 2017-04-15 DIAGNOSIS — M545 Low back pain: Secondary | ICD-10-CM | POA: Diagnosis not present

## 2017-04-15 DIAGNOSIS — E7800 Pure hypercholesterolemia, unspecified: Secondary | ICD-10-CM | POA: Diagnosis not present

## 2017-04-15 DIAGNOSIS — I10 Essential (primary) hypertension: Secondary | ICD-10-CM | POA: Diagnosis not present

## 2017-04-27 DIAGNOSIS — H2512 Age-related nuclear cataract, left eye: Secondary | ICD-10-CM | POA: Diagnosis not present

## 2017-04-27 DIAGNOSIS — H25013 Cortical age-related cataract, bilateral: Secondary | ICD-10-CM | POA: Diagnosis not present

## 2017-04-27 DIAGNOSIS — H2513 Age-related nuclear cataract, bilateral: Secondary | ICD-10-CM | POA: Diagnosis not present

## 2017-04-27 DIAGNOSIS — H25043 Posterior subcapsular polar age-related cataract, bilateral: Secondary | ICD-10-CM | POA: Diagnosis not present

## 2017-04-27 DIAGNOSIS — H18413 Arcus senilis, bilateral: Secondary | ICD-10-CM | POA: Diagnosis not present

## 2017-05-10 ENCOUNTER — Telehealth: Payer: Self-pay | Admitting: *Deleted

## 2017-05-10 NOTE — Telephone Encounter (Signed)
CALLED PATIENT TO INFORM OF STAT LABS ON 05-24-17 @ 9:45 AM @ [facility] AND HER CT ON 05-24-17 @ 11 AM @[facility], PT. TO BE NPO - 4 HRS. PRIOR TO TEST AND HER FU WITH ALISON PERKINS ON 05-25-17 @ 10:15 AM, SPOKE WITH PATIENT AND SHE IS AWARE OF THESE APPTS.

## 2017-05-17 ENCOUNTER — Telehealth: Payer: Self-pay | Admitting: *Deleted

## 2017-05-17 NOTE — Telephone Encounter (Signed)
Called patient to ask about coming for fu on 05-31-17, pt. agreed to come

## 2017-05-20 NOTE — Progress Notes (Signed)
[name] [name] 76 y.o. woman with squamous cell carcinoma of the left upper lobe of the lung - clinical stage IA radiation completed 03-08-13,well differentiated adenocarcinoma of the left lower lobe of the lung - clinical stage IA radiation completed 01-29-14,review 05-24-17 CT chest w contrast, FU.       Weight changes, if any: Wt Readings from Last 3 Encounters:  05/31/17 136 lb 6.4 oz (61.9 kg)  03/12/16 132 lb 9.6 oz (60.1 kg)  03/21/15 136 lb 14.4 oz (62.1 kg)   Respiratory complaints, if any: SOB with exertion,coughing from sinus drainage Hemoptysis, if any:No   Swallowing Problems/Pain/Difficulty swallowing:None Appetite :Good eating three meals a day. Pain:No When is next chemo scheduled?:N/A Imaging:05-24-17 CT chest w contrast Lab work from of chart: 05-24-17 Bmet BP (!) 150/70   Pulse 67   Temp 97.9 F (36.6 C) (Oral)   Resp 18   Ht 5' 2.5" (1.588 m)   Wt 136 lb 6.4 oz (61.9 kg)   SpO2 97%   BMI 24.55 kg/m

## 2017-05-24 ENCOUNTER — Ambulatory Visit
Admission: RE | Admit: 2017-05-24 | Discharge: 2017-05-24 | Disposition: A | Discharge status: Home / Self Care | Payer: Medicare Other | Source: Ambulatory Visit | Attending: Radiation Oncology | Admitting: Radiation Oncology

## 2017-05-24 ENCOUNTER — Ambulatory Visit (HOSPITAL_COMMUNITY)
Admission: RE | Admit: 2017-05-24 | Discharge: 2017-05-24 | Disposition: A | Discharge status: Home / Self Care | Payer: Medicare Other | Source: Ambulatory Visit | Attending: Radiation Oncology | Admitting: Radiation Oncology

## 2017-05-24 DIAGNOSIS — I700 Atherosclerosis of aorta: Secondary | ICD-10-CM | POA: Diagnosis not present

## 2017-05-24 DIAGNOSIS — R911 Solitary pulmonary nodule: Secondary | ICD-10-CM | POA: Insufficient documentation

## 2017-05-24 DIAGNOSIS — Z87891 Personal history of nicotine dependence: Secondary | ICD-10-CM | POA: Insufficient documentation

## 2017-05-24 DIAGNOSIS — Z880 Allergy status to penicillin: Secondary | ICD-10-CM | POA: Insufficient documentation

## 2017-05-24 DIAGNOSIS — Z9851 Tubal ligation status: Secondary | ICD-10-CM | POA: Insufficient documentation

## 2017-05-24 DIAGNOSIS — Z9889 Other specified postprocedural states: Secondary | ICD-10-CM | POA: Insufficient documentation

## 2017-05-24 DIAGNOSIS — C3432 Malignant neoplasm of lower lobe, left bronchus or lung: Secondary | ICD-10-CM

## 2017-05-24 DIAGNOSIS — C3412 Malignant neoplasm of upper lobe, left bronchus or lung: Secondary | ICD-10-CM | POA: Diagnosis not present

## 2017-05-24 DIAGNOSIS — J439 Emphysema, unspecified: Secondary | ICD-10-CM | POA: Insufficient documentation

## 2017-05-24 DIAGNOSIS — I2510 Atherosclerotic heart disease of native coronary artery without angina pectoris: Secondary | ICD-10-CM | POA: Diagnosis not present

## 2017-05-24 DIAGNOSIS — M8448XA Pathological fracture, other site, initial encounter for fracture: Secondary | ICD-10-CM | POA: Insufficient documentation

## 2017-05-24 DIAGNOSIS — C3492 Malignant neoplasm of unspecified part of left bronchus or lung: Secondary | ICD-10-CM | POA: Insufficient documentation

## 2017-05-24 DIAGNOSIS — Z96641 Presence of right artificial hip joint: Secondary | ICD-10-CM | POA: Insufficient documentation

## 2017-05-24 DIAGNOSIS — Z8249 Family history of ischemic heart disease and other diseases of the circulatory system: Secondary | ICD-10-CM | POA: Insufficient documentation

## 2017-05-24 DIAGNOSIS — E785 Hyperlipidemia, unspecified: Secondary | ICD-10-CM | POA: Insufficient documentation

## 2017-05-24 DIAGNOSIS — I10 Essential (primary) hypertension: Secondary | ICD-10-CM | POA: Insufficient documentation

## 2017-05-24 LAB — BASIC METABOLIC PANEL
ANION GAP: 11 meq/L (ref 3–11)
BUN: 12.5 mg/dL (ref 7.0–26.0)
CO2: 28 mEq/L (ref 22–29)
CREATININE: 0.7 mg/dL (ref 0.6–1.1)
Calcium: 10.5 mg/dL — ABNORMAL HIGH (ref 8.4–10.4)
Chloride: 100 mEq/L (ref 98–109)
EGFR: 80 mL/min/{1.73_m2} — ABNORMAL LOW (ref >90)
Glucose: 97 mg/dl (ref 70–140)
POTASSIUM: 4 meq/L (ref 3.5–5.1)
Sodium: 139 mEq/L (ref 136–145)

## 2017-05-24 MED ORDER — IOPAMIDOL (ISOVUE-300) INJECTION 61%
INTRAVENOUS | Status: AC
  Filled 2017-05-24: qty 75

## 2017-05-24 MED ORDER — IOPAMIDOL (ISOVUE-300) INJECTION 61%
75.0000 mL | Freq: Once | INTRAVENOUS | Status: AC | PRN
  Administered 2017-05-24: 75 mL via INTRAVENOUS

## 2017-05-25 ENCOUNTER — Ambulatory Visit: Admission: RE | Admit: 2017-05-25 | Payer: Medicare Other | Source: Ambulatory Visit | Admitting: Radiation Oncology

## 2017-05-31 ENCOUNTER — Encounter: Payer: Self-pay | Admitting: Radiation Oncology

## 2017-05-31 ENCOUNTER — Ambulatory Visit
Admission: RE | Admit: 2017-05-31 | Discharge: 2017-05-31 | Disposition: A | Discharge status: Home / Self Care | Payer: Medicare Other | Source: Ambulatory Visit | Attending: Radiation Oncology | Admitting: Radiation Oncology

## 2017-05-31 VITALS — BP 150/70 | HR 67 | Temp 97.9°F | Resp 18 | Ht 62.5 in | Wt 136.4 lb

## 2017-05-31 DIAGNOSIS — I2510 Atherosclerotic heart disease of native coronary artery without angina pectoris: Secondary | ICD-10-CM | POA: Diagnosis not present

## 2017-05-31 DIAGNOSIS — Z9851 Tubal ligation status: Secondary | ICD-10-CM | POA: Diagnosis not present

## 2017-05-31 DIAGNOSIS — R911 Solitary pulmonary nodule: Secondary | ICD-10-CM

## 2017-05-31 DIAGNOSIS — R918 Other nonspecific abnormal finding of lung field: Secondary | ICD-10-CM | POA: Diagnosis not present

## 2017-05-31 DIAGNOSIS — Z923 Personal history of irradiation: Secondary | ICD-10-CM | POA: Diagnosis not present

## 2017-05-31 DIAGNOSIS — M8448XA Pathological fracture, other site, initial encounter for fracture: Secondary | ICD-10-CM | POA: Diagnosis not present

## 2017-05-31 DIAGNOSIS — Z96641 Presence of right artificial hip joint: Secondary | ICD-10-CM | POA: Diagnosis not present

## 2017-05-31 DIAGNOSIS — Z9889 Other specified postprocedural states: Secondary | ICD-10-CM | POA: Diagnosis not present

## 2017-05-31 DIAGNOSIS — E785 Hyperlipidemia, unspecified: Secondary | ICD-10-CM | POA: Diagnosis not present

## 2017-05-31 DIAGNOSIS — Z87891 Personal history of nicotine dependence: Secondary | ICD-10-CM | POA: Diagnosis not present

## 2017-05-31 DIAGNOSIS — Z880 Allergy status to penicillin: Secondary | ICD-10-CM | POA: Diagnosis not present

## 2017-05-31 DIAGNOSIS — C3492 Malignant neoplasm of unspecified part of left bronchus or lung: Secondary | ICD-10-CM | POA: Diagnosis not present

## 2017-05-31 DIAGNOSIS — I10 Essential (primary) hypertension: Secondary | ICD-10-CM | POA: Diagnosis not present

## 2017-05-31 DIAGNOSIS — C3432 Malignant neoplasm of lower lobe, left bronchus or lung: Secondary | ICD-10-CM | POA: Diagnosis not present

## 2017-05-31 DIAGNOSIS — Z8249 Family history of ischemic heart disease and other diseases of the circulatory system: Secondary | ICD-10-CM | POA: Diagnosis not present

## 2017-05-31 MED ORDER — LEVOFLOXACIN 750 MG PO TABS: 750.0000 mg | ORAL_TABLET | Freq: Every day | ORAL | 0 refills | Status: AC

## 2017-05-31 NOTE — Addendum Note (Signed)
Encounter addended by: Malena Edman, RN on: 05/31/2017  2:10 PM<BR>    Actions taken: Charge Capture section accepted

## 2017-05-31 NOTE — Progress Notes (Signed)
Radiation Oncology         [phone] ________________________________  Name: [name] [name] MRN: [id number]  Date: 05/31/2017  DOB: 04-15-1941  Follow-Up Visit Note  CC: Alroy Dust, L.Marlou Sa, MD  Rigoberto Noel, MD  Diagnosis: 76 y.o. female 2 separate stage I A adenocarcinoma of the left lung    ICD-10-CM   1. Pulmonary nodule R91.1 NM PET Image Restag (PS) Skull Base To Thigh    Interval Since Last Radiation:  3 years, 3 months                         SBRT:  01/22/2014, 01/24/2014, 01/29/2014 - to 45 gray in 3 fractions to the left lower lobe  SBRT:  03/01/2013, 03/03/2013, 03/08/2013 - to 54 Gy in 3 fractionsto the left upper lobe  Narrative:  [name] [name] is a pleasant 76 y.o. female with a history of two separate Stage IA, NSCLC, adenocarcinoma of the left lower lobe in 2015, and squamous cell carcinoma. She has been treated on two occasions with SBRT. And has been followed since her most recent treatment in 2015 without disease. She underwent CT of the chest on 05/24/17 which revealed a new 4 mm lesion in the right upper lobe, as well as some fibrotic changes of the left lobes which were previously treated. She comes today to discuss these findings.  On review of systems, the patient reports that she is doing well overall. She denies any chest pain, shortness of breath, cough, fevers, chills, night sweats, unintended weight changes. She has however had post nasal drip and in the morning feels like she's had sinus pressure. She denies any bowel or bladder disturbances, and denies abdominal pain, nausea or vomiting. She denies any new musculoskeletal or joint aches or pains, new skin lesions or concerns. A complete review of systems is obtained and is otherwise negative.    Past Medical History: Past Medical History:  Diagnosis Date  . Allergy   . Anxiety   . Arthritis    a. 10/2012 s/p Right THA.  Marland Kitchen History of ARDS   . Hyperlipidemia   . Hypertension   . Lung cancer ([place]) 02/16/13     LUL lung SQUAMOUS CELL  . Lung cancer ([place]) dx'd 12/2013    LLL ADENOCARCINOMA  . Pulmonary nodule    a. 10/2012 CT: 29mm anterior LUL nodule w/ spiculated appearance - rec PET CT.   Surgical History:  Past Surgical History:  Procedure Laterality Date  . LUNG BIOPSY Left 02/16/13   LUL =non small cell ca with assoc/necrosis  . TONSILLECTOMY    . TOTAL HIP ARTHROPLASTY  11/03/2012   Procedure: TOTAL HIP ARTHROPLASTY ANTERIOR APPROACH;  Surgeon: Mcarthur Rossetti, MD;  Location: [facility];  Service: Orthopedics;  Laterality: Right;  Right Total Hip Arthroplasty, Anterior Approach (C-Arm)  . TUBAL LIGATION     Social History:  Social History  Substance Use Topics  . Smoking status: Former Smoker    Packs/day: 1.00    Years: 40.00    Types: Cigarettes    Quit date: 11/03/2012  . Smokeless tobacco: Never Used  . Alcohol use Yes     Comment: 2-3 drinks of liquor nightly    Family History: Family History  Problem Relation Age of Onset  . CAD Father    Health Care Maintenance: The patient reports that she is up-to-date with mammography, colonoscopy, and will  keep [place] informed as she undergoes these procedures again.  ALLERGIES:  is allergic to penicillins.  Meds: Current Outpatient Prescriptions  Medication Sig Dispense Refill  . acetaminophen (TYLENOL) 500 MG tablet Take 1,000 mg by mouth every 6 (six) hours as needed for pain.    Marland Kitchen amLODipine-benazepril (LOTREL) 5-20 MG per capsule Take 1 capsule by mouth daily before breakfast.     . atorvastatin (LIPITOR) 40 MG tablet Take 20 mg by mouth every morning.    . etodolac (LODINE) 400 MG tablet     . hydrochlorothiazide (HYDRODIURIL) 25 MG tablet Take 25 mg by mouth daily before breakfast.     . ibuprofen (ADVIL,MOTRIN) 200 MG tablet Take 400 mg by mouth every 6 (six) hours as needed for pain.    Marland Kitchen levofloxacin (LEVAQUIN) 750 MG tablet Take 1 tablet (750 mg total) by mouth daily. 10 tablet 0   No current facility-administered  medications for this encounter.     Physical Findings:  height is 5' 2.5" (1.588 m) and weight is 136 lb 6.4 oz (61.9 kg). Her oral temperature is 97.9 F (36.6 C). Her blood pressure is 150/70 (abnormal) and her pulse is 67. Her respiration is 18 and oxygen saturation is 97%.   Pain scale 0/10 In general this is a well appearing Caucasian female in no acute distress. She is alert and oriented x4 and appropriate throughout the examination. HEENT reveals that the patient is normocephalic, atraumatic. EOMs are intact. PERRLA. Skin is intact without any evidence of gross lesions. Cardiovascular exam reveals a regular rate and rhythm, no clicks rubs or murmurs are auscultated. Chest is clear to auscultation bilaterally. Lymphatic assessment is performed and does not reveal any adenopathy in the cervical, supraclavicular, axillary, or inguinal chains. Abdomen has active bowel sounds in all quadrants and is intact. The abdomen is soft, non tender, non distended. Lower extremities are negative for pretibial pitting edema, deep calf tenderness, cyanosis or clubbing.   Lab Findings: Lab Results  Component Value Date   WBC 6.1 01/04/2014   HGB 13.5 01/04/2014   HCT 38.8 01/04/2014   PLT 223 01/04/2014    Lab Results  Component Value Date   NA 139 05/24/2017   K 4.0 05/24/2017   CHLORIDE 100 05/24/2017   CO2 28 05/24/2017   GLUCOSE 97 05/24/2017   BUN 12.5 05/24/2017   CREATININE 0.7 05/24/2017   BILITOT 0.7 11/17/2012   ALKPHOS 73 11/17/2012   AST 25 11/17/2012   ALT 23 11/17/2012   PROT 6.3 11/17/2012   ALBUMIN 3.1 (L) 11/17/2012   CALCIUM 10.5 (H) 05/24/2017   ANIONGAP 11 05/24/2017    Radiographic Findings: Ct Chest W Contrast  Result Date: 05/24/2017 CLINICAL DATA:  Left upper lobe primary bronchogenic squamous cell carcinoma status post SBRT completed 03/08/2013. Left lower lobe primary bronchogenic adenocarcinoma completed SBRT 01/29/2014. Restaging. EXAM: CT CHEST WITH CONTRAST  TECHNIQUE: Multidetector CT imaging of the chest was performed during intravenous contrast administration. CONTRAST:  79mL ISOVUE-300 IOPAMIDOL (ISOVUE-300) INJECTION 61% COMPARISON:  11/19/2016 chest CT. FINDINGS: Cardiovascular: Normal heart size. Stable trace pericardial effusion/ thickening. Left main, left anterior descending, left circumflex and right coronary atherosclerosis. Atherosclerotic nonaneurysmal thoracic aorta. No central pulmonary emboli. Mediastinum/Nodes: No discrete thyroid nodules. Unremarkable esophagus. No pathologically enlarged axillary, mediastinal or hilar lymph nodes. Lungs/Pleura: No pneumothorax. No pleural effusion. Mild paraseptal emphysema. New irregular solid 4 mm medial right upper lobe pulmonary nodule (series 7/ image 28). Stable bandlike consolidation in the anterior left upper lobe with associated distortion and volume loss, compatible with radiation fibrosis. Stable bandlike consolidation  in the parahilar left mid lung involving the lingula and left lower lobe, with associated volume loss and distortion, compatible with radiation fibrosis. No acute consolidative airspace disease or additional significant pulmonary nodules. Patchy confluent subpleural reticulation, ground-glass attenuation and mild traction bronchiectasis throughout both lungs with this suggestion of mild honeycombing in the anterior right upper lobe, with slight progression of these findings in the interval and clear progression back to the 06/13/2013 chest CT. Upper abdomen: Subcentimeter hypodense liver dome lesion is too small to characterize and stable, suggesting a benign lesion. Simple 1.6 cm renal cyst in the upper left kidney. Musculoskeletal: No aggressive appearing focal osseous lesions. Stable nonunion of minimally displaced lateral left fifth rib fracture. Marked thoracic spondylosis. IMPRESSION: 1. New irregular solid 4 mm medial right upper lobe pulmonary nodule. Early metachronous primary  bronchogenic carcinoma or metastasis not excluded. Recommend attention on follow-up chest CT in 3 months. 2. No evidence of local tumor recurrence at the irradiated sites in the left upper and left lower lobes, with stable radiation fibrosis in these locations. 3. No additional potential findings of metastatic disease in the chest . 4. Slight interval progression of fibrotic interstitial lung disease with mild honeycombing, which has clearly progressed compared to more remote chest CT studies from 2014, most compatible with usual interstitial pneumonia (UIP). 5. Left main and 3 vessel coronary atherosclerosis. Aortic Atherosclerosis (ICD10-I70.0) and Emphysema (ICD10-J43.9). Electronically Signed   By: Ilona Sorrel M.D.   On: 05/24/2017 14:22    Impression/Plan: 1.  Clinical stage IA non-small cell carcinoma of the left lung. We discussed the findings from her recent imaging as well as her symptoms that could be contributing to some of the findings on scan. I would recommend a course of antibiotics, followed by a PET scan. She is in agreement and we will proceed with her PET the week of 06/14/17. She is in agreement, and we discussed the risks of the PET revealing a new cancer, and that we could consider options of SBRT potentially again. She is in agreement with this plan 2. Chronic left rib fracture. This will be followed closely but she is asymptomatic currently from this.       Carola Rhine, PAC

## 2017-06-03 ENCOUNTER — Telehealth: Payer: Self-pay | Admitting: *Deleted

## 2017-06-03 NOTE — Telephone Encounter (Signed)
CALLED PATIENT TO INFORM OF PET SCAN FOR 06-08-17 , ARRIVAL TIME - 12 PM , PT. TO BE NPO- 6 HRS. PRIOR TO TEST, TEST TO BE @ [facility], SPOKE WITH PATIENT AND SHE IS AWARE OF THIS TEST.

## 2017-06-04 ENCOUNTER — Telehealth: Payer: Self-pay | Admitting: *Deleted

## 2017-06-04 NOTE — Telephone Encounter (Signed)
CALLED PATIENT TO INFORM THAT SCAN HAS BEEN MOVED TO 06/23/17 @ [facility] [facility] [facility], LVM FOR A RETURN CALL

## 2017-06-09 ENCOUNTER — Ambulatory Visit (HOSPITAL_BASED_OUTPATIENT_CLINIC_OR_DEPARTMENT_OTHER)
Admission: RE | Admit: 2017-06-09 | Discharge: 2017-06-09 | Disposition: A | Discharge status: Home / Self Care | Payer: Medicare Other | Source: Ambulatory Visit | Attending: Radiation Oncology | Admitting: Radiation Oncology

## 2017-06-09 ENCOUNTER — Telehealth: Payer: Self-pay | Admitting: Radiation Oncology

## 2017-06-09 ENCOUNTER — Telehealth: Payer: Self-pay | Admitting: *Deleted

## 2017-06-09 DIAGNOSIS — R0602 Shortness of breath: Secondary | ICD-10-CM

## 2017-06-09 DIAGNOSIS — Z85118 Personal history of other malignant neoplasm of bronchus and lung: Secondary | ICD-10-CM | POA: Insufficient documentation

## 2017-06-09 DIAGNOSIS — I2510 Atherosclerotic heart disease of native coronary artery without angina pectoris: Secondary | ICD-10-CM | POA: Insufficient documentation

## 2017-06-09 DIAGNOSIS — C3432 Malignant neoplasm of lower lobe, left bronchus or lung: Secondary | ICD-10-CM | POA: Diagnosis present

## 2017-06-09 DIAGNOSIS — C3412 Malignant neoplasm of upper lobe, left bronchus or lung: Secondary | ICD-10-CM | POA: Diagnosis present

## 2017-06-09 DIAGNOSIS — J8410 Pulmonary fibrosis, unspecified: Secondary | ICD-10-CM | POA: Diagnosis not present

## 2017-06-09 DIAGNOSIS — Z923 Personal history of irradiation: Secondary | ICD-10-CM | POA: Diagnosis not present

## 2017-06-09 DIAGNOSIS — J984 Other disorders of lung: Secondary | ICD-10-CM | POA: Diagnosis not present

## 2017-06-09 DIAGNOSIS — J439 Emphysema, unspecified: Secondary | ICD-10-CM | POA: Insufficient documentation

## 2017-06-09 DIAGNOSIS — C3492 Malignant neoplasm of unspecified part of left bronchus or lung: Secondary | ICD-10-CM | POA: Diagnosis not present

## 2017-06-09 DIAGNOSIS — I700 Atherosclerosis of aorta: Secondary | ICD-10-CM | POA: Insufficient documentation

## 2017-06-09 DIAGNOSIS — R0600 Dyspnea, unspecified: Secondary | ICD-10-CM | POA: Diagnosis not present

## 2017-06-09 MED ORDER — DOXYCYCLINE HYCLATE 100 MG PO TABS: 100.0000 mg | ORAL_TABLET | Freq: Two times a day (BID) | ORAL | 0 refills | Status: DC

## 2017-06-09 MED ORDER — IOPAMIDOL (ISOVUE-370) INJECTION 76%
100.0000 mL | Freq: Once | INTRAVENOUS | Status: AC | PRN
  Administered 2017-06-09: 100 mL via INTRAVENOUS

## 2017-06-09 NOTE — Telephone Encounter (Signed)
I spoke with the patient to let her know her and CT was negative for PE and revealed increased honeycombing concerning for interstitial pneumonia. I called in a new prescription for abx for Doxycycline 100 mg BID x 7 days. She had taken her BP at home and it was 152/82 and Pulse was 128, and repeated this after resting for 5 minutes and her BP was 123/52, pulse was 119. She will be seen either by [place] or by pulmonary tomorrow for formal evaluation or to the ED if symptoms progress, or vitals change (with parameters given).

## 2017-06-09 NOTE — Telephone Encounter (Addendum)
I called the patient to check on how she's doing since antibiotics. She's developed acute onset of shortness of breath in the last few days. She's been to [place] to be at the hospital with her sister who's recently had surgery and has been winded in walking from her car into the hospital. Yesterday and today, she's had trouble walking much shorter distances. She was planning to go to [facility] to be with her sister until Saturday. She is winded on the phone, and admits to tachycardia. I recommended that she proceed with CTPA to rule out a clot as her symptoms came on abruptly, she has had some lower extremity edema, and has a history of malignancy. Her wells score is concerning for PE. Orders were placed for stat scans. If this is negative we would have her meet with pulmonary versus ED evaluation.

## 2017-06-09 NOTE — Telephone Encounter (Signed)
Called patient to inform of STAT CT for today @ [facility], test to be @ 3 pm, spoke with patient and she is aware of this scan.

## 2017-06-10 ENCOUNTER — Telehealth: Payer: Self-pay | Admitting: Pulmonary Disease

## 2017-06-10 NOTE — Telephone Encounter (Signed)
Received an email from RA stating that the patient needed an appt with him on July 13th. Called patient and scheduled her for July 13th at 1045am at the [facility] office. She verbalized understanding. Nothing further was needed at time of call.

## 2017-06-11 ENCOUNTER — Other Ambulatory Visit (INDEPENDENT_AMBULATORY_CARE_PROVIDER_SITE_OTHER): Payer: Medicare Other

## 2017-06-11 ENCOUNTER — Encounter: Payer: Self-pay | Admitting: Pulmonary Disease

## 2017-06-11 ENCOUNTER — Ambulatory Visit (INDEPENDENT_AMBULATORY_CARE_PROVIDER_SITE_OTHER): Payer: Medicare Other | Admitting: Pulmonary Disease

## 2017-06-11 DIAGNOSIS — I10 Essential (primary) hypertension: Secondary | ICD-10-CM | POA: Diagnosis not present

## 2017-06-11 DIAGNOSIS — J84112 Idiopathic pulmonary fibrosis: Secondary | ICD-10-CM

## 2017-06-11 DIAGNOSIS — R911 Solitary pulmonary nodule: Secondary | ICD-10-CM

## 2017-06-11 LAB — SEDIMENTATION RATE: Sed Rate: 6 mm/hr (ref 0–30)

## 2017-06-11 MED ORDER — AMLODIPINE BESYLATE 10 MG PO TABS: 10.0000 mg | ORAL_TABLET | Freq: Every day | ORAL | 3 refills | Status: DC

## 2017-06-11 NOTE — Patient Instructions (Signed)
Blood work today Schedule pulmonary function testing  We discussed the 2 kinds of pulmonary fibrosis - based on your test results, we will decide if you qualify for treatment  Okay to proceed with cataract surgery  Suggest that you stop taking Lotrel -since this can also cause a cough Prescription for amlodipine 10 mg daily instead #30

## 2017-06-11 NOTE — Assessment & Plan Note (Signed)
Suggest that you stop taking Lotrel -since this can also cause a cough Prescription for amlodipine 10 mg daily instead #30

## 2017-06-11 NOTE — Assessment & Plan Note (Signed)
Will need 6 month follow-up CT for right upper lobe nodule

## 2017-06-11 NOTE — Progress Notes (Signed)
Subjective:    Patient ID: [name] [name], female    DOB: 14-Oct-1941, 76 y.o.   MRN: [id number]  HPI  76 yo ex- smoker With a history of lung cancer Referred for evaluation of interstitial pneumonia/ ILD  She was seen in 2013 after an acute illness for interstitial pneumonia/ARDS requiring mechanical ventilation. Infiltrates seem to improve with diuresis and steroids and she was able to come off oxygen. Workup at that time included low positive RA factor were negative ANA, CCP, hypersensitivity panel She underwent SBRT on 2 separate occasions for stage IA lung cancer Left upper lobe  squamous cell carcinoma status post SBRT completed 03/08/2013.  Left lower lobe  adenocarcinoma status post SBRT completed 01/29/2014.   She was lost to follow-up with me but has been following up with radiation oncology. She underwent surveillance CT chest in 05/24/17 which showed new 4 mm nodule in the right upper lobe and bilateral interstitial infiltrates. She reports progressive dyspnea over the last 6 months and also reported some acute worsening. Hence she underwent CT angiogram but suggested ILD and she was referred to [place]. She was also noted to be tachycardic. She received Levaquin for 7 days and then was given doxycycline  She reports a dry cough and progressive dyspnea. She denies pedal edema, skin rash. She reports stiffness of her hands when she wakes up in the morning without joint pains.  She desaturated from 96-93% on walking and heart rate increased from 102-110.  She is accompanied by her son Sam today. She was able to quit smoking in 2013, more than 40 pack years. She reports a 20 pound gain in the last year due to chronic back pain and pain medications  Significant tests/ events reviewed 10/2012 >> ARDS Requiring mechanical ventilation   PFTs  01/2013 - no obstruction, mild restriction, FVC 77%, DLCO decreased at 57%    Past Medical History:  Diagnosis Date  . Allergy   . Anxiety   .  Arthritis    a. 10/2012 s/p Right THA.  Marland Kitchen History of ARDS   . Hyperlipidemia   . Hypertension   . Lung cancer ([place]) 02/16/13   LUL lung SQUAMOUS CELL  . Lung cancer ([place]) dx'd 12/2013    LLL ADENOCARCINOMA  . Pulmonary nodule    a. 10/2012 CT: 73mm anterior LUL nodule w/ spiculated appearance - rec PET CT.     Past Surgical History:  Procedure Laterality Date  . LUNG BIOPSY Left 02/16/13   LUL =non small cell ca with assoc/necrosis  . TONSILLECTOMY    . TOTAL HIP ARTHROPLASTY  11/03/2012   Procedure: TOTAL HIP ARTHROPLASTY ANTERIOR APPROACH;  Surgeon: Mcarthur Rossetti, MD;  Location: [facility];  Service: Orthopedics;  Laterality: Right;  Right Total Hip Arthroplasty, Anterior Approach (C-Arm)  . TUBAL LIGATION      Allergies  Allergen Reactions  . Penicillins Hives and Swelling   Social History   Social History  . Marital status: Divorced    Spouse name: N/A  . Number of children: N/A  . Years of education: N/A   Occupational History  . Not on file.   Social History Main Topics  . Smoking status: Former Smoker    Packs/day: 1.00    Years: 40.00    Types: Cigarettes    Quit date: 11/03/2012  . Smokeless tobacco: Never Used  . Alcohol use Yes     Comment: 2-3 drinks of liquor nightly  . Drug use: No  . Sexual activity:  Not on file   Other Topics Concern  . Not on file   Social History Narrative  . No narrative on file      Breast Cancer-relatedfamily history is not on file.   Review of Systems Positive for dyspnea and dry cough  Constitutional: negative for anorexia, fevers and sweats  Eyes: negative for irritation, redness and visual disturbance  Ears, nose, mouth, throat, and face: negative for earaches, epistaxis, nasal congestion and sore throat  Respiratory: negative for  sputum and wheezing  Cardiovascular: negative for chest pain,  lower extremity edema, orthopnea, palpitations and syncope  Gastrointestinal: negative for abdominal pain,  constipation, diarrhea, melena, nausea and vomiting  Genitourinary:negative for dysuria, frequency and hematuria  Hematologic/lymphatic: negative for bleeding, easy bruising and lymphadenopathy  Musculoskeletal:negative for arthralgias, muscle weakness and stiff joints  Neurological: negative for coordination problems, gait problems, headaches and weakness  Endocrine: negative for diabetic symptoms including polydipsia, polyuria and weight loss     Objective:   Physical Exam  Gen. Pleasant, well-nourished, in no distress, normal affect ENT - no lesions, no post nasal drip Neck: No JVD, no thyromegaly, no carotid bruits Lungs: no use of accessory muscles, no dullness to percussion, bibasal fine rales, no rhonchi  Cardiovascular: Rhythm regular, heart sounds  normal, no murmurs or gallops, no peripheral edema Abdomen: soft and non-tender, no hepatosplenomegaly, BS normal. Musculoskeletal: No deformities, no cyanosis or clubbing Neuro:  alert, non focal       Assessment & Plan:

## 2017-06-11 NOTE — Assessment & Plan Note (Addendum)
Check ANA, CCP and ESR Schedule pulmonary function testing  I reviewed her imaging dating back to 2013. Acute interstitial pneumonia with groundglass infiltrates in 2013 appears to resolve and takes the form of chronic infiltrates mostly peripheral and subpleural but does not have a predominant bibasal Distribution Favor IPF, although radiologic appearance not typical - we will treat as such if serology remains negative. She only seems to have stiffness in her hands but nothing to suggest collagen vascular disease  Okay to proceed with cataract surgery   

## 2017-06-14 DIAGNOSIS — H52222 Regular astigmatism, left eye: Secondary | ICD-10-CM | POA: Diagnosis not present

## 2017-06-14 DIAGNOSIS — H5212 Myopia, left eye: Secondary | ICD-10-CM | POA: Diagnosis not present

## 2017-06-14 DIAGNOSIS — H2512 Age-related nuclear cataract, left eye: Secondary | ICD-10-CM | POA: Diagnosis not present

## 2017-06-14 DIAGNOSIS — Z961 Presence of intraocular lens: Secondary | ICD-10-CM | POA: Diagnosis not present

## 2017-06-14 LAB — CYCLIC CITRUL PEPTIDE ANTIBODY, IGG: Cyclic Citrullin Peptide Ab: <16 Units

## 2017-06-15 DIAGNOSIS — H2511 Age-related nuclear cataract, right eye: Secondary | ICD-10-CM | POA: Diagnosis not present

## 2017-06-15 LAB — ANTI-NUCLEAR AB-TITER (ANA TITER): ANA Titer 1: 1:40 {titer} — ABNORMAL HIGH

## 2017-06-15 LAB — ANA: ANA: POSITIVE — AB

## 2017-06-17 ENCOUNTER — Telehealth: Payer: Self-pay | Admitting: Pulmonary Disease

## 2017-06-17 MED ORDER — LOSARTAN POTASSIUM 50 MG PO TABS: 50.0000 mg | ORAL_TABLET | Freq: Every day | ORAL | 0 refills | Status: DC

## 2017-06-17 NOTE — Telephone Encounter (Signed)
RA  Please Advise-  Pt called in and stated during her last OV on 7/13, you suggested she stop her old blood pressure medication of Lotrel due to it possibly making her cough. Pt states she has tried the amlodipine and it is not working for her, instead of helping her bp stay low it has been running high in the 170s, so pt states she has decided to go back on her old medication and wanted to make you aware.

## 2017-06-17 NOTE — Addendum Note (Signed)
Addended by: Elie Confer on: 06/17/2017 03:50 PM   Modules accepted: Orders

## 2017-06-17 NOTE — Telephone Encounter (Signed)
Called and spoke with pt and she is aware of RA recs.  Med has been sent to her pharmacy and nothing further is needed.

## 2017-06-17 NOTE — Telephone Encounter (Signed)
She should change to losartan 50 mg daily instead of going back on Lotrel which may be worsening her cough Okay to send prescription for 30 tablets if willing to change- if the blood pressure remains high, can increased 100 mg daily

## 2017-06-23 ENCOUNTER — Ambulatory Visit
Admission: RE | Admit: 2017-06-23 | Discharge: 2017-06-23 | Disposition: A | Discharge status: Home / Self Care | Payer: Medicare Other | Source: Ambulatory Visit | Attending: Radiation Oncology | Admitting: Radiation Oncology

## 2017-06-23 DIAGNOSIS — R911 Solitary pulmonary nodule: Secondary | ICD-10-CM | POA: Diagnosis not present

## 2017-06-23 DIAGNOSIS — J439 Emphysema, unspecified: Secondary | ICD-10-CM | POA: Diagnosis not present

## 2017-06-23 DIAGNOSIS — C3490 Malignant neoplasm of unspecified part of unspecified bronchus or lung: Secondary | ICD-10-CM | POA: Diagnosis not present

## 2017-06-23 DIAGNOSIS — K8020 Calculus of gallbladder without cholecystitis without obstruction: Secondary | ICD-10-CM | POA: Diagnosis not present

## 2017-06-23 DIAGNOSIS — I700 Atherosclerosis of aorta: Secondary | ICD-10-CM | POA: Diagnosis not present

## 2017-06-23 DIAGNOSIS — Q6101 Congenital single renal cyst: Secondary | ICD-10-CM | POA: Diagnosis not present

## 2017-06-23 DIAGNOSIS — I517 Cardiomegaly: Secondary | ICD-10-CM | POA: Insufficient documentation

## 2017-06-23 LAB — GLUCOSE, CAPILLARY: GLUCOSE-CAPILLARY: 90 mg/dL (ref 65–99)

## 2017-06-23 MED ORDER — FLUDEOXYGLUCOSE F - 18 (FDG) INJECTION
13.0500 | Freq: Once | INTRAVENOUS | Status: AC | PRN
  Administered 2017-06-23: 13.05 via INTRAVENOUS

## 2017-06-24 ENCOUNTER — Telehealth: Payer: Self-pay | Admitting: Radiation Oncology

## 2017-06-24 NOTE — Telephone Encounter (Signed)
I spoke with the patient to let her know the findings on her PET and that we've reached out to Dr. Elsworth Soho and Dr. Lamonte Sakai to see if they think she needs a bronch with EBUS. I will let her know once we've spoken. She reports that since completing doxycycline she feels much better and will see Dr. Bari Mantis PA on 07/07/17 for PFTs.

## 2017-06-28 ENCOUNTER — Telehealth: Payer: Self-pay | Admitting: Pulmonary Disease

## 2017-06-28 NOTE — Telephone Encounter (Signed)
Please see result notes.  

## 2017-06-29 ENCOUNTER — Ambulatory Visit (INDEPENDENT_AMBULATORY_CARE_PROVIDER_SITE_OTHER): Payer: Medicare Other | Admitting: Pulmonary Disease

## 2017-06-29 ENCOUNTER — Encounter: Payer: Self-pay | Admitting: Pulmonary Disease

## 2017-06-29 VITALS — BP 160/80 | HR 93 | Ht 62.0 in | Wt 135.2 lb

## 2017-06-29 DIAGNOSIS — R590 Localized enlarged lymph nodes: Secondary | ICD-10-CM | POA: Diagnosis not present

## 2017-06-29 DIAGNOSIS — R591 Generalized enlarged lymph nodes: Secondary | ICD-10-CM

## 2017-06-29 DIAGNOSIS — I10 Essential (primary) hypertension: Secondary | ICD-10-CM | POA: Diagnosis not present

## 2017-06-29 DIAGNOSIS — J84112 Idiopathic pulmonary fibrosis: Secondary | ICD-10-CM

## 2017-06-29 MED ORDER — LOSARTAN POTASSIUM 100 MG PO TABS: 100.0000 mg | ORAL_TABLET | Freq: Every day | ORAL | 0 refills | Status: DC

## 2017-06-29 MED ORDER — AMLODIPINE BESYLATE 10 MG PO TABS: 10.0000 mg | ORAL_TABLET | Freq: Every day | ORAL | 0 refills | Status: AC

## 2017-06-29 NOTE — Patient Instructions (Signed)
Prescription for losartan 100 mg daily #14 Prescription for Norvasc 10 mg daily #30  Check your blood pressure every 2 days  Schedule CT chest with IV contrast in 2 months to follow-up on enlarged lymph nodes

## 2017-06-29 NOTE — Assessment & Plan Note (Signed)
Since PET scan was obtained prematurely, unclear if  hypermetabolic subcarinal lymph node was related to infectious/inflammatory process in the lungs versus malignancy  Hence would advise repeat CT chest with IV contrast in 2 months, if persistent subcarinal lymphadenopathy then we'll proceed with bronchoscopy with EBUS

## 2017-06-29 NOTE — Progress Notes (Signed)
   Subjective:    Patient ID: [name] [name], female    DOB: [birth year], 76 y.o.   MRN: [id number]  HPI  76 yo ex- smoker With a history of lung cancer Referred for FU of interstitial pneumonia/ ILD She reports dyspnea on exertion and a dry cough  She was seen in 2013 after an acute illness for interstitial pneumonia/ARDS requiring mechanical ventilation. Infiltrates seem to improve with diuresis and steroids and she was able to come off oxygen. Workup at that time included low positive RA factor were negative ANA, CCP, hypersensitivity panel She underwent SBRT on 2 separate occasions for stage IA lung cancer Left upper lobe  squamous cell carcinoma status post SBRT completed 03/08/2013.  Left lower lobe  adenocarcinoma status post SBRT completed 01/29/2014.   She underwent surveillance CT chest  By radiation oncology in 05/24/17 which showed new 4 mm nodule in the right upper lobe and bilateral interstitial infiltrates. Hence she underwent CT angiogram but suggested ILD and she was referred to [place]. She was also noted to be tachycardic. She received Levaquin for 7 days and then was given doxycycline. Follow-up PET scan 7/25 showed subcarinal lymph node about 1 cm hypermetabolic SUV 4.2, other nodules were not hypermetabolic  On her last visit, Lotrel was stopped, her cough is improved, but blood pressure remains high in spite of increasing losartan  To 100 mg for the last 2 days She underwent cataract surgery, uneventful   She was able to quit smoking in 2013, more than 40 pack years. She reports a 20 pound gain in the last year due to chronic back pain and pain medications  Significant tests/ events reviewed 10/2012 >> ARDS Requiring mechanical ventilation  PFTs  01/2013 - no obstruction, mild restriction, FVC 77%, DLCO decreased at 57%   PET 05/2017 subcarinal LN SUV 4.2   Past Medical History:  Diagnosis Date  . Allergy   . Anxiety   . Arthritis    a. 10/2012 s/p Right THA.  Marland Kitchen  History of ARDS   . Hyperlipidemia   . Hypertension   . Lung cancer ([place]) 02/16/13   LUL lung SQUAMOUS CELL  . Lung cancer ([place]) dx'd 12/2013    LLL ADENOCARCINOMA  . Pulmonary nodule    a. 10/2012 CT: 48mm anterior LUL nodule w/ spiculated appearance - rec PET CT.     Review of Systems neg for any significant sore throat, dysphagia, itching, sneezing, nasal congestion or excess/ purulent secretions, fever, chills, sweats, unintended wt loss, pleuritic or exertional cp, hempoptysis, orthopnea pnd or change in chronic leg swelling. Also denies presyncope, palpitations, heartburn, abdominal pain, nausea, vomiting, diarrhea or change in bowel or urinary habits, dysuria,hematuria, rash, arthralgias, visual complaints, headache, numbness weakness or ataxia.     Objective:   Physical Exam   Gen. Pleasant, well-nourished, in no distress, normal affect ENT - no lesions, no post nasal drip Neck: No JVD, no thyromegaly, no carotid bruits Lungs: no use of accessory muscles, no dullness to percussion, bibasal rales, no rhonchi  Cardiovascular: Rhythm regular, heart sounds  normal, no murmurs or gallops, no peripheral edema Abdomen: soft and non-tender, no hepatosplenomegaly, BS normal. Musculoskeletal: No deformities, no cyanosis or clubbing Neuro:  alert, non focal        Assessment & Plan:

## 2017-06-29 NOTE — Assessment & Plan Note (Signed)
Still uncontrolled after switched from Lotrel to losartan 100  will add Norvasc 10 Further follow-up will be with PCP, she will check her blood pressure every 2 days and call us back if remains high until PCP appointment

## 2017-06-29 NOTE — Assessment & Plan Note (Signed)
Favor IPF, although radiologic appearance not typical

## 2017-07-05 DIAGNOSIS — H5201 Hypermetropia, right eye: Secondary | ICD-10-CM | POA: Diagnosis not present

## 2017-07-05 DIAGNOSIS — H5212 Myopia, left eye: Secondary | ICD-10-CM | POA: Diagnosis not present

## 2017-07-05 DIAGNOSIS — Z9849 Cataract extraction status, unspecified eye: Secondary | ICD-10-CM | POA: Diagnosis not present

## 2017-07-05 DIAGNOSIS — H52223 Regular astigmatism, bilateral: Secondary | ICD-10-CM | POA: Diagnosis not present

## 2017-07-05 DIAGNOSIS — H2513 Age-related nuclear cataract, bilateral: Secondary | ICD-10-CM | POA: Diagnosis not present

## 2017-07-05 DIAGNOSIS — H2511 Age-related nuclear cataract, right eye: Secondary | ICD-10-CM | POA: Diagnosis not present

## 2017-07-05 DIAGNOSIS — Z961 Presence of intraocular lens: Secondary | ICD-10-CM | POA: Diagnosis not present

## 2017-07-07 ENCOUNTER — Ambulatory Visit (INDEPENDENT_AMBULATORY_CARE_PROVIDER_SITE_OTHER): Payer: Medicare Other | Admitting: Pulmonary Disease

## 2017-07-07 ENCOUNTER — Encounter: Payer: Self-pay | Admitting: Pulmonary Disease

## 2017-07-07 VITALS — BP 134/70 | HR 94 | Ht 62.0 in | Wt 134.0 lb

## 2017-07-07 DIAGNOSIS — I10 Essential (primary) hypertension: Secondary | ICD-10-CM

## 2017-07-07 DIAGNOSIS — J84112 Idiopathic pulmonary fibrosis: Secondary | ICD-10-CM

## 2017-07-07 DIAGNOSIS — R591 Generalized enlarged lymph nodes: Secondary | ICD-10-CM | POA: Diagnosis not present

## 2017-07-07 LAB — PULMONARY FUNCTION TEST
DL/VA % pred: 82 %
DL/VA: 3.75 ml/min/mmHg/L
DLCO UNC % PRED: 43 %
DLCO UNC: 9.37 ml/min/mmHg
DLCO cor % pred: 41 %
DLCO cor: 8.89 ml/min/mmHg
FEF 25-75 POST: 1.76 L/s
FEF 25-75 Pre: 2.17 L/sec
FEF2575-%Change-Post: -18 %
FEF2575-%PRED-POST: 111 %
FEF2575-%Pred-Pre: 137 %
FEV1-%CHANGE-POST: 1 %
FEV1-%PRED-POST: 71 %
FEV1-%PRED-PRE: 70 %
FEV1-PRE: 1.36 L
FEV1-Post: 1.38 L
FEV1FVC-%CHANGE-POST: 1 %
FEV1FVC-%PRED-PRE: 124 %
FEV6-%Change-Post: 0 %
FEV6-%Pred-Post: 58 %
FEV6-%Pred-Pre: 59 %
FEV6-POST: 1.45 L
FEV6-Pre: 1.46 L
FEV6FVC-%Pred-Post: 105 %
FEV6FVC-%Pred-Pre: 105 %
FVC-%Change-Post: 0 %
FVC-%PRED-PRE: 56 %
FVC-%Pred-Post: 55 %
FVC-POST: 1.45 L
FVC-PRE: 1.46 L
POST FEV1/FVC RATIO: 95 %
PRE FEV6/FVC RATIO: 100 %
Post FEV6/FVC ratio: 100 %
Pre FEV1/FVC ratio: 93 %

## 2017-07-07 NOTE — Patient Instructions (Addendum)
1.  We will arrange for your follow up CT scan to review your enlarged lymph node 2.  Continue your blood pressure medications as prescribed > your blood pressure is much improved 3.  Follow up with Dr. Elsworth Soho after CT scan completed to further discuss results and if biopsy needed 4.  Call if you do not have enough of your blood pressure medication (before you can get in touch with your primary MD 5.  Call if new or worsening symptoms 6.  Health Maintenance > remember to get your flu shot in the fall, continue to walk / stay active, practice good hand hygiene

## 2017-07-07 NOTE — Progress Notes (Signed)
PFT done today. 

## 2017-07-07 NOTE — Progress Notes (Signed)
[facility]   Chief Complaint  Patient presents with  . Follow-up    Pt had a PFT today and is here to go over the results. Denies any complaints or concerns for today. Pt only has SOB and CP on exertion; denies any cough.     Primary Pulmonologist: Dr. Elsworth Soho  Current Outpatient Prescriptions on File Prior to Visit  Medication Sig  . acetaminophen (TYLENOL) 500 MG tablet Take 1,000 mg by mouth every 6 (six) hours as needed for pain.  Marland Kitchen amLODipine (NORVASC) 10 MG tablet Take 1 tablet (10 mg total) by mouth daily.  Marland Kitchen atorvastatin (LIPITOR) 40 MG tablet Take 20 mg by mouth every morning.  . etodolac (LODINE) 400 MG tablet Take 400 mg by mouth daily as needed.   . hydrochlorothiazide (HYDRODIURIL) 25 MG tablet Take 25 mg by mouth daily before breakfast.   . ibuprofen (ADVIL,MOTRIN) 200 MG tablet Take 400 mg by mouth every 6 (six) hours as needed for pain.  Marland Kitchen losartan (COZAAR) 100 MG tablet Take 1 tablet (100 mg total) by mouth daily.   No current facility-administered medications on file prior to visit.      Studies: 05/24/17  CT Chest >> New irregular solid 4 mm medial right upper lobe pulmonary nodule. Early metachronous primary bronchogenic carcinoma or metastasis not excluded.  No evidence of local tumor recurrence at the irradiated sites in the left upper and left lower lobes, with stable radiation fibrosis in these locations.  No additional potential findings of metastatic disease in the chest.  Slight interval progression of fibrotic interstitial lung disease with mild honeycombing, which has clearly progressed compared to more remote chest CT studies from 2014, most compatible with usual interstitial pneumonia (UIP).  Left main and 3 vessel coronary atherosclerosis.   06/09/17  CTA Chest >> No pulmonary embolism. No acute consolidative airspace disease to suggest a pneumonia.  Spectrum of findings compatible with fibrotic interstitial lung disease with mild honeycombing,  mildly progressed, most compatible with usual interstitial pneumonia (UIP). Stable radiation fibrosis in the left upper and left lower lobes with no evidence of local tumor recurrence in the left lung. Two 4 mm pulmonary nodules in the right upper lung, indeterminate for metastases. Recommend attention on follow-up chest CT in 3 Months.  Two-vessel coronary atherosclerosis.   06/23/17  NM PET Restaging >> The more posterior of the 2 subcarinal lymph nodes is not pathologically enlarged but is faintly hypermetabolic. At this level of metabolism this could be reactive or due to low-grade neoplasm.  In the lungs there some consolidation on the left with low-grade associated activity probably from radiation fibrosis or radiation pneumonitis, superimposed on background chronic interstitial lung disease and emphysema. Please note that the several tiny pulmonarynodules seen on recent chest CT are too small to characterize by PET-CT as they are below sensitive PET-CT size thresholds. Striking atherosclerosis. This includes the coronary arteries. Aortic Atherosclerosis (ICD10-I70.0) and Emphysema (ICD10-J43.9). Multiple left renal cysts including a approximately 1 cm suspected complex cyst. 2 mm left kidney lower pole nonobstructive renal calculus. Cholelithiasis. Mild cardiomegaly  07/07/17 PFT >> FEV1/FVC 93 / 124%, FVC 1.46 / 56%, FEV1 1.36 / 70, TLC 2.10 / 44%, DLCO 8.89 / 41%  Past Medical Hx:  has a past medical history of Allergy; Anxiety; Arthritis; History of ARDS; Hyperlipidemia; Hypertension; Lung cancer ([place]) (02/16/13); Lung cancer ([place]) (dx'd 12/2013); and Pulmonary nodule.   Past Surgical hx, Allergies, Family hx, Social hx all reviewed.  Vital Signs BP 134/70 (BP  Location: Left Arm, Patient Position: Sitting, Cuff Size: Normal)   Pulse 94   Ht 5\' 2"  (1.575 m)   Wt 134 lb (60.8 kg)   SpO2 96%   BMI 24.51 kg/m   History of Present Illness [name] [name] is a 76 y.o. female, former smoker  (quit in 2013, 40+ pack years) with a history of ARDS in 10/2012 after a hip replacement / acute illness with interstitial pneumonia, stage IA lung cancer, LUL squamous cell carcinoma s/p SBRT in 02/2013 and LLL adenocarcinoma s/p SBRT 01/29/14 who presented to the pulmonary office for PFT assessment.   She reports she had a CT of the chest in June 2018 after she was called by the cancer center for follow and they felt she sounded dyspneic.  She had the CT on 05/24/17 which showed a new 77mm  Nodule in the RUL and bilateral interstitial infiltrates.  She then had a CTA of the chest in 05/2017 which was suggestive of ILD.  She was treated with levaquin for 7 days and then doxycycline.  Follow up PET CT 06/23/17 showed subcarinal lymph node approximately 1cm which was hypermetabolic (SUV 4.2).  The other nodules were not hypermetabolic.  She then followed up with Dr. Haze Boyden and was taken off her Lotrel which improved.  Her Losartan was also increased.  At baseline, she is not very active and has gained weight over the last year due to chronic pain.    She returns 8/8 for follow up PFT's which show moderate restriction.  The patient reports she has SOB with steps > two flights at home especially with carrying stuff.  Parking lot to front door of office / not short of breath.  She doesn't walk or exert herself that much due to back and hip pain.  No problems in the grocery store but she leans on the cart.  Has been recommended for fusion of vertebrae but has put it off.  Uses cane in the hospital or when she knows she has to walk for long distance.    Reports she did not have follow up CT scan arranged as of yet.     Physical Exam  General - well developed adult F in no acute distress ENT - No sinus tenderness, no oral exudate, no LAN Cardiac - s1s2 regular, no murmur Chest - even/non-labored, lungs bilaterally with posterior dry crackles 2/3 way up. No wheezing. Back - No focal tenderness Abd - Soft,  non-tender Ext - No edema Neuro - Normal strength Skin - No rashes Psych - normal mood, and behavior   Assessment/Plan  Discussion:  76 y/o F, former smoker, with complex medical hx to include ARDS, two separate occasions of lung cancer s/p SBRT and concern for recent PNA.  Most recent PET scan with concern for a hypermetabolic lymph node but this is difficult to interpret in the setting of possible recent infection.  CT worrisome for ILD.  PFT's show progression of restrictive lung disease > ? If this is related to progression of IPF vs radiation changes.    Plan: Follow up CT with contrast to assess LAN in 2 months  Follow up with Dr. Elsworth Soho post CT completion  Discussed the possibility of biopsy of lymph node if remains enlarged and also the concept of pirfenidone vs OFEV for IPF (pending CT review in 2 months)  Patient Instructions  1.  We will arrange for your follow up CT scan to review your enlarged lymph node 2.  Continue your  blood pressure medications as prescribed > your blood pressure is much improved 3.  Follow up with Dr. Elsworth Soho after CT scan completed to further discuss results and if biopsy needed 4.  Call if you do not have enough of your blood pressure medication (before you can get in touch with your primary MD 5.  Call if new or worsening symptoms 6.  Health Maintenance > remember to get your flu shot in the fall, continue to walk / stay active, practice good hand hygiene   Noe Gens, NP-[facility]  [phone] 07/07/2017, 10:10 PM

## 2017-07-20 DIAGNOSIS — E7800 Pure hypercholesterolemia, unspecified: Secondary | ICD-10-CM | POA: Diagnosis not present

## 2017-07-20 DIAGNOSIS — I10 Essential (primary) hypertension: Secondary | ICD-10-CM | POA: Diagnosis not present

## 2017-08-25 ENCOUNTER — Other Ambulatory Visit (INDEPENDENT_AMBULATORY_CARE_PROVIDER_SITE_OTHER): Payer: Medicare Other

## 2017-08-25 DIAGNOSIS — R591 Generalized enlarged lymph nodes: Secondary | ICD-10-CM

## 2017-08-25 LAB — BASIC METABOLIC PANEL
BUN: 17 mg/dL (ref 6–23)
CALCIUM: 10.3 mg/dL (ref 8.4–10.5)
CHLORIDE: 99 meq/L (ref 96–112)
CO2: 29 meq/L (ref 19–32)
CREATININE: 0.84 mg/dL (ref 0.40–1.20)
GFR: 70.36 mL/min (ref >60.00)
GLUCOSE: 110 mg/dL — AB (ref 70–99)
Potassium: 4.5 mEq/L (ref 3.5–5.1)
Sodium: 137 mEq/L (ref 135–145)

## 2017-08-31 ENCOUNTER — Ambulatory Visit (INDEPENDENT_AMBULATORY_CARE_PROVIDER_SITE_OTHER)
Admission: RE | Admit: 2017-08-31 | Discharge: 2017-08-31 | Disposition: A | Discharge status: Home / Self Care | Payer: Medicare Other | Source: Ambulatory Visit | Attending: Pulmonary Disease | Admitting: Pulmonary Disease

## 2017-08-31 DIAGNOSIS — J8410 Pulmonary fibrosis, unspecified: Secondary | ICD-10-CM | POA: Diagnosis not present

## 2017-08-31 DIAGNOSIS — R591 Generalized enlarged lymph nodes: Secondary | ICD-10-CM

## 2017-08-31 MED ORDER — IOPAMIDOL (ISOVUE-300) INJECTION 61%
80.0000 mL | Freq: Once | INTRAVENOUS | Status: AC | PRN
  Administered 2017-08-31: 80 mL via INTRAVENOUS

## 2017-09-02 ENCOUNTER — Telehealth: Payer: Self-pay | Admitting: Radiation Oncology

## 2017-09-02 NOTE — Telephone Encounter (Signed)
I spoke with the patient and let her know I would continue to peripherally follow her case along given her prior treatment. I will look for Dr. Bari Mantis note next week and follow up in about 6 months as well to see where she stands.

## 2017-09-06 DIAGNOSIS — Z23 Encounter for immunization: Secondary | ICD-10-CM | POA: Diagnosis not present

## 2017-09-10 ENCOUNTER — Ambulatory Visit (INDEPENDENT_AMBULATORY_CARE_PROVIDER_SITE_OTHER): Payer: Medicare Other | Admitting: Pulmonary Disease

## 2017-09-10 ENCOUNTER — Telehealth (HOSPITAL_COMMUNITY): Payer: Self-pay

## 2017-09-10 ENCOUNTER — Encounter: Payer: Self-pay | Admitting: Pulmonary Disease

## 2017-09-10 DIAGNOSIS — R590 Localized enlarged lymph nodes: Secondary | ICD-10-CM

## 2017-09-10 DIAGNOSIS — R911 Solitary pulmonary nodule: Secondary | ICD-10-CM

## 2017-09-10 DIAGNOSIS — J84112 Idiopathic pulmonary fibrosis: Secondary | ICD-10-CM | POA: Diagnosis not present

## 2017-09-10 NOTE — Assessment & Plan Note (Signed)
Resolved

## 2017-09-10 NOTE — Telephone Encounter (Signed)
Called patient to discuss Pulmonary Rehab. Scheduled pt's orientation for 09/17/17 @ 9:30am. Pt will be attending the 1:30pm exercise classes.

## 2017-09-10 NOTE — Assessment & Plan Note (Signed)
We discussed progression of idiopathic pulmonary fibrosis  Start OFEV 75 twice daily and increase gradually to full dose 150 twice daily  Referral to pulmonary rehabilitation  Obtain liver function test from PCP, if not done then we will obtain baseline and then every month for 3 months  She does seem to have progressive phenotype, lung function has dropped by about 20% compared to 2014 and HRCT also shows worsening. Hence reasonable to start on medication-shared decision making  Greater than 50% time was spent in counseling and coordination of care with the patient  We also discussed lung transplant

## 2017-09-10 NOTE — Assessment & Plan Note (Signed)
Stable on follow-up CT. Will need 6 month follow-up given her prior history of lung cancer

## 2017-09-10 NOTE — Addendum Note (Signed)
Addended by: Valerie Salts on: 09/10/2017 11:49 AM   Modules accepted: Orders

## 2017-09-10 NOTE — Progress Notes (Signed)
   Subjective:    Patient ID: [name] [name], female    DOB: 1941/11/21, 76 y.o.   MRN: [id number]  HPI  76yo ex-smoker With a history of lung cancer  for FU of interstitial pneumonia/ ILD She reports dyspnea on exertion and a dry cough  She was seen in 2013 after an acute illness for interstitial pneumonia/ARDS requiring mechanical ventilation.Infiltrates seem to improve with diuresis and steroids and she was able to come off oxygen. Workup at that time included low positive RA factor with negative ANA, CCP, hypersensitivity panel She underwent SBRT on 2 separate occasions for stage IA lung cancer Left upper lobe squamous cell carcinoma status post SBRT completed 03/08/2013. Left lower lobe adenocarcinoma status post SBRT completed 01/29/2014.   She underwent surveillance CT chest  By radiation oncology in 05/24/17 which showed new 4 mm nodule in the right upper lobe and bilateral interstitial infiltrates. Hence she underwent CT angiogram but suggested ILD and she was referred to [place].  Follow-up PET scan 7/25 showed subcarinal lymph node about 1 cm hypermetabolic SUV 4.2, other nodules were not hypermetabolic  She presents with her son and daughter after follow-up CT scan which shows resolution of mediastinal lymphadenopathy, persistent right upper lobe 4 mm nodule and changes of fibrosis, probable UIP pattern. She continues to have shortness of breath. Cough has resolved. Blood pressure is now controlled on losartan and amlodipine  She was able to quit smoking in 2013,more than 40 pack years. She reports a 20 pound gain in the last year due to chronic back pain and pain medications  We had extensive discussion today for more than 30 minutes regarding prognosis of her IPF  Significant tests/ events reviewed 10/2012 >>ARDS Requiring mechanical ventilation  PFTs 01/2013 - no obstruction, mild restriction, FVC 77%, DLCO decreased at 57%  PFTs 06/2017 show moderate restriction  with ratio 93, FVC 56%, FEV1 70%, TLC 44% with DLCO 43%  PET 05/2017 subcarinal LN SUV 4.2   Past Medical History:  Diagnosis Date  . Allergy   . Anxiety   . Arthritis    a. 10/2012 s/p Right THA.  Marland Kitchen History of ARDS   . Hyperlipidemia   . Hypertension   . Lung cancer ([place]) 02/16/13   LUL lung SQUAMOUS CELL  . Lung cancer ([place]) dx'd 12/2013    LLL ADENOCARCINOMA  . Pulmonary nodule    a. 10/2012 CT: 39mm anterior LUL nodule w/ spiculated appearance - rec PET CT.     Review of Systems Cough is improved, positive for shortness of breath with activity  neg for any significant sore throat, dysphagia, itching, sneezing, nasal congestion or excess/ purulent secretions, fever, chills, sweats, unintended wt loss, pleuritic or exertional cp, hempoptysis, orthopnea pnd or change in chronic leg swelling. Also denies presyncope, palpitations, heartburn, abdominal pain, nausea, vomiting, diarrhea or change in bowel or urinary habits, dysuria,hematuria, rash, arthralgias, visual complaints, headache, numbness weakness or ataxia.     Objective:   Physical Exam   Gen. Pleasant, well-nourished, in no distress, normal affect ENT - no lesions, no post nasal drip Neck: No JVD, no thyromegaly, no carotid bruits Lungs: no use of accessory muscles, no dullness to percussion, bibasal rales 1/2,no rhonchi  Cardiovascular: Rhythm regular, heart sounds  normal, no murmurs or gallops, no peripheral edema Abdomen: soft and non-tender, no hepatosplenomegaly, BS normal. Musculoskeletal: No deformities, no cyanosis or clubbing Neuro:  alert, non focal        Assessment & Plan:

## 2017-09-10 NOTE — Patient Instructions (Signed)
We discussed progression of idiopathic pulmonary fibrosis  Start OFEV 75 twice daily and increase gradually to full dose 150 twice daily  Referral to pulmonary rehabilitation  Obtain liver function test from PCP, if not done then we will obtain baseline and then every month for 3 months

## 2017-09-15 ENCOUNTER — Telehealth: Payer: Self-pay | Admitting: Radiation Oncology

## 2017-09-15 NOTE — Telephone Encounter (Signed)
I spoke with the patient to let her know my communication with Dr. Elsworth Soho and that we will be happy to see her back at any point, but that he will continue to order her scans. She is going to begin a pulmonary rehab program this week and is hoping to qualify for financial assistance for a medication given for pulmonary fibrosis. I encouraged her to keep [place] informed of any questions or concerns regarding this.

## 2017-09-17 ENCOUNTER — Encounter (HOSPITAL_COMMUNITY)
Admission: RE | Admit: 2017-09-17 | Discharge: 2017-09-17 | Disposition: A | Discharge status: Home / Self Care | Payer: Medicare Other | Source: Ambulatory Visit | Attending: Pulmonary Disease | Admitting: Pulmonary Disease

## 2017-09-17 NOTE — Telephone Encounter (Signed)
Rescheduled orientation for 09/21/17 at 8:00am.. In epic it scheduled for 10:30am as there is not an option for an 8:00am slot.

## 2017-09-20 ENCOUNTER — Ambulatory Visit: Payer: Self-pay | Admitting: Pulmonary Disease

## 2017-09-21 ENCOUNTER — Inpatient Hospital Stay (HOSPITAL_COMMUNITY)
Admission: EM | Admit: 2017-09-21 | Discharge: 2017-09-24 | DRG: 196 | Disposition: A | Discharge status: Home / Self Care | Payer: Medicare Other | Attending: Pulmonary Disease | Admitting: Pulmonary Disease

## 2017-09-21 ENCOUNTER — Emergency Department (HOSPITAL_COMMUNITY)
Admission: RE | Admit: 2017-09-21 | Discharge: 2017-09-21 | Disposition: A | Discharge status: Home / Self Care | Payer: Medicare Other | Source: Ambulatory Visit | Attending: Pulmonary Disease | Admitting: Pulmonary Disease

## 2017-09-21 ENCOUNTER — Telehealth: Payer: Self-pay | Admitting: Pulmonary Disease

## 2017-09-21 ENCOUNTER — Encounter (HOSPITAL_COMMUNITY): Payer: Self-pay

## 2017-09-21 ENCOUNTER — Emergency Department (HOSPITAL_COMMUNITY): Payer: Medicare Other

## 2017-09-21 DIAGNOSIS — Z87891 Personal history of nicotine dependence: Secondary | ICD-10-CM | POA: Diagnosis not present

## 2017-09-21 DIAGNOSIS — Z85118 Personal history of other malignant neoplasm of bronchus and lung: Secondary | ICD-10-CM

## 2017-09-21 DIAGNOSIS — I739 Peripheral vascular disease, unspecified: Secondary | ICD-10-CM | POA: Diagnosis present

## 2017-09-21 DIAGNOSIS — J9621 Acute and chronic respiratory failure with hypoxia: Secondary | ICD-10-CM | POA: Diagnosis present

## 2017-09-21 DIAGNOSIS — N179 Acute kidney failure, unspecified: Secondary | ICD-10-CM | POA: Diagnosis present

## 2017-09-21 DIAGNOSIS — J9601 Acute respiratory failure with hypoxia: Secondary | ICD-10-CM | POA: Diagnosis not present

## 2017-09-21 DIAGNOSIS — E785 Hyperlipidemia, unspecified: Secondary | ICD-10-CM | POA: Diagnosis present

## 2017-09-21 DIAGNOSIS — R0902 Hypoxemia: Secondary | ICD-10-CM | POA: Diagnosis not present

## 2017-09-21 DIAGNOSIS — Z79899 Other long term (current) drug therapy: Secondary | ICD-10-CM

## 2017-09-21 DIAGNOSIS — J8410 Pulmonary fibrosis, unspecified: Secondary | ICD-10-CM

## 2017-09-21 DIAGNOSIS — F419 Anxiety disorder, unspecified: Secondary | ICD-10-CM | POA: Diagnosis present

## 2017-09-21 DIAGNOSIS — T380X5A Adverse effect of glucocorticoids and synthetic analogues, initial encounter: Secondary | ICD-10-CM | POA: Diagnosis not present

## 2017-09-21 DIAGNOSIS — R739 Hyperglycemia, unspecified: Secondary | ICD-10-CM | POA: Diagnosis not present

## 2017-09-21 DIAGNOSIS — Z96649 Presence of unspecified artificial hip joint: Secondary | ICD-10-CM | POA: Diagnosis present

## 2017-09-21 DIAGNOSIS — R0602 Shortness of breath: Secondary | ICD-10-CM | POA: Diagnosis not present

## 2017-09-21 DIAGNOSIS — J84112 Idiopathic pulmonary fibrosis: Principal | ICD-10-CM | POA: Diagnosis present

## 2017-09-21 DIAGNOSIS — E871 Hypo-osmolality and hyponatremia: Secondary | ICD-10-CM | POA: Diagnosis present

## 2017-09-21 DIAGNOSIS — I10 Essential (primary) hypertension: Secondary | ICD-10-CM | POA: Diagnosis present

## 2017-09-21 HISTORY — DX: Pulmonary fibrosis, unspecified: J84.10

## 2017-09-21 HISTORY — DX: Other chronic pain: G89.29

## 2017-09-21 HISTORY — DX: Headache: R51

## 2017-09-21 HISTORY — DX: Other seasonal allergic rhinitis: J30.2

## 2017-09-21 HISTORY — DX: Low back pain: M54.5

## 2017-09-21 HISTORY — DX: Depression, unspecified: F32.A

## 2017-09-21 HISTORY — DX: Other complications of anesthesia, initial encounter: T88.59XA

## 2017-09-21 HISTORY — DX: Headache, unspecified: R51.9

## 2017-09-21 HISTORY — DX: Adverse effect of unspecified anesthetic, initial encounter: T41.45XA

## 2017-09-21 HISTORY — DX: Major depressive disorder, single episode, unspecified: F32.9

## 2017-09-21 LAB — GLUCOSE, CAPILLARY: Glucose-Capillary: 155 mg/dL — ABNORMAL HIGH (ref 65–99)

## 2017-09-21 LAB — CBC WITH DIFFERENTIAL/PLATELET
Basophils Absolute: 0.1 10*3/uL (ref 0.0–0.1)
Basophils Relative: 0 %
Eosinophils Absolute: 0.2 10*3/uL (ref 0.0–0.7)
Eosinophils Relative: 2 %
HEMATOCRIT: 35.7 % — AB (ref 36.0–46.0)
Hemoglobin: 12 g/dL (ref 12.0–15.0)
LYMPHS ABS: 1.4 10*3/uL (ref 0.7–4.0)
LYMPHS PCT: 13 %
MCH: 32.2 pg (ref 26.0–34.0)
MCHC: 33.6 g/dL (ref 30.0–36.0)
MCV: 95.7 fL (ref 78.0–100.0)
MONO ABS: 1.1 10*3/uL — AB (ref 0.1–1.0)
MONOS PCT: 10 %
NEUTROS ABS: 8.4 10*3/uL — AB (ref 1.7–7.7)
Neutrophils Relative %: 75 %
Platelets: 372 10*3/uL (ref 150–400)
RBC: 3.73 MIL/uL — ABNORMAL LOW (ref 3.87–5.11)
RDW: 13.4 % (ref 11.5–15.5)
WBC: 11.1 10*3/uL — ABNORMAL HIGH (ref 4.0–10.5)

## 2017-09-21 LAB — BRAIN NATRIURETIC PEPTIDE: B NATRIURETIC PEPTIDE 5: 94.6 pg/mL (ref 0.0–100.0)

## 2017-09-21 LAB — COMPREHENSIVE METABOLIC PANEL
ALK PHOS: 81 U/L (ref 38–126)
ALT: 15 U/L (ref 14–54)
ANION GAP: 13 (ref 5–15)
AST: 25 U/L (ref 15–41)
Albumin: 3.7 g/dL (ref 3.5–5.0)
BILIRUBIN TOTAL: 1.1 mg/dL (ref 0.3–1.2)
BUN: 25 mg/dL — ABNORMAL HIGH (ref 6–20)
CALCIUM: 9.8 mg/dL (ref 8.9–10.3)
CO2: 25 mmol/L (ref 22–32)
Chloride: 98 mmol/L — ABNORMAL LOW (ref 101–111)
Creatinine, Ser: 1.14 mg/dL — ABNORMAL HIGH (ref 0.44–1.00)
GFR calc Af Amer: 54 mL/min — ABNORMAL LOW (ref >60)
GFR, EST NON AFRICAN AMERICAN: 46 mL/min — AB (ref >60)
Glucose, Bld: 116 mg/dL — ABNORMAL HIGH (ref 65–99)
POTASSIUM: 4.2 mmol/L (ref 3.5–5.1)
Sodium: 136 mmol/L (ref 135–145)
Total Protein: 7.4 g/dL (ref 6.5–8.1)

## 2017-09-21 LAB — CBG MONITORING, ED: GLUCOSE-CAPILLARY: 100 mg/dL — AB (ref 65–99)

## 2017-09-21 LAB — PROCALCITONIN: PROCALCITONIN: 0.19 ng/mL

## 2017-09-21 LAB — I-STAT TROPONIN, ED: TROPONIN I, POC: 0 ng/mL (ref 0.00–0.08)

## 2017-09-21 MED ORDER — POLYVINYL ALCOHOL 1.4 % OP SOLN
2.0000 [drp] | Freq: Every day | OPHTHALMIC | Status: DC
  Administered 2017-09-22: 2 [drp] via OPHTHALMIC
  Filled 2017-09-21: qty 15

## 2017-09-21 MED ORDER — METHYLPREDNISOLONE SODIUM SUCC 40 MG IJ SOLR
40.0000 mg | Freq: Two times a day (BID) | INTRAMUSCULAR | Status: DC
  Administered 2017-09-21 – 2017-09-22 (×2): 40 mg via INTRAVENOUS
  Filled 2017-09-21 (×2): qty 1

## 2017-09-21 MED ORDER — INSULIN ASPART 100 UNIT/ML ~~LOC~~ SOLN
0.0000 [IU] | Freq: Three times a day (TID) | SUBCUTANEOUS | Status: DC
  Administered 2017-09-22: 2 [IU] via SUBCUTANEOUS
  Administered 2017-09-23 (×3): 1 [IU] via SUBCUTANEOUS

## 2017-09-21 MED ORDER — HEPARIN SODIUM (PORCINE) 5000 UNIT/ML IJ SOLN
5000.0000 [IU] | Freq: Three times a day (TID) | INTRAMUSCULAR | Status: DC
  Administered 2017-09-21 – 2017-09-23 (×4): 5000 [IU] via SUBCUTANEOUS
  Filled 2017-09-21 (×4): qty 1

## 2017-09-21 MED ORDER — LORATADINE 10 MG PO TABS
10.0000 mg | ORAL_TABLET | Freq: Every day | ORAL | Status: DC
  Administered 2017-09-23 – 2017-09-24 (×2): 10 mg via ORAL
  Filled 2017-09-21 (×3): qty 1

## 2017-09-21 MED ORDER — ATORVASTATIN CALCIUM 20 MG PO TABS
20.0000 mg | ORAL_TABLET | Freq: Every morning | ORAL | Status: DC
  Administered 2017-09-22 – 2017-09-24 (×3): 20 mg via ORAL
  Filled 2017-09-21 (×3): qty 1

## 2017-09-21 MED ORDER — AMLODIPINE BESYLATE 10 MG PO TABS
10.0000 mg | ORAL_TABLET | Freq: Every day | ORAL | Status: DC
  Administered 2017-09-22 – 2017-09-24 (×3): 10 mg via ORAL
  Filled 2017-09-21 (×3): qty 1

## 2017-09-21 NOTE — ED Notes (Signed)
Attempted report 

## 2017-09-21 NOTE — ED Triage Notes (Signed)
Patient from pulmonary rehab for low sats. Oxygen at high 70-80s with ambulation. Patient denies pain, alert and oriented, patient on 2L on arrival and sats 98%. NAD. Patient has lung cancer and pulmonary fibrosis

## 2017-09-21 NOTE — ED Provider Notes (Signed)
[facility] EMERGENCY DEPARTMENT Provider Note   CSN: [id number] Arrival date & time: 09/21/17  0175     History   Chief Complaint Chief Complaint  Patient presents with  . low sats/ pulmonary rehab    HPI [name] [name] is a 76 y.o. female.  HPI Patient has history of pulmonary fibrosis.  She has been having declining energy level and increasing shortness of breath for several weeks.  Patient was referred to pulmonary rehab and when there today was noted to have oxygen desaturation down to 70s with ambulation.  On 2 L nasal cannula patient rebounded to about 98%.  Patient denies she has had fever.  She does not feel that she has had any increased cough over mild baseline no lower extremity swelling or calf pain.  Since on oxygen, patient reports she feels much better.  She reports that she knew is her pulmonary fibrosis worsened, she would become more short of breath but she was not aware of the amount of general fatigue and lack of energy she would experience.  At baseline, patient has not been on supplemental oxygen. Past Medical History:  Diagnosis Date  . Allergy   . Anxiety   . Arthritis    a. 10/2012 s/p Right THA.  Marland Kitchen History of ARDS   . Hyperlipidemia   . Hypertension   . Lung cancer ([place]) 02/16/13   LUL lung SQUAMOUS CELL  . Lung cancer ([place]) dx'd 12/2013    LLL ADENOCARCINOMA  . Pulmonary fibrosis ([place])   . Pulmonary nodule    a. 10/2012 CT: 58mm anterior LUL nodule w/ spiculated appearance - rec PET CT.    Patient Active Problem List   Diagnosis Date Noted  . Mediastinal lymphadenopathy 06/29/2017  . 24.8 mm well differentiated adenocarcinoma of the left lower lobe of the lung - clinical stage IA 12/28/2013  . 18 mm squamous cell carcinoma of the left upper lobe of the lung - clinical stage IA 02/16/2013  . Pedal edema 01/02/2013  . Hoarseness, persistent 01/02/2013  . Hypertension 12/02/2012  . Pulmonary nodule 12/02/2012  . IPF  (idiopathic pulmonary fibrosis) ([place]) 12/01/2012  . Normocytic anemia 11/07/2012  . S/P total hip arthroplasty 11/06/2012  . Degenerative arthritis of hip 11/03/2012    Past Surgical History:  Procedure Laterality Date  . LUNG BIOPSY Left 02/16/13   LUL =non small cell ca with assoc/necrosis  . TONSILLECTOMY    . TOTAL HIP ARTHROPLASTY  11/03/2012   Procedure: TOTAL HIP ARTHROPLASTY ANTERIOR APPROACH;  Surgeon: Mcarthur Rossetti, MD;  Location: [facility] ORS;  Service: Orthopedics;  Laterality: Right;  Right Total Hip Arthroplasty, Anterior Approach (C-Arm)  . TUBAL LIGATION      OB History    No data available       Home Medications    Prior to Admission medications   Medication Sig Start Date End Date Taking? Authorizing Provider  acetaminophen (TYLENOL) 500 MG tablet Take 1,000 mg by mouth every 6 (six) hours as needed for pain.   Yes [provider]  amLODipine (NORVASC) 10 MG tablet Take 1 tablet (10 mg total) by mouth daily. 06/29/17  Yes Rigoberto Noel, MD  atorvastatin (LIPITOR) 40 MG tablet Take 20 mg by mouth every morning.   Yes [provider]  hydrochlorothiazide (HYDRODIURIL) 25 MG tablet Take 25 mg by mouth daily before breakfast.    Yes [provider]  Hypromellose (ARTIFICIAL TEARS OP) Place 1 drop into both eyes daily.  Yes [provider]  ibuprofen (ADVIL,MOTRIN) 200 MG tablet Take 400 mg by mouth every 6 (six) hours as needed for pain.   Yes [provider]  loratadine (CLARITIN) 10 MG tablet Take 10 mg by mouth daily.   Yes [provider]  losartan (COZAAR) 100 MG tablet Take 1 tablet (100 mg total) by mouth daily. 06/29/17  Yes Rigoberto Noel, MD    Family History Family History  Problem Relation Age of Onset  . CAD Father     Social History Social History  Substance Use Topics  . Smoking status: Former Smoker    Packs/day: 1.00    Years: 40.00    Types: Cigarettes    Quit date: 11/03/2012  .  Smokeless tobacco: Never Used  . Alcohol use Yes     Comment: 2-3 drinks of liquor nightly     Allergies   Penicillins   Review of Systems Review of Systems 10 Systems reviewed and are negative for acute change except as noted in the HPI.   Physical Exam Updated Vital Signs BP 98/63   Pulse 91   Temp 98 F (36.7 C) (Oral)   Resp (!) 24   SpO2 93%   Physical Exam  Constitutional: She is oriented to person, place, and time. She appears well-developed and well-nourished. No distress.  HENT:  Head: Normocephalic and atraumatic.  Mouth/Throat: Oropharynx is clear and moist.  Eyes: Conjunctivae and EOM are normal.  Neck: Neck supple.  Cardiovascular: Normal rate and regular rhythm.   No murmur heard. Pulmonary/Chest: Effort normal.  Diffuse crackles of bases and midlung fields.  Abdominal: Soft. There is no tenderness.  Musculoskeletal: Normal range of motion. She exhibits no edema or tenderness.  Neurological: She is alert and oriented to person, place, and time. No cranial nerve deficit. She exhibits normal muscle tone. Coordination normal.  Skin: Skin is warm and dry.  Psychiatric: She has a normal mood and affect.  Nursing note and vitals reviewed.    ED Treatments / Results  Labs (all labs ordered are listed, but only abnormal results are displayed) Labs Reviewed  CBC WITH DIFFERENTIAL/PLATELET - Abnormal; Notable for the following:       Result Value   WBC 11.1 (*)    RBC 3.73 (*)    HCT 35.7 (*)    Neutro Abs 8.4 (*)    Monocytes Absolute 1.1 (*)    All other components within normal limits  COMPREHENSIVE METABOLIC PANEL - Abnormal; Notable for the following:    Chloride 98 (*)    Glucose, Bld 116 (*)    BUN 25 (*)    Creatinine, Ser 1.14 (*)    GFR calc non Af Amer 46 (*)    GFR calc Af Amer 54 (*)    All other components within normal limits  BRAIN NATRIURETIC PEPTIDE  I-STAT TROPONIN, ED    EKG  EKG Interpretation  Date/Time:  Tuesday September 21 2017 09:21:41 EDT Ventricular Rate:  102 PR Interval:  130 QRS Duration: 86 QT Interval:  396 QTC Calculation: 516 R Axis:   16 Text Interpretation:  Sinus tachycardia Possible Left atrial enlargement Nonspecific T wave abnormality Abnormal ECG no acute intercval change Confirmed by Charlesetta Shanks [phone]) on 09/21/2017 12:40:53 PM       Radiology Dg Chest 2 View  Result Date: 09/21/2017 CLINICAL DATA:  Shortness of breath. EXAM: CHEST  2 VIEW COMPARISON:  CT scan of August 31, 2017. Radiograph of January 04, 2014.  FINDINGS: Stable cardiomegaly. Atherosclerosis of thoracic aorta is noted. No pneumothorax or pleural effusion is noted. Stable reticular densities are noted throughout both lungs consistent with pulmonary fibrosis. Interval development of linear opacity seen in left midlung concerning for subsegmental atelectasis or scarring. Probable old mildly displaced left fifth rib fracture is noted laterally. IMPRESSION: Aortic atherosclerosis. Stable reticular densities are noted throughout both lungs most consistent with pulmonary fibrosis. Interval development of linear subsegmental atelectasis or scarring seen in left midlung. Electronically Signed   By: Marijo Conception, M.D.   On: 09/21/2017 09:52    Procedures Procedures (including critical care time)  Medications Ordered in ED Medications - No data to display   Initial Impression / Assessment and Plan / ED Course  I have reviewed the triage vital signs and the nursing notes.  Pertinent labs & imaging results that were available during my care of the patient were reviewed by me and considered in my medical decision making (see chart for details).     Consult: Dr. Titus Mould to see the patient in the emergency department.  Plan is to admit.  Final Clinical Impressions(s) / ED Diagnoses   Final diagnoses:  Hypoxia  Pulmonary fibrosis ([place])   Patient has known pulmonary fibrosis but has increasing hypoxia.  Patient  improved on supplemental oxygen.  Plan will be for admission as per consultation with pulmonology. New Prescriptions New Prescriptions   No medications on file     Charlesetta Shanks, MD 09/21/17 1547

## 2017-09-21 NOTE — Progress Notes (Signed)
Incomplete Orientation Session Note  Patient Details  Name: [name] [name] MRN: [id number] Date of Birth: Mar 01, 1941 Referring Provider:    [name] did not complete her rehab session.  [name] arrived to her pulmonary rehab orientation appointment as scheduled. Upon initial assessment it was noted that her oxygen saturation was 74% on RA with pleth correlation to her HR. Second pulse oximeter was placed on her left hand with simular findings of a saturation of 73%. Forehead probe placed and Sao2 noted at 74%. Patient was in no respiratory distress but complained of bilat leg pain 10/10 and general malaise. Medical Director notified and arrived to assess patient. Patient has not been prescribed oxygen for home or portable use. She was placed on 2 liters of O2 with sats recovering to 98%. A six min walk test was attempted with medical director by patients side. She was able to walk 400 ft on 3 liters before saturations dropped to 83. Walk test was aborted and Medical director recommended patient be evaluated in ED as this is a change in status since last pulmonology office visit. Patient states she has felt "horrible" over the last 3 weeks. Spoke with patient son per patients request who initially requested patient NOT be taken to the ED. He stated "every time mom goes to the hospital she comes out worse". Explained to son that patient wishes to be admitted and she is under direct order of Market researcher. Also explained to son that we could not hold patient in out patient department with saturations in the low 70s. He felt patient would be better served with office visit to [facility]. Patient continued to request ED admission. Taken to triage, report given.

## 2017-09-21 NOTE — Telephone Encounter (Signed)
Spoke with Hilda Blades with [employer]. She wanted to go over the PA for patient's OFEV. Answered questions over the phone. Ref # is [id number] She stated that it normally takes around 24-72 hours for it to be processed.

## 2017-09-21 NOTE — Consult Note (Signed)
Name: [name] [name] MRN: [id number] DOB: May 11, 1941    ADMISSION DATE:  09/21/2017 CONSULTATION DATE:  09/21/2017  REFERRING MD :  Dr. Johnney Killian EDP  CHIEF COMPLAINT:  Hypoxia  HISTORY OF PRESENT ILLNESS:  76 year old female, former smoker, with PMH as below, which is significant for IPF recently started on OFEV and Stage IA left sided lung Ca s/o SBRT. She was seen in the pulmonary clinic by Dr. Elsworth Soho 10/12 for routine follow up with complaints of SOB with activity not seeming out of the ordinary. She was started on OFEV and referred for pulmonary rehab. 10/23 she presented for her pulmonary rehab consultation and was noted to be markedly hypoxic with O2 sats in the 70s-80s. She was sent to ED for further eval, who have consulted pulmonary for further eval.   SIGNIFICANT EVENTS  2013 ARDS  2014 Lung Ca, SBRT 2015 ILD found on surveillance CT  STUDIES:  PET 05/2017 subcarinal LN SUV 4.2 PFTs 06/2017 show moderate restriction with ratio 93, FVC 56%, FEV1 70%, TLC 44% with DLCO 43%  PAST MEDICAL HISTORY :   has a past medical history of Allergy; Anxiety; Arthritis; History of ARDS; Hyperlipidemia; Hypertension; Lung cancer ([place]) (02/16/13); Lung cancer ([place]) (dx'd 12/2013); Pulmonary fibrosis ([place]); and Pulmonary nodule.  has a past surgical history that includes Tonsillectomy; Total hip arthroplasty (11/03/2012); Tubal ligation; and Lung biopsy (Left, 02/16/13). Prior to Admission medications   Medication Sig Start Date End Date Taking? Authorizing Provider  acetaminophen (TYLENOL) 500 MG tablet Take 1,000 mg by mouth every 6 (six) hours as needed for pain.   Yes [provider]  amLODipine (NORVASC) 10 MG tablet Take 1 tablet (10 mg total) by mouth daily. 06/29/17  Yes Rigoberto Noel, MD  atorvastatin (LIPITOR) 40 MG tablet Take 20 mg by mouth every morning.   Yes [provider]  hydrochlorothiazide (HYDRODIURIL) 25 MG tablet Take 25 mg by mouth daily before breakfast.     Yes [provider]  Hypromellose (ARTIFICIAL TEARS OP) Place 1 drop into both eyes daily.   Yes [provider]  ibuprofen (ADVIL,MOTRIN) 200 MG tablet Take 400 mg by mouth every 6 (six) hours as needed for pain.   Yes [provider]  loratadine (CLARITIN) 10 MG tablet Take 10 mg by mouth daily.   Yes [provider]  losartan (COZAAR) 100 MG tablet Take 1 tablet (100 mg total) by mouth daily. 06/29/17  Yes Rigoberto Noel, MD   Allergies  Allergen Reactions  . Penicillins Hives and Swelling    Has patient had a PCN reaction causing immediate rash, facial/tongue/throat swelling, SOB or lightheadedness with hypotension: Yes Has patient had a PCN reaction causing severe rash involving mucus membranes or skin necrosis: No Has patient had a PCN reaction that required hospitalization: No Has patient had a PCN reaction occurring within the last 10 years: No If all of the above answers are "NO", then may proceed with Cephalosporin use.     FAMILY HISTORY:  family history includes CAD in her father. SOCIAL HISTORY:  reports that she quit smoking about 4 years ago. Her smoking use included Cigarettes. She has a 40.00 pack-year smoking history. She has never used smokeless tobacco. She reports that she drinks alcohol. She reports that she does not use drugs.  REVIEW OF SYSTEMS:   Bolds are positive  Constitutional: weight loss, gain, night sweats, Fevers, chills, fatigue .  HEENT: headaches, Sore throat, sneezing, nasal congestion, post nasal drip, Difficulty  swallowing, Tooth/dental problems, visual complaints visual changes, ear ache CV:  chest pain, radiates:,Orthopnea, PND, swelling in lower extremities, dizziness, palpitations, syncope.  GI  heartburn, indigestion, abdominal pain, nausea, vomiting, diarrhea, change in bowel habits, loss of appetite, bloody stools.  Resp: cough, productive: , hemoptysis, dyspnea worse on exertion, chest pain, pleuritic.    Skin: rash or itching or icterus GU: dysuria, change in color of urine, urgency or frequency. flank pain, hematuria  MS: joint pain or swelling. decreased range of motion  Psych: change in mood or affect. depression or anxiety.  Neuro: difficulty with speech, weakness, numbness, ataxia   SUBJECTIVE:   VITAL SIGNS: Temp:  [98 F (36.7 C)] 98 F (36.7 C) (10/23 0918) Pulse Rate:  [86-102] 91 (10/23 1400) Resp:  [18-33] 24 (10/23 1400) BP: (98-137)/(58-80) 98/63 (10/23 1400) SpO2:  [92 %-98 %] 93 % (10/23 1400)  PHYSICAL EXAMINATION: General:  Awake, alert, nonfocal Neuro:  Perrl, good strength now on O2 HEENT:  jvd wnl, no stridor Cardiovascular: s1 s2 RRR split 2s Lungs:  Coarse dry crackles Abdomen:  Soft, BS wnl, no r Musculoskeletal:  normal Skin:  No rash   Recent Labs Lab 09/21/17 0925  NA 136  K 4.2  CL 98*  CO2 25  BUN 25*  CREATININE 1.14*  GLUCOSE 116*    Recent Labs Lab 09/21/17 0925  HGB 12.0  HCT 35.7*  WBC 11.1*  PLT 372   Dg Chest 2 View  Result Date: 09/21/2017 CLINICAL DATA:  Shortness of breath. EXAM: CHEST  2 VIEW COMPARISON:  CT scan of August 31, 2017. Radiograph of January 04, 2014. FINDINGS: Stable cardiomegaly. Atherosclerosis of thoracic aorta is noted. No pneumothorax or pleural effusion is noted. Stable reticular densities are noted throughout both lungs consistent with pulmonary fibrosis. Interval development of linear opacity seen in left midlung concerning for subsegmental atelectasis or scarring. Probable old mildly displaced left fifth rib fracture is noted laterally. IMPRESSION: Aortic atherosclerosis. Stable reticular densities are noted throughout both lungs most consistent with pulmonary fibrosis. Interval development of linear subsegmental atelectasis or scarring seen in left midlung. Electronically Signed   By: Marijo Conception, M.D.   On: 09/21/2017 09:52    ASSESSMENT / PLAN:  IPF / ILD; probable UIP (followed by Dr.  Elsworth Soho) - recently started on OFEV 09/10/17 as well as referred to pulmonary rehab. Likely worsening IPF now with hypoxia worsening Rule out flare H/o radiation pneumonitis Plan: Continue OFEV. Start systemic steroids solumedrol 40 q12h trial PRN albuterol Mobilize as able pcxr follow up in am  Not suspicious infection likely will need HOme O2 if not responsive to steroids Dc ibuprofen,  Can rarely cause pneumonitis Even balance goals I reviewed all past CT and last oct 2, her symptoms do not match PE, hold off CT repeat, especially with crt rise noted  Acute on chronic hypoxic respiratory failure Plan: Continue supplemental O2 as needed to maintain SpO2 > 90%. Incentive spirometry Will need to assess HOME O2 needs early, likely will require  Hx stage Ia lung cancer -S/P SBRT (LUL 03/08/13, LLL03/02/15) Surveillance CT chest 05/24/17 showed new 4 mm nodule:  Plan: Needs follow-up CT chest in 6 months.  AKI likely pre-renal Hydrate Repeat BMP in AM  Leukocytosis/SIRS no infiltrate on CXR hold ABX for now Assess PCT Trend WBC   09/21/2017 3:49 PM  I updated pt and daughter in room in full  Lavon Paganini. Titus Mould, MD, [place] Pgr: [facility] 09/21/2017 4:34  PM

## 2017-09-21 NOTE — ED Notes (Signed)
Pt taken to restroom via wheelchair.

## 2017-09-21 NOTE — Progress Notes (Signed)
Lavene Penagos is a 76 y.o. female patient admitted from ED awake, alert - oriented  X 4 - no acute distress noted.  VSS - Blood pressure 136/73, pulse 89, temperature 98 F (36.7 C), temperature source Oral, resp. rate (!) 29, SpO2 92 %.    IV in place, occlusive dsg intact without redness.  Orientation to room, and floor completed with information packet given to patient/family.  Patient declined safety video at this time.  Admission INP armband ID verified with patient/family, and in place.   SR up x 2, fall assessment complete, with patient and family able to verbalize understanding of risk associated with falls, and verbalized understanding to call nsg before up out of bed.  Call light within reach, patient able to voice, and demonstrate understanding.  Skin, clean-dry- intact without evidence of bruising, or skin tears.   No evidence of skin break down noted on exam.     Will cont to eval and treat per MD orders.  Milas Hock, RN 09/21/2017 6:42 PM

## 2017-09-21 NOTE — Progress Notes (Signed)
Asked to come evaluate patient for hypoxemia on her initial orientation for pulmonary rehab.  Patient was seen on 10/12 and evidently saturation was not an issue in the pulmonary office.  Today she is saturating in the mid 70's on RA.  With exercise she desaturated to 83% on 3L [place].  Obviously there is a change.  Recommend sending patient to the ED for CXR and assessing hypoxemia in an IPF patient.  Patient going to the ED.  Rush Farmer, M.D. [facility]Critical [facility] Medicine. Pager: [phone].

## 2017-09-22 ENCOUNTER — Inpatient Hospital Stay (HOSPITAL_COMMUNITY): Payer: Medicare Other

## 2017-09-22 ENCOUNTER — Encounter (HOSPITAL_COMMUNITY): Payer: Self-pay | Admitting: Pulmonary Disease

## 2017-09-22 DIAGNOSIS — J9601 Acute respiratory failure with hypoxia: Secondary | ICD-10-CM

## 2017-09-22 LAB — BASIC METABOLIC PANEL
Anion gap: 13 (ref 5–15)
BUN: 25 mg/dL — AB (ref 6–20)
CO2: 26 mmol/L (ref 22–32)
CREATININE: 0.96 mg/dL (ref 0.44–1.00)
Calcium: 9.6 mg/dL (ref 8.9–10.3)
Chloride: 95 mmol/L — ABNORMAL LOW (ref 101–111)
GFR calc Af Amer: >60 mL/min (ref >60)
GFR, EST NON AFRICAN AMERICAN: 57 mL/min — AB (ref >60)
GLUCOSE: 158 mg/dL — AB (ref 65–99)
Potassium: 4.1 mmol/L (ref 3.5–5.1)
SODIUM: 134 mmol/L — AB (ref 135–145)

## 2017-09-22 LAB — CBC
HCT: 34.1 % — ABNORMAL LOW (ref 36.0–46.0)
Hemoglobin: 11.1 g/dL — ABNORMAL LOW (ref 12.0–15.0)
MCH: 31.4 pg (ref 26.0–34.0)
MCHC: 32.6 g/dL (ref 30.0–36.0)
MCV: 96.6 fL (ref 78.0–100.0)
PLATELETS: 362 10*3/uL (ref 150–400)
RBC: 3.53 MIL/uL — ABNORMAL LOW (ref 3.87–5.11)
RDW: 13.4 % (ref 11.5–15.5)
WBC: 8 10*3/uL (ref 4.0–10.5)

## 2017-09-22 LAB — PHOSPHORUS: Phosphorus: 3.7 mg/dL (ref 2.5–4.6)

## 2017-09-22 LAB — GLUCOSE, CAPILLARY
GLUCOSE-CAPILLARY: 163 mg/dL — AB (ref 65–99)
Glucose-Capillary: 148 mg/dL — ABNORMAL HIGH (ref 65–99)

## 2017-09-22 LAB — MAGNESIUM: MAGNESIUM: 1.6 mg/dL — AB (ref 1.7–2.4)

## 2017-09-22 MED ORDER — HYDROCHLOROTHIAZIDE 25 MG PO TABS
25.0000 mg | ORAL_TABLET | Freq: Every day | ORAL | Status: DC
  Administered 2017-09-22 – 2017-09-24 (×3): 25 mg via ORAL
  Filled 2017-09-22 (×3): qty 1

## 2017-09-22 MED ORDER — MAGNESIUM SULFATE 2 GM/50ML IV SOLN
2.0000 g | Freq: Once | INTRAVENOUS | Status: AC
  Administered 2017-09-22: 2 g via INTRAVENOUS
  Filled 2017-09-22: qty 50

## 2017-09-22 MED ORDER — ORAL CARE MOUTH RINSE
15.0000 mL | Freq: Two times a day (BID) | OROMUCOSAL | Status: DC
  Administered 2017-09-22 – 2017-09-23 (×4): 15 mL via OROMUCOSAL

## 2017-09-22 MED ORDER — PREDNISONE 20 MG PO TABS
60.0000 mg | ORAL_TABLET | Freq: Every day | ORAL | Status: DC
  Administered 2017-09-23 – 2017-09-24 (×2): 60 mg via ORAL
  Filled 2017-09-22 (×2): qty 3

## 2017-09-22 NOTE — Plan of Care (Signed)
Problem: Education: Goal: Knowledge of [place] Education information/materials will improve Outcome: Progressing POC and orders reviewed with pt.

## 2017-09-22 NOTE — Progress Notes (Signed)
[facility] Attending Note  ADMISSION DATE:  09/21/2017  REFERRING MD :  Dr. Johnney Killian EDP  CHIEF COMPLAINT:  Hypoxia  Presenting HPI:  76 y.o. female former smoker with past medical history significant for IPF recently started on Ofev after office visit on 10/12. Patient also diagnosed with stage IA left-sided lung cancer status post SBRT completed 01/2014. Patient has been participating in pulmonary rehabilitation. Noted to be markedly hypoxic on 10/23 which prompted patient to be evaluated in the emergency department. Reports she experienced a period of increased dyspnea over the summer which seemed to spontaneously resolve and improve. She underwent treatment with 2 courses of antibiotics.  Subjective:  Continues to have nonproductive cough. Dyspnea with exertion unchanged. Reports she did previously have some brief chest heaviness at home once upon awakening. No chest pain, pressure, or tightness here. She also previously had some diarrhea after eating at a local restaurant but this was only one event. No other abdominal pain or nausea.  Review of Systems:  Denies any subjective fever, chills, or sweats. No sore throat or sinus congestion. Patient reports her previous leg cramping and pain have completely resolved on oxygen therapy.  Temp:  [97.6 F (36.4 C)-98.4 F (36.9 C)] 97.6 F (36.4 C) (10/24 0645) Pulse Rate:  [88-95] 91 (10/24 0645) Resp:  [20-33] 20 (10/24 0645) BP: (98-136)/(58-80) 136/71 (10/24 0645) SpO2:  [91 %-96 %] 95 % (10/24 0645) Weight:  [130 lb (59 kg)-131 lb 3.2 oz (59.5 kg)] 130 lb (59 kg) (10/24 0645)   General:  Awake. No distress. Alert. Caucasian female. Integument:  Warm & dry. No rash or bruising on exposed skin. HEENT:  No scleral icterus or injection. Pupils symmetric.  Pulmonary:  Mild bilateral basilar crackles. Normal work of breathing on nasal cannula.  Cardiovascular:  Regular rate. No JVD apprecaited. No edema. Abdomen:   Soft. Nontender. Normal bowel sounds. Neurological:  Cranial nerves grossly in tact. No meningismus. Moving all 4 extremities equally. Oriented x4.   CBC Latest Ref Rng & Units 09/22/2017 09/21/2017 01/04/2014  WBC 4.0 - 10.5 K/uL 8.0 11.1(H) 6.1  Hemoglobin 12.0 - 15.0 g/dL 11.1(L) 12.0 13.5  Hematocrit 36.0 - 46.0 % 34.1(L) 35.7(L) 38.8  Platelets 150 - 400 K/uL 362 372 223    BMP Latest Ref Rng & Units 09/22/2017 09/21/2017 08/25/2017  Glucose 65 - 99 mg/dL 158(H) 116(H) 110(H)  BUN 6 - 20 mg/dL 25(H) 25(H) 17  Creatinine 0.44 - 1.00 mg/dL 0.96 1.14(H) 0.84  Sodium 135 - 145 mmol/L 134(L) 136 137  Potassium 3.5 - 5.1 mmol/L 4.1 4.2 4.5  Chloride 101 - 111 mmol/L 95(L) 98(L) 99  CO2 22 - 32 mmol/L 26 25 29   Calcium 8.9 - 10.3 mg/dL 9.6 9.8 10.3    IMAGING/STUDIES: PET CT 06/23/17: IMPRESSION: 1. The more posterior of the 2 subcarinal lymph nodes is not pathologically enlarged but is faintly hypermetabolic. At this level of metabolism this could be reactive or due to low-grade neoplasm. In the lungs there some consolidation on the left with low-grade associated activity probably from radiation fibrosis or radiation pneumonitis, superimposed on background chronic interstitial lung disease and emphysema. Please note that the several tiny pulmonary nodules seen on recent chest CT are too small to characterize by PET-CT as they are below sensitive PET-CT size thresholds. 2. Striking atherosclerosis. This includes the coronary arteries. Aortic Atherosclerosis (ICD10-I70.0) and Emphysema (ICD10-J43.9). 3. Multiple left renal cysts including a approximately 1 cm suspected complex cyst. 2 mm left  kidney lower pole nonobstructive renal calculus. 4. Cholelithiasis. 5. Mild cardiomegaly. CT CHEST W/ CONTRAST 08/31/17: IMPRESSION: 1. Stable radiation fibrosis in the left lung. No evidence of local tumor recurrence in the left lung. 2. No findings suspicious for metastatic disease in the chest. No  pathologically enlarged thoracic lymph nodes. Previously described small solid upper right pulmonary nodules are stable to decreased in size. 3. New patchy ground-glass centrilobular nodularity in the upper lungs, most compatible with a nonspecific infectious or inflammatory pneumonitis. 4. Underlying fibrotic interstitial lung disease with mild honeycombing, unchanged since 06/09/2017, clearly progressed since more remote chest CT of 12/13/2013, worrisome for usual interstitial pneumonia (UIP). 5. Stable trace pericardial effusion/thickening. 6. Three-vessel coronary atherosclerosis. 7. Cholelithiasis. PORT CXR 10/24:  Personally reviewed by me. Left mid lung opacity similar to previous CT scan noted above. No new focal opacity appreciated. Low lung volumes. Bilateral increased interstitial markings consistent with underlying interstitial lung disease. No new pleural effusion appreciated.  MICROBIOLOGY: None.  ANTIBIOTICS: None.   ASSESSMENT/PLAN:  76 y.o. female presenting with acute hypoxic respiratory failure and acute renal failure that seems to have essentially resolved. Patient's hypoxia is likely due to ILD flare. Suspect her episode of increased dyspnea over the summer was also a flare of her underlying ILD. Doubt her current clinical acute worsening is secondary to her remote radiation therapy. Lengthy discussion with the patient about home health care needs as she currently does live in a home with multiple floors and steps to get to her bedroom and full bath.  1. Acute hypoxic respiratory failure: Likely secondary to ILD/IPF flare. Continuing treatment as follows for IPF flare. Consulting physical therapy for ambulation and ambulatory oxygen needs. Also consulting care management for home oxygen. 2. Probable IPF flare: Switching from Solu-Medrol to prednisone 60 mg daily starting tomorrow. Continuing with plan to start Ofev as outpatient. 3. Acute renal failure: Mild. Essentially  resolved. Continuing to hold NSAIDs. Continuing monitoring of electrolytes and renal function as I restart patient's home diuretic. 4. Hypomagnesemia: Ordering magnesium sulfate 2 g IV. Repeat magnesium level with a.m. Labs. 5. Hyperglycemia, likely secondary to steroids. Continuing to monitor glucose with Accu-Cheks every before meals & at bedtime. Continuing sliding scale insulin per sensitive algorithm. 6. Essential hypertension: Monitoring vital signs per unit protocol. Holding home losartan. Continuing Norvasc. Restarting home hydrochlorothiazide 25 mg by mouth daily. 7. Hyponatremia: Mild. Monitoring electrolytes daily. 8. Lower extremity pain/leg cramping: Suspect peripheral vascular/arterial disease. Continuing supplemental oxygen. Plan for outpatient evaluation. 9. History of lung cancer with new nodule: Planning for outpatient follow-up as per her primary pulmonologist.  Prophylaxis: SCDs & heparin subcutaneous every 8 hours. Diet: Regular diet. Code Status: Full code per previous physician discussions. Disposition:  Plan for discharge likely on Friday with home oxygen. Consulting PT & Care Management.  Family Update:  Patient updated at the time of my exam.  I have spent a total of 38 minutes of time today caring for the patient, reviewing the patient's electronic medical record, and with more than 50% of that time spent coordinating care with the patient as well as reviewing the continuing plan of care with the  patient at bedside.  Sonia Baller Ashok Cordia, M.D. [facility] Pager:  [phone] After 7pm or if no response, call [phone] 11:26 AM 09/22/17

## 2017-09-22 NOTE — Evaluation (Signed)
Physical Therapy Evaluation Patient Details Name: [name] [name] MRN: [id number] DOB: September 08, 1941 Today's Date: 09/22/2017   History of Present Illness  Patient is a 76 y/o female who presents from pulmonary rehab with hypoxia. PMH includes stage IA left-sided lung cancer status post SBRT completed 01/2014, IPF, HTN, HLD, depression.  Clinical Impression  Patient presents with generalized weakness, deconditioning and dyspnea on exertion s/p above. Tolerated gait training with min guard assist for safety and Sp02 dropped to 85% on 3L/min 02 when talking. However, when pt performing pursed lip breathing during ambulation, Sp02 remained >90%. Education re: energy conservation techniques, importance of mobility, pulse ox etc. Pt independent PTA and lives alone with lots of stairs. Will follow acutely to maximize independence and mobility prior to return home. Will work on weaning 02 as needed.     Follow Up Recommendations Other (comment);Supervision - Intermittent (pulmonary rehab outpatient)    Equipment Recommendations  Other (comment) (rollator)    Recommendations for Other Services       Precautions / Restrictions Precautions Precautions: None Precaution Comments: watch 02 Restrictions Weight Bearing Restrictions: No      Mobility  Bed Mobility Overal bed mobility: Modified Independent             General bed mobility comments: HOB elevated, no assist needed.   Transfers Overall transfer level: Needs assistance Equipment used: None Transfers: Sit to/from Stand Sit to Stand: Min guard         General transfer comment: Min guard for safety.   Ambulation/Gait Ambulation/Gait assistance: Min guard Ambulation Distance (Feet): 75 Feet Assistive device:  (IV pole) Gait Pattern/deviations: Step-through pattern;Decreased stride length Gait velocity: decreased Gait velocity interpretation: <1.8 ft/sec, indicative of risk for recurrent falls General Gait Details: SLow,  guarded gait holding onto IV pole. Sp02 dropped to 85% when pt talking; when pt pursed lip breathing, Sp02 remained >90% on 3L/min 02. 1 standing rest break,  Stairs            Wheelchair Mobility    Modified Rankin (Stroke Patients Only)       Balance Overall balance assessment: Needs assistance Sitting-balance support: Feet supported;No upper extremity supported Sitting balance-Leahy Scale: Good     Standing balance support: During functional activity Standing balance-Leahy Scale: Fair Standing balance comment: Able to stand statically without UE support.                             Pertinent Vitals/Pain Pain Assessment: No/denies pain    Home Living Family/patient expects to be discharged to:: Private residence Living Arrangements: Alone Available Help at Discharge: Family;Available PRN/intermittently Type of Home: House Home Access: Stairs to enter Entrance Stairs-Rails: None Entrance Stairs-Number of Steps: 2 Home Layout: Two level;1/2 bath on main level;Bed/bath upstairs Home Equipment: Grab bars - tub/shower;Grab bars - toilet;Cane - single point      Prior Function Level of Independence: Independent with assistive device(s)         Comments: uses SPC for community ambulation but independent for household. Drives. Loves to shop.     Hand Dominance   Dominant Hand: Right    Extremity/Trunk Assessment   Upper Extremity Assessment Upper Extremity Assessment: Defer to OT evaluation    Lower Extremity Assessment Lower Extremity Assessment: Generalized weakness       Communication   Communication: No difficulties  Cognition Arousal/Alertness: Awake/alert Behavior During Therapy: WFL for tasks assessed/performed Overall Cognitive Status: Within Functional Limits for tasks assessed  General Comments General comments (skin integrity, edema, etc.): Daughter present during  session.    Exercises     Assessment/Plan    PT Assessment Patient needs continued PT services  PT Problem List Decreased mobility;Decreased strength;Cardiopulmonary status limiting activity;Decreased activity tolerance;Decreased balance       PT Treatment Interventions Therapeutic activities;Gait training;Therapeutic exercise;Patient/family education;Balance training;Stair training;Functional mobility training    PT Goals (Current goals can be found in the Care Plan section)  Acute Rehab PT Goals Patient Stated Goal: to get back to pulmonary rehab PT Goal Formulation: With patient Time For Goal Achievement: 10/06/17 Potential to Achieve Goals: Good    Frequency Min 3X/week   Barriers to discharge Inaccessible home environment stairs everywhere    Co-evaluation               AM-PAC PT "6 Clicks" Daily Activity  Outcome Measure Difficulty turning over in bed (including adjusting bedclothes, sheets and blankets)?: None Difficulty moving from lying on back to sitting on the side of the bed? : None Difficulty sitting down on and standing up from a chair with arms (e.g., wheelchair, bedside commode, etc,.)?: None Help needed moving to and from a bed to chair (including a wheelchair)?: None Help needed walking in hospital room?: A Little Help needed climbing 3-5 steps with a railing? : A Little 6 Click Score: 22    End of Session Equipment Utilized During Treatment: Oxygen Activity Tolerance: Patient tolerated treatment well;Treatment limited secondary to medical complications (Comment) (drop in Sp02 to 85%) Patient left: in bed;with call bell/phone within reach;with family/visitor present;with SCD's reapplied Nurse Communication: Mobility status PT Visit Diagnosis: Unsteadiness on feet (R26.81)    Time: 6195-0932 PT Time Calculation (min) (ACUTE ONLY): 23 min   Charges:   PT Evaluation $PT Eval Moderate Complexity: 1 Mod PT Treatments $Therapeutic Exercise: 8-22  mins   PT G Codes:        Wray Kearns, PT, DPT [phone]    Marguarite Arbour A Zala Degrasse 09/22/2017, 3:15 PM

## 2017-09-23 LAB — GLUCOSE, CAPILLARY
GLUCOSE-CAPILLARY: 168 mg/dL — AB (ref 65–99)
GLUCOSE-CAPILLARY: 142 mg/dL — AB (ref 65–99)
Glucose-Capillary: 196 mg/dL — ABNORMAL HIGH (ref 65–99)
Glucose-Capillary: 111 mg/dL — ABNORMAL HIGH (ref 65–99)
Glucose-Capillary: 146 mg/dL — ABNORMAL HIGH (ref 65–99)
Glucose-Capillary: 125 mg/dL — ABNORMAL HIGH (ref 65–99)
Glucose-Capillary: 131 mg/dL — ABNORMAL HIGH (ref 65–99)

## 2017-09-23 LAB — RENAL FUNCTION PANEL
ALBUMIN: 3.2 g/dL — AB (ref 3.5–5.0)
Anion gap: 9 (ref 5–15)
BUN: 24 mg/dL — AB (ref 6–20)
CALCIUM: 9.5 mg/dL (ref 8.9–10.3)
CO2: 27 mmol/L (ref 22–32)
CREATININE: 0.88 mg/dL (ref 0.44–1.00)
Chloride: 100 mmol/L — ABNORMAL LOW (ref 101–111)
Glucose, Bld: 135 mg/dL — ABNORMAL HIGH (ref 65–99)
PHOSPHORUS: 3.2 mg/dL (ref 2.5–4.6)
Potassium: 4.5 mmol/L (ref 3.5–5.1)
Sodium: 136 mmol/L (ref 135–145)

## 2017-09-23 LAB — MAGNESIUM: MAGNESIUM: 2.2 mg/dL (ref 1.7–2.4)

## 2017-09-23 NOTE — Progress Notes (Signed)
[facility] Attending Note  ADMISSION DATE:  09/21/2017  REFERRING MD :  Dr. Johnney Killian EDP  CHIEF COMPLAINT:  Hypoxia  Presenting HPI:  76 y.o. female former smoker with past medical history significant for IPF recently started on Ofev after office visit on 10/12. Patient also diagnosed with stage IA left-sided lung cancer status post SBRT completed 01/2014. Patient has been participating in pulmonary rehabilitation. Noted to be markedly hypoxic on 10/23 which prompted patient to be evaluated in the emergency department. Reports she experienced a period of increased dyspnea over the summer which seemed to spontaneously resolve and improve. She underwent treatment with 2 courses of antibiotics.  Subjective:  Just ambulating out of the bathroom when I arrived.  Some exertional dyspnea, saturations down to his low is 85% on 2 L.  Up to about 97% on 2 L after resting for approximately 5 minutes  Temp:  [98.8 F (37.1 C)] 98.8 F (37.1 C) (10/25 0531) Pulse Rate:  [72-90] 82 (10/25 0800) Resp:  [14-22] 22 (10/25 0800) BP: (123-137)/(56-65) 135/62 (10/25 0800) SpO2:  [98 %-100 %] 100 % (10/25 0531) Weight:  [130 lb 9.6 oz (59.2 kg)] 130 lb 9.6 oz (59.2 kg) (10/25 0531)  2 L nasal cannula General: 76 year old white female currently sitting up at bedside no acute distress after resting from ambulation.  She is able to speak several word phrases and tolerates this well HEENT: Normocephalic atraumatic mucous membranes are moist there is no jugular venous distention Pulmonary: Equal chest rise no accessory muscle use after resting.  Dry posterior rales Cardiac regular rate and rhythm without murmur rub or gallop normal sinus rhythm appreciated on telemetry monitoring Abdomen: Soft nontender positive bowel sounds no organomegaly Extremities: Warm dry and intact Neuro: Awake oriented no focal deficits  CBC Latest Ref Rng & Units 09/22/2017 09/21/2017 01/04/2014  WBC 4.0 - 10.5  K/uL 8.0 11.1(H) 6.1  Hemoglobin 12.0 - 15.0 g/dL 11.1(L) 12.0 13.5  Hematocrit 36.0 - 46.0 % 34.1(L) 35.7(L) 38.8  Platelets 150 - 400 K/uL 362 372 223    BMP Latest Ref Rng & Units 09/23/2017 09/22/2017 09/21/2017  Glucose 65 - 99 mg/dL 135(H) 158(H) 116(H)  BUN 6 - 20 mg/dL 24(H) 25(H) 25(H)  Creatinine 0.44 - 1.00 mg/dL 0.88 0.96 1.14(H)  Sodium 135 - 145 mmol/L 136 134(L) 136  Potassium 3.5 - 5.1 mmol/L 4.5 4.1 4.2  Chloride 101 - 111 mmol/L 100(L) 95(L) 98(L)  CO2 22 - 32 mmol/L 27 26 25   Calcium 8.9 - 10.3 mg/dL 9.5 9.6 9.8    IMAGING/STUDIES: PET CT 06/23/17: IMPRESSION: 1. The more posterior of the 2 subcarinal lymph nodes is not pathologically enlarged but is faintly hypermetabolic. At this level of metabolism this could be reactive or due to low-grade neoplasm. In the lungs there some consolidation on the left with low-grade associated activity probably from radiation fibrosis or radiation pneumonitis, superimposed on background chronic interstitial lung disease and emphysema. Please note that the several tiny pulmonary nodules seen on recent chest CT are too small to characterize by PET-CT as they are below sensitive PET-CT size thresholds. 2. Striking atherosclerosis. This includes the coronary arteries. Aortic Atherosclerosis (ICD10-I70.0) and Emphysema (ICD10-J43.9). 3. Multiple left renal cysts including a approximately 1 cm suspected complex cyst. 2 mm left kidney lower pole nonobstructive renal calculus. 4. Cholelithiasis. 5. Mild cardiomegaly. CT CHEST W/ CONTRAST 08/31/17: IMPRESSION: 1. Stable radiation fibrosis in the left lung. No evidence of local tumor recurrence in the left lung.  2. No findings suspicious for metastatic disease in the chest. No pathologically enlarged thoracic lymph nodes. Previously described small solid upper right pulmonary nodules are stable to decreased in size. 3. New patchy ground-glass centrilobular nodularity in the upper lungs, most  compatible with a nonspecific infectious or inflammatory pneumonitis. 4. Underlying fibrotic interstitial lung disease with mild honeycombing, unchanged since 06/09/2017, clearly progressed since more remote chest CT of 12/13/2013, worrisome for usual interstitial pneumonia (UIP). 5. Stable trace pericardial effusion/thickening. 6. Three-vessel coronary atherosclerosis. 7. Cholelithiasis. PORT CXR 10/24:  Personally reviewed by me. Left mid lung opacity similar to previous CT scan noted above. No new focal opacity appreciated. Low lung volumes. Bilateral increased interstitial markings consistent with underlying interstitial lung disease. No new pleural effusion appreciated.  MICROBIOLOGY: None.  ANTIBIOTICS: None.   ASSESSMENT/PLAN:   76 y.o. female presenting with acute hypoxic respiratory failure and acute renal failure that seems to have essentially resolved. Patient's hypoxia is likely due to ILD flare. Suspect her episode of increased dyspnea over the summer was also a flare of her underlying ILD. Doubt her current clinical acute worsening is secondary to her remote radiation therapy. Lengthy discussion with the patient about home health care needs as she currently does live in a home with multiple floors and steps to get to her bedroom and full bath.  Acute hypoxic respiratory failure: Likely secondary to ILD/IPF flare.  Plan Prednisone 60 mg oral, starting today Assess walking oximetry to determine home oxygen needs Plan to  start Ofev in the outpatient setting  Acute renal failure; and at risk for fluid and electrolyte imbalance Resolved Plan Avoid nephrotoxins Follow-up as needed chemistry  Essential hypertension Plan Continue to hold losartan Continue Norvasc and hydrochlorothiazide, wonder how much HCTZ is contributing to hyponatremia  Lower extremity leg pain, history of peripheral vascular disease Plan Continue supplemental oxygen, has planned outpatient  evaluation.  History of lung cancer with new pulmonary nodule Plan Follow-up outpatient setting. Marland Kitchen  Prophylaxis: SCDs & heparin subcutaneous every 8 hours. Diet: Regular diet. Code Status: Full code per previous physician discussions. Disposition:  Plan for discharge likely on Friday with home oxygen. Consulting PT & Care Management.  Family Update:  Patient updated at the time of my exam.    Erick Colace ACNP-BC [place] Pager # [phone] OR # [phone] if no answer

## 2017-09-23 NOTE — Progress Notes (Signed)
SATURATION QUALIFICATIONS: (This note is used to comply with regulatory documentation for home oxygen)  Patient Saturations on Room Air at Rest = 84%  Patient Saturations on Room Air while Ambulating = NA  Patient Saturations on 3 Liters of oxygen while Ambulating =88-92%  Please briefly explain why patient needs home oxygen: [place] prior to ambulation

## 2017-09-23 NOTE — Progress Notes (Signed)
Short run SVT at 1406 strip saved to chart by CMT.

## 2017-09-23 NOTE — Plan of Care (Signed)
Problem: Activity: Goal: Risk for activity intolerance will decrease Outcome: Progressing Ambulates in room with standby assist with ease

## 2017-09-23 NOTE — Care Management Note (Signed)
Case Management Note  Patient Details  Name: [name] [name] MRN: [id number] Date of Birth: 1941-01-12  Subjective/Objective:    participating in [facility], presented to ED with increased dyspnea and hypoxia                Action/Plan: Discharge Planning: NCM spoke to pt and dtr, Braylin at bedside. Pt states she lives at home alone. States she does not know if her sockets in her home will support oxygen. Will see if [facility] can go out to her home prior to dc to evaluate home prior to delivery of oxygen concentrator. States she has to use extension cords through out the home. NCM will need qualifying oxygen sat and orders so oxygen can be ordered. Will see if dtr can assist with meeting [place] at home prior to dc for delivery of concentrator. Pt states home was built in 1933. NCM explained [employer] has a maintenance plan that can assist with electrical issues in the home. The plan requires a 30 day wait period prior to being able to utilize plan. Pt states she will check into maintenance plan.   PCP Donavan Burnet MD  Expected Discharge Date:                  Expected Discharge Plan:  Home/Self Care  In-House Referral:  NA  Discharge planning Services  CM Consult  Post Acute Care Choice:  NA Choice offered to:  NA  DME Arranged:  Oxygen DME Agency:  [place] Arranged:  NA [place] Agency:  NA  Status of Service:  In process, will continue to follow  If discussed at Long Length of Stay Meetings, dates discussed:    Additional Comments:  Erenest Rasher, RN 09/23/2017, 8:48 AM

## 2017-09-23 NOTE — Progress Notes (Signed)
Physical Therapy Treatment Patient Details Name: [name] [name] MRN: [id number] DOB: 1941/07/08 Today's Date: 09/23/2017    History of Present Illness Patient is a 76 y/o female who presents from pulmonary rehab with hypoxia. PMH includes stage IA left-sided lung cancer status post SBRT completed 01/2014, IPF, HTN, HLD, depression.    PT Comments    Patient progressing well towards PT goals. Tolerated multiple bouts of ambulation today to work on respiratory and cardiovascular endurance. Pt continues to require supplemental 02 for mobility and her SP02 ranges from 86-96% on 2L.min 02. With increased short bouts of activity, 02 sats dropped less. Encouraged continued mobility. Pt with trial being off 02 at rest. Will follow up tomorrow for stair training prior to d/c.    Follow Up Recommendations  Supervision - Intermittent (pulmonary rehab)     Equipment Recommendations  Other (comment) (rollator)    Recommendations for Other Services       Precautions / Restrictions Precautions Precautions: None Precaution Comments: watch 02 Restrictions Weight Bearing Restrictions: No    Mobility  Bed Mobility Overal bed mobility: Modified Independent             General bed mobility comments: HOB elevated, no assist needed.   Transfers Overall transfer level: Modified independent Equipment used: None Transfers: Sit to/from Stand Sit to Stand: Supervision         General transfer comment: Supervision for safety. Stood from [employer], from chair x4.  Ambulation/Gait Ambulation/Gait assistance: Supervision Ambulation Distance (Feet): 90 Feet (+85'+75') Assistive device: Straight cane (and pushed 02 tank) Gait Pattern/deviations: Step-through pattern;Decreased stride length Gait velocity: decreased Gait velocity interpretation: <1.8 ft/sec, indicative of risk for recurrent falls General Gait Details: Slow, guarded gait. Sp02 ranged from 86-96% on 2L/min 02. 3 seated rest breaks.  Recovers within 1-2 mins with pursed lip breathing. 02 seemed to improve with each bout dropping less.   Stairs            Wheelchair Mobility    Modified Rankin (Stroke Patients Only)       Balance Overall balance assessment: Needs assistance Sitting-balance support: Feet supported;No upper extremity supported Sitting balance-Leahy Scale: Good     Standing balance support: During functional activity Standing balance-Leahy Scale: Fair Standing balance comment: Able to stand statically without UE support. Used SPC and 02 tank for ambulation for BUE support.                            Cognition Arousal/Alertness: Awake/alert Behavior During Therapy: WFL for tasks assessed/performed Overall Cognitive Status: Within Functional Limits for tasks assessed                                        Exercises      General Comments General comments (skin integrity, edema, etc.): Instructed pt to remove 02 at rest to see how she does. Understands to place back on with alarm or drop in number.       Pertinent Vitals/Pain Pain Assessment: No/denies pain    Home Living                      Prior Function            PT Goals (current goals can now be found in the care plan section) Progress towards PT goals: Progressing toward goals  Frequency    Min 3X/week      PT Plan Current plan remains appropriate    Co-evaluation              AM-PAC PT "6 Clicks" Daily Activity  Outcome Measure  Difficulty turning over in bed (including adjusting bedclothes, sheets and blankets)?: None Difficulty moving from lying on back to sitting on the side of the bed? : None Difficulty sitting down on and standing up from a chair with arms (e.g., wheelchair, bedside commode, etc,.)?: None Help needed moving to and from a bed to chair (including a wheelchair)?: None Help needed walking in hospital room?: None Help needed climbing 3-5 steps  with a railing? : A Little 6 Click Score: 23    End of Session Equipment Utilized During Treatment: Oxygen Activity Tolerance: Patient tolerated treatment well;Treatment limited secondary to medical complications (Comment) (02 sats dropped) Patient left: in bed;with call bell/phone within reach;with family/visitor present Nurse Communication: Mobility status PT Visit Diagnosis: Unsteadiness on feet (R26.81)     Time: 2334-3568 PT Time Calculation (min) (ACUTE ONLY): 31 min  Charges:  $Therapeutic Exercise: 23-37 mins                    G Codes:       [name] [name], PT, DPT [phone]     [name] [name] 09/23/2017, 3:05 PM

## 2017-09-23 NOTE — Consult Note (Signed)
            [facility] CM Primary Care Navigator  09/23/2017  [name] [name] 1941/07/18 [id number]   Seen patient at the bedside to identify possible discharge needs.  Patient reports having "low oxygen level at 77% while assessment was done at the Pulmonary Rehab" that had led to this admission.  Patient endorses Dr. Donnie Coffin with [place] [facility] as her primary care provider.   Patient [facility] on [address] to obtain medications without difficulty. Patient reports managing her ownmedications at home straight out of the containers ("not taking much").   Patient reports that she has beendriving prior to admission but her son Inocente Salles) or friendscan providetransportation to her doctors' appointments after discharge when needed. She also mentioned using [place] [facility] transportation if she has to.  Patient reports that she lives alone and is theprimary caregiver for herself. She mentioned that his son (lives few blocks away) or friends can be able to help with her care needs as needed. Patient states having "good family support" and can even stay at son's house if needed.  Anticipated discharge plan is home per patient.  Patientexpressed understanding to callprimary care provider's officewhen she returnshome for a post discharge follow-up appointment within a week or sooner if needed. Patient letter (with PCP's contact number) was provided as a reminder.  Explained to patient about [facility] CM services available for health management at home but she communicated no current needs or concerns at this point.  Patientvoiced understanding to seek referral from primary care provider to [facility] care management services if necessary in the future.  [facility] care management information provided for future needs that may arise.  Patient had verbally agreed and opted for EMMI calls to follow-upher recovery at home.  Referral made to [place]  calls after discharge.   For additional questions please contact:  Edwena Felty A. Vaani Morren, BSN, RN-BC [facility] PRIMARY CARE Navigator Cell: [phone]

## 2017-09-24 LAB — RENAL FUNCTION PANEL
ALBUMIN: 3.3 g/dL — AB (ref 3.5–5.0)
ANION GAP: 11 (ref 5–15)
BUN: 24 mg/dL — ABNORMAL HIGH (ref 6–20)
CHLORIDE: 99 mmol/L — AB (ref 101–111)
CO2: 27 mmol/L (ref 22–32)
Calcium: 9.6 mg/dL (ref 8.9–10.3)
Creatinine, Ser: 0.89 mg/dL (ref 0.44–1.00)
GFR calc Af Amer: >60 mL/min (ref >60)
Glucose, Bld: 112 mg/dL — ABNORMAL HIGH (ref 65–99)
PHOSPHORUS: 2.7 mg/dL (ref 2.5–4.6)
POTASSIUM: 3.8 mmol/L (ref 3.5–5.1)
Sodium: 137 mmol/L (ref 135–145)

## 2017-09-24 LAB — GLUCOSE, CAPILLARY
Glucose-Capillary: 100 mg/dL — ABNORMAL HIGH (ref 65–99)
Glucose-Capillary: 98 mg/dL (ref 65–99)

## 2017-09-24 MED ORDER — PREDNISONE 10 MG PO TABS: ORAL_TABLET | ORAL | 0 refills | Status: DC

## 2017-09-24 NOTE — Care Management (Addendum)
1033 09-24-17 [name], RN, BSN [phone] CM did speak with pt in regards to home 02. Previous CM did set pt up with [facility] for DME 0xygen. New Ambulatory Sat note needed in order for DME to be approved via Insurance. Per pt she has outlets at home that she uses extension cords. [facility] states that pt will need an outlet that will not be connected via an extension cord. Pt has a two level home and she will need to find a central location for the concentrator. Nasal Cannula Tubing is 54 feet long. Pt may have to have a fill station in the home per [employer]. Once [facility] delivers the 02 - rep will reassure that pt has adequate placement for the concentrator to be able to utilize upstairs and downstairs. Pt's son Kimoni Pickerill will meet [facility] at the Home for DME 02 delivery. Pt declined the need for any [facility]. CM did discuss with the patient the benefits of a Librarian, academic as well. Brochure to be provided to patient. Son to provide transportation home.  No further needs from CM at this time.

## 2017-09-24 NOTE — Progress Notes (Signed)
Physical Therapy Treatment Patient Details Name: [name] [name] MRN: [id number] DOB: 27-Apr-1941 Today's Date: 09/24/2017    History of Present Illness Patient is a 76 y/o female who presents from pulmonary rehab with hypoxia. PMH includes stage IA left-sided lung cancer status post SBRT completed 01/2014, IPF, HTN, HLD, depression.    PT Comments    Patient feeling much better today. Able to maintain 02 sats >90% on RA at rest, however once pt starts moving or walking/talking, Sp02 drops to mid 80s. Pt able to recover quickly with pursed lip breathing. Very aware of body and understands implications of low 02 and when/how to check it daily. Pt requires supplemental 02 to maintain 02 sats during mobility. Lengthy discussion re: 02 sats, using pulse ox, energy conservation techniques, exercise etc. Pt plans to d/c home today. Recommend follow up with outpatient pulmonary rehab.    Follow Up Recommendations  Supervision - Intermittent (pulmonary outpatient rehab)     Equipment Recommendations  None recommended by PT    Recommendations for Other Services       Precautions / Restrictions Precautions Precautions: None Precaution Comments: watch 02 Restrictions Weight Bearing Restrictions: No    Mobility  Bed Mobility               General bed mobility comments: Up in chair upon PT arrival.   Transfers Overall transfer level: Modified independent Equipment used: Straight cane Transfers: Sit to/from Stand Sit to Stand: Modified independent (Device/Increase time)         General transfer comment: Stood from chair x4 with use of SPC, no assist needed.   Ambulation/Gait Ambulation/Gait assistance: Supervision Ambulation Distance (Feet): 90 Feet (+80') Assistive device: Straight cane Gait Pattern/deviations: Step-through pattern;Decreased stride length Gait velocity: decreased   General Gait Details: Slow, guarded gait. Able to tolerate walking ~50' on RA with Sp02  >90% but after 50' Sp02 dropped to 86%. Requires 2L/min 02 for mobility. 1 seated rest breaks. Recovers within 1-2 mins with pursed lip breathing.    Stairs            Wheelchair Mobility    Modified Rankin (Stroke Patients Only)       Balance Overall balance assessment: Needs assistance Sitting-balance support: Feet supported;No upper extremity supported Sitting balance-Leahy Scale: Good     Standing balance support: During functional activity Standing balance-Leahy Scale: Fair Standing balance comment: Uses SPC for energy conservation                            Cognition Arousal/Alertness: Awake/alert Behavior During Therapy: WFL for tasks assessed/performed Overall Cognitive Status: Within Functional Limits for tasks assessed                                        Exercises      General Comments        Pertinent Vitals/Pain Pain Assessment: No/denies pain    Home Living                      Prior Function            PT Goals (current goals can now be found in the care plan section) Progress towards PT goals: Progressing toward goals    Frequency    Min 3X/week      PT Plan Current plan remains appropriate  Co-evaluation              AM-PAC PT "6 Clicks" Daily Activity  Outcome Measure  Difficulty turning over in bed (including adjusting bedclothes, sheets and blankets)?: None Difficulty moving from lying on back to sitting on the side of the bed? : None Difficulty sitting down on and standing up from a chair with arms (e.g., wheelchair, bedside commode, etc,.)?: None Help needed moving to and from a bed to chair (including a wheelchair)?: None Help needed walking in hospital room?: None Help needed climbing 3-5 steps with a railing? : A Little 6 Click Score: 23    End of Session Equipment Utilized During Treatment: Oxygen Activity Tolerance: Treatment limited secondary to medical  complications (Comment) (drop in 02 sats) Patient left: in chair;with call bell/phone within reach;Other (comment) (CM inr oom) Nurse Communication: Mobility status PT Visit Diagnosis: Unsteadiness on feet (R26.81)     Time: 0932-3557 PT Time Calculation (min) (ACUTE ONLY): 29 min  Charges:  $Therapeutic Exercise: 8-22 mins $Self Care/Home Management: 8-22                    G Codes:       Wray Kearns, PT, DPT [phone]     Marguarite Arbour A Timarion Agcaoili 09/24/2017, 10:59 AM

## 2017-09-24 NOTE — Discharge Summary (Signed)
Physician Discharge Summary       Patient ID: [name] [name] MRN: [id number] DOB/AGE: 74-Feb-1942 74 y.o.  Admit date: 09/21/2017 Discharge date: 09/24/2017  Discharge Diagnoses:   Acute hypoxic respiratory failure  IPF flare Interstitial lung disease Acute renal failure Essential hypertension History of peripheral vascular disease History of lung cancer New pulmonary nodule   Detailed Hospital Course:  This is a 76 year old former smoker with a known history of interstitial lung disease.  Recently started on Ofev on 10/12.  Also has a diagnosis of stage I a left-sided lung cancer status post SBRT which was completed 01/2014.  Currently undergoing pulmonary rehabilitation.  She was noted to be remarkably hypoxic on 10/23 which prompted her to seek evaluation in the department.  On further evaluation she reported that she was experiencing increased dyspnea over the summer which initially seemed to spontaneously resolve and improve.  As noted she was being seen the pulmonary rehab at which time she was noted to have saturations in the 70s-80s.  She denied fever chills sick exposures.  Did have cough.  She was admitted with a working diagnosis of IPF flare, systemic steroids and supplemental oxygen were provided. She continued to make slow progress over the course of her hospitalization describing significant improvement day after day.  Of note on presentation she was also noted to have acute renal failure for which her antihypertensives and diuretics were held.  This made gradual interval improvement over the course of her hospitalization with this in addition to gentle IV fluid resuscitation.  As her symptom burden improved we began to back down on her steroids, in addition to this we assessed her oxygen requirements.  At time of discharge she still had significant exertional desaturation with oxygen saturations dropping as low as 84% on room air at rest.  She required up to 3 L while  ambulating to maintain oxygen saturations 88-92%.  Because of this we set her up with home oxygen, this did require some home evaluation given concern that the age of her house might not support supplemental oxygen circuitry.  This was evaluated by the home health team, who performed for safety check prior to her discharge.  At time of discharge she is now ready to return home with the plan as outlined below per her active problem list.   Discharge Plan by active problems   1. Acute hypoxic respiratory failure secondary to IPF flare: Discharging her to home.  She will be on oxygen at 3 L via nasal cannula at all time.  We can recheck oxygen requirements on her follow-up visit.  She will be sent home on prednisone 60 mg a day, she is currently on day 2 of 3 of this dosing.  We will decreased her dosing by 10 mg every 3 days.  Plan to resume Ofev as an outpatient 2.  Essential hypertension: We will resume her home Norvasc/hydrochlorothiazide.  Discontinuing losartan, which she can follow-up with her primary care provider in regards to further titration of her antihypertensive regimen in light of her recent renal failure. 3. lower extremity leg pain/cramping.  Presumed peripheral vascular disease.  There is plan for outpatient follow-up for this. 4.  History of lung cancer with new pulmonary nodule.  She has follow-up arranged with her primary pulmonary physician.  Significant Hospital tests/ studies   Consults - none  PET CT 06/23/17: IMPRESSION: 1. The more posterior of the 2 subcarinal lymph nodes is not pathologically enlarged but is faintly hypermetabolic. At this level  of metabolism this could be reactive or due to low-grade neoplasm. In the lungs there some consolidation on the left with low-grade associated activity probably from radiation fibrosis or radiation pneumonitis, superimposed on background chronic interstitial lung disease and emphysema. Please note that the several tiny pulmonary  nodules seen on recent chest CT are too small to characterize by PET-CT as they are below sensitive PET-CT size thresholds. 2. Striking atherosclerosis. This includes the coronary arteries. Aortic Atherosclerosis (ICD10-I70.0) and Emphysema (ICD10-J43.9). 3. Multiple left renal cysts including a approximately 1 cm suspected complex cyst. 2 mm left kidney lower pole nonobstructive renal calculus. 4. Cholelithiasis. 5. Mild cardiomegaly.  CT CHEST W/ CONTRAST 08/31/17: IMPRESSION: 1. Stable radiation fibrosis in the left lung. No evidence of local tumor recurrence in the left lung. 2. No findings suspicious for metastatic disease in the chest. No pathologically enlarged thoracic lymph nodes. Previously described small solid upper right pulmonary nodules are stable to decreased in size. 3. New patchy ground-glass centrilobular nodularity in the upper lungs, most compatible with a nonspecific infectious or inflammatory pneumonitis. 4. Underlying fibrotic interstitial lung disease with mild honeycombing, unchanged since 06/09/2017, clearly progressed since more remote chest CT of 12/13/2013, worrisome for usual interstitial pneumonia (UIP). 5. Stable trace pericardial effusion/thickening. 6. Three-vessel coronary atherosclerosis. 7. Cholelithiasis.  PORT CXR 10/24:  Personally reviewed by me. Left mid lung opacity similar to previous CT scan noted above. No new focal opacity appreciated. Low lung volumes. Bilateral increased interstitial markings consistent with underlying interstitial lung disease. No new pleural effusion appreciated.   Discharge Exam: BP (!) 109/93   Pulse (!) 43   Temp (!) 97.4 F (36.3 C) (Oral)   Resp 18   Ht 5\' 4"  (1.626 m)   Wt 130 lb (59 kg)   SpO2 93%   BMI 22.31 kg/m   Oxygen, 3 L via nasal cannula General: Awake alert no distress HEENT: Normocephalic atraumatic mucous membranes are moist no jugular venous distention Pulmonary: No accessory muscle use, speaking  in complete sentences, bibasilar rales, these are unchanged Cardiac regular rate and rhythm without peripheral edema or murmur rub or gallop Abdomen: Soft nontender no organomegaly normal bowel sounds Neuro/psychiatric awake oriented no focal deficits affect appropriate  Labs at discharge Lab Results  Component Value Date   CREATININE 0.89 09/24/2017   BUN 24 (H) 09/24/2017   NA 137 09/24/2017   K 3.8 09/24/2017   CL 99 (L) 09/24/2017   CO2 27 09/24/2017   Lab Results  Component Value Date   WBC 8.0 09/22/2017   HGB 11.1 (L) 09/22/2017   HCT 34.1 (L) 09/22/2017   MCV 96.6 09/22/2017   PLT 362 09/22/2017   Lab Results  Component Value Date   ALT 15 09/21/2017   AST 25 09/21/2017   ALKPHOS 81 09/21/2017   BILITOT 1.1 09/21/2017   Lab Results  Component Value Date   INR 1.08 01/04/2014   INR 0.93 02/16/2013   INR 1.14 11/11/2012    Current radiology studies No results found.  Disposition:  06-Home-Health Care Svc  Discharge Instructions    For home use only DME oxygen    Complete by:  As directed    Liters per Minute:  3   Frequency:  Continuous (stationary and portable oxygen unit needed)   Oxygen conserving device:  No   Oxygen delivery system:  Gas     Allergies as of 09/24/2017      Reactions   Penicillins Hives, Swelling   Has patient  had a PCN reaction causing immediate rash, facial/tongue/throat swelling, SOB or lightheadedness with hypotension: Yes Has patient had a PCN reaction causing severe rash involving mucus membranes or skin necrosis: No Has patient had a PCN reaction that required hospitalization: No Has patient had a PCN reaction occurring within the last 10 years: No If all of the above answers are "NO", then may proceed with Cephalosporin use.      Medication List    STOP taking these medications   ibuprofen 200 MG tablet Commonly known as:  ADVIL,MOTRIN   losartan 100 MG tablet Commonly known as:  COZAAR     TAKE these  medications   acetaminophen 500 MG tablet Commonly known as:  TYLENOL Take 1,000 mg by mouth every 6 (six) hours as needed for pain.   amLODipine 10 MG tablet Commonly known as:  NORVASC Take 1 tablet (10 mg total) by mouth daily.   ARTIFICIAL TEARS OP Place 1 drop into both eyes daily.   atorvastatin 40 MG tablet Commonly known as:  LIPITOR Take 20 mg by mouth every morning.   hydrochlorothiazide 25 MG tablet Commonly known as:  HYDRODIURIL Take 25 mg by mouth daily before breakfast.   loratadine 10 MG tablet Commonly known as:  CLARITIN Take 10 mg by mouth daily.   predniSONE 10 MG tablet Commonly known as:  DELTASONE 6 tabs x1d f/b 5tabsx3d, f/b 4tabs x 3d, f/b 3tabx3d, f/b 2 tab x 3d, f/b 1tabx3d & DC            Durable Medical Equipment        Start     Ordered   09/24/17 [phone]  For home use only DME oxygen  Once    Question Answer Comment  Mode or (Route) Nasal cannula   Liters per Minute 3   Frequency Continuous (stationary and portable oxygen unit needed)   Oxygen conserving device No   Oxygen delivery system Gas      09/24/17 0909   09/24/17 0000  For home use only DME oxygen    Question Answer Comment  Liters per Minute 3   Frequency Continuous (stationary and portable oxygen unit needed)   Oxygen conserving device No   Oxygen delivery system Gas      09/24/17 0915       Discharged Condition: good  Physician Statement:   The Patient was personally examined, the discharge assessment and plan has been personally reviewed and I agree with ACNP Hanh Kertesz's assessment and plan.45 minutes of time have been dedicated to discharge assessment, planning and discharge instructions.   Signed: Clementeen Graham 09/24/2017, 9:15 AM

## 2017-09-24 NOTE — Progress Notes (Signed)
Discharge instructions reviewed with pt. Pt has no questions at this time. Pt stated that her PCP is suppose to call Monday to schedule appointment. Pt denies any pain. Pt's stated her son will be taking her home.

## 2017-09-24 NOTE — Progress Notes (Signed)
[facility] delivered 2 full tanks of oxygen.  Oxygen Concentrator has not been delivered yet but should be delivered between the hours of 3 and 7 today. Pt advised and educated on the importance of not going home until the concentrator was delivered. Pt refused and is adament about going home now. Pt being discharged now per her request.

## 2017-09-24 NOTE — Care Management Important Message (Signed)
Important Message  Patient Details  Name: [name] [name] MRN: [id number] Date of Birth: [birth year]   Medicare Important Message Given:  Yes    [name] May 09/24/2017, 10:45 AM

## 2017-09-24 NOTE — Progress Notes (Signed)
[facility] Attending Note  ADMISSION DATE:  09/21/2017  REFERRING MD :  Dr. Johnney Killian EDP  CHIEF COMPLAINT:  Hypoxia  Presenting HPI:  76 y.o. female former smoker with past medical history significant for IPF recently started on Ofev after office visit on 10/12. Patient also diagnosed with stage IA left-sided lung cancer status post SBRT completed 01/2014. Patient has been participating in pulmonary rehabilitation. Noted to be markedly hypoxic on 10/23 which prompted patient to be evaluated in the emergency department. Reports she experienced a period of increased dyspnea over the summer which seemed to spontaneously resolve and improve. She underwent treatment with 2 courses of antibiotics.  Subjective:  No chest pain or pressure. Minimal cough. Dyspnea with ambulation relatively unchanged.  Review of Systems:  No subjective fever or chills. Minimal sweating around the nape of her neck. No true night sweats. No abdominal pain or nausea.  Temp:  [97.4 F (36.3 C)-98.5 F (36.9 C)] 97.4 F (36.3 C) (10/26 0200) Pulse Rate:  [70-73] 73 (10/26 0200) Resp:  [18-22] 18 (10/26 0200) BP: (137-141)/(68-87) 137/68 (10/26 0200) SpO2:  [94 %] 94 % (10/26 0200) Weight:  [130 lb (59 kg)] 130 lb (59 kg) (10/26 0424)   Gen.: Awake. No distress. Watching TV. Integument: Warm. Dry. No rash. Pulmonary: Basilar crackles unchanged. No work of breathing on nasal cannula. Speaking in complete sentences. Cardiovascular: Regular rate. No edema. No JVD. Abdomen: Soft. Nontender. Nondistended. Neurological: No meningismus. Cranial nerves grossly intact. Moving all 4 extremities equally.  CBC Latest Ref Rng & Units 09/22/2017 09/21/2017 01/04/2014  WBC 4.0 - 10.5 K/uL 8.0 11.1(H) 6.1  Hemoglobin 12.0 - 15.0 g/dL 11.1(L) 12.0 13.5  Hematocrit 36.0 - 46.0 % 34.1(L) 35.7(L) 38.8  Platelets 150 - 400 K/uL 362 372 223    BMP Latest Ref Rng & Units 09/24/2017 09/23/2017 09/22/2017   Glucose 65 - 99 mg/dL 112(H) 135(H) 158(H)  BUN 6 - 20 mg/dL 24(H) 24(H) 25(H)  Creatinine 0.44 - 1.00 mg/dL 0.89 0.88 0.96  Sodium 135 - 145 mmol/L 137 136 134(L)  Potassium 3.5 - 5.1 mmol/L 3.8 4.5 4.1  Chloride 101 - 111 mmol/L 99(L) 100(L) 95(L)  CO2 22 - 32 mmol/L 27 27 26   Calcium 8.9 - 10.3 mg/dL 9.6 9.5 9.6    IMAGING/STUDIES: PET CT 06/23/17: IMPRESSION: 1. The more posterior of the 2 subcarinal lymph nodes is not pathologically enlarged but is faintly hypermetabolic. At this level of metabolism this could be reactive or due to low-grade neoplasm. In the lungs there some consolidation on the left with low-grade associated activity probably from radiation fibrosis or radiation pneumonitis, superimposed on background chronic interstitial lung disease and emphysema. Please note that the several tiny pulmonary nodules seen on recent chest CT are too small to characterize by PET-CT as they are below sensitive PET-CT size thresholds. 2. Striking atherosclerosis. This includes the coronary arteries. Aortic Atherosclerosis (ICD10-I70.0) and Emphysema (ICD10-J43.9). 3. Multiple left renal cysts including a approximately 1 cm suspected complex cyst. 2 mm left kidney lower pole nonobstructive renal calculus. 4. Cholelithiasis. 5. Mild cardiomegaly. CT CHEST W/ CONTRAST 08/31/17: IMPRESSION: 1. Stable radiation fibrosis in the left lung. No evidence of local tumor recurrence in the left lung. 2. No findings suspicious for metastatic disease in the chest. No pathologically enlarged thoracic lymph nodes. Previously described small solid upper right pulmonary nodules are stable to decreased in size. 3. New patchy ground-glass centrilobular nodularity in the upper lungs, most compatible with a nonspecific infectious  or inflammatory pneumonitis. 4. Underlying fibrotic interstitial lung disease with mild honeycombing, unchanged since 06/09/2017, clearly progressed since more remote chest CT of  12/13/2013, worrisome for usual interstitial pneumonia (UIP). 5. Stable trace pericardial effusion/thickening. 6. Three-vessel coronary atherosclerosis. 7. Cholelithiasis. PORT CXR 10/24:  Previously reviewed by me. Left mid lung opacity similar to previous CT scan noted above. No new focal opacity appreciated. Low lung volumes. Bilateral increased interstitial markings consistent with underlying interstitial lung disease. No new pleural effusion appreciated.  MICROBIOLOGY: None.  ANTIBIOTICS: None.   ASSESSMENT/PLAN:  76 y.o. female with acute hypoxic respiratory failure likely secondary to ILD/IPF flare. Continuing to taper and is on every 3 days. Acute renal failure, hypomagnesemia, and hyponatremia have resolved. Patient does have mild hyperglycemiabut has required minimal insulin. Therefore, I am not prescribing insulin as an outpatient as we intend to further de-escalate steroid therapy. Her essential hypertension is well controlled on 2 of her 3 home medications. I instructed the patient to contact our office if she had any questions or concerns before her follow-up appointment. I'm awaiting confirmation of all oxygen delivery prior to discharge today. Patient is appropriately aware of the risks 5 an explosion with exposing oxygen to open flame.  1. Acute hypoxic respiratory failure: Secondary to IPF flare. Patient does demonstrate any significant desaturation and oxygen requirement with ambulation. 2. IPF flare: Continuing prednisone 60 mg daily #2/3. Plan for a 10 mg taper every 3 days until seen in office. Plan to start Ofev as outpatient. 3. Acute renal failure: Resolved. 4. Hypomagnesemia: Resolved. 5. Hyperglycemia: Mild. Secondary to steroids. No need for home insulin therapy. 6. Essential hypertension: Currently normotensive on Norvasc and hydrochlorothiazide. Continuing these medications. Holding home losartan. Patient to follow-up with her PCP regarding antihypertensive  regimen. 7. Hyponatremia: Resolved. 8. Lower extremity leg pain/cramping:  Plan for outpatient evaluation for probably underlying PVD.  9. History of lung cancer with new nodule:  Follow-up imaging as per primary pulmonologist.  10. Follow-up:  Appointment arranged for 11/2 at 10:15am.   Prophylaxis: SCDs & heparin subcutaneous every 8 hours. Diet: Regular diet. Code Status: Full code per previous physician discussions. Disposition:  Routinely following. Plan for discharge pending evaluation for home oxygen. Family Update:  Patient updated at the time of my exam.  Sonia Baller. Ashok Cordia, M.D. [facility] Pager:  [phone] After 7pm or if no response, call [phone] 8:13 AM 09/24/17

## 2017-09-24 NOTE — Plan of Care (Signed)
Problem: Nutrition: Goal: Adequate nutrition will be maintained Outcome: Progressing Patient consumes 75-100% of meals

## 2017-09-24 NOTE — Progress Notes (Signed)
SATURATION QUALIFICATIONS: (This note is used to comply with regulatory documentation for home oxygen)  Patient Saturations on Room Air at Rest = 94%  Patient Saturations on Room Air while Ambulating = 86%  Patient Saturations on 2 Liters of oxygen while Ambulating = 88- 90%   Please briefly explain why patient needs home oxygen: Patients oxygen saturation dropped to 80s while walking. Also dropped with talking on room air. Placed on 2 L Oxygen and sats came back up to 90.

## 2017-09-27 ENCOUNTER — Telehealth: Payer: Self-pay | Admitting: Pulmonary Disease

## 2017-09-27 NOTE — Telephone Encounter (Signed)
Rescheduled patients orientation for 10/05/17.

## 2017-09-27 NOTE — Telephone Encounter (Signed)
Received a fax from [facility] stating that the patient's Ofev 100mg  has been approved until 09/22/2019/

## 2017-09-29 ENCOUNTER — Other Ambulatory Visit: Payer: Self-pay

## 2017-09-29 NOTE — Patient Outreach (Signed)
[facility]) Care Management  09/29/2017  [name] [name] 04-09-1941 [id number]  EMMI: General discharge Referral date: 09/29/17 Referral source: EMMI General discharge red alert Referral reason: Unfilled prescriptions: YES Day # 1  Telephone call to patient regarding EMMI general discharge red alert. HIPAA verified. Discussed EMMI general discharge with patient. Patient states, "the automated phone calls are very annoying and I would like for them to be stopped."  Patient states she has all of her prescriptions and takes her medications as prescribed. Patient states she has transportation to her appointments. Patient states she does not have a follow up with her primary MD but she did talk with the office after her discharge from the hospital. Patient denies any new onset of symptoms. Patient states she is on oxygen at 2 liters continuous. Patient reports she is aware of which doctors to call if she is having problems. RNCM advised patient to notify MD of any changes in condition prior to scheduled appointment. RNCM verified patient aware of 911 services for urgent/ emergent needs. Patient states she has answered all of this RNCM's at this time and does not have any further needs.  Patient did not take contact information for [facility] care management or 24 hour nurse line. Will send information in mail  PLAN: RNCM will refer patient to care management assistant to close patient due to patient being assessed and having no further needs.  RNCM will send patient [facility] care management brochure and 24 hours nurse call line magnet for future reference.  Quinn Plowman RN,BSN,CCM [place] Telephonic  [phone]

## 2017-10-04 ENCOUNTER — Other Ambulatory Visit: Payer: Self-pay

## 2017-10-04 NOTE — Patient Outreach (Signed)
[facility]) Care Management  10/04/2017  Caleah Tortorelli [birth year] [id number]   Patient request all EMMI general discharge calls be discontinued as per [facility] note on 09/29/17.  PLAN; RNCM will notify care management to discontinue EMMI general discharge calls per patient request.   Quinn Plowman RN,BSN,CCM [facility] Telephonic  [phone]

## 2017-10-05 ENCOUNTER — Encounter (HOSPITAL_COMMUNITY): Payer: Self-pay

## 2017-10-05 ENCOUNTER — Encounter (HOSPITAL_COMMUNITY)
Admission: RE | Admit: 2017-10-05 | Discharge: 2017-10-05 | Disposition: A | Discharge status: Home / Self Care | Payer: Medicare Other | Source: Ambulatory Visit | Attending: Pulmonary Disease | Admitting: Pulmonary Disease

## 2017-10-05 DIAGNOSIS — M545 Low back pain: Secondary | ICD-10-CM | POA: Insufficient documentation

## 2017-10-05 DIAGNOSIS — Z85118 Personal history of other malignant neoplasm of bronchus and lung: Secondary | ICD-10-CM | POA: Insufficient documentation

## 2017-10-05 DIAGNOSIS — G8929 Other chronic pain: Secondary | ICD-10-CM | POA: Insufficient documentation

## 2017-10-05 DIAGNOSIS — Z79899 Other long term (current) drug therapy: Secondary | ICD-10-CM | POA: Diagnosis not present

## 2017-10-05 DIAGNOSIS — M1990 Unspecified osteoarthritis, unspecified site: Secondary | ICD-10-CM | POA: Diagnosis not present

## 2017-10-05 DIAGNOSIS — I10 Essential (primary) hypertension: Secondary | ICD-10-CM | POA: Insufficient documentation

## 2017-10-05 DIAGNOSIS — Z87891 Personal history of nicotine dependence: Secondary | ICD-10-CM | POA: Insufficient documentation

## 2017-10-05 DIAGNOSIS — J8410 Pulmonary fibrosis, unspecified: Secondary | ICD-10-CM | POA: Insufficient documentation

## 2017-10-05 DIAGNOSIS — E785 Hyperlipidemia, unspecified: Secondary | ICD-10-CM | POA: Diagnosis not present

## 2017-10-05 DIAGNOSIS — Z96641 Presence of right artificial hip joint: Secondary | ICD-10-CM | POA: Diagnosis not present

## 2017-10-05 NOTE — Progress Notes (Signed)
Pulmonary Individual Treatment Plan  Patient Details  Name: [name] [name] MRN: [id number] Date of Birth: 1941/08/28 Referring Provider:     PULMONARY REHAB OTHER RESP ORIENTATION from 10/05/2017 in [place]  Referring Provider  Dr. Elsworth Soho      Initial Encounter Date:    [place] from 10/05/2017 in [place]  Date  10/05/17  Referring Provider  Dr. Elsworth Soho      Visit Diagnosis: No diagnosis found.  Patient's Home Medications on Admission:   Current Outpatient Medications:  .  acetaminophen (TYLENOL) 500 MG tablet, Take 1,000 mg by mouth every 6 (six) hours as needed for pain., Disp: , Rfl:  .  amLODipine (NORVASC) 10 MG tablet, Take 1 tablet (10 mg total) by mouth daily., Disp: 30 tablet, Rfl: 0 .  atorvastatin (LIPITOR) 40 MG tablet, Take 20 mg by mouth every morning., Disp: , Rfl:  .  hydrochlorothiazide (HYDRODIURIL) 25 MG tablet, Take 25 mg by mouth daily before breakfast. , Disp: , Rfl:  .  Hypromellose (ARTIFICIAL TEARS OP), Place 1 drop into both eyes daily., Disp: , Rfl:  .  loratadine (CLARITIN) 10 MG tablet, Take 10 mg by mouth daily., Disp: , Rfl:  .  predniSONE (DELTASONE) 10 MG tablet, 6 tabs x1d f/b 5tabsx3d, f/b 4tabs x 3d, f/b 3tabx3d, f/b 2 tab x 3d, f/b 1tabx3d & DC, Disp: 51 tablet, Rfl: 0  Past Medical History: Past Medical History:  Diagnosis Date  . Anxiety   . Arthritis    a. 10/2012 s/p Right THA.  Marland Kitchen Arthritis    "hands" (09/21/2017)  . Chronic lower back pain   . Complication of anesthesia    "couldn't wake me up after my hip surgery" (09/21/2017)  . Depression   . History of ARDS   . Hyperlipidemia   . Hypertension   . Lung cancer ([place]) 02/16/13   LUL lung SQUAMOUS CELL  . Lung cancer ([place]) dx'd 12/2013    LLL ADENOCARCINOMA  . Pulmonary fibrosis ([place]) dx'd ~ 07/2017  . Pulmonary nodule    a. 10/2012 CT: 28mm anterior LUL nodule w/ spiculated appearance  - rec PET CT.  . Seasonal allergies    "I take Claritin" (09/21/2017)  . Sinus headache     Tobacco Use: Social History   Tobacco Use  Smoking Status Former Smoker  . Packs/day: 1.00  . Years: 40.00  . Pack years: 40.00  . Types: Cigarettes  . Last attempt to quit: 11/03/2012  . Years since quitting: 4.9  Smokeless Tobacco Never Used    Labs: Recent Review Flowsheet Data    Labs for ITP Cardiac and Pulmonary Rehab Latest Ref Rng & Units 11/11/2012 11/12/2012 11/12/2012 11/12/2012 11/12/2012   Trlycerides <150 mg/dL - - - - -   PHART 7.350 - 7.450 7.374 7.392 7.405 7.512(H) 7.473(H)   PCO2ART 35.0 - 45.0 mmHg 31.1(L) 51.1(H) 53.7(H) 47.8(H) 53.4(H)   HCO3 20.0 - 24.0 mEq/L 17.7(L) 30.8(H) 32.9(H) 38.1(H) 38.6(H)   TCO2 0 - 100 mmol/L 17.3 28.3 30.6 34.6 35.3   ACIDBASEDEF 0.0 - 2.0 mmol/L 6.4(H) - - - -   O2SAT % 94.2 93.3 94.4 98.8 99.5      Capillary Blood Glucose: Lab Results  Component Value Date   GLUCAP 98 09/24/2017   GLUCAP 100 (H) 09/24/2017   GLUCAP 142 (H) 09/23/2017   GLUCAP 131 (H) 09/23/2017   GLUCAP 125 (H) 09/23/2017     Pulmonary Assessment  Scores:   Pulmonary Function Assessment: Pulmonary Function Assessment - 10/05/17 0909      Breath   Bilateral Breath Sounds  Expiratory;Wheezes fine crackles in bases   fine crackles in bases      Exercise Target Goals: Date: 10/05/17  Exercise Program Goal: Individual exercise prescription set with THRR, safety & activity barriers. Participant demonstrates ability to understand and report RPE using BORG scale, to self-measure pulse accurately, and to acknowledge the importance of the exercise prescription.  Exercise Prescription Goal: Starting with aerobic activity 30 plus minutes a day, 3 days per week for initial exercise prescription. Provide home exercise prescription and guidelines that participant acknowledges understanding prior to discharge.  Activity Barriers & Risk  Stratification: Activity Barriers & Cardiac Risk Stratification - 10/05/17 0851      Activity Barriers & Cardiac Risk Stratification   Activity Barriers  Shortness of Breath;Deconditioning;Back Problems       6 Minute Walk: 6 Minute Walk    Row Name 09/21/17 0941         6 Minute Walk   Phase  Initial     Distance  400 feet     Walk Time  2 minutes     # of Rest Breaks  0     Symptoms  Yes (comment)     Comments  Walk test terminated after 2 laps (400 feet) due to desaturations     Resting HR  108 bpm     Resting BP  124/62     Resting Oxygen Saturation   74 % room air     Exercise Oxygen Saturation  during 6 min walk  83 % 3 liters       Interval Oxygen   Interval Oxygen?  Yes     Baseline Oxygen Saturation %  74 %     1 Minute Oxygen Saturation %  92 %     1 Minute Liters of Oxygen  2 L     2 Minute Oxygen Saturation %  83 %     2 Minute Liters of Oxygen  3 L        Oxygen Initial Assessment: Oxygen Initial Assessment - 10/05/17 1319      Initial 6 min Walk   Oxygen Used  Continuous;E-Tanks    Liters per minute  2      Program Oxygen Prescription   Program Oxygen Prescription  E-Tanks;Continuous    Liters per minute  2       Oxygen Re-Evaluation:   Oxygen Discharge (Final Oxygen Re-Evaluation):   Initial Exercise Prescription: Initial Exercise Prescription - 10/05/17 1300      Date of Initial Exercise RX and Referring Provider   Date  10/05/17    Referring Provider  Dr. Elsworth Soho      Oxygen   Oxygen  Continuous    Liters  2      Bike   Level  0.4    Minutes  17      NuStep   Level  2    Minutes  17    METs  1.5      Track   Laps  10    Minutes  17      Prescription Details   Frequency (times per week)  2    Duration  Progress to 45 minutes of aerobic exercise without signs/symptoms of physical distress      Intensity   THRR 40-80% of Max Heartrate  58-117    Ratings of  Perceived Exertion  11-13    Perceived Dyspnea  0-4       Progression   Progression  Continue progressive overload as per policy without signs/symptoms or physical distress.      Resistance Training   Training Prescription  Yes    Weight  orange bands    Reps  10-15       Perform Capillary Blood Glucose checks as needed.  Exercise Prescription Changes:   Exercise Comments:   Exercise Goals and Review: Exercise Goals    Row Name 10/05/17 0854             Exercise Goals   Increase Physical Activity  Yes       Intervention  Provide advice, education, support and counseling about physical activity/exercise needs.;Develop an individualized exercise prescription for aerobic and resistive training based on initial evaluation findings, risk stratification, comorbidities and participant's personal goals.       Expected Outcomes  Achievement of increased cardiorespiratory fitness and enhanced flexibility, muscular endurance and strength shown through measurements of functional capacity and personal statement of participant.       Increase Strength and Stamina  Yes       Intervention  Provide advice, education, support and counseling about physical activity/exercise needs.;Develop an individualized exercise prescription for aerobic and resistive training based on initial evaluation findings, risk stratification, comorbidities and participant's personal goals.       Expected Outcomes  Achievement of increased cardiorespiratory fitness and enhanced flexibility, muscular endurance and strength shown through measurements of functional capacity and personal statement of participant.       Able to understand and use rate of perceived exertion (RPE) scale  Yes       Intervention  Provide education and explanation on how to use RPE scale       Expected Outcomes  Long Term:  Able to use RPE to guide intensity level when exercising independently       Able to understand and use Dyspnea scale  Yes       Intervention  Provide education and explanation on how  to use Dyspnea scale       Expected Outcomes  Short Term: Able to use Dyspnea scale daily in rehab to express subjective sense of shortness of breath during exertion;Long Term: Able to use Dyspnea scale to guide intensity level when exercising independently       Knowledge and understanding of Target Heart Rate Range (THRR)  Yes       Intervention  Provide education and explanation of THRR including how the numbers were predicted and where they are located for reference       Expected Outcomes  Short Term: Able to state/look up THRR;Short Term: Able to use daily as guideline for intensity in rehab;Long Term: Able to use THRR to govern intensity when exercising independently       Understanding of Exercise Prescription  Yes       Intervention  Provide education, explanation, and written materials on patient's individual exercise prescription       Expected Outcomes  Short Term: Able to explain program exercise prescription;Long Term: Able to explain home exercise prescription to exercise independently          Exercise Goals Re-Evaluation :   Discharge Exercise Prescription (Final Exercise Prescription Changes):   Nutrition:  Target Goals: Understanding of nutrition guidelines, daily intake of sodium 1500mg , cholesterol 200mg , calories 30% from fat and 7% or less from saturated fats, daily to have 5  or more servings of fruits and vegetables.  Biometrics: Pre Biometrics - 10/05/17 0930      Pre Biometrics   Grip Strength  24 kg        Nutrition Therapy Plan and Nutrition Goals:   Nutrition Discharge: Rate Your Plate Scores:   Nutrition Goals Re-Evaluation:   Nutrition Goals Discharge (Final Nutrition Goals Re-Evaluation):   Psychosocial: Target Goals: Acknowledge presence or absence of significant depression and/or stress, maximize coping skills, provide positive support system. Participant is able to verbalize types and ability to use techniques and skills needed for  reducing stress and depression.  Initial Review & Psychosocial Screening: Initial Psych Review & Screening - 10/05/17 0911      Initial Review   Current issues with  None Identified      Family Dynamics   Good Support System?  Yes      Barriers   Psychosocial barriers to participate in program  There are no identifiable barriers or psychosocial needs.      Screening Interventions   Interventions  Encouraged to exercise       Quality of Life Scores:   PHQ-9: Recent Review Flowsheet Data    Depression screen [facility] 2/9 10/05/2017 05/31/2017 03/12/2016 03/12/2016 07/27/2014   Decreased Interest 0 0 0 0 0   Down, Depressed, Hopeless 0 0 0 0 0   PHQ - 2 Score 0 0 0 0 0   Altered sleeping 0 - - - -   Tired, decreased energy 0 - - - -   Change in appetite 0 - - - -   Feeling bad or failure about yourself  0 - - - -   Trouble concentrating 0 - - - -   Moving slowly or fidgety/restless 0 - - - -   Suicidal thoughts 0 - - - -   PHQ-9 Score 0 - - - -   Difficult doing work/chores Not difficult at all - - - -     Interpretation of Total Score  Total Score Depression Severity:  1-4 = Minimal depression, 5-9 = Mild depression, 10-14 = Moderate depression, 15-19 = Moderately severe depression, 20-27 = Severe depression   Psychosocial Evaluation and Intervention: Psychosocial Evaluation - 10/05/17 0912      Psychosocial Evaluation & Interventions   Interventions  Encouraged to exercise with the program and follow exercise prescription    Expected Outcomes  patient will remain free from psychosocial barriers to participation in pulmonary rehab    Continue Psychosocial Services   No Follow up required       Psychosocial Re-Evaluation:   Psychosocial Discharge (Final Psychosocial Re-Evaluation):   Education: Education Goals: Education classes will be provided on a weekly basis, covering required topics. Participant will state understanding/return demonstration of topics  presented.  Learning Barriers/Preferences: Learning Barriers/Preferences - 10/05/17 4098      Learning Barriers/Preferences   Learning Barriers  None    Learning Preferences  Computer/Internet;Group Instruction;Individual Instruction;Skilled Demonstration;Verbal Instruction;Video;Written Material       Education Topics: Risk Factor Reduction:  -Group instruction that is supported by a PowerPoint presentation. Instructor discusses the definition of a risk factor, different risk factors for pulmonary disease, and how the heart and lungs work together.     Nutrition for Pulmonary Patient:  -Group instruction provided by PowerPoint slides, verbal discussion, and written materials to support subject matter. The instructor gives an explanation and review of healthy diet recommendations, which includes a discussion on weight management, recommendations for fruit and vegetable  consumption, as well as protein, fluid, caffeine, fiber, sodium, sugar, and alcohol. Tips for eating when patients are short of breath are discussed.   Pursed Lip Breathing:  -Group instruction that is supported by demonstration and informational handouts. Instructor discusses the benefits of pursed lip and diaphragmatic breathing and detailed demonstration on how to preform both.     Oxygen Safety:  -Group instruction provided by PowerPoint, verbal discussion, and written material to support subject matter. There is an overview of "What is Oxygen" and "Why do we need it".  Instructor also reviews how to create a safe environment for oxygen use, the importance of using oxygen as prescribed, and the risks of noncompliance. There is a brief discussion on traveling with oxygen and resources the patient may utilize.   Oxygen Equipment:  -Group instruction provided by [facility] Staff utilizing handouts, written materials, and equipment demonstrations.   Signs and Symptoms:  -Group instruction provided by written material  and verbal discussion to support subject matter. Warning signs and symptoms of infection, stroke, and heart attack are reviewed and when to call the physician/911 reinforced. Tips for preventing the spread of infection discussed.   Advanced Directives:  -Group instruction provided by verbal instruction and written material to support subject matter. Instructor reviews Advanced Directive laws and proper instruction for filling out document.   Pulmonary Video:  -Group video education that reviews the importance of medication and oxygen compliance, exercise, good nutrition, pulmonary hygiene, and pursed lip and diaphragmatic breathing for the pulmonary patient.   Exercise for the Pulmonary Patient:  -Group instruction that is supported by a PowerPoint presentation. Instructor discusses benefits of exercise, core components of exercise, frequency, duration, and intensity of an exercise routine, importance of utilizing pulse oximetry during exercise, safety while exercising, and options of places to exercise outside of rehab.     Pulmonary Medications:  -Verbally interactive group education provided by instructor with focus on inhaled medications and proper administration.   Anatomy and Physiology of the Respiratory System and Intimacy:  -Group instruction provided by PowerPoint, verbal discussion, and written material to support subject matter. Instructor reviews respiratory cycle and anatomical components of the respiratory system and their functions. Instructor also reviews differences in obstructive and restrictive respiratory diseases with examples of each. Intimacy, Sex, and Sexuality differences are reviewed with a discussion on how relationships can change when diagnosed with pulmonary disease. Common sexual concerns are reviewed.   MD DAY -A group question and answer session with a medical doctor that allows participants to ask questions that relate to their pulmonary disease  state.   OTHER EDUCATION -Group or individual verbal, written, or video instructions that support the educational goals of the pulmonary rehab program.   Knowledge Questionnaire Score: Knowledge Questionnaire Score - 10/05/17 0909      Knowledge Questionnaire Score   Pre Score  11/13       Core Components/Risk Factors/Patient Goals at Admission: Personal Goals and Risk Factors at Admission - 10/05/17 0910      Core Components/Risk Factors/Patient Goals on Admission   Improve shortness of breath with ADL's  Yes    Intervention  Provide education, individualized exercise plan and daily activity instruction to help decrease symptoms of SOB with activities of daily living.    Expected Outcomes  Short Term: Achieves a reduction of symptoms when performing activities of daily living.    Develop more efficient breathing techniques such as purse lipped breathing and diaphragmatic breathing; and practicing self-pacing with activity  Yes  Intervention  Provide education, demonstration and support about specific breathing techniuqes utilized for more efficient breathing. Include techniques such as pursed lipped breathing, diaphragmatic breathing and self-pacing activity.    Expected Outcomes  Short Term: Participant will be able to demonstrate and use breathing techniques as needed throughout daily activities.    Increase knowledge of respiratory medications and ability to use respiratory devices properly   Yes    Intervention  Provide education and demonstration as needed of appropriate use of medications, inhalers, and oxygen therapy.    Expected Outcomes  Short Term: Achieves understanding of medications use. Understands that oxygen is a medication prescribed by physician. Demonstrates appropriate use of inhaler and oxygen therapy.       Core Components/Risk Factors/Patient Goals Review:    Core Components/Risk Factors/Patient Goals at Discharge (Final Review):    ITP  Comments:   Comments:

## 2017-10-05 NOTE — Addendum Note (Signed)
Encounter addended by: Gaspar Bidding on: 10/05/2017 1:26 PM  Actions taken: Visit Navigator Flowsheet section accepted, Flowsheet accepted, Sign clinical note

## 2017-10-05 NOTE — Progress Notes (Signed)
[name] [name] 76 y.o. female Pulmonary Rehab Orientation Note Patient arrived today in Cardiac and Pulmonary Rehab for orientation to Pulmonary Rehab. She was transported from [employer] via wheel chair. She does carry portable oxygen. Per pt, she uses oxygen intermittently. 2 liters continuously at night and 2 liters with any physical exertion. She is allowed to not use oxygen while sedentary. Color good, skin warm and dry. Patient is oriented to time and place. Patient's medical history, psychosocial health, and medications reviewed. Psychosocial assessment reveals pt lives alone. Pt is currently retired. Pt hobbies include reading. Pt reports her stress level is moderate. Areas of stress/anxiety include Health and the health of her brother. Pt does not exhibit signs of depression. PHQ2/9 score 0/0. Pt shows good  coping skills with positive outlook. She is offered emotional support and reassurance. Will continue to monitor and evaluate progress toward psychosocial goal(s) of remaining positive about her physical health and the progression of her IPF. Physical assessment reveals heart rate is normal. Lungs clear upper left with mild expiratory wheeze upper right. Fine crackles noted to lower bases bilat. Grip strength equal, strong. Distal pulses palpable. Patient reports she does take medications as prescribed. Patient states she follows a Regular diet. The patient reports no specific efforts to gain or lose weight.. Patient's weight will be monitored closely. Demonstration and practice of PLB using pulse oximeter. Patient able to return demonstration satisfactorily. Safety and hand hygiene in the exercise area reviewed with patient. Patient voices understanding of the information reviewed. Department expectations discussed with patient and achievable goals were set. The patient shows enthusiasm about attending the program and we look forward to working with this nice lady. The patient is  to begin  exercise on 10/07/17 in the 1:30 class.   45 minutes was spent on a variety of activities such as assessment of the patient, obtaining baseline data including height, weight, BMI, and grip strength, verifying medical history, allergies, and current medications, and teaching patient strategies for performing tasks with less respiratory effort with emphasis on pursed lip breathing.

## 2017-10-06 ENCOUNTER — Encounter (HOSPITAL_COMMUNITY): Payer: Self-pay | Admitting: *Deleted

## 2017-10-07 ENCOUNTER — Encounter (HOSPITAL_COMMUNITY)
Admission: RE | Admit: 2017-10-07 | Discharge: 2017-10-07 | Disposition: A | Discharge status: Home / Self Care | Payer: Medicare Other | Source: Ambulatory Visit | Attending: Pulmonary Disease | Admitting: Pulmonary Disease

## 2017-10-07 VITALS — Wt 132.9 lb

## 2017-10-07 DIAGNOSIS — Z85118 Personal history of other malignant neoplasm of bronchus and lung: Secondary | ICD-10-CM | POA: Diagnosis not present

## 2017-10-07 DIAGNOSIS — Z79899 Other long term (current) drug therapy: Secondary | ICD-10-CM | POA: Diagnosis not present

## 2017-10-07 DIAGNOSIS — Z87891 Personal history of nicotine dependence: Secondary | ICD-10-CM | POA: Diagnosis not present

## 2017-10-07 DIAGNOSIS — I10 Essential (primary) hypertension: Secondary | ICD-10-CM | POA: Diagnosis not present

## 2017-10-07 DIAGNOSIS — J8410 Pulmonary fibrosis, unspecified: Secondary | ICD-10-CM | POA: Diagnosis not present

## 2017-10-07 DIAGNOSIS — E785 Hyperlipidemia, unspecified: Secondary | ICD-10-CM | POA: Diagnosis not present

## 2017-10-07 NOTE — Progress Notes (Signed)
Daily Session Note  Patient Details  Name: [name] [name] MRN: [id number] Date of Birth: 02-Feb-1941 Referring Provider:     PULMONARY REHAB OTHER RESP ORIENTATION from 10/05/2017 in [place]  Referring Provider  Dr. Elsworth Soho      Encounter Date: 10/07/2017  Check In: Session Check In - 10/07/17 1330      Check-In   Location  [facility]    Staff Present  Rosebud Poles, RN, BSN;Molly diVincenzo, MS, ACSM RCEP, Exercise Physiologist;Derwood Becraft Rollene Rotunda, RN, BSN    Supervising physician immediately available to respond to emergencies  [facility] immediately available    Physician(s)  Dr. Quincy Simmonds    Medication changes reported      No    Fall or balance concerns reported     No    Tobacco Cessation  No Change    Warm-up and Cool-down  Performed as group-led instruction    Resistance Training Performed  Yes    VAD Patient?  No      Pain Assessment   Currently in Pain?  No/denies    Multiple Pain Sites  No       Capillary Blood Glucose: No results found for this or any previous visit (from the past 24 hour(s)).  Exercise Prescription Changes - 10/07/17 1500      Response to Exercise   Blood Pressure (Admit)  140/70    Blood Pressure (Exercise)  140/60    Blood Pressure (Exit)  110/60    Heart Rate (Admit)  100 bpm    Heart Rate (Exercise)  125 bpm    Heart Rate (Exit)  100 bpm    Oxygen Saturation (Admit)  96 %    Oxygen Saturation (Exercise)  91 %    Oxygen Saturation (Exit)  97 %    Rating of Perceived Exertion (Exercise)  15    Perceived Dyspnea (Exercise)  1    Duration  Progress to 45 minutes of aerobic exercise without signs/symptoms of physical distress    Intensity  Other (comment) HRR 40-80%   HRR 40-80%     Resistance Training   Training Prescription  Yes    Weight  orange bands    Reps  10-15      Oxygen   Oxygen  Continuous    Liters  2      Bike   Level  0.4    Minutes  17      Track   Laps  6    Minutes  17       Social History   Tobacco Use  Smoking Status Former Smoker  . Packs/day: 1.00  . Years: 40.00  . Pack years: 40.00  . Types: Cigarettes  . Last attempt to quit: 11/03/2012  . Years since quitting: 4.9  Smokeless Tobacco Never Used    Goals Met:  Using PLB without cueing & demonstrates good technique Exercise tolerated well No report of cardiac concerns or symptoms Strength training completed today  Goals Unmet:  Not Applicable  Comments: Service time is from 1330 to 1530   Dr. Rush Farmer is Medical Director for Pulmonary Rehab at [facility].

## 2017-10-12 ENCOUNTER — Encounter (HOSPITAL_COMMUNITY)
Admission: RE | Admit: 2017-10-12 | Discharge: 2017-10-12 | Disposition: A | Discharge status: Home / Self Care | Payer: Medicare Other | Source: Ambulatory Visit | Attending: Pulmonary Disease | Admitting: Pulmonary Disease

## 2017-10-12 VITALS — Wt 132.1 lb

## 2017-10-12 DIAGNOSIS — J8410 Pulmonary fibrosis, unspecified: Secondary | ICD-10-CM

## 2017-10-12 DIAGNOSIS — Z87891 Personal history of nicotine dependence: Secondary | ICD-10-CM | POA: Diagnosis not present

## 2017-10-12 DIAGNOSIS — I10 Essential (primary) hypertension: Secondary | ICD-10-CM | POA: Diagnosis not present

## 2017-10-12 DIAGNOSIS — Z79899 Other long term (current) drug therapy: Secondary | ICD-10-CM | POA: Diagnosis not present

## 2017-10-12 DIAGNOSIS — Z85118 Personal history of other malignant neoplasm of bronchus and lung: Secondary | ICD-10-CM | POA: Diagnosis not present

## 2017-10-12 DIAGNOSIS — E785 Hyperlipidemia, unspecified: Secondary | ICD-10-CM | POA: Diagnosis not present

## 2017-10-12 NOTE — Progress Notes (Signed)
Daily Session Note  Patient Details  Name: [name] [name] MRN: [id number] Date of Birth: 26-Nov-1941 Referring Provider:     PULMONARY REHAB OTHER RESP ORIENTATION from 10/05/2017 in [place]  Referring Provider  Dr. Elsworth Soho      Encounter Date: 10/12/2017  Check In: Session Check In - 10/12/17 1329      Check-In   Location  [facility]    Staff Present  Rosebud Poles, RN, BSN;Molly diVincenzo, MS, ACSM RCEP, Exercise Physiologist;Sashay Felling Ysidro Evert, RN;Portia Rollene Rotunda, RN, BSN    Supervising physician immediately available to respond to emergencies  [facility] immediately available    Physician(s)  Dr. Maylene Roes    Medication changes reported      No    Fall or balance concerns reported     No    Tobacco Cessation  No Change    Warm-up and Cool-down  Performed as group-led instruction    Resistance Training Performed  Yes    VAD Patient?  No      Pain Assessment   Currently in Pain?  No/denies    Multiple Pain Sites  No       Capillary Blood Glucose: No results found for this or any previous visit (from the past 24 hour(s)).  Exercise Prescription Changes - 10/12/17 1500      Response to Exercise   Blood Pressure (Admit)  120/64    Blood Pressure (Exercise)  96/60    Blood Pressure (Exit)  130/60    Heart Rate (Admit)  107 bpm    Heart Rate (Exercise)  127 bpm    Heart Rate (Exit)  107 bpm    Oxygen Saturation (Admit)  92 %    Oxygen Saturation (Exercise)  92 %    Oxygen Saturation (Exit)  92 %    Rating of Perceived Exertion (Exercise)  15    Perceived Dyspnea (Exercise)  2    Duration  Progress to 45 minutes of aerobic exercise without signs/symptoms of physical distress    Intensity  THRR unchanged      Progression   Progression  Continue to progress workloads to maintain intensity without signs/symptoms of physical distress.      Resistance Training   Training Prescription  Yes    Weight  orange bands    Reps  10-15     Time  10 Minutes      Oxygen   Oxygen  Continuous    Liters  2      Bike   Level  0.4    Minutes  17      NuStep   Level  2    Minutes  17    METs  1.6      Track   Laps  7    Minutes  17       Social History   Tobacco Use  Smoking Status Former Smoker  . Packs/day: 1.00  . Years: 40.00  . Pack years: 40.00  . Types: Cigarettes  . Last attempt to quit: 11/03/2012  . Years since quitting: 4.9  Smokeless Tobacco Never Used    Goals Met:  No report of cardiac concerns or symptoms Strength training completed today  Goals Unmet:  Not Applicable  Comments: Service time is from 1330 to 1500    Dr. Rush Farmer is Medical Director for Pulmonary Rehab at [facility].

## 2017-10-14 ENCOUNTER — Encounter (HOSPITAL_COMMUNITY)
Admission: RE | Admit: 2017-10-14 | Discharge: 2017-10-14 | Disposition: A | Discharge status: Home / Self Care | Payer: Medicare Other | Source: Ambulatory Visit

## 2017-10-14 ENCOUNTER — Encounter (HOSPITAL_COMMUNITY): Payer: Medicare Other

## 2017-10-14 DIAGNOSIS — J8410 Pulmonary fibrosis, unspecified: Secondary | ICD-10-CM

## 2017-10-14 NOTE — Progress Notes (Signed)
Pulmonary Individual Treatment Plan  Patient Details  Name: [name] [name] MRN: [id number] Date of Birth: [birth year] Referring Provider:     PULMONARY REHAB OTHER RESP ORIENTATION from 10/05/2017 in [place]  Referring Provider  Dr. Elsworth Soho      Initial Encounter Date:    [place] from 10/05/2017 in [place]  Date  10/05/17  Referring Provider  Dr. Elsworth Soho      Visit Diagnosis: Pulmonary fibrosis ([place])  Patient's Home Medications on Admission:   Current Outpatient Medications:  .  acetaminophen (TYLENOL) 500 MG tablet, Take 1,000 mg by mouth every 6 (six) hours as needed for pain., Disp: , Rfl:  .  amLODipine (NORVASC) 10 MG tablet, Take 1 tablet (10 mg total) by mouth daily., Disp: 30 tablet, Rfl: 0 .  atorvastatin (LIPITOR) 40 MG tablet, Take 20 mg by mouth every morning., Disp: , Rfl:  .  hydrochlorothiazide (HYDRODIURIL) 25 MG tablet, Take 25 mg by mouth daily before breakfast. , Disp: , Rfl:  .  Hypromellose (ARTIFICIAL TEARS OP), Place 1 drop into both eyes daily., Disp: , Rfl:  .  loratadine (CLARITIN) 10 MG tablet, Take 10 mg by mouth daily., Disp: , Rfl:  .  predniSONE (DELTASONE) 10 MG tablet, 6 tabs x1d f/b 5tabsx3d, f/b 4tabs x 3d, f/b 3tabx3d, f/b 2 tab x 3d, f/b 1tabx3d & DC, Disp: 51 tablet, Rfl: 0  Past Medical History: Past Medical History:  Diagnosis Date  . Anxiety   . Arthritis    a. 10/2012 s/p Right THA.  Marland Kitchen Arthritis    "hands" (09/21/2017)  . Chronic lower back pain   . Complication of anesthesia    "couldn't wake me up after my hip surgery" (09/21/2017)  . Depression   . History of ARDS   . Hyperlipidemia   . Hypertension   . Lung cancer ([place]) 02/16/13   LUL lung SQUAMOUS CELL  . Lung cancer ([place]) dx'd 12/2013    LLL ADENOCARCINOMA  . Pulmonary fibrosis ([place]) dx'd ~ 07/2017  . Pulmonary nodule    a. 10/2012 CT: 54m anterior LUL nodule w/ spiculated  appearance - rec PET CT.  . Seasonal allergies    "I take Claritin" (09/21/2017)  . Sinus headache     Tobacco Use: Social History   Tobacco Use  Smoking Status Former Smoker  . Packs/day: 1.00  . Years: 40.00  . Pack years: 40.00  . Types: Cigarettes  . Last attempt to quit: 11/03/2012  . Years since quitting: 4.9  Smokeless Tobacco Never Used    Labs: Recent Review Flowsheet Data    Labs for ITP Cardiac and Pulmonary Rehab Latest Ref Rng & Units 11/11/2012 11/12/2012 11/12/2012 11/12/2012 11/12/2012   Trlycerides <150 mg/dL - - - - -   PHART 7.350 - 7.450 7.374 7.392 7.405 7.512(H) 7.473(H)   PCO2ART 35.0 - 45.0 mmHg 31.1(L) 51.1(H) 53.7(H) 47.8(H) 53.4(H)   HCO3 20.0 - 24.0 mEq/L 17.7(L) 30.8(H) 32.9(H) 38.1(H) 38.6(H)   TCO2 0 - 100 mmol/L 17.3 28.3 30.6 34.6 35.3   ACIDBASEDEF 0.0 - 2.0 mmol/L 6.4(H) - - - -   O2SAT % 94.2 93.3 94.4 98.8 99.5      Capillary Blood Glucose: Lab Results  Component Value Date   GLUCAP 98 09/24/2017   GLUCAP 100 (H) 09/24/2017   GLUCAP 142 (H) 09/23/2017   GLUCAP 131 (H) 09/23/2017   GLUCAP 125 (H) 09/23/2017     Pulmonary Assessment  Scores: Pulmonary Assessment Scores    Row Name 10/06/17 1024         ADL UCSD   ADL Phase  Entry     SOB Score total  42       CAT Score   CAT Score  17 Entry        Pulmonary Function Assessment: Pulmonary Function Assessment - 10/05/17 0909      Breath   Bilateral Breath Sounds  Expiratory;Wheezes fine crackles in bases       Exercise Target Goals:    Exercise Program Goal: Individual exercise prescription set with THRR, safety & activity barriers. Participant demonstrates ability to understand and report RPE using BORG scale, to self-measure pulse accurately, and to acknowledge the importance of the exercise prescription.  Exercise Prescription Goal: Starting with aerobic activity 30 plus minutes a day, 3 days per week for initial exercise prescription. Provide home exercise  prescription and guidelines that participant acknowledges understanding prior to discharge.  Activity Barriers & Risk Stratification: Activity Barriers & Cardiac Risk Stratification - 10/05/17 0851      Activity Barriers & Cardiac Risk Stratification   Activity Barriers  Shortness of Breath;Deconditioning;Back Problems       6 Minute Walk: 6 Minute Walk    Row Name 09/21/17 0941         6 Minute Walk   Phase  Initial     Distance  400 feet     Walk Time  2 minutes     # of Rest Breaks  0     Symptoms  Yes (comment)     Comments  Walk test terminated after 2 laps (400 feet) due to desaturations     Resting HR  108 bpm     Resting BP  124/62     Resting Oxygen Saturation   74 % room air     Exercise Oxygen Saturation  during 6 min walk  83 % 3 liters       Interval Oxygen   Interval Oxygen?  Yes     Baseline Oxygen Saturation %  74 %     1 Minute Oxygen Saturation %  92 %     1 Minute Liters of Oxygen  2 L     2 Minute Oxygen Saturation %  83 %     2 Minute Liters of Oxygen  3 L        Oxygen Initial Assessment: Oxygen Initial Assessment - 10/05/17 1319      Initial 6 min Walk   Oxygen Used  Continuous;E-Tanks    Liters per minute  2      Program Oxygen Prescription   Program Oxygen Prescription  E-Tanks;Continuous    Liters per minute  2       Oxygen Re-Evaluation: Oxygen Re-Evaluation    Row Name 10/12/17 0951             Program Oxygen Prescription   Program Oxygen Prescription  E-Tanks;Continuous       Liters per minute  2         Home Oxygen   Home Oxygen Device  Portable Concentrator;E-Tanks       Sleep Oxygen Prescription  Continuous       Liters per minute  2       Home Exercise Oxygen Prescription  Continuous       Liters per minute  2       Home at Rest Exercise Oxygen Prescription  None  Compliance with Home Oxygen Use  Yes         Goals/Expected Outcomes   Short Term Goals  To learn and exhibit compliance with exercise, home and  travel O2 prescription;To learn and understand importance of monitoring SPO2 with pulse oximeter and demonstrate accurate use of the pulse oximeter.;To learn and understand importance of maintaining oxygen saturations>88%;To learn and demonstrate proper pursed lip breathing techniques or other breathing techniques.;To learn and demonstrate proper use of respiratory medications       Long  Term Goals  Exhibits compliance with exercise, home and travel O2 prescription;Verbalizes importance of monitoring SPO2 with pulse oximeter and return demonstration;Maintenance of O2 saturations>88%;Exhibits proper breathing techniques, such as pursed lip breathing or other method taught during program session;Compliance with respiratory medication       Comments  Just began program, for now oxygen useage is as stated       Goals/Expected Outcomes  compliance with oxygen useage          Oxygen Discharge (Final Oxygen Re-Evaluation): Oxygen Re-Evaluation - 10/12/17 0951      Program Oxygen Prescription   Program Oxygen Prescription  E-Tanks;Continuous    Liters per minute  2      Home Oxygen   Home Oxygen Device  Portable Concentrator;E-Tanks    Sleep Oxygen Prescription  Continuous    Liters per minute  2    Home Exercise Oxygen Prescription  Continuous    Liters per minute  2    Home at Rest Exercise Oxygen Prescription  None    Compliance with Home Oxygen Use  Yes      Goals/Expected Outcomes   Short Term Goals  To learn and exhibit compliance with exercise, home and travel O2 prescription;To learn and understand importance of monitoring SPO2 with pulse oximeter and demonstrate accurate use of the pulse oximeter.;To learn and understand importance of maintaining oxygen saturations>88%;To learn and demonstrate proper pursed lip breathing techniques or other breathing techniques.;To learn and demonstrate proper use of respiratory medications    Long  Term Goals  Exhibits compliance with exercise, home  and travel O2 prescription;Verbalizes importance of monitoring SPO2 with pulse oximeter and return demonstration;Maintenance of O2 saturations>88%;Exhibits proper breathing techniques, such as pursed lip breathing or other method taught during program session;Compliance with respiratory medication    Comments  Just began program, for now oxygen useage is as stated    Goals/Expected Outcomes  compliance with oxygen useage       Initial Exercise Prescription: Initial Exercise Prescription - 10/05/17 1300      Date of Initial Exercise RX and Referring Provider   Date  10/05/17    Referring Provider  Dr. Elsworth Soho      Oxygen   Oxygen  Continuous    Liters  2      Bike   Level  0.4    Minutes  17      NuStep   Level  2    Minutes  17    METs  1.5      Track   Laps  10    Minutes  17      Prescription Details   Frequency (times per week)  2    Duration  Progress to 45 minutes of aerobic exercise without signs/symptoms of physical distress      Intensity   THRR 40-80% of Max Heartrate  58-117    Ratings of Perceived Exertion  11-13    Perceived Dyspnea  0-4  Progression   Progression  Continue progressive overload as per policy without signs/symptoms or physical distress.      Resistance Training   Training Prescription  Yes    Weight  orange bands    Reps  10-15       Perform Capillary Blood Glucose checks as needed.  Exercise Prescription Changes: Exercise Prescription Changes    Row Name 10/07/17 1500 10/12/17 1500           Response to Exercise   Blood Pressure (Admit)  140/70  120/64      Blood Pressure (Exercise)  140/60  96/60      Blood Pressure (Exit)  110/60  130/60      Heart Rate (Admit)  100 bpm  107 bpm      Heart Rate (Exercise)  125 bpm  127 bpm      Heart Rate (Exit)  100 bpm  107 bpm      Oxygen Saturation (Admit)  96 %  92 %      Oxygen Saturation (Exercise)  91 %  92 %      Oxygen Saturation (Exit)  97 %  92 %      Rating of Perceived  Exertion (Exercise)  15  15      Perceived Dyspnea (Exercise)  1  2      Duration  Progress to 45 minutes of aerobic exercise without signs/symptoms of physical distress  Progress to 45 minutes of aerobic exercise without signs/symptoms of physical distress      Intensity  Other (comment) HRR 40-80%  THRR unchanged        Progression   Progression  -  Continue to progress workloads to maintain intensity without signs/symptoms of physical distress.        Resistance Training   Training Prescription  Yes  Yes      Weight  orange bands  orange bands      Reps  10-15  10-15      Time  -  10 Minutes        Oxygen   Oxygen  Continuous  Continuous      Liters  2  2        Bike   Level  0.4  0.4      Minutes  17  17        NuStep   Level  -  2      Minutes  -  17      METs  -  1.6        Track   Laps  6  7      Minutes  17  17         Exercise Comments:   Exercise Goals and Review: Exercise Goals    Row Name 10/05/17 0854             Exercise Goals   Increase Physical Activity  Yes       Intervention  Provide advice, education, support and counseling about physical activity/exercise needs.;Develop an individualized exercise prescription for aerobic and resistive training based on initial evaluation findings, risk stratification, comorbidities and participant's personal goals.       Expected Outcomes  Achievement of increased cardiorespiratory fitness and enhanced flexibility, muscular endurance and strength shown through measurements of functional capacity and personal statement of participant.       Increase Strength and Stamina  Yes       Intervention  Provide advice, education,  support and counseling about physical activity/exercise needs.;Develop an individualized exercise prescription for aerobic and resistive training based on initial evaluation findings, risk stratification, comorbidities and participant's personal goals.       Expected Outcomes  Achievement of  increased cardiorespiratory fitness and enhanced flexibility, muscular endurance and strength shown through measurements of functional capacity and personal statement of participant.       Able to understand and use rate of perceived exertion (RPE) scale  Yes       Intervention  Provide education and explanation on how to use RPE scale       Expected Outcomes  Long Term:  Able to use RPE to guide intensity level when exercising independently       Able to understand and use Dyspnea scale  Yes       Intervention  Provide education and explanation on how to use Dyspnea scale       Expected Outcomes  Short Term: Able to use Dyspnea scale daily in rehab to express subjective sense of shortness of breath during exertion;Long Term: Able to use Dyspnea scale to guide intensity level when exercising independently       Knowledge and understanding of Target Heart Rate Range (THRR)  Yes       Intervention  Provide education and explanation of THRR including how the numbers were predicted and where they are located for reference       Expected Outcomes  Short Term: Able to state/look up THRR;Short Term: Able to use daily as guideline for intensity in rehab;Long Term: Able to use THRR to govern intensity when exercising independently       Understanding of Exercise Prescription  Yes       Intervention  Provide education, explanation, and written materials on patient's individual exercise prescription       Expected Outcomes  Short Term: Able to explain program exercise prescription;Long Term: Able to explain home exercise prescription to exercise independently          Exercise Goals Re-Evaluation : Exercise Goals Re-Evaluation    Row Name 10/11/17 1647             Exercise Goal Re-Evaluation   Exercise Goals Review  Increase Strength and Stamina;Increase Physical Activity;Able to understand and use Dyspnea scale;Able to understand and use rate of perceived exertion (RPE) scale;Knowledge and  understanding of Target Heart Rate Range (THRR);Understanding of Exercise Prescription       Comments  Patient has only attended one exercise session. Will cont. to monitor and progress as able.        Expected Outcomes  Through exercise at rehab and at home, patient will increase strength and stamina and will find that ADL's are easier to preform.           Discharge Exercise Prescription (Final Exercise Prescription Changes): Exercise Prescription Changes - 10/12/17 1500      Response to Exercise   Blood Pressure (Admit)  120/64    Blood Pressure (Exercise)  96/60    Blood Pressure (Exit)  130/60    Heart Rate (Admit)  107 bpm    Heart Rate (Exercise)  127 bpm    Heart Rate (Exit)  107 bpm    Oxygen Saturation (Admit)  92 %    Oxygen Saturation (Exercise)  92 %    Oxygen Saturation (Exit)  92 %    Rating of Perceived Exertion (Exercise)  15    Perceived Dyspnea (Exercise)  2    Duration  Progress to 45 minutes of aerobic exercise without signs/symptoms of physical distress    Intensity  THRR unchanged      Progression   Progression  Continue to progress workloads to maintain intensity without signs/symptoms of physical distress.      Resistance Training   Training Prescription  Yes    Weight  orange bands    Reps  10-15    Time  10 Minutes      Oxygen   Oxygen  Continuous    Liters  2      Bike   Level  0.4    Minutes  17      NuStep   Level  2    Minutes  17    METs  1.6      Track   Laps  7    Minutes  17       Nutrition:  Target Goals: Understanding of nutrition guidelines, daily intake of sodium <1582m, cholesterol <2056m calories 30% from fat and 7% or less from saturated fats, daily to have 5 or more servings of fruits and vegetables.  Biometrics: Pre Biometrics - 10/05/17 0930      Pre Biometrics   Grip Strength  24 kg        Nutrition Therapy Plan and Nutrition Goals:   Nutrition Discharge: Rate Your Plate Scores:   Nutrition Goals  Re-Evaluation:   Nutrition Goals Discharge (Final Nutrition Goals Re-Evaluation):   Psychosocial: Target Goals: Acknowledge presence or absence of significant depression and/or stress, maximize coping skills, provide positive support system. Participant is able to verbalize types and ability to use techniques and skills needed for reducing stress and depression.  Initial Review & Psychosocial Screening: Initial Psych Review & Screening - 10/05/17 0911      Initial Review   Current issues with  None Identified      Family Dynamics   Good Support System?  Yes      Barriers   Psychosocial barriers to participate in program  There are no identifiable barriers or psychosocial needs.      Screening Interventions   Interventions  Encouraged to exercise       Quality of Life Scores:   PHQ-9: Recent Review Flowsheet Data    [facility] 10/05/2017 05/31/2017 03/12/2016 03/12/2016 07/27/2014   Decreased Interest 0 0 0 0 0   Down, Depressed, Hopeless 0 0 0 0 0   PHQ - 2 Score 0 0 0 0 0   Altered sleeping 0 - - - -   Tired, decreased energy 0 - - - -   Change in appetite 0 - - - -   Feeling bad or failure about yourself  0 - - - -   Trouble concentrating 0 - - - -   Moving slowly or fidgety/restless 0 - - - -   Suicidal thoughts 0 - - - -   PHQ-9 Score 0 - - - -   Difficult doing work/chores Not difficult at all - - - -     Interpretation of Total Score  Total Score Depression Severity:  1-4 = Minimal depression, 5-9 = Mild depression, 10-14 = Moderate depression, 15-19 = Moderately severe depression, 20-27 = Severe depression   Psychosocial Evaluation and Intervention: Psychosocial Evaluation - 10/05/17 0912      Psychosocial Evaluation & Interventions   Interventions  Encouraged to exercise with the program and follow exercise prescription    Expected Outcomes  patient will remain free  from psychosocial barriers to participation in pulmonary rehab    Continue  Psychosocial Services   No Follow up required       Psychosocial Re-Evaluation: Psychosocial Re-Evaluation    [place] Name 10/12/17 [phone]             Psychosocial Re-Evaluation   Current issues with  None Identified       Interventions  Encouraged to attend Pulmonary Rehabilitation for the exercise       Continue Psychosocial Services   No Follow up required          Psychosocial Discharge (Final Psychosocial Re-Evaluation): Psychosocial Re-Evaluation - 10/12/17 0955      Psychosocial Re-Evaluation   Current issues with  None Identified    Interventions  Encouraged to attend Pulmonary Rehabilitation for the exercise    Continue Psychosocial Services   No Follow up required       Education: Education Goals: Education classes will be provided on a weekly basis, covering required topics. Participant will state understanding/return demonstration of topics presented.  Learning Barriers/Preferences: Learning Barriers/Preferences - 10/05/17 9604      Learning Barriers/Preferences   Learning Barriers  None    Learning Preferences  Computer/Internet;Group Instruction;Individual Instruction;Skilled Demonstration;Verbal Instruction;Video;Written Material       Education Topics: Risk Factor Reduction:  -Group instruction that is supported by a PowerPoint presentation. Instructor discusses the definition of a risk factor, different risk factors for pulmonary disease, and how the heart and lungs work together.     Nutrition for Pulmonary Patient:  -Group instruction provided by PowerPoint slides, verbal discussion, and written materials to support subject matter. The instructor gives an explanation and review of healthy diet recommendations, which includes a discussion on weight management, recommendations for fruit and vegetable consumption, as well as protein, fluid, caffeine, fiber, sodium, sugar, and alcohol. Tips for eating when patients are short of breath are discussed.   Pursed  Lip Breathing:  -Group instruction that is supported by demonstration and informational handouts. Instructor discusses the benefits of pursed lip and diaphragmatic breathing and detailed demonstration on how to preform both.     Oxygen Safety:  -Group instruction provided by PowerPoint, verbal discussion, and written material to support subject matter. There is an overview of "What is Oxygen" and "Why do we need it".  Instructor also reviews how to create a safe environment for oxygen use, the importance of using oxygen as prescribed, and the risks of noncompliance. There is a brief discussion on traveling with oxygen and resources the patient may utilize.   Oxygen Equipment:  -Group instruction provided by [facility] Staff utilizing handouts, written materials, and equipment demonstrations.   Signs and Symptoms:  -Group instruction provided by written material and verbal discussion to support subject matter. Warning signs and symptoms of infection, stroke, and heart attack are reviewed and when to call the physician/911 reinforced. Tips for preventing the spread of infection discussed.   Advanced Directives:  -Group instruction provided by verbal instruction and written material to support subject matter. Instructor reviews Advanced Directive laws and proper instruction for filling out document.   Pulmonary Video:  -Group video education that reviews the importance of medication and oxygen compliance, exercise, good nutrition, pulmonary hygiene, and pursed lip and diaphragmatic breathing for the pulmonary patient.   Exercise for the Pulmonary Patient:  -Group instruction that is supported by a PowerPoint presentation. Instructor discusses benefits of exercise, core components of exercise, frequency, duration, and intensity of an exercise routine, importance of utilizing pulse  oximetry during exercise, safety while exercising, and options of places to exercise outside of rehab.      Pulmonary Medications:  -Verbally interactive group education provided by instructor with focus on inhaled medications and proper administration.   Anatomy and Physiology of the Respiratory System and Intimacy:  -Group instruction provided by PowerPoint, verbal discussion, and written material to support subject matter. Instructor reviews respiratory cycle and anatomical components of the respiratory system and their functions. Instructor also reviews differences in obstructive and restrictive respiratory diseases with examples of each. Intimacy, Sex, and Sexuality differences are reviewed with a discussion on how relationships can change when diagnosed with pulmonary disease. Common sexual concerns are reviewed.   MD DAY -A group question and answer session with a medical doctor that allows participants to ask questions that relate to their pulmonary disease state.   OTHER EDUCATION -Group or individual verbal, written, or video instructions that support the educational goals of the pulmonary rehab program.   PULMONARY REHAB OTHER RESPIRATORY from 10/07/2017 in [place]  Date  10/07/17 [[facility]]  Educator  Parke Simmers  Instruction Review Code  1- Verbalizes Understanding      Knowledge Questionnaire Score: Knowledge Questionnaire Score - 10/05/17 0909      Knowledge Questionnaire Score   Pre Score  11/13       Core Components/Risk Factors/Patient Goals at Admission: Personal Goals and Risk Factors at Admission - 10/05/17 0910      Core Components/Risk Factors/Patient Goals on Admission   Improve shortness of breath with ADL's  Yes    Intervention  Provide education, individualized exercise plan and daily activity instruction to help decrease symptoms of SOB with activities of daily living.    Expected Outcomes  Short Term: Achieves a reduction of symptoms when performing activities of daily living.    Develop more efficient breathing  techniques such as purse lipped breathing and diaphragmatic breathing; and practicing self-pacing with activity  Yes    Intervention  Provide education, demonstration and support about specific breathing techniuqes utilized for more efficient breathing. Include techniques such as pursed lipped breathing, diaphragmatic breathing and self-pacing activity.    Expected Outcomes  Short Term: Participant will be able to demonstrate and use breathing techniques as needed throughout daily activities.    Increase knowledge of respiratory medications and ability to use respiratory devices properly   Yes    Intervention  Provide education and demonstration as needed of appropriate use of medications, inhalers, and oxygen therapy.    Expected Outcomes  Short Term: Achieves understanding of medications use. Understands that oxygen is a medication prescribed by physician. Demonstrates appropriate use of inhaler and oxygen therapy.       Core Components/Risk Factors/Patient Goals Review:  Goals and Risk Factor Review    Row Name 10/12/17 0954             Core Components/Risk Factors/Patient Goals Review   Personal Goals Review  Improve shortness of breath with ADL's;Increase knowledge of respiratory medications and ability to use respiratory devices properly.;Develop more efficient breathing techniques such as purse lipped breathing and diaphragmatic breathing and practicing self-pacing with activity.       Review  Has only attended 1 exercise session, too early to see progression toward goals       Expected Outcomes  Should see progression of goals stated in the next 30 days          Core Components/Risk Factors/Patient Goals at Discharge (Final Review):  Goals and Risk Factor Review - 10/12/17 0954      Core Components/Risk Factors/Patient Goals Review   Personal Goals Review  Improve shortness of breath with ADL's;Increase knowledge of respiratory medications and ability to use respiratory devices  properly.;Develop more efficient breathing techniques such as purse lipped breathing and diaphragmatic breathing and practicing self-pacing with activity.    Review  Has only attended 1 exercise session, too early to see progression toward goals    Expected Outcomes  Should see progression of goals stated in the next 30 days       ITP Comments:   Comments: ITP REVIEW Pt is making expected progress toward pulmonary rehab goals after completing 2 sessions. Recommend continued exercise, life style modification, education, and utilization of breathing techniques to increase stamina and strength and decrease shortness of breath with exertion.

## 2017-10-19 ENCOUNTER — Encounter (HOSPITAL_COMMUNITY)
Admission: RE | Admit: 2017-10-19 | Discharge: 2017-10-19 | Disposition: A | Discharge status: Home / Self Care | Payer: Medicare Other | Source: Ambulatory Visit | Attending: Pulmonary Disease | Admitting: Pulmonary Disease

## 2017-10-19 ENCOUNTER — Encounter (HOSPITAL_COMMUNITY): Payer: Medicare Other

## 2017-10-19 DIAGNOSIS — E785 Hyperlipidemia, unspecified: Secondary | ICD-10-CM | POA: Diagnosis not present

## 2017-10-19 DIAGNOSIS — Z87891 Personal history of nicotine dependence: Secondary | ICD-10-CM | POA: Diagnosis not present

## 2017-10-19 DIAGNOSIS — J8410 Pulmonary fibrosis, unspecified: Secondary | ICD-10-CM | POA: Diagnosis not present

## 2017-10-19 DIAGNOSIS — Z79899 Other long term (current) drug therapy: Secondary | ICD-10-CM | POA: Diagnosis not present

## 2017-10-19 DIAGNOSIS — I10 Essential (primary) hypertension: Secondary | ICD-10-CM | POA: Diagnosis not present

## 2017-10-19 DIAGNOSIS — Z85118 Personal history of other malignant neoplasm of bronchus and lung: Secondary | ICD-10-CM | POA: Diagnosis not present

## 2017-10-19 NOTE — Progress Notes (Signed)
Daily Session Note  Patient Details  Name: [name] [name] MRN: [id number] Date of Birth: 05/19/1941 Referring Provider:     PULMONARY REHAB OTHER RESP ORIENTATION from 10/05/2017 in [place]  Referring Provider  Dr. Elsworth Soho      Encounter Date: 10/19/2017  Check In: Session Check In - 10/19/17 1513      Check-In   Location  [facility]    Staff Present  Rosebud Poles, RN, Luisa Hart, RN, BSN;Molly diVincenzo, MS, ACSM RCEP, Exercise Physiologist    Supervising physician immediately available to respond to emergencies  [facility] immediately available    Physician(s)  Dr. Dannielle Karvonen    Medication changes reported      No    Fall or balance concerns reported     No    Tobacco Cessation  No Change    Warm-up and Cool-down  Performed on first and last piece of equipment    Resistance Training Performed  Yes    VAD Patient?  No      Pain Assessment   Currently in Pain?  No/denies    Multiple Pain Sites  No       Capillary Blood Glucose: No results found for this or any previous visit (from the past 24 hour(s)).  Exercise Prescription Changes - 10/19/17 1500      Response to Exercise   Blood Pressure (Admit)  128/52    Blood Pressure (Exercise)  124/70    Blood Pressure (Exit)  130/60    Heart Rate (Admit)  100 bpm    Heart Rate (Exercise)  119 bpm    Heart Rate (Exit)  104 bpm    Oxygen Saturation (Admit)  94 %    Oxygen Saturation (Exercise)  94 %    Oxygen Saturation (Exit)  98 %    Rating of Perceived Exertion (Exercise)  15    Perceived Dyspnea (Exercise)  1    Duration  Progress to 45 minutes of aerobic exercise without signs/symptoms of physical distress    Intensity  THRR unchanged      Progression   Progression  Continue to progress workloads to maintain intensity without signs/symptoms of physical distress.      Resistance Training   Training Prescription  Yes    Weight  orange bands    Reps  10-15     Time  10 Minutes      Oxygen   Oxygen  Continuous    Liters  2      Bike   Level  0.4    Minutes  17      NuStep   Level  3    Minutes  17    METs  1.9      Track   Laps  5    Minutes  17       Social History   Tobacco Use  Smoking Status Former Smoker  . Packs/day: 1.00  . Years: 40.00  . Pack years: 40.00  . Types: Cigarettes  . Last attempt to quit: 11/03/2012  . Years since quitting: 4.9  Smokeless Tobacco Never Used    Goals Met:  Exercise tolerated well No report of cardiac concerns or symptoms Strength training completed today  Goals Unmet:  Not Applicable  Comments: Service time is from 1330 to 1500    Dr. Rush Farmer is Medical Director for Pulmonary Rehab at [facility].

## 2017-10-26 ENCOUNTER — Encounter (HOSPITAL_COMMUNITY)
Admission: RE | Admit: 2017-10-26 | Discharge: 2017-10-26 | Disposition: A | Discharge status: Home / Self Care | Payer: Medicare Other | Source: Ambulatory Visit | Attending: Pulmonary Disease | Admitting: Pulmonary Disease

## 2017-10-26 ENCOUNTER — Encounter (HOSPITAL_COMMUNITY): Payer: Medicare Other

## 2017-10-26 ENCOUNTER — Telehealth: Payer: Self-pay | Admitting: Pulmonary Disease

## 2017-10-26 VITALS — Wt 135.4 lb

## 2017-10-26 DIAGNOSIS — I10 Essential (primary) hypertension: Secondary | ICD-10-CM | POA: Diagnosis not present

## 2017-10-26 DIAGNOSIS — E785 Hyperlipidemia, unspecified: Secondary | ICD-10-CM | POA: Diagnosis not present

## 2017-10-26 DIAGNOSIS — Z87891 Personal history of nicotine dependence: Secondary | ICD-10-CM | POA: Diagnosis not present

## 2017-10-26 DIAGNOSIS — J8410 Pulmonary fibrosis, unspecified: Secondary | ICD-10-CM | POA: Diagnosis not present

## 2017-10-26 DIAGNOSIS — Z85118 Personal history of other malignant neoplasm of bronchus and lung: Secondary | ICD-10-CM | POA: Diagnosis not present

## 2017-10-26 DIAGNOSIS — Z79899 Other long term (current) drug therapy: Secondary | ICD-10-CM | POA: Diagnosis not present

## 2017-10-26 NOTE — Telephone Encounter (Signed)
What are her oxygen requirements? Okay for POC

## 2017-10-26 NOTE — Telephone Encounter (Signed)
Will call pulmonary rehab back tomorrow for more information as they are currently closed right now.

## 2017-10-26 NOTE — Telephone Encounter (Signed)
RA ok for pt to be evaluated for POC. Please advise.

## 2017-10-26 NOTE — Progress Notes (Signed)
Daily Session Note  Patient Details  Name: [name] [name] MRN: [id number] Date of Birth: [birth year] Referring Provider:     PULMONARY REHAB OTHER RESP ORIENTATION from 10/05/2017 in [place]  Referring Provider  Dr. Elsworth Soho      Encounter Date: 10/26/2017  Check In: Session Check In - 10/26/17 1330      Check-In   Location  [facility]    Staff Present  Rosebud Poles, RN, BSN;Molly diVincenzo, MS, ACSM RCEP, Exercise Physiologist;Lisa Ysidro Evert, RN;Portia Rollene Rotunda, RN, BSN    Supervising physician immediately available to respond to emergencies  [facility] immediately available    Physician(s)  Dr. Dannielle Karvonen    Medication changes reported      No    Fall or balance concerns reported     No    Tobacco Cessation  No Change    Warm-up and Cool-down  Performed as group-led instruction    Resistance Training Performed  Yes    VAD Patient?  No      Pain Assessment   Currently in Pain?  No/denies    Pain Score  0-No pain    Multiple Pain Sites  No       Capillary Blood Glucose: No results found for this or any previous visit (from the past 24 hour(s)).  Exercise Prescription Changes - 10/26/17 1500      Response to Exercise   Blood Pressure (Admit)  128/68    Blood Pressure (Exercise)  132/70    Blood Pressure (Exit)  122/54    Heart Rate (Admit)  107 bpm    Heart Rate (Exercise)  111 bpm    Heart Rate (Exit)  102 bpm    Oxygen Saturation (Admit)  97 %    Oxygen Saturation (Exercise)  92 %    Oxygen Saturation (Exit)  98 %    Rating of Perceived Exertion (Exercise)  13    Perceived Dyspnea (Exercise)  1    Duration  Progress to 45 minutes of aerobic exercise without signs/symptoms of physical distress    Intensity  THRR unchanged      Progression   Progression  Continue to progress workloads to maintain intensity without signs/symptoms of physical distress.      Resistance Training   Training Prescription  Yes    Weight   orange bands    Reps  10-15    Time  10 Minutes      Oxygen   Oxygen  Continuous    Liters  2      Bike   Level  0.4    Minutes  17      NuStep   Level  3    Minutes  17    METs  2.2      Track   Laps  4    Minutes  17       Social History   Tobacco Use  Smoking Status Former Smoker  . Packs/day: 1.00  . Years: 40.00  . Pack years: 40.00  . Types: Cigarettes  . Last attempt to quit: 11/03/2012  . Years since quitting: 4.9  Smokeless Tobacco Never Used    Goals Met:  Exercise tolerated well Strength training completed today  Goals Unmet:  Not Applicable  Comments: Service time is from 1330 to 1500    Dr. Rush Farmer is Medical Director for Pulmonary Rehab at [facility].

## 2017-10-27 NOTE — Telephone Encounter (Signed)
Spoke with Thayer Headings with pulm rehab, Thayer Headings states that pulmonary department does not work on Wednesday's. I have left message requesting call back.

## 2017-10-28 ENCOUNTER — Encounter (HOSPITAL_COMMUNITY): Payer: Medicare Other

## 2017-10-28 ENCOUNTER — Encounter (HOSPITAL_COMMUNITY)
Admission: RE | Admit: 2017-10-28 | Discharge: 2017-10-28 | Disposition: A | Discharge status: Home / Self Care | Payer: Medicare Other | Source: Ambulatory Visit | Attending: Pulmonary Disease | Admitting: Pulmonary Disease

## 2017-10-28 VITALS — Wt 134.5 lb

## 2017-10-28 DIAGNOSIS — I10 Essential (primary) hypertension: Secondary | ICD-10-CM | POA: Diagnosis not present

## 2017-10-28 DIAGNOSIS — E785 Hyperlipidemia, unspecified: Secondary | ICD-10-CM | POA: Diagnosis not present

## 2017-10-28 DIAGNOSIS — Z79899 Other long term (current) drug therapy: Secondary | ICD-10-CM | POA: Diagnosis not present

## 2017-10-28 DIAGNOSIS — J8410 Pulmonary fibrosis, unspecified: Secondary | ICD-10-CM

## 2017-10-28 DIAGNOSIS — Z85118 Personal history of other malignant neoplasm of bronchus and lung: Secondary | ICD-10-CM | POA: Diagnosis not present

## 2017-10-28 DIAGNOSIS — Z87891 Personal history of nicotine dependence: Secondary | ICD-10-CM | POA: Diagnosis not present

## 2017-10-28 NOTE — Telephone Encounter (Signed)
lmtcb x1 for [facility] with pulm rehab

## 2017-10-28 NOTE — Telephone Encounter (Signed)
Holly from Pulmonary Rehab returning call.  [phone]

## 2017-10-28 NOTE — Telephone Encounter (Signed)
ATC Holly. LM to call back to discuss.

## 2017-10-28 NOTE — Progress Notes (Signed)
I have reviewed a Home Exercise Prescription with [name] [name] . [name] [name] is not currently exercising at home.  The patient was advised to walk 2 days a week for 30 minutes. The patient will also take a chair yoga class one day a week at [facility].  [name] [name] and I discussed how to progress their exercise prescription.  The patient stated that their goals were to be more educated on her disease and increase walking endurance.  The patient stated that they understand the exercise prescription.  We reviewed exercise guidelines, target heart rate during exercise, oxygen use, weather, home pulse oximeter, endpoints for exercise, and goals.  Patient is encouraged to come to me with any questions. I will continue to follow up with the patient to assist them with progression and safety. I have requested that she be evaluated for a POC, since she is still very active.

## 2017-10-28 NOTE — Progress Notes (Signed)
Daily Session Note  Patient Details  Name: [name] [name] MRN: [id number] Date of Birth: 1941-01-07 Referring Provider:     PULMONARY REHAB OTHER RESP ORIENTATION from 10/05/2017 in [place]  Referring Provider  Dr. Elsworth Soho      Encounter Date: 10/28/2017  Check In: Session Check In - 10/28/17 1330      Check-In   Location  [facility]    Staff Present  Rosebud Poles, RN, BSN;Molly diVincenzo, MS, ACSM RCEP, Exercise Physiologist;Lisa Ysidro Evert, RN;Lacheryl Niesen Rollene Rotunda, RN, BSN    Supervising physician immediately available to respond to emergencies  [facility] immediately available    Physician(s)  Dr. Nevada Crane    Medication changes reported      No    Fall or balance concerns reported     No    Tobacco Cessation  No Change    Warm-up and Cool-down  Performed as group-led instruction    Resistance Training Performed  Yes    VAD Patient?  No      Pain Assessment   Currently in Pain?  No/denies    Multiple Pain Sites  No       Capillary Blood Glucose: No results found for this or any previous visit (from the past 24 hour(s)).  Exercise Prescription Changes - 10/28/17 1554      Response to Exercise   Blood Pressure (Admit)  128/64    Blood Pressure (Exercise)  140/60    Blood Pressure (Exit)  118/64    Heart Rate (Admit)  104 bpm    Heart Rate (Exercise)  117 bpm    Heart Rate (Exit)  96 bpm    Oxygen Saturation (Admit)  96 %    Oxygen Saturation (Exercise)  91 %    Oxygen Saturation (Exit)  97 %    Rating of Perceived Exertion (Exercise)  13    Perceived Dyspnea (Exercise)  1    Duration  Progress to 45 minutes of aerobic exercise without signs/symptoms of physical distress    Intensity  THRR unchanged      Progression   Progression  Continue to progress workloads to maintain intensity without signs/symptoms of physical distress.      Resistance Training   Training Prescription  Yes    Weight  orange bands    Reps  10-15     Time  10 Minutes      Oxygen   Oxygen  Continuous    Liters  2      NuStep   Level  3    Minutes  17    METs  2.2      Track   Laps  7    Minutes  17      Home Exercise Plan   Plans to continue exercise at  Home (comment)    Frequency  Add 3 additional days to program exercise sessions.       Social History   Tobacco Use  Smoking Status Former Smoker  . Packs/day: 1.00  . Years: 40.00  . Pack years: 40.00  . Types: Cigarettes  . Last attempt to quit: 11/03/2012  . Years since quitting: 4.9  Smokeless Tobacco Never Used    Goals Met:  Exercise tolerated well Queuing for purse lip breathing No report of cardiac concerns or symptoms Strength training completed today  Goals Unmet:  Not Applicable  Comments: Service time is from 1330 to 1535   Dr. Rush Farmer is Medical Director  for Pulmonary Rehab at [facility].

## 2017-10-28 NOTE — Telephone Encounter (Signed)
Holly returning call - she can be reached at [phone]

## 2017-10-29 NOTE — Telephone Encounter (Signed)
Received call from [facility] with pulmonary rehab, per [facility] pt is using 2lpm with exertion. She states the pt is requesting to have a POC. Called and scheduled pt on the 6MWT schedule for 12.3.2018. Pt verbalized understanding and denied any further questions or concerns at this time.     Will forward to RA as FYI - pt is on 2lpm with exertion.

## 2017-10-29 NOTE — Progress Notes (Signed)
[name] [name] 76 y.o. female   DOB: 10/12/1941 MRN: [id number]          Nutrition 1. Pulmonary fibrosis ([place])    Past Medical History:  Diagnosis Date  . Anxiety   . Arthritis    a. 10/2012 s/p Right THA.  Marland Kitchen Arthritis    "hands" (09/21/2017)  . Chronic lower back pain   . Complication of anesthesia    "couldn't wake me up after my hip surgery" (09/21/2017)  . Depression   . History of ARDS   . Hyperlipidemia   . Hypertension   . Lung cancer ([place]) 02/16/13   LUL lung SQUAMOUS CELL  . Lung cancer ([place]) dx'd 12/2013    LLL ADENOCARCINOMA  . Pulmonary fibrosis ([place]) dx'd ~ 07/2017  . Pulmonary nodule    a. 10/2012 CT: 88mm anterior LUL nodule w/ spiculated appearance - rec PET CT.  . Seasonal allergies    "I take Claritin" (09/21/2017)  . Sinus headache    Meds reviewed. Deltasone noted  Ht: Ht Readings from Last 1 Encounters:  10/05/17 5\' 1"  (1.549 m)   Wt:  Wt Readings from Last 3 Encounters:  10/28/17 134 lb 7.7 oz (61 kg)  10/26/17 135 lb 5.8 oz (61.4 kg)  10/19/17 132 lb 15 oz (60.3 kg)    BMI: 25.6    Current tobacco use? No  Nutrition Diagnosis ? Food-and nutrition-related knowledge deficit related to lack of exposure to information as related to diagnosis of pulmonary disease ? Overweight related to excessive energy intake as evidenced by a BMI of 25.6  Goal(s) 1. Identify food quantities necessary to achieve wt loss of  -2# per week to a goal wt loss of 6-24 lb at graduation from pulmonary rehab. 2. Describe the benefit of including fruits, vegetables, whole grains, and low-fat dairy products in a healthy meal plan. Plan:  Pt to attend Pulmonary Nutrition class Will provide client-centered nutrition education as part of interdisciplinary care.   Monitor and evaluate progress toward nutrition goal with team.  Monitor and Evaluate progress toward nutrition goal with team.   Derek Mound, M.Ed, RD, LDN, CDE 10/29/2017 2:08 PM

## 2017-10-29 NOTE — Telephone Encounter (Signed)
ATC pt, no answer. Left message for pt to call back.  

## 2017-11-01 ENCOUNTER — Ambulatory Visit (INDEPENDENT_AMBULATORY_CARE_PROVIDER_SITE_OTHER): Payer: Medicare Other | Admitting: *Deleted

## 2017-11-01 DIAGNOSIS — J8410 Pulmonary fibrosis, unspecified: Secondary | ICD-10-CM | POA: Diagnosis not present

## 2017-11-01 NOTE — Progress Notes (Signed)
Pt came in for POC eval.

## 2017-11-02 ENCOUNTER — Encounter (HOSPITAL_COMMUNITY)
Admission: RE | Admit: 2017-11-02 | Discharge: 2017-11-02 | Disposition: A | Discharge status: Home / Self Care | Payer: Medicare Other | Source: Ambulatory Visit | Attending: Pulmonary Disease | Admitting: Pulmonary Disease

## 2017-11-02 ENCOUNTER — Telehealth: Payer: Self-pay | Admitting: Pulmonary Disease

## 2017-11-02 ENCOUNTER — Encounter (HOSPITAL_COMMUNITY): Payer: Medicare Other

## 2017-11-02 VITALS — Wt 135.8 lb

## 2017-11-02 DIAGNOSIS — I10 Essential (primary) hypertension: Secondary | ICD-10-CM | POA: Insufficient documentation

## 2017-11-02 DIAGNOSIS — J8410 Pulmonary fibrosis, unspecified: Secondary | ICD-10-CM | POA: Diagnosis not present

## 2017-11-02 DIAGNOSIS — Z79899 Other long term (current) drug therapy: Secondary | ICD-10-CM | POA: Insufficient documentation

## 2017-11-02 DIAGNOSIS — Z85118 Personal history of other malignant neoplasm of bronchus and lung: Secondary | ICD-10-CM | POA: Diagnosis not present

## 2017-11-02 DIAGNOSIS — M1990 Unspecified osteoarthritis, unspecified site: Secondary | ICD-10-CM | POA: Insufficient documentation

## 2017-11-02 DIAGNOSIS — M545 Low back pain: Secondary | ICD-10-CM | POA: Insufficient documentation

## 2017-11-02 DIAGNOSIS — Z96641 Presence of right artificial hip joint: Secondary | ICD-10-CM | POA: Diagnosis not present

## 2017-11-02 DIAGNOSIS — G8929 Other chronic pain: Secondary | ICD-10-CM | POA: Insufficient documentation

## 2017-11-02 DIAGNOSIS — E785 Hyperlipidemia, unspecified: Secondary | ICD-10-CM | POA: Diagnosis not present

## 2017-11-02 DIAGNOSIS — Z87891 Personal history of nicotine dependence: Secondary | ICD-10-CM | POA: Insufficient documentation

## 2017-11-02 NOTE — Progress Notes (Signed)
Daily Session Note  Patient Details  Name: [name] [name] MRN: [id number] Date of Birth: [birth year] Referring Provider:     PULMONARY REHAB OTHER RESP ORIENTATION from 10/05/2017 in [place]  Referring Provider  Dr. Elsworth Soho      Encounter Date: 11/02/2017  Check In: Session Check In - 11/02/17 1329      Check-In   Location  [facility]    Staff Present  Su Hilt, MS, ACSM RCEP, Exercise Physiologist;Lisa Ysidro Evert, RN;Joan Leonia Reeves, RN, Luisa Hart, RN, BSN    Supervising physician immediately available to respond to emergencies  [facility] immediately available    Physician(s)  Dr. Nevada Crane    Medication changes reported      No    Fall or balance concerns reported     No    Tobacco Cessation  No Change    Warm-up and Cool-down  Performed as group-led instruction    Resistance Training Performed  Yes    VAD Patient?  No      Pain Assessment   Currently in Pain?  No/denies    Multiple Pain Sites  No       Capillary Blood Glucose: No results found for this or any previous visit (from the past 24 hour(s)).  Exercise Prescription Changes - 11/02/17 1530      Response to Exercise   Blood Pressure (Admit)  126/60    Blood Pressure (Exercise)  144/64    Blood Pressure (Exit)  124/70    Heart Rate (Admit)  109 bpm    Heart Rate (Exercise)  118 bpm    Heart Rate (Exit)  105 bpm    Oxygen Saturation (Admit)  95 %    Oxygen Saturation (Exercise)  90 %    Oxygen Saturation (Exit)  96 %    Rating of Perceived Exertion (Exercise)  13    Perceived Dyspnea (Exercise)  2    Duration  Progress to 45 minutes of aerobic exercise without signs/symptoms of physical distress    Intensity  THRR unchanged      Progression   Progression  Continue to progress workloads to maintain intensity without signs/symptoms of physical distress.      Resistance Training   Training Prescription  Yes    Weight  orange bands    Reps  10-15     Time  10 Minutes      Oxygen   Oxygen  Continuous    Liters  2      Bike   Level  0.4    Minutes  17      NuStep   Level  4    Minutes  17    METs  2.2      Track   Laps  7    Minutes  17      Home Exercise Plan   Plans to continue exercise at  Home (comment)    Frequency  Add 3 additional days to program exercise sessions.       Social History   Tobacco Use  Smoking Status Former Smoker  . Packs/day: 1.00  . Years: 40.00  . Pack years: 40.00  . Types: Cigarettes  . Last attempt to quit: 11/03/2012  . Years since quitting: 5.0  Smokeless Tobacco Never Used    Goals Met:  Improved SOB with ADL's Using PLB without cueing & demonstrates good technique No report of cardiac concerns or symptoms Strength training completed today  Goals Unmet:  Not Applicable  Comments: Service time is from 1330 to 1510   Dr. Rush Farmer is Medical Director for Pulmonary Rehab at [facility].

## 2017-11-02 NOTE — Telephone Encounter (Signed)
Will forward message to [place] and RA as reminder.

## 2017-11-03 NOTE — Telephone Encounter (Signed)
I have the forms. Will have RA sign these on Friday when I am back in [place].

## 2017-11-04 ENCOUNTER — Encounter (HOSPITAL_COMMUNITY)
Admission: RE | Admit: 2017-11-04 | Discharge: 2017-11-04 | Disposition: A | Discharge status: Home / Self Care | Payer: Medicare Other | Source: Ambulatory Visit | Attending: Pulmonary Disease | Admitting: Pulmonary Disease

## 2017-11-04 ENCOUNTER — Encounter (HOSPITAL_COMMUNITY): Payer: Medicare Other

## 2017-11-04 VITALS — Wt 135.1 lb

## 2017-11-04 DIAGNOSIS — I10 Essential (primary) hypertension: Secondary | ICD-10-CM | POA: Diagnosis not present

## 2017-11-04 DIAGNOSIS — J8410 Pulmonary fibrosis, unspecified: Secondary | ICD-10-CM | POA: Diagnosis not present

## 2017-11-04 DIAGNOSIS — Z87891 Personal history of nicotine dependence: Secondary | ICD-10-CM | POA: Diagnosis not present

## 2017-11-04 DIAGNOSIS — Z79899 Other long term (current) drug therapy: Secondary | ICD-10-CM | POA: Diagnosis not present

## 2017-11-04 DIAGNOSIS — E785 Hyperlipidemia, unspecified: Secondary | ICD-10-CM | POA: Diagnosis not present

## 2017-11-04 DIAGNOSIS — Z85118 Personal history of other malignant neoplasm of bronchus and lung: Secondary | ICD-10-CM | POA: Diagnosis not present

## 2017-11-04 NOTE — Progress Notes (Signed)
Pulmonary Individual Treatment Plan  Patient Details  Name: Ladaija Dimino MRN: [id number] Date of Birth: Nov 30, 1941 Referring Provider:     PULMONARY REHAB OTHER RESP ORIENTATION from 10/05/2017 in [place]  Referring Provider  Dr. Elsworth Soho      Initial Encounter Date:    [place] from 10/05/2017 in [place]  Date  10/05/17  Referring Provider  Dr. Elsworth Soho      Visit Diagnosis: Pulmonary fibrosis ([place])  Patient's Home Medications on Admission:   Current Outpatient Medications:  .  acetaminophen (TYLENOL) 500 MG tablet, Take 1,000 mg by mouth every 6 (six) hours as needed for pain., Disp: , Rfl:  .  amLODipine (NORVASC) 10 MG tablet, Take 1 tablet (10 mg total) by mouth daily., Disp: 30 tablet, Rfl: 0 .  atorvastatin (LIPITOR) 40 MG tablet, Take 20 mg by mouth every morning., Disp: , Rfl:  .  hydrochlorothiazide (HYDRODIURIL) 25 MG tablet, Take 25 mg by mouth daily before breakfast. , Disp: , Rfl:  .  Hypromellose (ARTIFICIAL TEARS OP), Place 1 drop into both eyes daily., Disp: , Rfl:  .  loratadine (CLARITIN) 10 MG tablet, Take 10 mg by mouth daily., Disp: , Rfl:  .  predniSONE (DELTASONE) 10 MG tablet, 6 tabs x1d f/b 5tabsx3d, f/b 4tabs x 3d, f/b 3tabx3d, f/b 2 tab x 3d, f/b 1tabx3d & DC, Disp: 51 tablet, Rfl: 0  Past Medical History: Past Medical History:  Diagnosis Date  . Anxiety   . Arthritis    a. 10/2012 s/p Right THA.  Marland Kitchen Arthritis    "hands" (09/21/2017)  . Chronic lower back pain   . Complication of anesthesia    "couldn't wake me up after my hip surgery" (09/21/2017)  . Depression   . History of ARDS   . Hyperlipidemia   . Hypertension   . Lung cancer ([place]) 02/16/13   LUL lung SQUAMOUS CELL  . Lung cancer ([place]) dx'd 12/2013    LLL ADENOCARCINOMA  . Pulmonary fibrosis ([place]) dx'd ~ 07/2017  . Pulmonary nodule    a. 10/2012 CT: 37m anterior LUL nodule w/ spiculated  appearance - rec PET CT.  . Seasonal allergies    "I take Claritin" (09/21/2017)  . Sinus headache     Tobacco Use: Social History   Tobacco Use  Smoking Status Former Smoker  . Packs/day: 1.00  . Years: 40.00  . Pack years: 40.00  . Types: Cigarettes  . Last attempt to quit: 11/03/2012  . Years since quitting: 5.0  Smokeless Tobacco Never Used    Labs: Recent Review Flowsheet Data    Labs for ITP Cardiac and Pulmonary Rehab Latest Ref Rng & Units 11/11/2012 11/12/2012 11/12/2012 11/12/2012 11/12/2012   Trlycerides <150 mg/dL - - - - -   PHART 7.350 - 7.450 7.374 7.392 7.405 7.512(H) 7.473(H)   PCO2ART 35.0 - 45.0 mmHg 31.1(L) 51.1(H) 53.7(H) 47.8(H) 53.4(H)   HCO3 20.0 - 24.0 mEq/L 17.7(L) 30.8(H) 32.9(H) 38.1(H) 38.6(H)   TCO2 0 - 100 mmol/L 17.3 28.3 30.6 34.6 35.3   ACIDBASEDEF 0.0 - 2.0 mmol/L 6.4(H) - - - -   O2SAT % 94.2 93.3 94.4 98.8 99.5      Capillary Blood Glucose: Lab Results  Component Value Date   GLUCAP 98 09/24/2017   GLUCAP 100 (H) 09/24/2017   GLUCAP 142 (H) 09/23/2017   GLUCAP 131 (H) 09/23/2017   GLUCAP 125 (H) 09/23/2017     Pulmonary Assessment  Scores: Pulmonary Assessment Scores    Row Name 10/06/17 1024         ADL UCSD   ADL Phase  Entry     SOB Score total  42       CAT Score   CAT Score  17 Entry        Pulmonary Function Assessment: Pulmonary Function Assessment - 10/05/17 0909      Breath   Bilateral Breath Sounds  Expiratory;Wheezes fine crackles in bases       Exercise Target Goals:    Exercise Program Goal: Individual exercise prescription set with THRR, safety & activity barriers. Participant demonstrates ability to understand and report RPE using BORG scale, to self-measure pulse accurately, and to acknowledge the importance of the exercise prescription.  Exercise Prescription Goal: Starting with aerobic activity 30 plus minutes a day, 3 days per week for initial exercise prescription. Provide home exercise  prescription and guidelines that participant acknowledges understanding prior to discharge.  Activity Barriers & Risk Stratification: Activity Barriers & Cardiac Risk Stratification - 10/05/17 0851      Activity Barriers & Cardiac Risk Stratification   Activity Barriers  Shortness of Breath;Deconditioning;Back Problems       6 Minute Walk: 6 Minute Walk    Row Name 09/21/17 0941         6 Minute Walk   Phase  Initial     Distance  400 feet     Walk Time  2 minutes     # of Rest Breaks  0     Symptoms  Yes (comment)     Comments  Walk test terminated after 2 laps (400 feet) due to desaturations     Resting HR  108 bpm     Resting BP  124/62     Resting Oxygen Saturation   74 % room air     Exercise Oxygen Saturation  during 6 min walk  83 % 3 liters       Interval Oxygen   Interval Oxygen?  Yes     Baseline Oxygen Saturation %  74 %     1 Minute Oxygen Saturation %  92 %     1 Minute Liters of Oxygen  2 L     2 Minute Oxygen Saturation %  83 %     2 Minute Liters of Oxygen  3 L        Oxygen Initial Assessment: Oxygen Initial Assessment - 10/05/17 1319      Initial 6 min Walk   Oxygen Used  Continuous;E-Tanks    Liters per minute  2      Program Oxygen Prescription   Program Oxygen Prescription  E-Tanks;Continuous    Liters per minute  2       Oxygen Re-Evaluation: Oxygen Re-Evaluation    Row Name 10/12/17 0951 11/02/17 0951           Program Oxygen Prescription   Program Oxygen Prescription  E-Tanks;Continuous  Continuous;E-Tanks      Liters per minute  2  2        Home Oxygen   Home Oxygen Device  Portable Concentrator;E-Tanks  E-Tanks;Portable Concentrator      Sleep Oxygen Prescription  Continuous  Continuous      Liters per minute  2  2      Home Exercise Oxygen Prescription  Continuous  Continuous      Liters per minute  2  2      Home  at Rest Exercise Oxygen Prescription  None  None      Compliance with Home Oxygen Use  Yes  Yes         Goals/Expected Outcomes   Short Term Goals  To learn and exhibit compliance with exercise, home and travel O2 prescription;To learn and understand importance of monitoring SPO2 with pulse oximeter and demonstrate accurate use of the pulse oximeter.;To learn and understand importance of maintaining oxygen saturations>88%;To learn and demonstrate proper pursed lip breathing techniques or other breathing techniques.;To learn and demonstrate proper use of respiratory medications  To learn and exhibit compliance with exercise, home and travel O2 prescription      Long  Term Goals  Exhibits compliance with exercise, home and travel O2 prescription;Verbalizes importance of monitoring SPO2 with pulse oximeter and return demonstration;Maintenance of O2 saturations>88%;Exhibits proper breathing techniques, such as pursed lip breathing or other method taught during program session;Compliance with respiratory medication  Exhibits compliance with exercise, home and travel O2 prescription      Comments  Just began program, for now oxygen useage is as stated  compliant with oxygen useage      Goals/Expected Outcomes  compliance with oxygen useage  continued compliance         Oxygen Discharge (Final Oxygen Re-Evaluation): Oxygen Re-Evaluation - 11/02/17 0951      Program Oxygen Prescription   Program Oxygen Prescription  Continuous;E-Tanks    Liters per minute  2      Home Oxygen   Home Oxygen Device  E-Tanks;Portable Concentrator    Sleep Oxygen Prescription  Continuous    Liters per minute  2    Home Exercise Oxygen Prescription  Continuous    Liters per minute  2    Home at Rest Exercise Oxygen Prescription  None    Compliance with Home Oxygen Use  Yes      Goals/Expected Outcomes   Short Term Goals  To learn and exhibit compliance with exercise, home and travel O2 prescription    Long  Term Goals  Exhibits compliance with exercise, home and travel O2 prescription    Comments  compliant with  oxygen useage    Goals/Expected Outcomes  continued compliance       Initial Exercise Prescription: Initial Exercise Prescription - 10/05/17 1300      Date of Initial Exercise RX and Referring Provider   Date  10/05/17    Referring Provider  Dr. Elsworth Soho      Oxygen   Oxygen  Continuous    Liters  2      Bike   Level  0.4    Minutes  17      NuStep   Level  2    Minutes  17    METs  1.5      Track   Laps  10    Minutes  17      Prescription Details   Frequency (times per week)  2    Duration  Progress to 45 minutes of aerobic exercise without signs/symptoms of physical distress      Intensity   THRR 40-80% of Max Heartrate  58-117    Ratings of Perceived Exertion  11-13    Perceived Dyspnea  0-4      Progression   Progression  Continue progressive overload as per policy without signs/symptoms or physical distress.      Resistance Training   Training Prescription  Yes    Weight  orange bands    Reps  10-15       Perform Capillary Blood Glucose checks as needed.  Exercise Prescription Changes: Exercise Prescription Changes    Row Name 10/07/17 1500 10/12/17 1500 10/19/17 1500 10/26/17 1500 10/28/17 1554     Response to Exercise   Blood Pressure (Admit)  140/70  120/64  128/52  128/68  128/64   Blood Pressure (Exercise)  140/60  96/60  124/70  132/70  140/60   Blood Pressure (Exit)  110/60  130/60  130/60  122/54  118/64   Heart Rate (Admit)  100 bpm  107 bpm  100 bpm  107 bpm  104 bpm   Heart Rate (Exercise)  125 bpm  127 bpm  119 bpm  111 bpm  117 bpm   Heart Rate (Exit)  100 bpm  107 bpm  104 bpm  102 bpm  96 bpm   Oxygen Saturation (Admit)  96 %  92 %  94 %  97 %  96 %   Oxygen Saturation (Exercise)  91 %  92 %  94 %  92 %  91 %   Oxygen Saturation (Exit)  97 %  92 %  98 %  98 %  97 %   Rating of Perceived Exertion (Exercise)  _0 Perceived Dyspnea (Exercise)  _1 Duration  Progress to 45 minutes of aerobic exercise without  signs/symptoms of physical distress  Progress to 45 minutes of aerobic exercise without signs/symptoms of physical distress  Progress to 45 minutes of aerobic exercise without signs/symptoms of physical distress  Progress to 45 minutes of aerobic exercise without signs/symptoms of physical distress  Progress to 45 minutes of aerobic exercise without signs/symptoms of physical distress   Intensity  Other (comment) HRR 40-80%  THRR unchanged  THRR unchanged  THRR unchanged  THRR unchanged     Progression   Progression  -  Continue to progress workloads to maintain intensity without signs/symptoms of physical distress.  Continue to progress workloads to maintain intensity without signs/symptoms of physical distress.  Continue to progress workloads to maintain intensity without signs/symptoms of physical distress.  Continue to progress workloads to maintain intensity without signs/symptoms of physical distress.     Resistance Training   Training Prescription  Yes  Yes  Yes  Yes  Yes   Weight  orange bands  orange bands  orange bands  orange bands  orange bands   Reps  10-15  10-15  10-15  10-15  10-15   Time  -  10 Minutes  10 Minutes  10 Minutes  10 Minutes     Oxygen   Oxygen  Continuous  Continuous  Continuous  Continuous  Continuous   Liters  _2 Bike   Level  0.4  0.4  0.4  0.4  -   Minutes  _3 -     NuStep   Level  -  _4 Minutes  -  _5 METs  -  1.6  1.9  2.2  2.2     Track   Laps  _6 Minutes  _7 17  Home Exercise Plan   Plans to continue exercise at  -  -  -  Home (comment)  Home (comment)   Frequency  -  -  -  Add 3 additional days to program exercise sessions.  Add 3 additional days to program exercise sessions.   [place] Name 11/02/17 1530             Response to Exercise   Blood Pressure (Admit)  126/60       Blood Pressure (Exercise)  144/64       Blood Pressure (Exit)  124/70       Heart Rate  (Admit)  109 bpm       Heart Rate (Exercise)  118 bpm       Heart Rate (Exit)  105 bpm       Oxygen Saturation (Admit)  95 %       Oxygen Saturation (Exercise)  90 %       Oxygen Saturation (Exit)  96 %       Rating of Perceived Exertion (Exercise)  13       Perceived Dyspnea (Exercise)  2       Duration  Progress to 45 minutes of aerobic exercise without signs/symptoms of physical distress       Intensity  THRR unchanged         Progression   Progression  Continue to progress workloads to maintain intensity without signs/symptoms of physical distress.         Resistance Training   Training Prescription  Yes       Weight  orange bands       Reps  10-15       Time  10 Minutes         Oxygen   Oxygen  Continuous       Liters  2         Bike   Level  0.4       Minutes  17         NuStep   Level  4       Minutes  17       METs  2.2         Track   Laps  7       Minutes  17         Home Exercise Plan   Plans to continue exercise at  Home (comment)       Frequency  Add 3 additional days to program exercise sessions.          Exercise Comments: Exercise Comments    Row Name 10/28/17 0708           Exercise Comments  Home exercise completed          Exercise Goals and Review: Exercise Goals    Row Name 10/05/17 0854             Exercise Goals   Increase Physical Activity  Yes       Intervention  Provide advice, education, support and counseling about physical activity/exercise needs.;Develop an individualized exercise prescription for aerobic and resistive training based on initial evaluation findings, risk stratification, comorbidities and participant's personal goals.       Expected Outcomes  Achievement of increased cardiorespiratory fitness and enhanced flexibility, muscular endurance and strength shown through measurements of functional capacity and personal statement of participant.       Increase Strength and Stamina  Yes  Intervention  Provide  advice, education, support and counseling about physical activity/exercise needs.;Develop an individualized exercise prescription for aerobic and resistive training based on initial evaluation findings, risk stratification, comorbidities and participant's personal goals.       Expected Outcomes  Achievement of increased cardiorespiratory fitness and enhanced flexibility, muscular endurance and strength shown through measurements of functional capacity and personal statement of participant.       Able to understand and use rate of perceived exertion (RPE) scale  Yes       Intervention  Provide education and explanation on how to use RPE scale       Expected Outcomes  Long Term:  Able to use RPE to guide intensity level when exercising independently       Able to understand and use Dyspnea scale  Yes       Intervention  Provide education and explanation on how to use Dyspnea scale       Expected Outcomes  Short Term: Able to use Dyspnea scale daily in rehab to express subjective sense of shortness of breath during exertion;Long Term: Able to use Dyspnea scale to guide intensity level when exercising independently       Knowledge and understanding of Target Heart Rate Range (THRR)  Yes       Intervention  Provide education and explanation of THRR including how the numbers were predicted and where they are located for reference       Expected Outcomes  Short Term: Able to state/look up THRR;Short Term: Able to use daily as guideline for intensity in rehab;Long Term: Able to use THRR to govern intensity when exercising independently       Understanding of Exercise Prescription  Yes       Intervention  Provide education, explanation, and written materials on patient's individual exercise prescription       Expected Outcomes  Short Term: Able to explain program exercise prescription;Long Term: Able to explain home exercise prescription to exercise independently          Exercise Goals Re-Evaluation  : Exercise Goals Re-Evaluation    Row Name 10/11/17 1647 11/01/17 1507           Exercise Goal Re-Evaluation   Exercise Goals Review  Increase Strength and Stamina;Increase Physical Activity;Able to understand and use Dyspnea scale;Able to understand and use rate of perceived exertion (RPE) scale;Knowledge and understanding of Target Heart Rate Range (THRR);Understanding of Exercise Prescription  Increase Physical Activity;Able to understand and use rate of perceived exertion (RPE) scale;Knowledge and understanding of Target Heart Rate Range (THRR);Understanding of Exercise Prescription;Increase Strength and Stamina;Able to understand and use Dyspnea scale      Comments  Patient has only attended one exercise session. Will cont. to monitor and progress as able.   Patient is progressing slowly. Doctor has placed an order for a POC so she can more easily be active with the oxygen. Patient has only attended 5 exercise sessions--will cont. to monitor and progress as able.       Expected Outcomes  Through exercise at rehab and at home, patient will increase strength and stamina and will find that ADL's are easier to preform.   Through exercise at rehab and at home, patient will increase strength and stamina and find that ADL's are easier to preform.          Discharge Exercise Prescription (Final Exercise Prescription Changes): Exercise Prescription Changes - 11/02/17 1530      Response to Exercise   Blood  Pressure (Admit)  126/60    Blood Pressure (Exercise)  144/64    Blood Pressure (Exit)  124/70    Heart Rate (Admit)  109 bpm    Heart Rate (Exercise)  118 bpm    Heart Rate (Exit)  105 bpm    Oxygen Saturation (Admit)  95 %    Oxygen Saturation (Exercise)  90 %    Oxygen Saturation (Exit)  96 %    Rating of Perceived Exertion (Exercise)  13    Perceived Dyspnea (Exercise)  2    Duration  Progress to 45 minutes of aerobic exercise without signs/symptoms of physical distress    Intensity   THRR unchanged      Progression   Progression  Continue to progress workloads to maintain intensity without signs/symptoms of physical distress.      Resistance Training   Training Prescription  Yes    Weight  orange bands    Reps  10-15    Time  10 Minutes      Oxygen   Oxygen  Continuous    Liters  2      Bike   Level  0.4    Minutes  17      NuStep   Level  4    Minutes  17    METs  2.2      Track   Laps  7    Minutes  17      Home Exercise Plan   Plans to continue exercise at  Home (comment)    Frequency  Add 3 additional days to program exercise sessions.       Nutrition:  Target Goals: Understanding of nutrition guidelines, daily intake of sodium <1567m, cholesterol <2037m calories 30% from fat and 7% or less from saturated fats, daily to have 5 or more servings of fruits and vegetables.  Biometrics: Pre Biometrics - 10/05/17 0930      Pre Biometrics   Grip Strength  24 kg        Nutrition Therapy Plan and Nutrition Goals: Nutrition Therapy & Goals - 10/29/17 1411      Nutrition Therapy   Diet  Heart Healthy      Personal Nutrition Goals   Nutrition Goal  Identify food quantities necessary to achieve wt loss of  -2# per week to a goal wt loss of 6-24 lb at graduation from pulmonary rehab.    Personal Goal #2  Describe the benefit of including fruits, vegetables, whole grains, and low-fat dairy products in a healthy meal plan.      Intervention Plan   Intervention  Prescribe, educate and counsel regarding individualized specific dietary modifications aiming towards targeted core components such as weight, hypertension, lipid management, diabetes, heart failure and other comorbidities.    Expected Outcomes  Short Term Goal: Understand basic principles of dietary content, such as calories, fat, sodium, cholesterol and nutrients.;Long Term Goal: Adherence to prescribed nutrition plan.       Nutrition Discharge: Rate Your Plate Scores: Nutrition  Assessments - 10/29/17 1407      Rate Your Plate Scores   Pre Score  42       Nutrition Goals Re-Evaluation:   Nutrition Goals Discharge (Final Nutrition Goals Re-Evaluation):   Psychosocial: Target Goals: Acknowledge presence or absence of significant depression and/or stress, maximize coping skills, provide positive support system. Participant is able to verbalize types and ability to use techniques and skills needed for reducing stress and depression.  Initial Review &  Psychosocial Screening: Initial Psych Review & Screening - 10/05/17 0911      Initial Review   Current issues with  None Identified      Family Dynamics   Good Support System?  Yes      Barriers   Psychosocial barriers to participate in program  There are no identifiable barriers or psychosocial needs.      Screening Interventions   Interventions  Encouraged to exercise       Quality of Life Scores:   PHQ-9: Recent Review Flowsheet Data    Depression screen [facility] 2/9 10/05/2017 05/31/2017 03/12/2016 03/12/2016 07/27/2014   Decreased Interest 0 0 0 0 0   Down, Depressed, Hopeless 0 0 0 0 0   PHQ - 2 Score 0 0 0 0 0   Altered sleeping 0 - - - -   Tired, decreased energy 0 - - - -   Change in appetite 0 - - - -   Feeling bad or failure about yourself  0 - - - -   Trouble concentrating 0 - - - -   Moving slowly or fidgety/restless 0 - - - -   Suicidal thoughts 0 - - - -   PHQ-9 Score 0 - - - -   Difficult doing work/chores Not difficult at all - - - -     Interpretation of Total Score  Total Score Depression Severity:  1-4 = Minimal depression, 5-9 = Mild depression, 10-14 = Moderate depression, 15-19 = Moderately severe depression, 20-27 = Severe depression   Psychosocial Evaluation and Intervention: Psychosocial Evaluation - 10/05/17 0912      Psychosocial Evaluation & Interventions   Interventions  Encouraged to exercise with the program and follow exercise prescription    Expected Outcomes   patient will remain free from psychosocial barriers to participation in pulmonary rehab    Continue Psychosocial Services   No Follow up required       Psychosocial Re-Evaluation: Psychosocial Re-Evaluation    Row Name 10/12/17 0955 11/02/17 0956           Psychosocial Re-Evaluation   Current issues with  None Identified  None Identified      Interventions  Encouraged to attend Pulmonary Rehabilitation for the exercise  Encouraged to attend Pulmonary Rehabilitation for the exercise      Continue Psychosocial Services   No Follow up required  No Follow up required         Psychosocial Discharge (Final Psychosocial Re-Evaluation): Psychosocial Re-Evaluation - 11/02/17 0956      Psychosocial Re-Evaluation   Current issues with  None Identified    Interventions  Encouraged to attend Pulmonary Rehabilitation for the exercise    Continue Psychosocial Services   No Follow up required       Education: Education Goals: Education classes will be provided on a weekly basis, covering required topics. Participant will state understanding/return demonstration of topics presented.  Learning Barriers/Preferences: Learning Barriers/Preferences - 10/05/17 2952      Learning Barriers/Preferences   Learning Barriers  None    Learning Preferences  Computer/Internet;Group Instruction;Individual Instruction;Skilled Demonstration;Verbal Instruction;Video;Written Material       Education Topics: Risk Factor Reduction:  -Group instruction that is supported by a PowerPoint presentation. Instructor discusses the definition of a risk factor, different risk factors for pulmonary disease, and how the heart and lungs work together.     Nutrition for Pulmonary Patient:  -Group instruction provided by PowerPoint slides, verbal discussion, and written materials to support subject  matter. The instructor gives an explanation and review of healthy diet recommendations, which includes a discussion on weight  management, recommendations for fruit and vegetable consumption, as well as protein, fluid, caffeine, fiber, sodium, sugar, and alcohol. Tips for eating when patients are short of breath are discussed.   Pursed Lip Breathing:  -Group instruction that is supported by demonstration and informational handouts. Instructor discusses the benefits of pursed lip and diaphragmatic breathing and detailed demonstration on how to preform both.     Oxygen Safety:  -Group instruction provided by PowerPoint, verbal discussion, and written material to support subject matter. There is an overview of "What is Oxygen" and "Why do we need it".  Instructor also reviews how to create a safe environment for oxygen use, the importance of using oxygen as prescribed, and the risks of noncompliance. There is a brief discussion on traveling with oxygen and resources the patient may utilize.   Oxygen Equipment:  -Group instruction provided by [facility] Staff utilizing handouts, written materials, and equipment demonstrations.   PULMONARY REHAB OTHER RESPIRATORY from 10/28/2017 in [place]  Date  10/28/17  Educator  Ace Gins  Instruction Review Code  2- meets goals/outcomes      Signs and Symptoms:  -Group instruction provided by written material and verbal discussion to support subject matter. Warning signs and symptoms of infection, stroke, and heart attack are reviewed and when to call the physician/911 reinforced. Tips for preventing the spread of infection discussed.   Advanced Directives:  -Group instruction provided by verbal instruction and written material to support subject matter. Instructor reviews Advanced Directive laws and proper instruction for filling out document.   Pulmonary Video:  -Group video education that reviews the importance of medication and oxygen compliance, exercise, good nutrition, pulmonary hygiene, and pursed lip and diaphragmatic breathing for the  pulmonary patient.   Exercise for the Pulmonary Patient:  -Group instruction that is supported by a PowerPoint presentation. Instructor discusses benefits of exercise, core components of exercise, frequency, duration, and intensity of an exercise routine, importance of utilizing pulse oximetry during exercise, safety while exercising, and options of places to exercise outside of rehab.     Pulmonary Medications:  -Verbally interactive group education provided by instructor with focus on inhaled medications and proper administration.   Anatomy and Physiology of the Respiratory System and Intimacy:  -Group instruction provided by PowerPoint, verbal discussion, and written material to support subject matter. Instructor reviews respiratory cycle and anatomical components of the respiratory system and their functions. Instructor also reviews differences in obstructive and restrictive respiratory diseases with examples of each. Intimacy, Sex, and Sexuality differences are reviewed with a discussion on how relationships can change when diagnosed with pulmonary disease. Common sexual concerns are reviewed.   MD DAY -A group question and answer session with a medical doctor that allows participants to ask questions that relate to their pulmonary disease state.   OTHER EDUCATION -Group or individual verbal, written, or video instructions that support the educational goals of the pulmonary rehab program.   PULMONARY REHAB OTHER RESPIRATORY from 10/28/2017 in [place]  Date  10/07/17 [KTGYBWL Eating]  Educator  Parke Simmers  Instruction Review Code  1- Verbalizes Understanding      Knowledge Questionnaire Score: Knowledge Questionnaire Score - 10/05/17 0909      Knowledge Questionnaire Score   Pre Score  11/13       Core Components/Risk Factors/Patient Goals at Admission: Personal Goals and Risk Factors  at Admission - 10/05/17 0910      Core Components/Risk  Factors/Patient Goals on Admission   Improve shortness of breath with ADL's  Yes    Intervention  Provide education, individualized exercise plan and daily activity instruction to help decrease symptoms of SOB with activities of daily living.    Expected Outcomes  Short Term: Achieves a reduction of symptoms when performing activities of daily living.    Develop more efficient breathing techniques such as purse lipped breathing and diaphragmatic breathing; and practicing self-pacing with activity  Yes    Intervention  Provide education, demonstration and support about specific breathing techniuqes utilized for more efficient breathing. Include techniques such as pursed lipped breathing, diaphragmatic breathing and self-pacing activity.    Expected Outcomes  Short Term: Participant will be able to demonstrate and use breathing techniques as needed throughout daily activities.    Increase knowledge of respiratory medications and ability to use respiratory devices properly   Yes    Intervention  Provide education and demonstration as needed of appropriate use of medications, inhalers, and oxygen therapy.    Expected Outcomes  Short Term: Achieves understanding of medications use. Understands that oxygen is a medication prescribed by physician. Demonstrates appropriate use of inhaler and oxygen therapy.       Core Components/Risk Factors/Patient Goals Review:  Goals and Risk Factor Review    Row Name 10/12/17 0954 11/02/17 0954           Core Components/Risk Factors/Patient Goals Review   Personal Goals Review  Improve shortness of breath with ADL's;Increase knowledge of respiratory medications and ability to use respiratory devices properly.;Develop more efficient breathing techniques such as purse lipped breathing and diaphragmatic breathing and practicing self-pacing with activity.  Improve shortness of breath with ADL's;Develop more efficient breathing techniques such as purse lipped breathing  and diaphragmatic breathing and practicing self-pacing with activity.      Review  Has only attended 1 exercise session, too early to see progression toward goals  3rd week of exercise, workloads have been increased, responding appropriately      Expected Outcomes  Should see progression of goals stated in the next 30 days  continued increase in workloads with appropriate response         Core Components/Risk Factors/Patient Goals at Discharge (Final Review):  Goals and Risk Factor Review - 11/02/17 0954      Core Components/Risk Factors/Patient Goals Review   Personal Goals Review  Improve shortness of breath with ADL's;Develop more efficient breathing techniques such as purse lipped breathing and diaphragmatic breathing and practicing self-pacing with activity.    Review  3rd week of exercise, workloads have been increased, responding appropriately    Expected Outcomes  continued increase in workloads with appropriate response       ITP Comments:   Comments: ITP REVIEW Pt is making expected progress toward pulmonary rehab goals after completing 6 sessions. Recommend continued exercise, life style modification, education, and utilization of breathing techniques to increase stamina and strength and decrease shortness of breath with exertion.

## 2017-11-04 NOTE — Progress Notes (Signed)
Daily Session Note  Patient Details  Name: [name] [name] MRN: [id number] Date of Birth: 30-Aug-1941 Referring Provider:     PULMONARY REHAB OTHER RESP ORIENTATION from 10/05/2017 in [place]  Referring Provider  Dr. Elsworth Soho      Encounter Date: 11/04/2017  Check In: Session Check In - 11/04/17 1330      Check-In   Location  [facility]    Staff Present  Rosebud Poles, RN, BSN;Molly diVincenzo, MS, ACSM RCEP, Exercise Physiologist;Lisa Ysidro Evert, RN;Parveen Freehling Rollene Rotunda, RN, BSN    Supervising physician immediately available to respond to emergencies  [facility] immediately available    Physician(s)  Dr. Starla Link    Medication changes reported      No    Fall or balance concerns reported     No    Tobacco Cessation  No Change    Warm-up and Cool-down  Performed as group-led instruction    Resistance Training Performed  Yes    VAD Patient?  No      Pain Assessment   Currently in Pain?  No/denies    Multiple Pain Sites  No       Capillary Blood Glucose: No results found for this or any previous visit (from the past 24 hour(s)).  Exercise Prescription Changes - 11/04/17 1618      Response to Exercise   Blood Pressure (Admit)  142/70    Blood Pressure (Exercise)  150/80    Blood Pressure (Exit)  116/60    Heart Rate (Admit)  103 bpm    Heart Rate (Exercise)  112 bpm    Heart Rate (Exit)  97 bpm    Oxygen Saturation (Admit)  95 %    Oxygen Saturation (Exercise)  91 %    Oxygen Saturation (Exit)  97 %    Rating of Perceived Exertion (Exercise)  15    Perceived Dyspnea (Exercise)  3    Duration  Progress to 45 minutes of aerobic exercise without signs/symptoms of physical distress    Intensity  THRR unchanged      Progression   Progression  Continue to progress workloads to maintain intensity without signs/symptoms of physical distress.      Resistance Training   Training Prescription  Yes    Weight  orange bands    Reps  10-15     Time  10 Minutes      Oxygen   Oxygen  Continuous    Liters  2      NuStep   Level  3    Minutes  17    METs  2.4      Track   Laps  10    Minutes  17       Social History   Tobacco Use  Smoking Status Former Smoker  . Packs/day: 1.00  . Years: 40.00  . Pack years: 40.00  . Types: Cigarettes  . Last attempt to quit: 11/03/2012  . Years since quitting: 5.0  Smokeless Tobacco Never Used    Goals Met:  Improved SOB with ADL's Using PLB without cueing & demonstrates good technique Exercise tolerated well No report of cardiac concerns or symptoms Strength training completed today  Goals Unmet:  Not Applicable  Comments: Service time is from 1330 to 1515   Dr. Rush Farmer is Medical Director for Pulmonary Rehab at [facility].

## 2017-11-05 ENCOUNTER — Ambulatory Visit (INDEPENDENT_AMBULATORY_CARE_PROVIDER_SITE_OTHER): Payer: Medicare Other | Admitting: Adult Health

## 2017-11-05 ENCOUNTER — Encounter: Payer: Self-pay | Admitting: Adult Health

## 2017-11-05 VITALS — BP 140/84 | HR 98 | Ht 62.0 in | Wt 135.0 lb

## 2017-11-05 DIAGNOSIS — J8410 Pulmonary fibrosis, unspecified: Secondary | ICD-10-CM | POA: Diagnosis not present

## 2017-11-05 DIAGNOSIS — J9611 Chronic respiratory failure with hypoxia: Secondary | ICD-10-CM | POA: Diagnosis not present

## 2017-11-05 DIAGNOSIS — R918 Other nonspecific abnormal finding of lung field: Secondary | ICD-10-CM

## 2017-11-05 DIAGNOSIS — C3412 Malignant neoplasm of upper lobe, left bronchus or lung: Secondary | ICD-10-CM | POA: Diagnosis not present

## 2017-11-05 DIAGNOSIS — R911 Solitary pulmonary nodule: Secondary | ICD-10-CM | POA: Diagnosis not present

## 2017-11-05 MED ORDER — PREDNISONE 10 MG PO TABS: ORAL_TABLET | ORAL | 0 refills | Status: DC

## 2017-11-05 NOTE — Addendum Note (Signed)
Addended by: Parke Poisson E on: 11/05/2017 05:00 PM   Modules accepted: Orders

## 2017-11-05 NOTE — Assessment & Plan Note (Signed)
Follow up CT chest in 01/2018 .

## 2017-11-05 NOTE — Progress Notes (Signed)
@Patient  ID: [name] [name], female    DOB: [birth year], 76 y.o.   MRN: [id number]  Chief Complaint  Patient presents with  . Follow-up    IPF     Referring provider: Alroy Dust, L.Marlou Sa, MD  HPI: [id number] ex-smoker With a history of lung cancer  for FUof interstitial pneumonia/ ILD  Significant tests/ events reviewed 10/2012 >>ARDS Requiring mechanical ventilation  PFTs 01/2013 - no obstruction, mild restriction, FVC 77%, DLCO decreased at 57%  PFTs 06/2017 show moderate restriction with ratio 93, FVC 56%, FEV1 70%, TLC 44% with DLCO 43%  PET 05/2017 subcarinal LN SUV 4.2  11/05/2017 Follow up : ILD  Patient returns for a 6-week follow-up.  Patient was recently hospitalized in the end of October for a suspected ILD exacerbation.  She was treated with steroids.  She was started on oxygen and continued at discharge home.  Patient had been seen prior to admission and recommended to begin on OFEV as recent CT chest and PFT showed ILD progression and drop in lung function .  She is going through the [facility] approval process and application for assistance .  She says that she felt so much better while taking prednisone.  She felt that her breathing had improved substantially.  She also felt that her oxygen needs were not as much.  She was able to sit up without oxygen and keep her O2 sats greater than 90%.  Since stopping prednisone couple weeks ago.  Patient says that she noticed that her breathing is not quite as good.  And she is having to wear her oxygen at all times.  She is currently on 2 L at rest and 3 L with activity.  She is looking into getting a portable oxygen concentrator light weight.  She continues with pulmonary rehab.  She denies any hemoptysis chest pain orthopnea PND or increased leg swelling.  Allergies  Allergen Reactions  . Penicillins Hives and Swelling    Has patient had a PCN reaction causing immediate rash, facial/tongue/throat swelling, SOB or lightheadedness with  hypotension: Yes Has patient had a PCN reaction causing severe rash involving mucus membranes or skin necrosis: No Has patient had a PCN reaction that required hospitalization: No Has patient had a PCN reaction occurring within the last 10 years: No If all of the above answers are "NO", then may proceed with Cephalosporin use.     Immunization History  Administered Date(s) Administered  . Influenza Whole 08/30/2012  . Influenza-Unspecified 08/30/2016, 08/30/2017  . Pneumococcal Polysaccharide-23 12/01/2011    Past Medical History:  Diagnosis Date  . Anxiety   . Arthritis    a. 10/2012 s/p Right THA.  Marland Kitchen Arthritis    "hands" (09/21/2017)  . Chronic lower back pain   . Complication of anesthesia    "couldn't wake me up after my hip surgery" (09/21/2017)  . Depression   . History of ARDS   . Hyperlipidemia   . Hypertension   . Lung cancer ([place]) 02/16/13   LUL lung SQUAMOUS CELL  . Lung cancer ([place]) dx'd 12/2013    LLL ADENOCARCINOMA  . Pulmonary fibrosis ([place]) dx'd ~ 07/2017  . Pulmonary nodule    a. 10/2012 CT: 28mm anterior LUL nodule w/ spiculated appearance - rec PET CT.  . Seasonal allergies    "I take Claritin" (09/21/2017)  . Sinus headache     Tobacco History: Social History   Tobacco Use  Smoking Status Former Smoker  . Packs/day: 1.00  . Years: 40.00  .  Pack years: 40.00  . Types: Cigarettes  . Last attempt to quit: 11/03/2012  . Years since quitting: 5.0  Smokeless Tobacco Never Used   Counseling given: Not Answered   Outpatient Encounter Medications as of 11/05/2017  Medication Sig  . acetaminophen (TYLENOL) 500 MG tablet Take 1,000 mg by mouth every 6 (six) hours as needed for pain.  Marland Kitchen amLODipine (NORVASC) 10 MG tablet Take 1 tablet (10 mg total) by mouth daily.  Marland Kitchen atorvastatin (LIPITOR) 40 MG tablet Take 20 mg by mouth every morning.  . hydrochlorothiazide (HYDRODIURIL) 25 MG tablet Take 25 mg by mouth daily before breakfast.   . Hypromellose  (ARTIFICIAL TEARS OP) Place 1 drop into both eyes daily.  Marland Kitchen loratadine (CLARITIN) 10 MG tablet Take 10 mg by mouth daily.  . [DISCONTINUED] predniSONE (DELTASONE) 10 MG tablet 6 tabs x1d f/b 5tabsx3d, f/b 4tabs x 3d, f/b 3tabx3d, f/b 2 tab x 3d, f/b 1tabx3d & DC  . predniSONE (DELTASONE) 10 MG tablet Take 2 tablets by mouth daily for 2 weeks, then 10mg  daily and hold at this dose until seen back.   No facility-administered encounter medications on file as of 11/05/2017.      Review of Systems  Constitutional:   No  weight loss, night sweats,  Fevers, chills,  +fatigue, or  lassitude.  HEENT:   No headaches,  Difficulty swallowing,  Tooth/dental problems, or  Sore throat,                No sneezing, itching, ear ache, nasal congestion, post nasal drip,   CV:  No chest pain,  Orthopnea, PND, swelling in lower extremities, anasarca, dizziness, palpitations, syncope.   GI  No heartburn, indigestion, abdominal pain, nausea, vomiting, diarrhea, change in bowel habits, loss of appetite, bloody stools.   Resp:  No chest wall deformity  Skin: no rash or lesions.  GU: no dysuria, change in color of urine, no urgency or frequency.  No flank pain, no hematuria   MS:  No joint pain or swelling.  No decreased range of motion.  No back pain.    Physical Exam  BP 140/84 (BP Location: Left Arm, Cuff Size: Normal)   Pulse 98   Ht 5\' 2"  (1.575 m)   Wt 135 lb (61.2 kg)   SpO2 95%   BMI 24.69 kg/m   GEN: A/Ox3; pleasant , NAD, frail and elderly on oxygen  HEENT:  [place]/AT,  EACs-clear, TMs-wnl, NOSE-clear, THROAT-clear, no lesions, no postnasal drip or exudate noted.   NECK:  Supple w/ fair ROM; no JVD; normal carotid impulses w/o bruits; no thyromegaly or nodules palpated; no lymphadenopathy.    RESP faint bibasilar crackles ,  no accessory muscle use, no dullness to percussion  CARD:  RRR, no m/r/g, no peripheral edema, pulses intact, no cyanosis or clubbing.  GI:   Soft & nt; nml bowel  sounds; no organomegaly or masses detected.   Musco: Warm bil, no deformities or joint swelling noted.   Neuro: alert, no focal deficits noted.    Skin: Warm, no lesions or rashes    Lab Results:  CBC    Imaging: No results found.   Assessment & Plan:   Pulmonary fibrosis ([place]) Recent CT chest and pulmonary function test have showed progression.  Now with recent IPF flare.  Patient is now oxygen dependent.  discussed her disease process in detail with her and her daughter.  As we await OFEV approval will restart prednisone briefly to see if this  helps with her breathing.   Plan Patient Instructions  Begin Prednisone 20mg  daily for 2 weeks , then 10mg  daily . Hold at this dose until seen back .  Continue OFEV approval process.  Continue on Oxygen 2 l/m rest and 3l/m walking  Continue with pulmonary rehab.  Follow up in 4 weeks with Dr. Elsworth Soho   And As needed  .  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   Chronic respiratory failure with hypoxia (HCC) Check on 1 continue on oxygen 2 L at rest and 3 L with activity goal for O2 saturation to be greater than 88-90%.     Rexene Edison, NP 11/05/2017

## 2017-11-05 NOTE — Patient Instructions (Addendum)
Begin Prednisone 20mg  daily for 2 weeks , then 10mg  daily . Hold at this dose until seen back .  Continue OFEV approval process.  Continue on Oxygen 2 l/m rest and 3l/m walking  Continue with pulmonary rehab.  Follow up in 4 weeks with Dr. Elsworth Soho   And As needed  .  Please contact office for sooner follow up if symptoms do not improve or worsen or seek emergency care   CT chest scheduled for 01/2018 .

## 2017-11-05 NOTE — Assessment & Plan Note (Signed)
Recent CT chest and pulmonary function test have showed progression.  Now with recent IPF flare.  Patient is now oxygen dependent.  discussed her disease process in detail with her and her daughter.  As we await OFEV approval will restart prednisone briefly to see if this helps with her breathing.   Plan Patient Instructions  Begin Prednisone 20mg  daily for 2 weeks , then 10mg  daily . Hold at this dose until seen back .  Continue OFEV approval process.  Continue on Oxygen 2 l/m rest and 3l/m walking  Continue with pulmonary rehab.  Follow up in 4 weeks with Dr. Elsworth Soho   And As needed  .  Please contact office for sooner follow up if symptoms do not improve or worsen or seek emergency care

## 2017-11-05 NOTE — Telephone Encounter (Signed)
Forms were given back to patient today.   Will close this encounter.

## 2017-11-05 NOTE — Assessment & Plan Note (Signed)
Check on 1 continue on oxygen 2 L at rest and 3 L with activity goal for O2 saturation to be greater than 88-90%.

## 2017-11-09 ENCOUNTER — Encounter (HOSPITAL_COMMUNITY): Payer: Medicare Other

## 2017-11-11 ENCOUNTER — Encounter (HOSPITAL_COMMUNITY): Payer: Medicare Other

## 2017-11-11 ENCOUNTER — Encounter (HOSPITAL_COMMUNITY)
Admission: RE | Admit: 2017-11-11 | Discharge: 2017-11-11 | Disposition: A | Discharge status: Home / Self Care | Payer: Medicare Other | Source: Ambulatory Visit | Attending: Pulmonary Disease | Admitting: Pulmonary Disease

## 2017-11-11 VITALS — Wt 134.5 lb

## 2017-11-11 DIAGNOSIS — E785 Hyperlipidemia, unspecified: Secondary | ICD-10-CM | POA: Diagnosis not present

## 2017-11-11 DIAGNOSIS — J8410 Pulmonary fibrosis, unspecified: Secondary | ICD-10-CM

## 2017-11-11 DIAGNOSIS — Z79899 Other long term (current) drug therapy: Secondary | ICD-10-CM | POA: Diagnosis not present

## 2017-11-11 DIAGNOSIS — I10 Essential (primary) hypertension: Secondary | ICD-10-CM | POA: Diagnosis not present

## 2017-11-11 DIAGNOSIS — Z85118 Personal history of other malignant neoplasm of bronchus and lung: Secondary | ICD-10-CM | POA: Diagnosis not present

## 2017-11-11 DIAGNOSIS — Z87891 Personal history of nicotine dependence: Secondary | ICD-10-CM | POA: Diagnosis not present

## 2017-11-11 NOTE — Progress Notes (Signed)
Reviewed & agree with plan  

## 2017-11-11 NOTE — Progress Notes (Signed)
[name] [name] 76 y.o. female   DOB: 1941/10/12 MRN: [id number]          Nutrition 1. Pulmonary fibrosis ([place])    Meds reviewed. Deltasone noted Note Spoke with pt. There are some ways the pt can make her eating habits healthier. Pt's Rate Your Plate results reviewed with pt. Specific dietary areas to focus on to improve diet discussed. Pt states she is more concerned with quality of life than quantity. Pt reports she is working toward taking Linganore. Pt is aware of GI side effects and the need for increased protein intake. Ways to incorporate protein at each meal discussed. Pt expressed understanding of the information reviewed via feedback method.   Wt Readings from Last 3 Encounters:  11/11/17 134 lb 7.7 oz (61 kg)  11/05/17 135 lb (61.2 kg)  11/04/17 135 lb 2.3 oz (61.3 kg)    Nutrition Diagnosis ? Food-and nutrition-related knowledge deficit related to lack of exposure to information as related to diagnosis of pulmonary disease ? Overweight related to excessive energy intake as evidenced by a BMI of 25.6  Goal(s) 1. Identify food quantities necessary to achieve wt loss of  -2# per week to a goal wt loss of 6-24 lb at graduation from pulmonary rehab. 2. Describe the benefit of including fruits, vegetables, whole grains, and low-fat dairy products in a healthy meal plan. Plan:  Pt to attend Pulmonary Nutrition class Will provide client-centered nutrition education as part of interdisciplinary care.   Monitor and evaluate progress toward nutrition goal with team.  Monitor and Evaluate progress toward nutrition goal with team.   Derek Mound, M.Ed, RD, LDN, CDE 11/11/2017 3:23 PM

## 2017-11-11 NOTE — Progress Notes (Signed)
Daily Session Note  Patient Details  Name: [name] [name] MRN: [id number] Date of Birth: Sep 06, 1941 Referring Provider:     PULMONARY REHAB OTHER RESP ORIENTATION from 10/05/2017 in [place]  Referring Provider  Dr. Elsworth Soho      Encounter Date: 11/11/2017  Check In: Session Check In - 11/11/17 1330      Check-In   Location  [facility]    Staff Present  Rosebud Poles, RN, BSN;Molly diVincenzo, MS, ACSM RCEP, Exercise Physiologist;Portia Rollene Rotunda, RN, BSN    Supervising physician immediately available to respond to emergencies  [facility] immediately available    Physician(s)  Dr. Starla Link    Medication changes reported      No    Fall or balance concerns reported     No    Tobacco Cessation  No Change    Warm-up and Cool-down  Performed as group-led instruction    Resistance Training Performed  Yes    VAD Patient?  No      Pain Assessment   Currently in Pain?  No/denies    Multiple Pain Sites  No       Capillary Blood Glucose: No results found for this or any previous visit (from the past 24 hour(s)).  Exercise Prescription Changes - 11/11/17 1500      Response to Exercise   Blood Pressure (Admit)  140/60    Blood Pressure (Exercise)  150/62    Blood Pressure (Exit)  134/62    Heart Rate (Admit)  107 bpm    Heart Rate (Exercise)  123 bpm    Heart Rate (Exit)  93 bpm    Oxygen Saturation (Admit)  97 %    Oxygen Saturation (Exercise)  93 %    Oxygen Saturation (Exit)  93 %    Rating of Perceived Exertion (Exercise)  11    Perceived Dyspnea (Exercise)  1    Duration  Progress to 45 minutes of aerobic exercise without signs/symptoms of physical distress    Intensity  THRR unchanged      Progression   Progression  Continue to progress workloads to maintain intensity without signs/symptoms of physical distress.      Resistance Training   Training Prescription  Yes    Weight  orange bands    Reps  10-15    Time  10  Minutes      Oxygen   Oxygen  Continuous    Liters  2      Bike   Level  -- nutrition consult during this session      NuStep   Level  5    Minutes  17    METs  2.2      Track   Laps  4    Minutes  17       Social History   Tobacco Use  Smoking Status Former Smoker  . Packs/day: 1.00  . Years: 40.00  . Pack years: 40.00  . Types: Cigarettes  . Last attempt to quit: 11/03/2012  . Years since quitting: 5.0  Smokeless Tobacco Never Used    Goals Met:  Exercise tolerated well Strength training completed today  Goals Unmet:  Not Applicable  Comments: Service time is from 1330 to 1500    Dr. Rush Farmer is Medical Director for Pulmonary Rehab at [facility].

## 2017-11-15 DIAGNOSIS — I10 Essential (primary) hypertension: Secondary | ICD-10-CM | POA: Diagnosis not present

## 2017-11-15 DIAGNOSIS — E7800 Pure hypercholesterolemia, unspecified: Secondary | ICD-10-CM | POA: Diagnosis not present

## 2017-11-15 DIAGNOSIS — M545 Low back pain: Secondary | ICD-10-CM | POA: Diagnosis not present

## 2017-11-16 ENCOUNTER — Encounter (HOSPITAL_COMMUNITY): Payer: Medicare Other

## 2017-11-16 ENCOUNTER — Encounter (HOSPITAL_COMMUNITY)
Admission: RE | Admit: 2017-11-16 | Discharge: 2017-11-16 | Disposition: A | Discharge status: Home / Self Care | Payer: Medicare Other | Source: Ambulatory Visit | Attending: Pulmonary Disease | Admitting: Pulmonary Disease

## 2017-11-16 VITALS — Wt 135.6 lb

## 2017-11-16 DIAGNOSIS — Z85118 Personal history of other malignant neoplasm of bronchus and lung: Secondary | ICD-10-CM | POA: Diagnosis not present

## 2017-11-16 DIAGNOSIS — E785 Hyperlipidemia, unspecified: Secondary | ICD-10-CM | POA: Diagnosis not present

## 2017-11-16 DIAGNOSIS — J8410 Pulmonary fibrosis, unspecified: Secondary | ICD-10-CM | POA: Diagnosis not present

## 2017-11-16 DIAGNOSIS — Z87891 Personal history of nicotine dependence: Secondary | ICD-10-CM | POA: Diagnosis not present

## 2017-11-16 DIAGNOSIS — Z79899 Other long term (current) drug therapy: Secondary | ICD-10-CM | POA: Diagnosis not present

## 2017-11-16 DIAGNOSIS — I10 Essential (primary) hypertension: Secondary | ICD-10-CM | POA: Diagnosis not present

## 2017-11-16 NOTE — Progress Notes (Signed)
Daily Session Note  Patient Details  Name: [name] [name] MRN: [id number] Date of Birth: [birth year] Referring Provider:     PULMONARY REHAB OTHER RESP ORIENTATION from 10/05/2017 in [place]  Referring Provider  Dr. Elsworth Soho      Encounter Date: 11/16/2017  Check In: Session Check In - 11/16/17 1531      Check-In   Location  [facility]    Staff Present  Rodney Langton, RN;Portia Rollene Rotunda, RN, Maxcine Ham, RN, BSN;Denai Caba, MS, ACSM RCEP, Exercise Physiologist    Supervising physician immediately available to respond to emergencies  [facility] immediately available    Physician(s)  Dr. Lucianne Lei    Medication changes reported      No    Fall or balance concerns reported     No    Tobacco Cessation  No Change    Warm-up and Cool-down  Performed as group-led instruction    Resistance Training Performed  Yes    VAD Patient?  No      Pain Assessment   Currently in Pain?  No/denies    Multiple Pain Sites  No       Capillary Blood Glucose: No results found for this or any previous visit (from the past 24 hour(s)).  Exercise Prescription Changes - 11/16/17 1500      Response to Exercise   Blood Pressure (Admit)  130/70    Blood Pressure (Exercise)  148/74    Blood Pressure (Exit)  120/72    Heart Rate (Admit)  99 bpm    Heart Rate (Exercise)  112 bpm    Heart Rate (Exit)  88 bpm    Oxygen Saturation (Admit)  99 %    Oxygen Saturation (Exercise)  93 %    Oxygen Saturation (Exit)  98 %    Rating of Perceived Exertion (Exercise)  15    Perceived Dyspnea (Exercise)  2    Duration  Progress to 45 minutes of aerobic exercise without signs/symptoms of physical distress    Intensity  THRR unchanged      Progression   Progression  Continue to progress workloads to maintain intensity without signs/symptoms of physical distress.      Resistance Training   Training Prescription  Yes    Weight  orange bands    Reps  10-15     Time  10 Minutes      Oxygen   Oxygen  Continuous    Liters  2      Bike   Level  0.4    Minutes  17      NuStep   Level  5    Minutes  17    METs  2.4      Track   Laps  9    Minutes  17       Social History   Tobacco Use  Smoking Status Former Smoker  . Packs/day: 1.00  . Years: 40.00  . Pack years: 40.00  . Types: Cigarettes  . Last attempt to quit: 11/03/2012  . Years since quitting: 5.0  Smokeless Tobacco Never Used    Goals Met:  Exercise tolerated well No report of cardiac concerns or symptoms Strength training completed today  Goals Unmet:  Not Applicable  Comments: Service time is from 1:30p to 3:15p    Dr. Rush Farmer is Medical Director for Pulmonary Rehab at [facility].

## 2017-11-18 ENCOUNTER — Encounter (HOSPITAL_COMMUNITY)
Admission: RE | Admit: 2017-11-18 | Discharge: 2017-11-18 | Disposition: A | Discharge status: Home / Self Care | Payer: Medicare Other | Source: Ambulatory Visit | Attending: Pulmonary Disease | Admitting: Pulmonary Disease

## 2017-11-18 ENCOUNTER — Encounter (HOSPITAL_COMMUNITY): Payer: Medicare Other

## 2017-11-18 VITALS — Wt 134.7 lb

## 2017-11-18 DIAGNOSIS — Z85118 Personal history of other malignant neoplasm of bronchus and lung: Secondary | ICD-10-CM | POA: Diagnosis not present

## 2017-11-18 DIAGNOSIS — Z79899 Other long term (current) drug therapy: Secondary | ICD-10-CM | POA: Diagnosis not present

## 2017-11-18 DIAGNOSIS — J8410 Pulmonary fibrosis, unspecified: Secondary | ICD-10-CM | POA: Diagnosis not present

## 2017-11-18 DIAGNOSIS — Z87891 Personal history of nicotine dependence: Secondary | ICD-10-CM | POA: Diagnosis not present

## 2017-11-18 DIAGNOSIS — E785 Hyperlipidemia, unspecified: Secondary | ICD-10-CM | POA: Diagnosis not present

## 2017-11-18 DIAGNOSIS — I10 Essential (primary) hypertension: Secondary | ICD-10-CM | POA: Diagnosis not present

## 2017-11-18 NOTE — Progress Notes (Signed)
Daily Session Note  Patient Details  Name: [name] [name] MRN: [id number] Date of Birth: January 16, 1941 Referring Provider:     PULMONARY REHAB OTHER RESP ORIENTATION from 10/05/2017 in [place]  Referring Provider  Dr. Elsworth Soho      Encounter Date: 11/18/2017  Check In: Session Check In - 11/18/17 1355      Check-In   Location  [facility]    Staff Present  Su Hilt, MS, ACSM RCEP, Exercise Physiologist;Portia Rollene Rotunda, RN, Roque Cash, RN    Supervising physician immediately available to respond to emergencies  [facility] immediately available    Physician(s)  Dr. Wendee Beavers    Medication changes reported      No    Fall or balance concerns reported     No    Tobacco Cessation  No Change    Warm-up and Cool-down  Performed as group-led instruction    Resistance Training Performed  Yes    VAD Patient?  No      Pain Assessment   Currently in Pain?  No/denies    Multiple Pain Sites  No       Capillary Blood Glucose: No results found for this or any previous visit (from the past 24 hour(s)).  Exercise Prescription Changes - 11/18/17 1600      Response to Exercise   Blood Pressure (Admit)  128/60    Blood Pressure (Exercise)  154/74    Blood Pressure (Exit)  110/64    Heart Rate (Admit)  93 bpm    Heart Rate (Exercise)  117 bpm    Heart Rate (Exit)  104 bpm    Oxygen Saturation (Admit)  98 %    Oxygen Saturation (Exercise)  93 %    Oxygen Saturation (Exit)  98 %    Rating of Perceived Exertion (Exercise)  13    Perceived Dyspnea (Exercise)  1    Duration  Progress to 45 minutes of aerobic exercise without signs/symptoms of physical distress    Intensity  THRR unchanged      Progression   Progression  Continue to progress workloads to maintain intensity without signs/symptoms of physical distress.      Resistance Training   Training Prescription  Yes    Weight  orange bands    Reps  10-15    Time  10 Minutes       Oxygen   Oxygen  Continuous    Liters  2      Bike   Level  0.4    Minutes  17      Track   Laps  12    Minutes  17       Social History   Tobacco Use  Smoking Status Former Smoker  . Packs/day: 1.00  . Years: 40.00  . Pack years: 40.00  . Types: Cigarettes  . Last attempt to quit: 11/03/2012  . Years since quitting: 5.0  Smokeless Tobacco Never Used    Goals Met:  Exercise tolerated well No report of cardiac concerns or symptoms Strength training completed today  Goals Unmet:  Not Applicable  Comments: Service time is from 1330 to 1540    Dr. Rush Farmer is Medical Director for Pulmonary Rehab at [facility].

## 2017-11-25 ENCOUNTER — Encounter (HOSPITAL_COMMUNITY)
Admission: RE | Admit: 2017-11-25 | Discharge: 2017-11-25 | Disposition: A | Discharge status: Home / Self Care | Payer: Medicare Other | Source: Ambulatory Visit | Attending: Pulmonary Disease | Admitting: Pulmonary Disease

## 2017-11-25 ENCOUNTER — Telehealth: Payer: Self-pay | Admitting: Pulmonary Disease

## 2017-11-25 ENCOUNTER — Encounter (HOSPITAL_COMMUNITY): Payer: Medicare Other

## 2017-11-25 VITALS — Wt 136.0 lb

## 2017-11-25 DIAGNOSIS — I10 Essential (primary) hypertension: Secondary | ICD-10-CM | POA: Diagnosis not present

## 2017-11-25 DIAGNOSIS — Z87891 Personal history of nicotine dependence: Secondary | ICD-10-CM | POA: Diagnosis not present

## 2017-11-25 DIAGNOSIS — Z79899 Other long term (current) drug therapy: Secondary | ICD-10-CM | POA: Diagnosis not present

## 2017-11-25 DIAGNOSIS — E876 Hypokalemia: Secondary | ICD-10-CM | POA: Diagnosis not present

## 2017-11-25 DIAGNOSIS — E785 Hyperlipidemia, unspecified: Secondary | ICD-10-CM | POA: Diagnosis not present

## 2017-11-25 DIAGNOSIS — J8410 Pulmonary fibrosis, unspecified: Secondary | ICD-10-CM | POA: Diagnosis not present

## 2017-11-25 DIAGNOSIS — Z85118 Personal history of other malignant neoplasm of bronchus and lung: Secondary | ICD-10-CM | POA: Diagnosis not present

## 2017-11-25 NOTE — Progress Notes (Signed)
Daily Session Note  Patient Details  Name: [name] [name] MRN: [id number] Date of Birth: 30-Jun-1941 Referring Provider:     PULMONARY REHAB OTHER RESP ORIENTATION from 10/05/2017 in [place]  Referring Provider  Dr. Elsworth Soho      Encounter Date: 11/25/2017  Check In: Session Check In - 11/25/17 1330      Check-In   Location  [facility]    Staff Present  Rosebud Poles, RN, BSN;Lisa Ysidro Evert, RN;Portia Rollene Rotunda, RN, BSN    Supervising physician immediately available to respond to emergencies  [facility] immediately available    Physician(s)  Dr. Rockne Menghini    Medication changes reported      No    Fall or balance concerns reported     No    Tobacco Cessation  No Change    Warm-up and Cool-down  Performed as group-led instruction    Resistance Training Performed  Yes    VAD Patient?  No      Pain Assessment   Currently in Pain?  No/denies    Multiple Pain Sites  No       Capillary Blood Glucose: No results found for this or any previous visit (from the past 24 hour(s)).  Exercise Prescription Changes - 11/25/17 1500      Response to Exercise   Blood Pressure (Admit)  144/60    Blood Pressure (Exercise)  130/80    Blood Pressure (Exit)  144/72    Heart Rate (Admit)  93 bpm    Heart Rate (Exercise)  116 bpm    Heart Rate (Exit)  99 bpm    Oxygen Saturation (Admit)  100 %    Oxygen Saturation (Exercise)  92 %    Oxygen Saturation (Exit)  99 %    Rating of Perceived Exertion (Exercise)  15    Perceived Dyspnea (Exercise)  1    Duration  Progress to 45 minutes of aerobic exercise without signs/symptoms of physical distress    Intensity  THRR unchanged      Progression   Progression  Continue to progress workloads to maintain intensity without signs/symptoms of physical distress.      Resistance Training   Training Prescription  Yes    Weight  orange bands    Reps  10-15    Time  10 Minutes      Oxygen   Oxygen   Continuous    Liters  2      Bike   Level  0.6    Minutes  17      NuStep   Level  5    Minutes  17    METs  1.8      Track   Laps  10    Minutes  17       Social History   Tobacco Use  Smoking Status Former Smoker  . Packs/day: 1.00  . Years: 40.00  . Pack years: 40.00  . Types: Cigarettes  . Last attempt to quit: 11/03/2012  . Years since quitting: 5.0  Smokeless Tobacco Never Used    Goals Met:  Exercise tolerated well Strength training completed today  Goals Unmet:  Not Applicable  Comments:Service time is from 1330 to [place]    Dr. Rush Farmer is Medical Director for Pulmonary Rehab at [facility].

## 2017-11-25 NOTE — Telephone Encounter (Signed)
I have looked in RA folder and the fax machine. I will leave in Triage until we receive paperwork.

## 2017-11-25 NOTE — Telephone Encounter (Signed)
I received the paperwork and gave to Cherina to fill out and have RA sign.

## 2017-11-25 NOTE — Telephone Encounter (Signed)
Spoke with Geneticist, molecular and she states we need to send in [employer] card and sign the enrollment form. I did not see form so she is going to re-fax the papers.

## 2017-11-25 NOTE — Progress Notes (Signed)
Pulmonary Individual Treatment Plan  Patient Details  Name: Tawan Corkern MRN: [id number] Date of Birth: 1941-05-28 Referring Provider:     PULMONARY REHAB OTHER RESP ORIENTATION from 10/05/2017 in [facility]  Referring Provider  Dr. Elsworth Soho      Initial Encounter Date:    [place] from 10/05/2017 in [facility]  Date  10/05/17  Referring Provider  Dr. Elsworth Soho      Visit Diagnosis: Pulmonary fibrosis ([place])  Patient's Home Medications on Admission:   Current Outpatient Medications:  .  acetaminophen (TYLENOL) 500 MG tablet, Take 1,000 mg by mouth every 6 (six) hours as needed for pain., Disp: , Rfl:  .  amLODipine (NORVASC) 10 MG tablet, Take 1 tablet (10 mg total) by mouth daily., Disp: 30 tablet, Rfl: 0 .  atorvastatin (LIPITOR) 40 MG tablet, Take 20 mg by mouth every morning., Disp: , Rfl:  .  hydrochlorothiazide (HYDRODIURIL) 25 MG tablet, Take 25 mg by mouth daily before breakfast. , Disp: , Rfl:  .  Hypromellose (ARTIFICIAL TEARS OP), Place 1 drop into both eyes daily., Disp: , Rfl:  .  loratadine (CLARITIN) 10 MG tablet, Take 10 mg by mouth daily., Disp: , Rfl:  .  predniSONE (DELTASONE) 10 MG tablet, Take 2 tablets by mouth daily for 2 weeks, then 13m daily and hold at this dose until seen back., Disp: 60 tablet, Rfl: 0  Past Medical History: Past Medical History:  Diagnosis Date  . Anxiety   . Arthritis    a. 10/2012 s/p Right THA.  .Marland KitchenArthritis    "hands" (09/21/2017)  . Chronic lower back pain   . Complication of anesthesia    "couldn't wake me up after my hip surgery" (09/21/2017)  . Depression   . History of ARDS   . Hyperlipidemia   . Hypertension   . Lung cancer ([facility] 02/16/13   LUL lung SQUAMOUS CELL  . Lung [facility] ([facility] dx'd 12/2013    LLL ADENOCARCINOMA  . Pulmonary fibrosis ([facility] dx'd ~ 07/2017  . Pulmonary nodule    a. 10/2012 CT: 172manterior LUL nodule w/  spiculated appearance - rec PET CT.  . Seasonal allergies    "I take Claritin" (09/21/2017)  . Sinus headache     Tobacco Use: Social History   Tobacco Use  Smoking Status Former Smoker  . Packs/day: 1.00  . Years: 40.00  . Pack years: 40.00  . Types: Cigarettes  . Last attempt to quit: 11/03/2012  . Years since quitting: 5.0  Smokeless Tobacco Never Used    Labs: Recent Review Flowsheet Data    Labs for ITP Cardiac and Pulmonary Rehab Latest Ref Rng & Units 11/11/2012 11/12/2012 11/12/2012 11/12/2012 11/12/2012   Trlycerides <150 mg/dL - - - - -   PHART 7.350 - 7.450 7.374 7.392 7.405 7.512(H) 7.473(H)   PCO2ART 35.0 - 45.0 mmHg 31.1(L) 51.1(H) 53.7(H) 47.8(H) 53.4(H)   HCO3 20.0 - 24.0 mEq/L 17.7(L) 30.8(H) 32.9(H) 38.1(H) 38.6(H)   TCO2 0 - 100 mmol/L 17.3 28.3 30.6 34.6 35.3   ACIDBASEDEF 0.0 - 2.0 mmol/L 6.4(H) - - - -   O2SAT % 94.2 93.3 94.4 98.8 99.5      Capillary Blood Glucose: Lab Results  Component Value Date   GLUCAP 98 09/24/2017   GLUCAP 100 (H) 09/24/2017   GLUCAP 142 (H) 09/23/2017   GLUCAP 131 (H) 09/23/2017   GLUCAP 125 (H) 09/23/2017     Pulmonary Assessment  Scores: Pulmonary Assessment Scores    Row Name 10/06/17 1024         ADL UCSD   ADL Phase  Entry     SOB Score total  42       CAT Score   CAT Score  17 Entry        Pulmonary Function Assessment: Pulmonary Function Assessment - 10/05/17 0909      Breath   Bilateral Breath Sounds  Expiratory;Wheezes fine crackles in bases       Exercise Target Goals:    Exercise Program Goal: Individual exercise prescription set with THRR, safety & activity barriers. Participant demonstrates ability to understand and report RPE using BORG scale, to self-measure pulse accurately, and to acknowledge the importance of the exercise prescription.  Exercise Prescription Goal: Starting with aerobic activity 30 plus minutes a day, 3 days per week for initial exercise prescription. Provide home  exercise prescription and guidelines that participant acknowledges understanding prior to discharge.  Activity Barriers & Risk Stratification: Activity Barriers & Cardiac Risk Stratification - 10/05/17 0851      Activity Barriers & Cardiac Risk Stratification   Activity Barriers  Shortness of Breath;Deconditioning;Back Problems       6 Minute Walk:   Oxygen Initial Assessment: Oxygen Initial Assessment - 10/05/17 1319      Initial 6 min Walk   Oxygen Used  Continuous;E-Tanks    Liters per minute  2      Program Oxygen Prescription   Program Oxygen Prescription  E-Tanks;Continuous    Liters per minute  2       Oxygen Re-Evaluation: Oxygen Re-Evaluation    Row Name 10/12/17 0951 11/02/17 0951 11/19/17 0900         Program Oxygen Prescription   Program Oxygen Prescription  E-Tanks;Continuous  Continuous;E-Tanks  Continuous;E-Tanks     Liters per minute  '2  2  2       ' Home Oxygen   Home Oxygen Device  Portable Concentrator;E-Tanks  E-Tanks;Portable Concentrator  E-Tanks;Portable Concentrator     Sleep Oxygen Prescription  Continuous  Continuous  Continuous     Liters per minute  '2  2  2     ' Home Exercise Oxygen Prescription  Continuous  Continuous  Continuous     Liters per minute  '2  2  2     ' Home at Rest Exercise Oxygen Prescription  None  None  None     Compliance with Home Oxygen Use  Yes  Yes  Yes       Goals/Expected Outcomes   Short Term Goals  To learn and exhibit compliance with exercise, home and travel O2 prescription;To learn and understand importance of monitoring SPO2 with pulse oximeter and demonstrate accurate use of the pulse oximeter.;To learn and understand importance of maintaining oxygen saturations>88%;To learn and demonstrate proper pursed lip breathing techniques or other breathing techniques.;To learn and demonstrate proper use of respiratory medications  To learn and exhibit compliance with exercise, home and travel O2 prescription  To learn and  exhibit compliance with exercise, home and travel O2 prescription     Long  Term Goals  Exhibits compliance with exercise, home and travel O2 prescription;Verbalizes importance of monitoring SPO2 with pulse oximeter and return demonstration;Maintenance of O2 saturations>88%;Exhibits proper breathing techniques, such as pursed lip breathing or other method taught during program session;Compliance with respiratory medication  Exhibits compliance with exercise, home and travel O2 prescription  Exhibits compliance with exercise, home and travel O2 prescription  Comments  Just began program, for now oxygen useage is as stated  compliant with oxygen useage  compliant with oxygen useage     Goals/Expected Outcomes  compliance with oxygen useage  continued compliance  continued compliance        Oxygen Discharge (Final Oxygen Re-Evaluation): Oxygen Re-Evaluation - 11/19/17 0900      Program Oxygen Prescription   Program Oxygen Prescription  Continuous;E-Tanks    Liters per minute  2      Home Oxygen   Home Oxygen Device  E-Tanks;Portable Concentrator    Sleep Oxygen Prescription  Continuous    Liters per minute  2    Home Exercise Oxygen Prescription  Continuous    Liters per minute  2    Home at Rest Exercise Oxygen Prescription  None    Compliance with Home Oxygen Use  Yes      Goals/Expected Outcomes   Short Term Goals  To learn and exhibit compliance with exercise, home and travel O2 prescription    Long  Term Goals  Exhibits compliance with exercise, home and travel O2 prescription    Comments  compliant with oxygen useage    Goals/Expected Outcomes  continued compliance       Initial Exercise Prescription: Initial Exercise Prescription - 10/05/17 1300      Date of Initial Exercise RX and Referring Provider   Date  10/05/17    Referring Provider  Dr. Elsworth Soho      Oxygen   Oxygen  Continuous    Liters  2      Bike   Level  0.4    Minutes  17      NuStep   Level  2     Minutes  17    METs  1.5      Track   Laps  10    Minutes  17      Prescription Details   Frequency (times per week)  2    Duration  Progress to 45 minutes of aerobic exercise without signs/symptoms of physical distress      Intensity   THRR 40-80% of Max Heartrate  58-117    Ratings of Perceived Exertion  11-13    Perceived Dyspnea  0-4      Progression   Progression  Continue progressive overload as per policy without signs/symptoms or physical distress.      Resistance Training   Training Prescription  Yes    Weight  orange bands    Reps  10-15       Perform Capillary Blood Glucose checks as needed.  Exercise Prescription Changes: Exercise Prescription Changes    Row Name 10/07/17 1500 10/12/17 1500 10/19/17 1500 10/26/17 1500 10/28/17 1554     Response to Exercise   Blood Pressure (Admit)  140/70  120/64  128/52  128/68  128/64   Blood Pressure (Exercise)  140/60  96/60  124/70  132/70  140/60   Blood Pressure (Exit)  110/60  130/60  130/60  122/54  118/64   Heart Rate (Admit)  100 bpm  107 bpm  100 bpm  107 bpm  104 bpm   Heart Rate (Exercise)  125 bpm  127 bpm  119 bpm  111 bpm  117 bpm   Heart Rate (Exit)  100 bpm  107 bpm  104 bpm  102 bpm  96 bpm   Oxygen Saturation (Admit)  96 %  92 %  94 %  97 %  96 %  Oxygen Saturation (Exercise)  91 %  92 %  94 %  92 %  91 %   Oxygen Saturation (Exit)  97 %  92 %  98 %  98 %  97 %   Rating of Perceived Exertion (Exercise)  '15  15  15  13  13   ' Perceived Dyspnea (Exercise)  '1  2  1  1  1   ' Duration  Progress to 45 minutes of aerobic exercise without signs/symptoms of physical distress  Progress to 45 minutes of aerobic exercise without signs/symptoms of physical distress  Progress to 45 minutes of aerobic exercise without signs/symptoms of physical distress  Progress to 45 minutes of aerobic exercise without signs/symptoms of physical distress  Progress to 45 minutes of aerobic exercise without signs/symptoms of physical  distress   Intensity  Other (comment) HRR 40-80%  THRR unchanged  THRR unchanged  THRR unchanged  THRR unchanged     Progression   Progression  -  Continue to progress workloads to maintain intensity without signs/symptoms of physical distress.  Continue to progress workloads to maintain intensity without signs/symptoms of physical distress.  Continue to progress workloads to maintain intensity without signs/symptoms of physical distress.  Continue to progress workloads to maintain intensity without signs/symptoms of physical distress.     Resistance Training   Training Prescription  Yes  Yes  Yes  Yes  Yes   Weight  orange bands  orange bands  orange bands  orange bands  orange bands   Reps  10-15  10-15  10-15  10-15  10-15   Time  -  10 Minutes  10 Minutes  10 Minutes  10 Minutes     Oxygen   Oxygen  Continuous  Continuous  Continuous  Continuous  Continuous   Liters  '2  2  2  2  2     ' Bike   Level  0.4  0.4  0.4  0.4  -   Minutes  '17  17  17  17  ' -     NuStep   Level  -  '2  3  3  3   ' Minutes  -  '17  17  17  17   ' METs  -  1.6  1.9  2.2  2.2     Track   Laps  '6  7  5  4  7   ' Minutes  '17  17  17  17  17     ' Home Exercise Plan   Plans to continue exercise at  -  -  -  Home (comment)  Home (comment)   Frequency  -  -  -  Add 3 additional days to program exercise sessions.  Add 3 additional days to program exercise sessions.   Row Name 11/02/17 1530 11/04/17 1618 11/11/17 1500 11/16/17 1500 11/18/17 1600     Response to Exercise   Blood Pressure (Admit)  126/60  142/70  140/60  130/70  128/60   Blood Pressure (Exercise)  144/64  150/80  150/62  148/74  154/74   Blood Pressure (Exit)  124/70  116/60  134/62  120/72  110/64   Heart Rate (Admit)  109 bpm  103 bpm  107 bpm  99 bpm  93 bpm   Heart Rate (Exercise)  118 bpm  112 bpm  123 bpm  112 bpm  117 bpm   Heart Rate (Exit)  105 bpm  97 bpm  93 bpm  88 bpm  104 bpm   Oxygen Saturation (Admit)  95 %  95 %  97 %  99 %  98 %    Oxygen Saturation (Exercise)  90 %  91 %  93 %  93 %  93 %   Oxygen Saturation (Exit)  96 %  97 %  93 %  98 %  98 %   Rating of Perceived Exertion (Exercise)  '13  15  11  15  13   ' Perceived Dyspnea (Exercise)  '2  3  1  2  1   ' Duration  Progress to 45 minutes of aerobic exercise without signs/symptoms of physical distress  Progress to 45 minutes of aerobic exercise without signs/symptoms of physical distress  Progress to 45 minutes of aerobic exercise without signs/symptoms of physical distress  Progress to 45 minutes of aerobic exercise without signs/symptoms of physical distress  Progress to 45 minutes of aerobic exercise without signs/symptoms of physical distress   Intensity  THRR unchanged  THRR unchanged  THRR unchanged  THRR unchanged  THRR unchanged     Progression   Progression  Continue to progress workloads to maintain intensity without signs/symptoms of physical distress.  Continue to progress workloads to maintain intensity without signs/symptoms of physical distress.  Continue to progress workloads to maintain intensity without signs/symptoms of physical distress.  Continue to progress workloads to maintain intensity without signs/symptoms of physical distress.  Continue to progress workloads to maintain intensity without signs/symptoms of physical distress.     Resistance Training   Training Prescription  Yes  Yes  Yes  Yes  Yes   Weight  orange bands  orange bands  orange bands  orange bands  orange bands   Reps  10-15  10-15  10-15  10-15  10-15   Time  10 Minutes  10 Minutes  10 Minutes  10 Minutes  10 Minutes     Oxygen   Oxygen  Continuous  Continuous  Continuous  Continuous  Continuous   Liters  '2  2  2  2  2     ' Bike   Level  0.4  -  - nutrition consult during this session  0.4  0.4   Minutes  17  -  -  17  17     NuStep   Level  '4  3  5  5  ' -   Minutes  '17  17  17  17  ' -   METs  2.2  2.4  2.2  2.4  -     Track   Laps  '7  10  4  9  12   ' Minutes  '17  17  17  17  17      ' Home Exercise Plan   Plans to continue exercise at  Home (comment)  -  -  -  -   Frequency  Add 3 additional days to program exercise sessions.  -  -  -  -      Exercise Comments: Exercise Comments    Row Name 10/28/17 0708           Exercise Comments  Home exercise completed          Exercise Goals and Review: Exercise Goals    Row Name 10/05/17 0854             Exercise Goals   Increase Physical Activity  Yes       Intervention  Provide advice, education,  support and counseling about physical activity/exercise needs.;Develop an individualized exercise prescription for aerobic and resistive training based on initial evaluation findings, risk stratification, comorbidities and participant's personal goals.       Expected Outcomes  Achievement of increased cardiorespiratory fitness and enhanced flexibility, muscular endurance and strength shown through measurements of functional capacity and personal statement of participant.       Increase Strength and Stamina  Yes       Intervention  Provide advice, education, support and counseling about physical activity/exercise needs.;Develop an individualized exercise prescription for aerobic and resistive training based on initial evaluation findings, risk stratification, comorbidities and participant's personal goals.       Expected Outcomes  Achievement of increased cardiorespiratory fitness and enhanced flexibility, muscular endurance and strength shown through measurements of functional capacity and personal statement of participant.       Able to understand and use rate of perceived exertion (RPE) scale  Yes       Intervention  Provide education and explanation on how to use RPE scale       Expected Outcomes  Long Term:  Able to use RPE to guide intensity level when exercising independently       Able to understand and use Dyspnea scale  Yes       Intervention  Provide education and explanation on how to use Dyspnea scale        Expected Outcomes  Short Term: Able to use Dyspnea scale daily in rehab to express subjective sense of shortness of breath during exertion;Long Term: Able to use Dyspnea scale to guide intensity level when exercising independently       Knowledge and understanding of Target Heart Rate Range (THRR)  Yes       Intervention  Provide education and explanation of THRR including how the numbers were predicted and where they are located for reference       Expected Outcomes  Short Term: Able to state/look up THRR;Short Term: Able to use daily as guideline for intensity in rehab;Long Term: Able to use THRR to govern intensity when exercising independently       Understanding of Exercise Prescription  Yes       Intervention  Provide education, explanation, and written materials on patient's individual exercise prescription       Expected Outcomes  Short Term: Able to explain program exercise prescription;Long Term: Able to explain home exercise prescription to exercise independently          Exercise Goals Re-Evaluation : Exercise Goals Re-Evaluation    Row Name 10/11/17 1647 11/01/17 1507 11/18/17 1018         Exercise Goal Re-Evaluation   Exercise Goals Review  Increase Strength and Stamina;Increase Physical Activity;Able to understand and use Dyspnea scale;Able to understand and use rate of perceived exertion (RPE) scale;Knowledge and understanding of Target Heart Rate Range (THRR);Understanding of Exercise Prescription  Increase Physical Activity;Able to understand and use rate of perceived exertion (RPE) scale;Knowledge and understanding of Target Heart Rate Range (THRR);Understanding of Exercise Prescription;Increase Strength and Stamina;Able to understand and use Dyspnea scale  Increase Physical Activity;Able to understand and use rate of perceived exertion (RPE) scale;Knowledge and understanding of Target Heart Rate Range (THRR);Understanding of Exercise Prescription;Increase Strength and  Stamina;Able to understand and use Dyspnea scale     Comments  Patient has only attended one exercise session. Will cont. to monitor and progress as able.   Patient is progressing slowly. Doctor has placed an order for a POC  so she can more easily be active with the oxygen. Patient has only attended 5 exercise sessions--will cont. to monitor and progress as able.   Patient is progressing slowly. Patient is able to walk up to 9 laps (2104f) each in 15 minutes. Will cont. to monitor and progress as able.      Expected Outcomes  Through exercise at rehab and at home, patient will increase strength and stamina and will find that ADL's are easier to preform.   Through exercise at rehab and at home, patient will increase strength and stamina and find that ADL's are easier to preform.   Through exercise at rehab and at home, patient will increase strength and stamina and find that ADL's are easier to preform.         Discharge Exercise Prescription (Final Exercise Prescription Changes): Exercise Prescription Changes - 11/18/17 1600      Response to Exercise   Blood Pressure (Admit)  128/60    Blood Pressure (Exercise)  154/74    Blood Pressure (Exit)  110/64    Heart Rate (Admit)  93 bpm    Heart Rate (Exercise)  117 bpm    Heart Rate (Exit)  104 bpm    Oxygen Saturation (Admit)  98 %    Oxygen Saturation (Exercise)  93 %    Oxygen Saturation (Exit)  98 %    Rating of Perceived Exertion (Exercise)  13    Perceived Dyspnea (Exercise)  1    Duration  Progress to 45 minutes of aerobic exercise without signs/symptoms of physical distress    Intensity  THRR unchanged      Progression   Progression  Continue to progress workloads to maintain intensity without signs/symptoms of physical distress.      Resistance Training   Training Prescription  Yes    Weight  orange bands    Reps  10-15    Time  10 Minutes      Oxygen   Oxygen  Continuous    Liters  2      Bike   Level  0.4    Minutes  17       Track   Laps  12    Minutes  17       Nutrition:  Target Goals: Understanding of nutrition guidelines, daily intake of sodium <15069m cholesterol <20039mcalories 30% from fat and 7% or less from saturated fats, daily to have 5 or more servings of fruits and vegetables.  Biometrics: Pre Biometrics - 10/05/17 0930      Pre Biometrics   Grip Strength  24 kg        Nutrition Therapy Plan and Nutrition Goals: Nutrition Therapy & Goals - 10/29/17 1411      Nutrition Therapy   Diet  Heart Healthy      Personal Nutrition Goals   Nutrition Goal  Identify food quantities necessary to achieve wt loss of  -2# per week to a goal wt loss of 6-24 lb at graduation from pulmonary rehab.    Personal Goal #2  Describe the benefit of including fruits, vegetables, whole grains, and low-fat dairy products in a healthy meal plan.      Intervention Plan   Intervention  Prescribe, educate and counsel regarding individualized specific dietary modifications aiming towards targeted core components such as weight, hypertension, lipid management, diabetes, heart failure and other comorbidities.    Expected Outcomes  Short Term Goal: Understand basic principles of dietary content, such as calories, fat,  sodium, cholesterol and nutrients.;Long Term Goal: Adherence to prescribed nutrition plan.       Nutrition Discharge: Rate Your Plate Scores: Nutrition Assessments - 10/29/17 1407      Rate Your Plate Scores   Pre Score  42       Nutrition Goals Re-Evaluation:   Nutrition Goals Discharge (Final Nutrition Goals Re-Evaluation):   Psychosocial: Target Goals: Acknowledge presence or absence of significant depression and/or stress, maximize coping skills, provide positive support system. Participant is able to verbalize types and ability to use techniques and skills needed for reducing stress and depression.  Initial Review & Psychosocial Screening: Initial Psych Review & Screening - 10/05/17  0911      Initial Review   Current issues with  None Identified      Family Dynamics   Good Support System?  Yes      Barriers   Psychosocial barriers to participate in program  There are no identifiable barriers or psychosocial needs.      Screening Interventions   Interventions  Encouraged to exercise       Quality of Life Scores:   PHQ-9: Recent Review Flowsheet Data    Depression screen [facility] 2/9 10/05/2017 05/31/2017 03/12/2016 03/12/2016 07/27/2014   Decreased Interest 0 0 0 0 0   Down, Depressed, Hopeless 0 0 0 0 0   PHQ - 2 Score 0 0 0 0 0   Altered sleeping 0 - - - -   Tired, decreased energy 0 - - - -   Change in appetite 0 - - - -   Feeling bad or failure about yourself  0 - - - -   Trouble concentrating 0 - - - -   Moving slowly or fidgety/restless 0 - - - -   Suicidal thoughts 0 - - - -   PHQ-9 Score 0 - - - -   Difficult doing work/chores Not difficult at all - - - -     Interpretation of Total Score  Total Score Depression Severity:  1-4 = Minimal depression, 5-9 = Mild depression, 10-14 = Moderate depression, 15-19 = Moderately severe depression, 20-27 = Severe depression   Psychosocial Evaluation and Intervention: Psychosocial Evaluation - 10/05/17 0912      Psychosocial Evaluation & Interventions   Interventions  Encouraged to exercise with the program and follow exercise prescription    Expected Outcomes  patient will remain free from psychosocial barriers to participation in pulmonary rehab    Continue Psychosocial Services   No Follow up required       Psychosocial Re-Evaluation: Psychosocial Re-Evaluation    Row Name 10/12/17 0955 11/02/17 0956 11/19/17 0901         Psychosocial Re-Evaluation   Current issues with  None Identified  None Identified  None Identified     Interventions  Encouraged to attend Pulmonary Rehabilitation for the exercise  Encouraged to attend Pulmonary Rehabilitation for the exercise  Encouraged to attend Pulmonary  Rehabilitation for the exercise     Continue Psychosocial Services   No Follow up required  No Follow up required  No Follow up required        Psychosocial Discharge (Final Psychosocial Re-Evaluation): Psychosocial Re-Evaluation - 11/19/17 0901      Psychosocial Re-Evaluation   Current issues with  None Identified    Interventions  Encouraged to attend Pulmonary Rehabilitation for the exercise    Continue Psychosocial Services   No Follow up required       Education: Education  Goals: Education classes will be provided on a weekly basis, covering required topics. Participant will state understanding/return demonstration of topics presented.  Learning Barriers/Preferences: Learning Barriers/Preferences - 10/05/17 4656      Learning Barriers/Preferences   Learning Barriers  None    Learning Preferences  Computer/Internet;Group Instruction;Individual Instruction;Skilled Demonstration;Verbal Instruction;Video;Written Material       Education Topics: Risk Factor Reduction:  -Group instruction that is supported by a PowerPoint presentation. Instructor discusses the definition of a risk factor, different risk factors for pulmonary disease, and how the heart and lungs work together.     Nutrition for Pulmonary Patient:  -Group instruction provided by PowerPoint slides, verbal discussion, and written materials to support subject matter. The instructor gives an explanation and review of healthy diet recommendations, which includes a discussion on weight management, recommendations for fruit and vegetable consumption, as well as protein, fluid, caffeine, fiber, sodium, sugar, and alcohol. Tips for eating when patients are short of breath are discussed.   Pursed Lip Breathing:  -Group instruction that is supported by demonstration and informational handouts. Instructor discusses the benefits of pursed lip and diaphragmatic breathing and detailed demonstration on how to preform both.      Oxygen Safety:  -Group instruction provided by PowerPoint, verbal discussion, and written material to support subject matter. There is an overview of "What is Oxygen" and "Why do we need it".  Instructor also reviews how to create a safe environment for oxygen use, the importance of using oxygen as prescribed, and the risks of noncompliance. There is a brief discussion on traveling with oxygen and resources the patient may utilize.   Oxygen Equipment:  -Group instruction provided by [facility] Staff utilizing handouts, written materials, and equipment demonstrations.   PULMONARY REHAB OTHER RESPIRATORY from 11/18/2017 in [place]  Date  10/28/17  Educator  Ace Gins  Instruction Review Code  2- meets goals/outcomes      Signs and Symptoms:  -Group instruction provided by written material and verbal discussion to support subject matter. Warning signs and symptoms of infection, stroke, and heart attack are reviewed and when to call the physician/911 reinforced. Tips for preventing the spread of infection discussed.   Advanced Directives:  -Group instruction provided by verbal instruction and written material to support subject matter. Instructor reviews Advanced Directive laws and proper instruction for filling out document.   Pulmonary Video:  -Group video education that reviews the importance of medication and oxygen compliance, exercise, good nutrition, pulmonary hygiene, and pursed lip and diaphragmatic breathing for the pulmonary patient.   Exercise for the Pulmonary Patient:  -Group instruction that is supported by a PowerPoint presentation. Instructor discusses benefits of exercise, core components of exercise, frequency, duration, and intensity of an exercise routine, importance of utilizing pulse oximetry during exercise, safety while exercising, and options of places to exercise outside of rehab.     Pulmonary Medications:  -Verbally  interactive group education provided by instructor with focus on inhaled medications and proper administration.   Anatomy and Physiology of the Respiratory System and Intimacy:  -Group instruction provided by PowerPoint, verbal discussion, and written material to support subject matter. Instructor reviews respiratory cycle and anatomical components of the respiratory system and their functions. Instructor also reviews differences in obstructive and restrictive respiratory diseases with examples of each. Intimacy, Sex, and Sexuality differences are reviewed with a discussion on how relationships can change when diagnosed with pulmonary disease. Common sexual concerns are reviewed.   PULMONARY REHAB OTHER RESPIRATORY from  11/18/2017 in [place]  Date  11/18/17  Educator  rn  Instruction Review Code  2- meets goals/outcomes      MD DAY -A group question and answer session with a medical doctor that allows participants to ask questions that relate to their pulmonary disease state.   OTHER EDUCATION -Group or individual verbal, written, or video instructions that support the educational goals of the pulmonary rehab program.   PULMONARY REHAB OTHER RESPIRATORY from 11/18/2017 in [place]  Date  10/07/17 [IRJJOAC Eating]  Educator  Parke Simmers  Instruction Review Code  1- Verbalizes Understanding      Knowledge Questionnaire Score: Knowledge Questionnaire Score - 10/05/17 0909      Knowledge Questionnaire Score   Pre Score  11/13       Core Components/Risk Factors/Patient Goals at Admission: Personal Goals and Risk Factors at Admission - 10/05/17 0910      Core Components/Risk Factors/Patient Goals on Admission   Improve shortness of breath with ADL's  Yes    Intervention  Provide education, individualized exercise plan and daily activity instruction to help decrease symptoms of SOB with activities of daily living.     Expected Outcomes  Short Term: Achieves a reduction of symptoms when performing activities of daily living.    Develop more efficient breathing techniques such as purse lipped breathing and diaphragmatic breathing; and practicing self-pacing with activity  Yes    Intervention  Provide education, demonstration and support about specific breathing techniuqes utilized for more efficient breathing. Include techniques such as pursed lipped breathing, diaphragmatic breathing and self-pacing activity.    Expected Outcomes  Short Term: Participant will be able to demonstrate and use breathing techniques as needed throughout daily activities.    Increase knowledge of respiratory medications and ability to use respiratory devices properly   Yes    Intervention  Provide education and demonstration as needed of appropriate use of medications, inhalers, and oxygen therapy.    Expected Outcomes  Short Term: Achieves understanding of medications use. Understands that oxygen is a medication prescribed by physician. Demonstrates appropriate use of inhaler and oxygen therapy.       Core Components/Risk Factors/Patient Goals Review:  Goals and Risk Factor Review    Row Name 10/12/17 0954 11/02/17 0954 11/19/17 0901         Core Components/Risk Factors/Patient Goals Review   Personal Goals Review  Improve shortness of breath with ADL's;Increase knowledge of respiratory medications and ability to use respiratory devices properly.;Develop more efficient breathing techniques such as purse lipped breathing and diaphragmatic breathing and practicing self-pacing with activity.  Improve shortness of breath with ADL's;Develop more efficient breathing techniques such as purse lipped breathing and diaphragmatic breathing and practicing self-pacing with activity.  Improve shortness of breath with ADL's;Develop more efficient breathing techniques such as purse lipped breathing and diaphragmatic breathing and practicing self-pacing  with activity.     Review  Has only attended 1 exercise session, too early to see progression toward goals  3rd week of exercise, workloads have been increased, responding appropriately  continues to increase workloads as tolerated     Expected Outcomes  Should see progression of goals stated in the next 30 days  continued increase in workloads with appropriate response  see admission goals        Core Components/Risk Factors/Patient Goals at Discharge (Final Review):  Goals and Risk Factor Review - 11/19/17 0901      Core Components/Risk Factors/Patient Goals  Review   Personal Goals Review  Improve shortness of breath with ADL's;Develop more efficient breathing techniques such as purse lipped breathing and diaphragmatic breathing and practicing self-pacing with activity.    Review  continues to increase workloads as tolerated    Expected Outcomes  see admission goals       ITP Comments:   Comments: ITP REVIEW Pt is making expected progress toward pulmonary rehab goals after completing 10 sessions. Recommend continued exercise, life style modification, education, and utilization of breathing techniques to increase stamina and strength and decrease shortness of breath with exertion.

## 2017-11-26 NOTE — Telephone Encounter (Signed)
Left message for patient. There are several places that she will need to sign as well as provide copies of her income statements.

## 2017-12-01 NOTE — Telephone Encounter (Signed)
Spoke with pt. She states that she has already filled out her portion of these forms. They were mailed to her home. She has sent them back along with her income statements. Will route message to [facility] for follow up.

## 2017-12-02 ENCOUNTER — Encounter (HOSPITAL_COMMUNITY): Payer: Medicare Other

## 2017-12-02 ENCOUNTER — Encounter (HOSPITAL_COMMUNITY)
Admission: RE | Admit: 2017-12-02 | Discharge: 2017-12-02 | Disposition: A | Discharge status: Home / Self Care | Payer: Medicare Other | Source: Ambulatory Visit | Attending: Pulmonary Disease | Admitting: Pulmonary Disease

## 2017-12-02 DIAGNOSIS — Z85118 Personal history of other malignant neoplasm of bronchus and lung: Secondary | ICD-10-CM | POA: Diagnosis not present

## 2017-12-02 DIAGNOSIS — I10 Essential (primary) hypertension: Secondary | ICD-10-CM | POA: Insufficient documentation

## 2017-12-02 DIAGNOSIS — Z87891 Personal history of nicotine dependence: Secondary | ICD-10-CM | POA: Diagnosis not present

## 2017-12-02 DIAGNOSIS — J8410 Pulmonary fibrosis, unspecified: Secondary | ICD-10-CM | POA: Diagnosis not present

## 2017-12-02 DIAGNOSIS — G8929 Other chronic pain: Secondary | ICD-10-CM | POA: Diagnosis not present

## 2017-12-02 DIAGNOSIS — Z79899 Other long term (current) drug therapy: Secondary | ICD-10-CM | POA: Diagnosis not present

## 2017-12-02 DIAGNOSIS — E785 Hyperlipidemia, unspecified: Secondary | ICD-10-CM | POA: Insufficient documentation

## 2017-12-02 DIAGNOSIS — M1990 Unspecified osteoarthritis, unspecified site: Secondary | ICD-10-CM | POA: Insufficient documentation

## 2017-12-02 DIAGNOSIS — Z96641 Presence of right artificial hip joint: Secondary | ICD-10-CM | POA: Diagnosis not present

## 2017-12-02 DIAGNOSIS — M545 Low back pain: Secondary | ICD-10-CM | POA: Insufficient documentation

## 2017-12-02 NOTE — Progress Notes (Signed)
Daily Session Note  Patient Details  Name: [name] [name] MRN: [id number] Date of Birth: November 14, 1941 Referring Provider:     PULMONARY REHAB OTHER RESP ORIENTATION from 10/05/2017 in [place]  Referring Provider  Dr. Elsworth Soho      Encounter Date: 12/02/2017  Check In: Session Check In - 12/02/17 1330      Check-In   Location  [facility]    Staff Present  Rosebud Poles, RN, BSN;Lisa Ysidro Evert, RN;Portia Rollene Rotunda, RN, BSN;Molly diVincenzo, MS, ACSM RCEP, Exercise Physiologist    Supervising physician immediately available to respond to emergencies  [facility] immediately available    Physician(s)  Dr. Eliseo Squires    Medication changes reported      No    Fall or balance concerns reported     No    Tobacco Cessation  No Change    Warm-up and Cool-down  Performed as group-led instruction    Resistance Training Performed  Yes    VAD Patient?  No      Pain Assessment   Currently in Pain?  No/denies    Multiple Pain Sites  No       Capillary Blood Glucose: No results found for this or any previous visit (from the past 24 hour(s)).  Exercise Prescription Changes - 12/02/17 1600      Response to Exercise   Blood Pressure (Admit)  140/64    Blood Pressure (Exercise)  182/80    Blood Pressure (Exit)  140/80    Heart Rate (Admit)  109 bpm    Heart Rate (Exercise)  133 bpm    Heart Rate (Exit)  103 bpm    Oxygen Saturation (Admit)  97 %    Oxygen Saturation (Exercise)  94 %    Oxygen Saturation (Exit)  97 %    Rating of Perceived Exertion (Exercise)  14    Perceived Dyspnea (Exercise)  3    Duration  Progress to 45 minutes of aerobic exercise without signs/symptoms of physical distress    Intensity  THRR unchanged      Progression   Progression  Continue to progress workloads to maintain intensity without signs/symptoms of physical distress.      Resistance Training   Training Prescription  Yes    Weight  orange bands    Reps  10-15     Time  10 Minutes      Oxygen   Oxygen  Continuous    Liters  2      Bike   Level  0.6    Minutes  17      Track   Laps  11    Minutes  17       Social History   Tobacco Use  Smoking Status Former Smoker  . Packs/day: 1.00  . Years: 40.00  . Pack years: 40.00  . Types: Cigarettes  . Last attempt to quit: 11/03/2012  . Years since quitting: 5.0  Smokeless Tobacco Never Used    Goals Met:  Exercise tolerated well Strength training completed today  Goals Unmet:  Not Applicable  Comments: Service time is from 1330 to [place]    Dr. Rush Farmer is Medical Director for Pulmonary Rehab at [facility].

## 2017-12-06 ENCOUNTER — Ambulatory Visit (INDEPENDENT_AMBULATORY_CARE_PROVIDER_SITE_OTHER)
Admission: RE | Admit: 2017-12-06 | Discharge: 2017-12-06 | Disposition: A | Discharge status: Home / Self Care | Payer: Medicare Other | Source: Ambulatory Visit | Attending: Adult Health | Admitting: Adult Health

## 2017-12-06 DIAGNOSIS — R918 Other nonspecific abnormal finding of lung field: Secondary | ICD-10-CM | POA: Diagnosis not present

## 2017-12-06 DIAGNOSIS — C3412 Malignant neoplasm of upper lobe, left bronchus or lung: Secondary | ICD-10-CM | POA: Diagnosis not present

## 2017-12-06 DIAGNOSIS — R911 Solitary pulmonary nodule: Secondary | ICD-10-CM | POA: Diagnosis not present

## 2017-12-06 DIAGNOSIS — C3490 Malignant neoplasm of unspecified part of unspecified bronchus or lung: Secondary | ICD-10-CM | POA: Diagnosis not present

## 2017-12-07 ENCOUNTER — Encounter (HOSPITAL_COMMUNITY): Payer: Medicare Other

## 2017-12-09 ENCOUNTER — Encounter (HOSPITAL_COMMUNITY): Payer: Medicare Other

## 2017-12-09 ENCOUNTER — Encounter (HOSPITAL_COMMUNITY)
Admission: RE | Admit: 2017-12-09 | Discharge: 2017-12-09 | Disposition: A | Discharge status: Home / Self Care | Payer: Medicare Other | Source: Ambulatory Visit | Attending: Pulmonary Disease | Admitting: Pulmonary Disease

## 2017-12-09 VITALS — Wt 135.6 lb

## 2017-12-09 DIAGNOSIS — I10 Essential (primary) hypertension: Secondary | ICD-10-CM | POA: Diagnosis not present

## 2017-12-09 DIAGNOSIS — J8410 Pulmonary fibrosis, unspecified: Secondary | ICD-10-CM

## 2017-12-09 DIAGNOSIS — Z85118 Personal history of other malignant neoplasm of bronchus and lung: Secondary | ICD-10-CM | POA: Diagnosis not present

## 2017-12-09 DIAGNOSIS — Z87891 Personal history of nicotine dependence: Secondary | ICD-10-CM | POA: Diagnosis not present

## 2017-12-09 DIAGNOSIS — Z79899 Other long term (current) drug therapy: Secondary | ICD-10-CM | POA: Diagnosis not present

## 2017-12-09 DIAGNOSIS — E785 Hyperlipidemia, unspecified: Secondary | ICD-10-CM | POA: Diagnosis not present

## 2017-12-09 NOTE — Progress Notes (Signed)
Daily Session Note  Patient Details  Name: [name] [name] MRN: [id number] Date of Birth: November 16, 1941 Referring Provider:     PULMONARY REHAB OTHER RESP ORIENTATION from 10/05/2017 in [place]  Referring Provider  Dr. Elsworth Soho      Encounter Date: 12/09/2017  Check In: Session Check In - 12/09/17 1330      Check-In   Location  [facility]    Staff Present  Rosebud Poles, RN, BSN;Lisa Ysidro Evert, RN;Annesha Delgreco Rollene Rotunda, RN, BSN;Molly diVincenzo, MS, ACSM RCEP, Exercise Physiologist    Supervising physician immediately available to respond to emergencies  [facility] immediately available    Physician(s)  Dr. Cruzita Lederer    Medication changes reported      No    Fall or balance concerns reported     No    Tobacco Cessation  No Change    Warm-up and Cool-down  Performed as group-led instruction    Resistance Training Performed  Yes    VAD Patient?  No      Pain Assessment   Currently in Pain?  No/denies    Multiple Pain Sites  No       Capillary Blood Glucose: No results found for this or any previous visit (from the past 24 hour(s)).  Exercise Prescription Changes - 12/09/17 1547      Response to Exercise   Blood Pressure (Admit)  150/60    Blood Pressure (Exercise)  168/68    Blood Pressure (Exit)  134/70    Heart Rate (Admit)  106 bpm    Heart Rate (Exercise)  124 bpm    Heart Rate (Exit)  103 bpm    Oxygen Saturation (Admit)  96 %    Oxygen Saturation (Exercise)  90 %    Oxygen Saturation (Exit)  97 %    Rating of Perceived Exertion (Exercise)  13    Perceived Dyspnea (Exercise)  1    Duration  Progress to 45 minutes of aerobic exercise without signs/symptoms of physical distress    Intensity  THRR unchanged      Progression   Progression  Continue to progress workloads to maintain intensity without signs/symptoms of physical distress.      Resistance Training   Training Prescription  Yes    Weight  orange bands    Reps   10-15    Time  10 Minutes      Oxygen   Oxygen  Continuous    Liters  2      Bike   Level  0.6    Minutes  17      NuStep   Level  6    Minutes  17       Social History   Tobacco Use  Smoking Status Former Smoker  . Packs/day: 1.00  . Years: 40.00  . Pack years: 40.00  . Types: Cigarettes  . Last attempt to quit: 11/03/2012  . Years since quitting: 5.1  Smokeless Tobacco Never Used    Goals Met:  Independence with exercise equipment Improved SOB with ADL's Using PLB without cueing & demonstrates good technique Exercise tolerated well No report of cardiac concerns or symptoms Strength training completed today  Goals Unmet:  Not Applicable  Comments: Service time is from 1330 to 1520   Dr. Rush Farmer is Medical Director for Pulmonary Rehab at [facility].

## 2017-12-10 ENCOUNTER — Ambulatory Visit: Payer: Self-pay | Admitting: Pulmonary Disease

## 2017-12-13 ENCOUNTER — Telehealth: Payer: Self-pay | Admitting: Pulmonary Disease

## 2017-12-13 NOTE — Telephone Encounter (Signed)
[name] [name] did you get this form?

## 2017-12-14 ENCOUNTER — Encounter (HOSPITAL_COMMUNITY): Payer: Medicare Other

## 2017-12-14 ENCOUNTER — Encounter (HOSPITAL_COMMUNITY)
Admission: RE | Admit: 2017-12-14 | Discharge: 2017-12-14 | Disposition: A | Discharge status: Home / Self Care | Payer: Medicare Other | Source: Ambulatory Visit | Attending: Pulmonary Disease | Admitting: Pulmonary Disease

## 2017-12-14 VITALS — Wt 135.6 lb

## 2017-12-14 DIAGNOSIS — Z87891 Personal history of nicotine dependence: Secondary | ICD-10-CM | POA: Diagnosis not present

## 2017-12-14 DIAGNOSIS — J8410 Pulmonary fibrosis, unspecified: Secondary | ICD-10-CM | POA: Diagnosis not present

## 2017-12-14 DIAGNOSIS — Z79899 Other long term (current) drug therapy: Secondary | ICD-10-CM | POA: Diagnosis not present

## 2017-12-14 DIAGNOSIS — Z85118 Personal history of other malignant neoplasm of bronchus and lung: Secondary | ICD-10-CM | POA: Diagnosis not present

## 2017-12-14 DIAGNOSIS — I10 Essential (primary) hypertension: Secondary | ICD-10-CM | POA: Diagnosis not present

## 2017-12-14 DIAGNOSIS — E785 Hyperlipidemia, unspecified: Secondary | ICD-10-CM | POA: Diagnosis not present

## 2017-12-14 NOTE — Progress Notes (Signed)
Daily Session Note  Patient Details  Name: [name] [name] MRN: [id number] Date of Birth: 03-Apr-1941 Referring Provider:     PULMONARY REHAB OTHER RESP ORIENTATION from 10/05/2017 in [place]  Referring Provider  Dr. Elsworth Soho      Encounter Date: 12/14/2017  Check In: Session Check In - 12/14/17 1330      Check-In   Location  [facility]    Staff Present  Rosebud Poles, RN, Luisa Hart, RN, BSN;Yesli Vanderhoff, MS, ACSM RCEP, Exercise Physiologist;Lisa Ysidro Evert, RN    Supervising physician immediately available to respond to emergencies  [facility] immediately available    Physician(s)  Dr. Eliseo Squires    Medication changes reported      No    Fall or balance concerns reported     No    Tobacco Cessation  No Change    Warm-up and Cool-down  Performed as group-led instruction    Resistance Training Performed  Yes    VAD Patient?  No      Pain Assessment   Currently in Pain?  No/denies    Multiple Pain Sites  No       Capillary Blood Glucose: No results found for this or any previous visit (from the past 24 hour(s)).  Exercise Prescription Changes - 12/14/17 1500      Response to Exercise   Blood Pressure (Admit)  138/60    Blood Pressure (Exercise)  170/78    Blood Pressure (Exit)  132/60    Heart Rate (Admit)  114 bpm    Heart Rate (Exercise)  126 bpm    Heart Rate (Exit)  102 bpm    Oxygen Saturation (Admit)  98 %    Oxygen Saturation (Exercise)  93 %    Oxygen Saturation (Exit)  98 %    Rating of Perceived Exertion (Exercise)  13    Perceived Dyspnea (Exercise)  2    Duration  Progress to 45 minutes of aerobic exercise without signs/symptoms of physical distress    Intensity  THRR unchanged      Progression   Progression  Continue to progress workloads to maintain intensity without signs/symptoms of physical distress.      Resistance Training   Training Prescription  Yes    Weight  orange bands    Reps  10-15     Time  10 Minutes      Oxygen   Oxygen  Continuous    Liters  2      Bike   Level  0.6    Minutes  17      NuStep   Level  6    Minutes  17    METs  2.5      Track   Laps  10    Minutes  17       Social History   Tobacco Use  Smoking Status Former Smoker  . Packs/day: 1.00  . Years: 40.00  . Pack years: 40.00  . Types: Cigarettes  . Last attempt to quit: 11/03/2012  . Years since quitting: 5.1  Smokeless Tobacco Never Used    Goals Met:  Exercise tolerated well Queuing for purse lip breathing No report of cardiac concerns or symptoms  Goals Unmet:  Not Applicable  Comments: Service time is from 1:30p to 2:45p    Dr. Rush Farmer is Medical Director for Pulmonary Rehab at [facility].

## 2017-12-14 NOTE — Telephone Encounter (Signed)
Looked through Cherina's papers to see if the form had been faxed over and did not see it. Called [employer] to see if they could refax the form to our office.

## 2017-12-16 ENCOUNTER — Encounter (HOSPITAL_COMMUNITY): Payer: Medicare Other

## 2017-12-16 ENCOUNTER — Encounter (HOSPITAL_COMMUNITY)
Admission: RE | Admit: 2017-12-16 | Discharge: 2017-12-16 | Disposition: A | Discharge status: Home / Self Care | Payer: Medicare Other | Source: Ambulatory Visit | Attending: Pulmonary Disease | Admitting: Pulmonary Disease

## 2017-12-16 DIAGNOSIS — Z87891 Personal history of nicotine dependence: Secondary | ICD-10-CM | POA: Diagnosis not present

## 2017-12-16 DIAGNOSIS — I10 Essential (primary) hypertension: Secondary | ICD-10-CM | POA: Diagnosis not present

## 2017-12-16 DIAGNOSIS — Z79899 Other long term (current) drug therapy: Secondary | ICD-10-CM | POA: Diagnosis not present

## 2017-12-16 DIAGNOSIS — J8410 Pulmonary fibrosis, unspecified: Secondary | ICD-10-CM | POA: Diagnosis not present

## 2017-12-16 DIAGNOSIS — E785 Hyperlipidemia, unspecified: Secondary | ICD-10-CM | POA: Diagnosis not present

## 2017-12-16 DIAGNOSIS — Z85118 Personal history of other malignant neoplasm of bronchus and lung: Secondary | ICD-10-CM | POA: Diagnosis not present

## 2017-12-16 NOTE — Progress Notes (Signed)
Daily Session Note  Patient Details  Name: [name] [name] MRN: [id number] Date of Birth: 10-23-1941 Referring Provider:     PULMONARY REHAB OTHER RESP ORIENTATION from 10/05/2017 in [place]  Referring Provider  Dr. Elsworth Soho      Encounter Date: 12/16/2017  Check In: Session Check In - 12/16/17 1330      Check-In   Location  [facility]    Staff Present  Rosebud Poles, RN, Luisa Hart, RN, BSN;Molly diVincenzo, MS, ACSM RCEP, Exercise Physiologist;Lisa Ysidro Evert, RN    Supervising physician immediately available to respond to emergencies  [facility] immediately available    Physician(s)  Dr. Eliseo Squires    Medication changes reported      No    Fall or balance concerns reported     No    Tobacco Cessation  No Change    Warm-up and Cool-down  Performed as group-led instruction    Resistance Training Performed  Yes    VAD Patient?  No      Pain Assessment   Currently in Pain?  No/denies    Multiple Pain Sites  No       Capillary Blood Glucose: No results found for this or any previous visit (from the past 24 hour(s)).  Exercise Prescription Changes - 12/16/17 1500      Response to Exercise   Blood Pressure (Admit)  168/80    Blood Pressure (Exercise)  190/84    Blood Pressure (Exit)  124/60    Heart Rate (Admit)  92 bpm    Heart Rate (Exercise)  126 bpm    Heart Rate (Exit)  101 bpm    Oxygen Saturation (Admit)  97 %    Oxygen Saturation (Exercise)  92 %    Oxygen Saturation (Exit)  97 %    Rating of Perceived Exertion (Exercise)  13    Perceived Dyspnea (Exercise)  1    Duration  Progress to 45 minutes of aerobic exercise without signs/symptoms of physical distress    Intensity  THRR unchanged      Progression   Progression  Continue to progress workloads to maintain intensity without signs/symptoms of physical distress.      Resistance Training   Training Prescription  Yes    Weight  orange bands    Reps  10-15     Time  10 Minutes      Oxygen   Oxygen  Continuous    Liters  2      NuStep   Level  6    Minutes  17    METs  2.6      Track   Laps  10    Minutes  17       Social History   Tobacco Use  Smoking Status Former Smoker  . Packs/day: 1.00  . Years: 40.00  . Pack years: 40.00  . Types: Cigarettes  . Last attempt to quit: 11/03/2012  . Years since quitting: 5.1  Smokeless Tobacco Never Used    Goals Met:  Exercise tolerated well Strength training completed today  Goals Unmet:  Not Applicable  Comments: Service time is from 1330 to 1515    Dr. Rush Farmer is Medical Director for Pulmonary Rehab at [facility].

## 2017-12-16 NOTE — Telephone Encounter (Signed)
I have looked through RA's look at and these forms are not in that location. I have called [employer] to have them refax the form.

## 2017-12-17 ENCOUNTER — Ambulatory Visit (INDEPENDENT_AMBULATORY_CARE_PROVIDER_SITE_OTHER): Payer: Medicare Other | Admitting: Adult Health

## 2017-12-17 ENCOUNTER — Encounter: Payer: Self-pay | Admitting: Adult Health

## 2017-12-17 DIAGNOSIS — J8410 Pulmonary fibrosis, unspecified: Secondary | ICD-10-CM | POA: Diagnosis not present

## 2017-12-17 DIAGNOSIS — J9611 Chronic respiratory failure with hypoxia: Secondary | ICD-10-CM | POA: Diagnosis not present

## 2017-12-17 DIAGNOSIS — R911 Solitary pulmonary nodule: Secondary | ICD-10-CM | POA: Diagnosis not present

## 2017-12-17 MED ORDER — PREDNISONE 5 MG PO TABS: ORAL_TABLET | ORAL | 0 refills | Status: DC

## 2017-12-17 NOTE — Telephone Encounter (Signed)
Papers were faxed to [employer] on 12/14/17.  Will close this message.

## 2017-12-17 NOTE — Assessment & Plan Note (Signed)
Recent flare now resolving with low dose steroids . Awaiting OFEV approval . Hope to wean off Steroids soon .   Plan  Patient Instructions  Begin Prednisone 10mg  alternating 5mg  daily for 2 weeks then 5mg  daily . Hold at this dose until seen back .  Continue OFEV approval process.  Continue on Oxygen 2 l/m rest and 3l/m walking  Continue with pulmonary rehab.  Follow up in 6 weeks with Dr. Elsworth Soho   And As needed  .  Please contact office for sooner follow up if symptoms do not improve or worsen or seek emergency care

## 2017-12-17 NOTE — Progress Notes (Signed)
@Patient  ID: [name] [name], female    DOB: October 29, 1941, 77 y.o.   MRN: [id number]  Chief Complaint  Patient presents with  . Follow-up    IPF    Referring provider: Alroy Dust, L.Marlou Sa, MD  HPI: [id number] ex-smoker With a history of lung cancer for FUof interstitial pneumonia/ ILD  Significant tests/ events reviewed 10/2012 >>ARDS Requiring mechanical ventilation  PFTs 01/2013 - no obstruction, mild restriction, FVC 77%, DLCO decreased at 57%  PFTs 06/2017 show moderate restriction with ratio 93, FVC 56%, FEV1 70%, TLC 44% with DLCO 43%  PET 05/2017 subcarinal LN SUV 4.2  12/17/2017 Follow up : ILD  Patient presents for a 4-week follow-up.  Patient recently had a suspected ILD exacerbation.  She was treated in the hospital end o Oct . with IV steroids.  Recent CT chest 08/2017 and PFT showed ILD progression from 2015  with drop in lung function and increased oxygen demands.  She was seen last visit after her hospitalization.  She was having increased symptoms since stopping prednisone.  Prednisone was restarted at prednisone 20 mg for 2 weeks and 10 mg daily.  She has been recommended to begin OFEV .  The approval process is pending. Since last visit patient is feeling much better , breathing is back to baseline  No cough . Feels she could turn Oxygen down at times.  She remains on oxygen 2 L at rest and 3 L with walking.  She remains in pulmonary rehab. CT chest 11/2017 showed stable radiation fibrosis in left lung, no evidence of lung cancer recurrence, and stable ILD changes.    Allergies  Allergen Reactions  . Penicillins Hives and Swelling    Has patient had a PCN reaction causing immediate rash, facial/tongue/throat swelling, SOB or lightheadedness with hypotension: Yes Has patient had a PCN reaction causing severe rash involving mucus membranes or skin necrosis: No Has patient had a PCN reaction that required hospitalization: No Has patient had a PCN reaction occurring  within the last 10 years: No If all of the above answers are "NO", then may proceed with Cephalosporin use.     Immunization History  Administered Date(s) Administered  . Influenza Whole 08/30/2012  . Influenza-Unspecified 08/30/2016, 08/30/2017  . Pneumococcal Polysaccharide-23 12/01/2011    Past Medical History:  Diagnosis Date  . Anxiety   . Arthritis    a. 10/2012 s/p Right THA.  Marland Kitchen Arthritis    "hands" (09/21/2017)  . Chronic lower back pain   . Complication of anesthesia    "couldn't wake me up after my hip surgery" (09/21/2017)  . Depression   . History of ARDS   . Hyperlipidemia   . Hypertension   . Lung cancer ([place]) 02/16/13   LUL lung SQUAMOUS CELL  . Lung cancer ([place]) dx'd 12/2013    LLL ADENOCARCINOMA  . Pulmonary fibrosis ([place]) dx'd ~ 07/2017  . Pulmonary nodule    a. 10/2012 CT: 27mm anterior LUL nodule w/ spiculated appearance - rec PET CT.  . Seasonal allergies    "I take Claritin" (09/21/2017)  . Sinus headache     Tobacco History: Social History   Tobacco Use  Smoking Status Former Smoker  . Packs/day: 1.00  . Years: 40.00  . Pack years: 40.00  . Types: Cigarettes  . Last attempt to quit: 11/03/2012  . Years since quitting: 5.1  Smokeless Tobacco Never Used   Counseling given: Not Answered   Outpatient Encounter Medications as of 12/17/2017  Medication Sig  . acetaminophen (  TYLENOL) 500 MG tablet Take 1,000 mg by mouth every 6 (six) hours as needed for pain.  Marland Kitchen amLODipine (NORVASC) 10 MG tablet Take 1 tablet (10 mg total) by mouth daily.  Marland Kitchen atorvastatin (LIPITOR) 40 MG tablet Take 20 mg by mouth every morning.  . hydrochlorothiazide (HYDRODIURIL) 25 MG tablet Take 25 mg by mouth daily before breakfast.   . Hypromellose (ARTIFICIAL TEARS OP) Place 1 drop into both eyes daily.  Marland Kitchen loratadine (CLARITIN) 10 MG tablet Take 10 mg by mouth daily.  . predniSONE (DELTASONE) 10 MG tablet Take 2 tablets by mouth daily for 2 weeks, then 10mg  daily and hold  at this dose until seen back.   No facility-administered encounter medications on file as of 12/17/2017.      Review of Systems  Constitutional:   No  weight loss, night sweats,  Fevers, chills, + fatigue, or  lassitude.  HEENT:   No headaches,  Difficulty swallowing,  Tooth/dental problems, or  Sore throat,                No sneezing, itching, ear ache, nasal congestion, post nasal drip,   CV:  No chest pain,  Orthopnea, PND, swelling in lower extremities, anasarca, dizziness, palpitations, syncope.   GI  No heartburn, indigestion, abdominal pain, nausea, vomiting, diarrhea, change in bowel habits, loss of appetite, bloody stools.   Resp:    No chest wall deformity  Skin: no rash or lesions.  GU: no dysuria, change in color of urine, no urgency or frequency.  No flank pain, no hematuria   MS:  No joint pain or swelling.  No decreased range of motion.  No back pain.    Physical Exam  Pulse 92   Ht 5' 2.25" (1.581 m)   Wt 136 lb 6.4 oz (61.9 kg)   SpO2 95%   BMI 24.75 kg/m   GEN: A/Ox3; pleasant , NAD, elderly on o2    HEENT:  [place]/AT,  EACs-clear, TMs-wnl, NOSE-clear, THROAT-clear, no lesions, no postnasal drip or exudate noted.   NECK:  Supple w/ fair ROM; no JVD; normal carotid impulses w/o bruits; no thyromegaly or nodules palpated; no lymphadenopathy.    RESP  BB crackles , no accessory muscle use, no dullness to percussion  CARD:  RRR, no m/r/g, no peripheral edema, pulses intact, no cyanosis or clubbing.  GI:   Soft & nt; nml bowel sounds; no organomegaly or masses detected.   Musco: Warm bil, no deformities or joint swelling noted.   Neuro: alert, no focal deficits noted.    Skin: Warm, no lesions or rashes    Lab Results:  CBC  BMET  BNP  Imaging: Ct Chest Wo Contrast  Result Date: 12/06/2017 CLINICAL DATA:  Successful injury. Lung cancer. Pulmonary fibrosis. EXAM: CT CHEST WITHOUT CONTRAST TECHNIQUE: Multidetector CT imaging of the chest was  performed following the standard protocol without IV contrast. COMPARISON:  CT 08/31/2017, PET-CT 7258 FINDINGS: Cardiovascular: Coronary artery calcification and aortic atherosclerotic calcification. Trace pericardial thickening Mediastinum/Nodes: No axillary supraclavicular adenopathy. No mediastinal hilar adenopathy. Esophagus normal. Lungs/Pleura: The band of linear thickening in the LEFT mid lung unchanged from prior consistent radiation fibrosis. There is diffuse peripheral reticulation and ground-glass opacities unchanged from prior. There is traction bronchiectasis in the LEFT upper lobe and lower lobes. No frank honeycombing. Stable 5 mm nodule in the LEFT lower lobe (image 78, series 3). Upper Abdomen: Limited view of the liver, kidneys, pancreas are unremarkable. Normal adrenal glands. Musculoskeletal: No aggressive  osseous lesion. LEFT thoracotomy rib defect. IMPRESSION: 1. Band of presumed radiation fibrosis in the LEFT mid lung unchanged. 2. No evidence of lung cancer recurrence. Stable LEFT lower lobe nodule. 3. Stable interstitial lung disease favoring usual interstitial pneumonia. Electronically Signed   By: Suzy Bouchard M.D.   On: 12/06/2017 12:14     Assessment & Plan:   Chronic respiratory failure with hypoxia (HCC) Cont on O2   Pulmonary nodule Stable on recent CT chest   Pulmonary fibrosis (HCC) Recent flare now resolving with low dose steroids . Awaiting OFEV approval . Hope to wean off Steroids soon .   Plan  Patient Instructions  Begin Prednisone 10mg  alternating 5mg  daily for 2 weeks then 5mg  daily . Hold at this dose until seen back .  Continue OFEV approval process.  Continue on Oxygen 2 l/m rest and 3l/m walking  Continue with pulmonary rehab.  Follow up in 6 weeks with Dr. Elsworth Soho   And As needed  .  Please contact office for sooner follow up if symptoms do not improve or worsen or seek emergency care           Rexene Edison, NP 12/17/2017

## 2017-12-17 NOTE — Addendum Note (Signed)
Addended by: Parke Poisson E on: 12/17/2017 11:19 AM   Modules accepted: Orders

## 2017-12-17 NOTE — Patient Instructions (Addendum)
Begin Prednisone 10mg  alternating 5mg  daily for 2 weeks then 5mg  daily . Hold at this dose until seen back .  Continue OFEV approval process.  Continue on Oxygen 2 l/m rest and 3l/m walking  Continue with pulmonary rehab.  Follow up in 6 weeks with Dr. Elsworth Soho   And As needed  .  Please contact office for sooner follow up if symptoms do not improve or worsen or seek emergency care

## 2017-12-17 NOTE — Assessment & Plan Note (Signed)
Cont on O2 .  

## 2017-12-17 NOTE — Assessment & Plan Note (Signed)
Stable on recent CT chest

## 2017-12-20 DIAGNOSIS — E876 Hypokalemia: Secondary | ICD-10-CM | POA: Diagnosis not present

## 2017-12-21 ENCOUNTER — Encounter (HOSPITAL_COMMUNITY)
Admission: RE | Admit: 2017-12-21 | Discharge: 2017-12-21 | Disposition: A | Discharge status: Home / Self Care | Payer: Medicare Other | Source: Ambulatory Visit | Attending: Family Medicine | Admitting: Family Medicine

## 2017-12-21 ENCOUNTER — Encounter (HOSPITAL_COMMUNITY): Payer: Medicare Other

## 2017-12-21 VITALS — Wt 135.8 lb

## 2017-12-21 DIAGNOSIS — Z87891 Personal history of nicotine dependence: Secondary | ICD-10-CM | POA: Diagnosis not present

## 2017-12-21 DIAGNOSIS — Z85118 Personal history of other malignant neoplasm of bronchus and lung: Secondary | ICD-10-CM | POA: Diagnosis not present

## 2017-12-21 DIAGNOSIS — J8410 Pulmonary fibrosis, unspecified: Secondary | ICD-10-CM | POA: Diagnosis not present

## 2017-12-21 DIAGNOSIS — Z79899 Other long term (current) drug therapy: Secondary | ICD-10-CM | POA: Diagnosis not present

## 2017-12-21 DIAGNOSIS — E785 Hyperlipidemia, unspecified: Secondary | ICD-10-CM | POA: Diagnosis not present

## 2017-12-21 DIAGNOSIS — I10 Essential (primary) hypertension: Secondary | ICD-10-CM | POA: Diagnosis not present

## 2017-12-21 NOTE — Progress Notes (Signed)
Pulmonary Individual Treatment Plan  Patient Details  Name: [name] [name] MRN: [id number] Date of Birth: Jan 09, 1941 Referring Provider:     PULMONARY REHAB OTHER RESP ORIENTATION from 10/05/2017 in [place]  Referring Provider  Dr. Elsworth Soho      Initial Encounter Date:    [place] from 10/05/2017 in [place]  Date  10/05/17  Referring Provider  Dr. Elsworth Soho      Visit Diagnosis: Pulmonary fibrosis ([place])  Patient's Home Medications on Admission:   Current Outpatient Medications:  .  acetaminophen (TYLENOL) 500 MG tablet, Take 1,000 mg by mouth every 6 (six) hours as needed for pain., Disp: , Rfl:  .  amLODipine (NORVASC) 10 MG tablet, Take 1 tablet (10 mg total) by mouth daily., Disp: 30 tablet, Rfl: 0 .  atorvastatin (LIPITOR) 40 MG tablet, Take 20 mg by mouth every morning., Disp: , Rfl:  .  hydrochlorothiazide (HYDRODIURIL) 25 MG tablet, Take 25 mg by mouth daily before breakfast. , Disp: , Rfl:  .  Hypromellose (ARTIFICIAL TEARS OP), Place 1 drop into both eyes daily., Disp: , Rfl:  .  loratadine (CLARITIN) 10 MG tablet, Take 10 mg by mouth daily., Disp: , Rfl:  .  predniSONE (DELTASONE) 5 MG tablet, Alternate 47m and 526mevery other day for 2 weeks, then decrease to 59m37maily and hold at this dose., Disp: 50 tablet, Rfl: 0  Past Medical History: Past Medical History:  Diagnosis Date  . Anxiety   . Arthritis    a. 10/2012 s/p Right THA.  . AMarland Kitchenthritis    "hands" (09/21/2017)  . Chronic lower back pain   . Complication of anesthesia    "couldn't wake me up after my hip surgery" (09/21/2017)  . Depression   . History of ARDS   . Hyperlipidemia   . Hypertension   . Lung [place] ([facility]/20[place]   LUL lung SQUAMOUS CELL  . Lung [place] ([facility] [place]    LLL ADENOCARCINOMA  . Pulmonary fibrosis ([facility] ~ 07/2017  . Pulmonary nodule    a. 10/2012 CT: 50m41mterior LUL nodule w/  spiculated appearance - rec PET CT.  . Seasonal allergies    "I take Claritin" (09/21/2017)  . Sinus headache     Tobacco Use: Social History   Tobacco Use  Smoking Status Former Smoker  . Packs/day: 1.00  . Years: 40.00  . Pack years: 40.00  . Types: Cigarettes  . Last attempt to quit: 11/03/2012  . Years since quitting: 5.1  Smokeless Tobacco Never Used    Labs: Recent Review Flowsheet Data    Labs for ITP Cardiac and Pulmonary Rehab Latest Ref Rng & Units 11/11/2012 11/12/2012 11/12/2012 11/12/2012 11/12/2012   Trlycerides <150 mg/dL - - - - -   PHART 7.350 - 7.450 7.374 7.392 7.405 7.512(H) 7.473(H)   PCO2ART 35.0 - 45.0 mmHg 31.1(L) 51.1(H) 53.7(H) 47.8(H) 53.4(H)   HCO3 20.0 - 24.0 mEq/L 17.7(L) 30.8(H) 32.9(H) 38.1(H) 38.6(H)   TCO2 0 - 100 mmol/L 17.3 28.3 30.6 34.6 35.3   ACIDBASEDEF 0.0 - 2.0 mmol/L 6.4(H) - - - -   O2SAT % 94.2 93.3 94.4 98.8 99.5      Capillary Blood Glucose: Lab Results  Component Value Date   GLUCAP 98 09/24/2017   GLUCAP 100 (H) 09/24/2017   GLUCAP 142 (H) 09/23/2017   GLUCAP 131 (H) 09/23/2017   GLUCAP 125 (H) 09/23/2017     Pulmonary Assessment  Scores:   Pulmonary Function Assessment: Pulmonary Function Assessment - 10/05/17 0909      Breath   Bilateral Breath Sounds  Expiratory;Wheezes fine crackles in bases       Exercise Target Goals:    Exercise Program Goal: Individual exercise prescription set using results from initial 6 min walk test and THRR while considering  patient's activity barriers and safety.    Exercise Prescription Goal: Initial exercise prescription builds to 30-45 minutes a day of aerobic activity, 2-3 days per week.  Home exercise guidelines will be given to patient during program as part of exercise prescription that the participant will acknowledge.  Activity Barriers & Risk Stratification: Activity Barriers & Cardiac Risk Stratification - 10/05/17 0851      Activity Barriers & Cardiac Risk  Stratification   Activity Barriers  Shortness of Breath;Deconditioning;Back Problems       6 Minute Walk:   Oxygen Initial Assessment: Oxygen Initial Assessment - 10/05/17 1319      Initial 6 min Walk   Oxygen Used  Continuous;E-Tanks    Liters per minute  2      Program Oxygen Prescription   Program Oxygen Prescription  E-Tanks;Continuous    Liters per minute  2       Oxygen Re-Evaluation: Oxygen Re-Evaluation    Row Name 10/12/17 0951 11/02/17 0951 11/19/17 0900 12/20/17 1304       Program Oxygen Prescription   Program Oxygen Prescription  E-Tanks;Continuous  Continuous;E-Tanks  Continuous;E-Tanks  Continuous;E-Tanks    Liters per minute  _0 Home Oxygen   Home Oxygen Device  Portable Concentrator;E-Tanks  E-Tanks;Portable Concentrator  E-Tanks;Portable Concentrator  E-Tanks;Portable Concentrator    Sleep Oxygen Prescription  Continuous  Continuous  Continuous  Continuous    Liters per minute  _1 Home Exercise Oxygen Prescription  Continuous  Continuous  Continuous  Continuous    Liters per minute  _2 Home at Rest Exercise Oxygen Prescription  None  None  None  None    Compliance with Home Oxygen Use  Yes  Yes  Yes  Yes      Goals/Expected Outcomes   Short Term Goals  To learn and exhibit compliance with exercise, home and travel O2 prescription;To learn and understand importance of monitoring SPO2 with pulse oximeter and demonstrate accurate use of the pulse oximeter.;To learn and understand importance of maintaining oxygen saturations>88%;To learn and demonstrate proper pursed lip breathing techniques or other breathing techniques.;To learn and demonstrate proper use of respiratory medications  To learn and exhibit compliance with exercise, home and travel O2 prescription  To learn and exhibit compliance with exercise, home and travel O2 prescription  To learn and exhibit compliance with exercise, home and travel O2 prescription;To learn  and understand importance of monitoring SPO2 with pulse oximeter and demonstrate accurate use of the pulse oximeter.;To learn and understand importance of maintaining oxygen saturations>88%;To learn and demonstrate proper pursed lip breathing techniques or other breathing techniques.;To learn and demonstrate proper use of respiratory medications    Long  Term Goals  Exhibits compliance with exercise, home and travel O2 prescription;Verbalizes importance of monitoring SPO2 with pulse oximeter and return demonstration;Maintenance of O2 saturations>88%;Exhibits proper breathing techniques, such as pursed lip breathing or other method taught during program session;Compliance with respiratory medication  Exhibits compliance with exercise, home and travel O2 prescription  Exhibits compliance with exercise, home and travel O2 prescription  Exhibits compliance with exercise, home and travel O2 prescription;Demonstrates proper use of MDI's;Compliance with respiratory medication;Maintenance of O2 saturations>88%;Exhibits proper breathing techniques, such as pursed lip breathing or other method taught during program session;Verbalizes importance of monitoring SPO2 with pulse oximeter and return demonstration    Comments  Just began program, for now oxygen useage is as stated  compliant with oxygen useage  compliant with oxygen useage  compliant with oxygen useage    Goals/Expected Outcomes  compliance with oxygen useage  continued compliance  continued compliance  continued compliance       Oxygen Discharge (Final Oxygen Re-Evaluation): Oxygen Re-Evaluation - 12/20/17 1304      Program Oxygen Prescription   Program Oxygen Prescription  Continuous;E-Tanks    Liters per minute  2      Home Oxygen   Home Oxygen Device  E-Tanks;Portable Concentrator    Sleep Oxygen Prescription  Continuous    Liters per minute  2    Home Exercise Oxygen Prescription  Continuous    Liters per minute  2    Home at Rest  Exercise Oxygen Prescription  None    Compliance with Home Oxygen Use  Yes      Goals/Expected Outcomes   Short Term Goals  To learn and exhibit compliance with exercise, home and travel O2 prescription;To learn and understand importance of monitoring SPO2 with pulse oximeter and demonstrate accurate use of the pulse oximeter.;To learn and understand importance of maintaining oxygen saturations>88%;To learn and demonstrate proper pursed lip breathing techniques or other breathing techniques.;To learn and demonstrate proper use of respiratory medications    Long  Term Goals  Exhibits compliance with exercise, home and travel O2 prescription;Demonstrates proper use of MDI's;Compliance with respiratory medication;Maintenance of O2 saturations>88%;Exhibits proper breathing techniques, such as pursed lip breathing or other method taught during program session;Verbalizes importance of monitoring SPO2 with pulse oximeter and return demonstration    Comments  compliant with oxygen useage    Goals/Expected Outcomes  continued compliance       Initial Exercise Prescription: Initial Exercise Prescription - 10/05/17 1300      Date of Initial Exercise RX and Referring Provider   Date  10/05/17    Referring Provider  Dr. Elsworth Soho      Oxygen   Oxygen  Continuous    Liters  2      Bike   Level  0.4    Minutes  17      NuStep   Level  2    Minutes  17    METs  1.5      Track   Laps  10    Minutes  17      Prescription Details   Frequency (times per week)  2    Duration  Progress to 45 minutes of aerobic exercise without signs/symptoms of physical distress      Intensity   THRR 40-80% of Max Heartrate  58-117    Ratings of Perceived Exertion  11-13    Perceived Dyspnea  0-4      Progression   Progression  Continue progressive overload as per policy without signs/symptoms or physical distress.      Resistance Training   Training Prescription  Yes    Weight  orange bands    Reps  10-15        Perform Capillary Blood Glucose checks as needed.  Exercise Prescription Changes: Exercise Prescription Changes  Row Name 10/07/17 1500 10/12/17 1500 10/19/17 1500 10/26/17 1500 10/28/17 1554     Response to Exercise   Blood Pressure (Admit)  140/70  120/64  128/52  128/68  128/64   Blood Pressure (Exercise)  140/60  96/60  124/70  132/70  140/60   Blood Pressure (Exit)  110/60  130/60  130/60  122/54  118/64   Heart Rate (Admit)  100 bpm  107 bpm  100 bpm  107 bpm  104 bpm   Heart Rate (Exercise)  125 bpm  127 bpm  119 bpm  111 bpm  117 bpm   Heart Rate (Exit)  100 bpm  107 bpm  104 bpm  102 bpm  96 bpm   Oxygen Saturation (Admit)  96 %  92 %  94 %  97 %  96 %   Oxygen Saturation (Exercise)  91 %  92 %  94 %  92 %  91 %   Oxygen Saturation (Exit)  97 %  92 %  98 %  98 %  97 %   Rating of Perceived Exertion (Exercise)  _0 Perceived Dyspnea (Exercise)  _1 Duration  Progress to 45 minutes of aerobic exercise without signs/symptoms of physical distress  Progress to 45 minutes of aerobic exercise without signs/symptoms of physical distress  Progress to 45 minutes of aerobic exercise without signs/symptoms of physical distress  Progress to 45 minutes of aerobic exercise without signs/symptoms of physical distress  Progress to 45 minutes of aerobic exercise without signs/symptoms of physical distress   Intensity  Other (comment) HRR 40-80%  THRR unchanged  THRR unchanged  THRR unchanged  THRR unchanged     Progression   Progression  -  Continue to progress workloads to maintain intensity without signs/symptoms of physical distress.  Continue to progress workloads to maintain intensity without signs/symptoms of physical distress.  Continue to progress workloads to maintain intensity without signs/symptoms of physical distress.  Continue to progress workloads to maintain intensity without signs/symptoms of physical distress.     Resistance Training    Training Prescription  Yes  Yes  Yes  Yes  Yes   Weight  orange bands  orange bands  orange bands  orange bands  orange bands   Reps  10-15  10-15  10-15  10-15  10-15   Time  -  10 Minutes  10 Minutes  10 Minutes  10 Minutes     Oxygen   Oxygen  Continuous  Continuous  Continuous  Continuous  Continuous   Liters  _2 Bike   Level  0.4  0.4  0.4  0.4  -   Minutes  _3 -     NuStep   Level  -  _4 Minutes  -  _5 METs  -  1.6  1.9  2.2  2.2     Track   Laps  _6 Minutes  _7 Home Exercise Plan   Plans to continue exercise at  -  -  -  Home (comment)  Home (comment)  Frequency  -  -  -  Add 3 additional days to program exercise sessions.  Add 3 additional days to program exercise sessions.   Green Name 11/02/17 1530 11/04/17 1618 11/11/17 1500 11/16/17 1500 11/18/17 1600     Response to Exercise   Blood Pressure (Admit)  126/60  142/70  140/60  130/70  128/60   Blood Pressure (Exercise)  144/64  150/80  150/62  148/74  154/74   Blood Pressure (Exit)  124/70  116/60  134/62  120/72  110/64   Heart Rate (Admit)  109 bpm  103 bpm  107 bpm  99 bpm  93 bpm   Heart Rate (Exercise)  118 bpm  112 bpm  123 bpm  112 bpm  117 bpm   Heart Rate (Exit)  105 bpm  97 bpm  93 bpm  88 bpm  104 bpm   Oxygen Saturation (Admit)  95 %  95 %  97 %  99 %  98 %   Oxygen Saturation (Exercise)  90 %  91 %  93 %  93 %  93 %   Oxygen Saturation (Exit)  96 %  97 %  93 %  98 %  98 %   Rating of Perceived Exertion (Exercise)  _0 Perceived Dyspnea (Exercise)  _1 Duration  Progress to 45 minutes of aerobic exercise without signs/symptoms of physical distress  Progress to 45 minutes of aerobic exercise without signs/symptoms of physical distress  Progress to 45 minutes of aerobic exercise without signs/symptoms of physical distress  Progress to 45 minutes of aerobic exercise without signs/symptoms of  physical distress  Progress to 45 minutes of aerobic exercise without signs/symptoms of physical distress   Intensity  THRR unchanged  THRR unchanged  THRR unchanged  THRR unchanged  THRR unchanged     Progression   Progression  Continue to progress workloads to maintain intensity without signs/symptoms of physical distress.  Continue to progress workloads to maintain intensity without signs/symptoms of physical distress.  Continue to progress workloads to maintain intensity without signs/symptoms of physical distress.  Continue to progress workloads to maintain intensity without signs/symptoms of physical distress.  Continue to progress workloads to maintain intensity without signs/symptoms of physical distress.     Resistance Training   Training Prescription  Yes  Yes  Yes  Yes  Yes   Weight  orange bands  orange bands  orange bands  orange bands  orange bands   Reps  10-15  10-15  10-15  10-15  10-15   Time  10 Minutes  10 Minutes  10 Minutes  10 Minutes  10 Minutes     Oxygen   Oxygen  Continuous  Continuous  Continuous  Continuous  Continuous   Liters  _2 Bike   Level  0.4  -  - nutrition consult during this session  0.4  0.4   Minutes  17  -  -  17  17     NuStep   Level  _3 -   Minutes  _4 -   METs  2.2  2.4  2.2  2.4  -     Track   Laps  _5 9  12   Minutes  _0 Home Exercise Plan   Plans to continue exercise at  Home (comment)  -  -  -  -   Frequency  Add 3 additional days to program exercise sessions.  -  -  -  -   Row Name 11/25/17 1500 12/02/17 1600 12/09/17 1547 12/14/17 1500 12/16/17 1500     Response to Exercise   Blood Pressure (Admit)  144/60  140/64  150/60  138/60  168/80   Blood Pressure (Exercise)  130/80  182/80  168/68  170/78  190/84   Blood Pressure (Exit)  144/72  140/80  134/70  132/60  124/60   Heart Rate (Admit)  93 bpm  109 bpm  106 bpm  114 bpm  92 bpm   Heart Rate (Exercise)  116 bpm   133 bpm  124 bpm  126 bpm  126 bpm   Heart Rate (Exit)  99 bpm  103 bpm  103 bpm  102 bpm  101 bpm   Oxygen Saturation (Admit)  100 %  97 %  96 %  98 %  97 %   Oxygen Saturation (Exercise)  92 %  94 %  90 %  93 %  92 %   Oxygen Saturation (Exit)  99 %  97 %  97 %  98 %  97 %   Rating of Perceived Exertion (Exercise)  _1 Perceived Dyspnea (Exercise)  _2 Duration  Progress to 45 minutes of aerobic exercise without signs/symptoms of physical distress  Progress to 45 minutes of aerobic exercise without signs/symptoms of physical distress  Progress to 45 minutes of aerobic exercise without signs/symptoms of physical distress  Progress to 45 minutes of aerobic exercise without signs/symptoms of physical distress  Progress to 45 minutes of aerobic exercise without signs/symptoms of physical distress   Intensity  THRR unchanged  THRR unchanged  THRR unchanged  THRR unchanged  THRR unchanged     Progression   Progression  Continue to progress workloads to maintain intensity without signs/symptoms of physical distress.  Continue to progress workloads to maintain intensity without signs/symptoms of physical distress.  Continue to progress workloads to maintain intensity without signs/symptoms of physical distress.  Continue to progress workloads to maintain intensity without signs/symptoms of physical distress.  Continue to progress workloads to maintain intensity without signs/symptoms of physical distress.     Resistance Training   Training Prescription  Yes  Yes  Yes  Yes  Yes   Weight  orange bands  orange bands  orange bands  orange bands  orange bands   Reps  10-15  10-15  10-15  10-15  10-15   Time  10 Minutes  10 Minutes  10 Minutes  10 Minutes  10 Minutes     Oxygen   Oxygen  Continuous  Continuous  Continuous  Continuous  Continuous   Liters  _3 Bike   Level  0.6  0.6  0.6  0.6  -   Minutes  _4 -     NuStep   Level  5  -  _5 Minutes  17  -  17  17  17  METs  1.8  -  -  2.5  2.6     Track   Laps  10  11  -  10  10   Minutes  17  17  -  17  17      Exercise Comments: Exercise Comments    Row Name 10/28/17 0708           Exercise Comments  Home exercise completed          Exercise Goals and Review: Exercise Goals    Row Name 10/05/17 0854             Exercise Goals   Increase Physical Activity  Yes       Intervention  Provide advice, education, support and counseling about physical activity/exercise needs.;Develop an individualized exercise prescription for aerobic and resistive training based on initial evaluation findings, risk stratification, comorbidities and participant's personal goals.       Expected Outcomes  Achievement of increased cardiorespiratory fitness and enhanced flexibility, muscular endurance and strength shown through measurements of functional capacity and personal statement of participant.       Increase Strength and Stamina  Yes       Intervention  Provide advice, education, support and counseling about physical activity/exercise needs.;Develop an individualized exercise prescription for aerobic and resistive training based on initial evaluation findings, risk stratification, comorbidities and participant's personal goals.       Expected Outcomes  Achievement of increased cardiorespiratory fitness and enhanced flexibility, muscular endurance and strength shown through measurements of functional capacity and personal statement of participant.       Able to understand and use rate of perceived exertion (RPE) scale  Yes       Intervention  Provide education and explanation on how to use RPE scale       Expected Outcomes  Long Term:  Able to use RPE to guide intensity level when exercising independently       Able to understand and use Dyspnea scale  Yes       Intervention  Provide education and explanation on how to use Dyspnea scale       Expected Outcomes  Short Term: Able  to use Dyspnea scale daily in rehab to express subjective sense of shortness of breath during exertion;Long Term: Able to use Dyspnea scale to guide intensity level when exercising independently       Knowledge and understanding of Target Heart Rate Range (THRR)  Yes       Intervention  Provide education and explanation of THRR including how the numbers were predicted and where they are located for reference       Expected Outcomes  Short Term: Able to state/look up THRR;Short Term: Able to use daily as guideline for intensity in rehab;Long Term: Able to use THRR to govern intensity when exercising independently       Understanding of Exercise Prescription  Yes       Intervention  Provide education, explanation, and written materials on patient's individual exercise prescription       Expected Outcomes  Short Term: Able to explain program exercise prescription;Long Term: Able to explain home exercise prescription to exercise independently          Exercise Goals Re-Evaluation : Exercise Goals Re-Evaluation    Row Name 10/11/17 1647 11/01/17 1507 11/18/17 1018 12/17/17 1514       Exercise Goal Re-Evaluation   Exercise Goals Review  Increase Strength and Stamina;Increase Physical Activity;Able to understand and  use Dyspnea scale;Able to understand and use rate of perceived exertion (RPE) scale;Knowledge and understanding of Target Heart Rate Range (THRR);Understanding of Exercise Prescription  Increase Physical Activity;Able to understand and use rate of perceived exertion (RPE) scale;Knowledge and understanding of Target Heart Rate Range (THRR);Understanding of Exercise Prescription;Increase Strength and Stamina;Able to understand and use Dyspnea scale  Increase Physical Activity;Able to understand and use rate of perceived exertion (RPE) scale;Knowledge and understanding of Target Heart Rate Range (THRR);Understanding of Exercise Prescription;Increase Strength and Stamina;Able to understand and use  Dyspnea scale  Increase Strength and Stamina;Increase Physical Activity;Able to understand and use Dyspnea scale;Able to understand and use rate of perceived exertion (RPE) scale;Knowledge and understanding of Target Heart Rate Range (THRR);Understanding of Exercise Prescription    Comments  Patient has only attended one exercise session. Will cont. to monitor and progress as able.   Patient is progressing slowly. Doctor has placed an order for a POC so she can more easily be active with the oxygen. Patient has only attended 5 exercise sessions--will cont. to monitor and progress as able.   Patient is progressing slowly. Patient is able to walk up to 9 laps (261f) each in 15 minutes. Will cont. to monitor and progress as able.   Patient is progressing in program despite her level of deconditioning and disease progression. She has seen a benefit in the rehab and has been instructed and encouraged to exercise at home. Will cont. to monitor and motivate and encourage to cont. exercising in our maintenance program.     Expected Outcomes  Through exercise at rehab and at home, patient will increase strength and stamina and will find that ADL's are easier to preform.   Through exercise at rehab and at home, patient will increase strength and stamina and find that ADL's are easier to preform.   Through exercise at rehab and at home, patient will increase strength and stamina and find that ADL's are easier to preform.   Through exercise at rehab and at home, patient will increase endurance and strength. Patient will also be able to perform ADL's with less shortness of breath and fatigue.       Discharge Exercise Prescription (Final Exercise Prescription Changes): Exercise Prescription Changes - 12/16/17 1500      Response to Exercise   Blood Pressure (Admit)  168/80    Blood Pressure (Exercise)  190/84    Blood Pressure (Exit)  124/60    Heart Rate (Admit)  92 bpm    Heart Rate (Exercise)  126 bpm    Heart  Rate (Exit)  101 bpm    Oxygen Saturation (Admit)  97 %    Oxygen Saturation (Exercise)  92 %    Oxygen Saturation (Exit)  97 %    Rating of Perceived Exertion (Exercise)  13    Perceived Dyspnea (Exercise)  1    Duration  Progress to 45 minutes of aerobic exercise without signs/symptoms of physical distress    Intensity  THRR unchanged      Progression   Progression  Continue to progress workloads to maintain intensity without signs/symptoms of physical distress.      Resistance Training   Training Prescription  Yes    Weight  orange bands    Reps  10-15    Time  10 Minutes      Oxygen   Oxygen  Continuous    Liters  2      NuStep   Level  6    Minutes  17    METs  2.6      Track   Laps  10    Minutes  17       Nutrition:  Target Goals: Understanding of nutrition guidelines, daily intake of sodium <1571m, cholesterol <2029m calories 30% from fat and 7% or less from saturated fats, daily to have 5 or more servings of fruits and vegetables.  Biometrics: Pre Biometrics - 10/05/17 0930      Pre Biometrics   Grip Strength  24 kg        Nutrition Therapy Plan and Nutrition Goals: Nutrition Therapy & Goals - 10/29/17 1411      Nutrition Therapy   Diet  Heart Healthy      Personal Nutrition Goals   Nutrition Goal  Identify food quantities necessary to achieve wt loss of  -2# per week to a goal wt loss of 6-24 lb at graduation from pulmonary rehab.    Personal Goal #2  Describe the benefit of including fruits, vegetables, whole grains, and low-fat dairy products in a healthy meal plan.      Intervention Plan   Intervention  Prescribe, educate and counsel regarding individualized specific dietary modifications aiming towards targeted core components such as weight, hypertension, lipid management, diabetes, heart failure and other comorbidities.    Expected Outcomes  Short Term Goal: Understand basic principles of dietary content, such as calories, fat, sodium,  cholesterol and nutrients.;Long Term Goal: Adherence to prescribed nutrition plan.       Nutrition Assessments: Nutrition Assessments - 10/29/17 1407      Rate Your Plate Scores   Pre Score  42       Nutrition Goals Re-Evaluation:   Nutrition Goals Discharge (Final Nutrition Goals Re-Evaluation):   Psychosocial: Target Goals: Acknowledge presence or absence of significant depression and/or stress, maximize coping skills, provide positive support system. Participant is able to verbalize types and ability to use techniques and skills needed for reducing stress and depression.  Initial Review & Psychosocial Screening: Initial Psych Review & Screening - 10/05/17 0911      Initial Review   Current issues with  None Identified      Family Dynamics   Good Support System?  Yes      Barriers   Psychosocial barriers to participate in program  There are no identifiable barriers or psychosocial needs.      Screening Interventions   Interventions  Encouraged to exercise       Quality of Life Scores:  Scores of 19 and below usually indicate a poorer quality of life in these areas.  A difference of  2-3 points is a clinically meaningful difference.  A difference of 2-3 points in the total score of the Quality of Life Index has been associated with significant improvement in overall quality of life, self-image, physical symptoms, and general health in studies assessing change in quality of life.   PHQ-9: Recent Review Flowsheet Data    [facility] 10/05/2017 05/31/2017 03/12/2016 03/12/2016 07/27/2014   Decreased Interest 0 0 0 0 0   Down, Depressed, Hopeless 0 0 0 0 0   PHQ - 2 Score 0 0 0 0 0   Altered sleeping 0 - - - -   Tired, decreased energy 0 - - - -   Change in appetite 0 - - - -   Feeling bad or failure about yourself  0 - - - -   Trouble concentrating 0 - - - -  Moving slowly or fidgety/restless 0 - - - -   Suicidal thoughts 0 - - - -   PHQ-9 Score 0 - - -  -   Difficult doing work/chores Not difficult at all - - - -     Interpretation of Total Score  Total Score Depression Severity:  1-4 = Minimal depression, 5-9 = Mild depression, 10-14 = Moderate depression, 15-19 = Moderately severe depression, 20-27 = Severe depression   Psychosocial Evaluation and Intervention: Psychosocial Evaluation - 10/05/17 0912      Psychosocial Evaluation & Interventions   Interventions  Encouraged to exercise with the program and follow exercise prescription    Expected Outcomes  patient will remain free from psychosocial barriers to participation in pulmonary rehab    Continue Psychosocial Services   No Follow up required       Psychosocial Re-Evaluation: Psychosocial Re-Evaluation    [place] Name 10/12/17 0955 11/02/17 0956 11/19/17 0901 12/20/17 1020       Psychosocial Re-Evaluation   Current issues with  None Identified  None Identified  None Identified  None Identified    Interventions  Encouraged to attend Pulmonary Rehabilitation for the exercise  Encouraged to attend Pulmonary Rehabilitation for the exercise  Encouraged to attend Pulmonary Rehabilitation for the exercise  Encouraged to attend Pulmonary Rehabilitation for the exercise    Continue Psychosocial Services   No Follow up required  No Follow up required  No Follow up required  No Follow up required       Psychosocial Discharge (Final Psychosocial Re-Evaluation): Psychosocial Re-Evaluation - 12/20/17 1020      Psychosocial Re-Evaluation   Current issues with  None Identified    Interventions  Encouraged to attend Pulmonary Rehabilitation for the exercise    Continue Psychosocial Services   No Follow up required       Education: Education Goals: Education classes will be provided on a weekly basis, covering required topics. Participant will state understanding/return demonstration of topics presented.  Learning Barriers/Preferences: Learning Barriers/Preferences - 10/05/17 9826       Learning Barriers/Preferences   Learning Barriers  None    Learning Preferences  Computer/Internet;Group Instruction;Individual Instruction;Skilled Demonstration;Verbal Instruction;Video;Written Material       Education Topics: Risk Factor Reduction:  -Group instruction that is supported by a PowerPoint presentation. Instructor discusses the definition of a risk factor, different risk factors for pulmonary disease, and how the heart and lungs work together.     Nutrition for Pulmonary Patient:  -Group instruction provided by PowerPoint slides, verbal discussion, and written materials to support subject matter. The instructor gives an explanation and review of healthy diet recommendations, which includes a discussion on weight management, recommendations for fruit and vegetable consumption, as well as protein, fluid, caffeine, fiber, sodium, sugar, and alcohol. Tips for eating when patients are short of breath are discussed.   PULMONARY REHAB OTHER RESPIRATORY from 12/16/2017 in [place]  Date  12/02/17  Educator  RD  Instruction Review Code  2- meets goals/outcomes      Pursed Lip Breathing:  -Group instruction that is supported by demonstration and informational handouts. Instructor discusses the benefits of pursed lip and diaphragmatic breathing and detailed demonstration on how to preform both.     Oxygen Safety:  -Group instruction provided by PowerPoint, verbal discussion, and written material to support subject matter. There is an overview of "What is Oxygen" and "Why do we need it".  Instructor also reviews how to create a safe environment  for oxygen use, the importance of using oxygen as prescribed, and the risks of noncompliance. There is a brief discussion on traveling with oxygen and resources the patient may utilize.   Oxygen Equipment:  -Group instruction provided by [facility] Staff utilizing handouts, written materials, and equipment  demonstrations.   PULMONARY REHAB OTHER RESPIRATORY from 12/16/2017 in [place]  Date  10/28/17  Educator  Ace Gins  Instruction Review Code  2- meets goals/outcomes      Signs and Symptoms:  -Group instruction provided by written material and verbal discussion to support subject matter. Warning signs and symptoms of infection, stroke, and heart attack are reviewed and when to call the physician/911 reinforced. Tips for preventing the spread of infection discussed.   Advanced Directives:  -Group instruction provided by verbal instruction and written material to support subject matter. Instructor reviews Advanced Directive laws and proper instruction for filling out document.   Pulmonary Video:  -Group video education that reviews the importance of medication and oxygen compliance, exercise, good nutrition, pulmonary hygiene, and pursed lip and diaphragmatic breathing for the pulmonary patient.   Exercise for the Pulmonary Patient:  -Group instruction that is supported by a PowerPoint presentation. Instructor discusses benefits of exercise, core components of exercise, frequency, duration, and intensity of an exercise routine, importance of utilizing pulse oximetry during exercise, safety while exercising, and options of places to exercise outside of rehab.     Pulmonary Medications:  -Verbally interactive group education provided by instructor with focus on inhaled medications and proper administration.   PULMONARY REHAB OTHER RESPIRATORY from 12/16/2017 in [place]  Date  12/16/17  Educator  pharm D  Instruction Review Code  2- meets goals/outcomes      Anatomy and Physiology of the Respiratory System and Intimacy:  -Group instruction provided by PowerPoint, verbal discussion, and written material to support subject matter. Instructor reviews respiratory cycle and anatomical components of the respiratory system and  their functions. Instructor also reviews differences in obstructive and restrictive respiratory diseases with examples of each. Intimacy, Sex, and Sexuality differences are reviewed with a discussion on how relationships can change when diagnosed with pulmonary disease. Common sexual concerns are reviewed.   PULMONARY REHAB OTHER RESPIRATORY from 12/16/2017 in [place]  Date  11/18/17  Educator  rn  Instruction Review Code  2- meets goals/outcomes      MD DAY -A group question and answer session with a medical doctor that allows participants to ask questions that relate to their pulmonary disease state.   PULMONARY REHAB OTHER RESPIRATORY from 12/16/2017 in [place]  Date  12/09/17  Educator  Dr. Nelda Marseille  Instruction Review Code  2- meets goals/outcomes      OTHER EDUCATION -Group or individual verbal, written, or video instructions that support the educational goals of the pulmonary rehab program.   PULMONARY REHAB OTHER RESPIRATORY from 12/16/2017 in [place]  Date  10/07/17 [[facility] Eating]  Educator  Parke Simmers  Instruction Review Code  1- Verbalizes Understanding      Knowledge Questionnaire Score: Knowledge Questionnaire Score - 10/05/17 0909      Knowledge Questionnaire Score   Pre Score  11/13       Core Components/Risk Factors/Patient Goals at Admission: Personal Goals and Risk Factors at Admission - 10/05/17 0910      Core Components/Risk Factors/Patient Goals on Admission   Improve shortness of breath  with ADL's  Yes    Intervention  Provide education, individualized exercise plan and daily activity instruction to help decrease symptoms of SOB with activities of daily living.    Expected Outcomes  Short Term: Achieves a reduction of symptoms when performing activities of daily living.    Develop more efficient breathing techniques such as purse lipped breathing and  diaphragmatic breathing; and practicing self-pacing with activity  Yes    Intervention  Provide education, demonstration and support about specific breathing techniuqes utilized for more efficient breathing. Include techniques such as pursed lipped breathing, diaphragmatic breathing and self-pacing activity.    Expected Outcomes  Short Term: Participant will be able to demonstrate and use breathing techniques as needed throughout daily activities.    Increase knowledge of respiratory medications and ability to use respiratory devices properly   Yes    Intervention  Provide education and demonstration as needed of appropriate use of medications, inhalers, and oxygen therapy.    Expected Outcomes  Short Term: Achieves understanding of medications use. Understands that oxygen is a medication prescribed by physician. Demonstrates appropriate use of inhaler and oxygen therapy.       Core Components/Risk Factors/Patient Goals Review:  Goals and Risk Factor Review    Row Name 10/12/17 0954 11/02/17 0954 11/19/17 0901 12/20/17 1018       Core Components/Risk Factors/Patient Goals Review   Personal Goals Review  Improve shortness of breath with ADL's;Increase knowledge of respiratory medications and ability to use respiratory devices properly.;Develop more efficient breathing techniques such as purse lipped breathing and diaphragmatic breathing and practicing self-pacing with activity.  Improve shortness of breath with ADL's;Develop more efficient breathing techniques such as purse lipped breathing and diaphragmatic breathing and practicing self-pacing with activity.  Improve shortness of breath with ADL's;Develop more efficient breathing techniques such as purse lipped breathing and diaphragmatic breathing and practicing self-pacing with activity.  Improve shortness of breath with ADL's;Develop more efficient breathing techniques such as purse lipped breathing and diaphragmatic breathing and practicing  self-pacing with activity.    Review  Has only attended 1 exercise session, too early to see progression toward goals  3rd week of exercise, workloads have been increased, responding appropriately  continues to increase workloads as tolerated  continues to make slow progress, able to exercise at a higher level with less shortness of breath    Expected Outcomes  Should see progression of goals stated in the next 30 days  continued increase in workloads with appropriate response  see admission goals  see admission goals       Core Components/Risk Factors/Patient Goals at Discharge (Final Review):  Goals and Risk Factor Review - 12/20/17 1018      Core Components/Risk Factors/Patient Goals Review   Personal Goals Review  Improve shortness of breath with ADL's;Develop more efficient breathing techniques such as purse lipped breathing and diaphragmatic breathing and practicing self-pacing with activity.    Review  continues to make slow progress, able to exercise at a higher level with less shortness of breath    Expected Outcomes  see admission goals       ITP Comments:   Comments: ITP REVIEW Pt is making expected progress toward pulmonary rehab goals after completing 15 sessions. Recommend continued exercise, life style modification, education, and utilization of breathing techniques to increase stamina and strength and decrease shortness of breath with exertion.

## 2017-12-21 NOTE — Progress Notes (Signed)
Daily Session Note  Patient Details  Name: [name] [name] MRN: [id number] Date of Birth: June 10, 1941 Referring Provider:     PULMONARY REHAB OTHER RESP ORIENTATION from 10/05/2017 in [place]  Referring Provider  Dr. Elsworth Soho      Encounter Date: 12/21/2017  Check In: Session Check In - 12/21/17 1326      Check-In   Location  [facility]    Staff Present  Rosebud Poles, RN, Luisa Hart, RN, BSN;Kyndal Heringer, MS, ACSM RCEP, Exercise Physiologist;Lisa Ysidro Evert, RN    Supervising physician immediately available to respond to emergencies  [facility] immediately available    Physician(s)  Dr. Zigmund Daniel    Medication changes reported      No    Fall or balance concerns reported     No    Tobacco Cessation  No Change    Warm-up and Cool-down  Performed as group-led instruction    Resistance Training Performed  Yes    VAD Patient?  No      Pain Assessment   Currently in Pain?  No/denies    Multiple Pain Sites  No       Capillary Blood Glucose: No results found for this or any previous visit (from the past 24 hour(s)).  Exercise Prescription Changes - 12/21/17 1500      Response to Exercise   Blood Pressure (Admit)  160/76    Blood Pressure (Exercise)  144/62    Blood Pressure (Exit)  144/80    Heart Rate (Admit)  104 bpm    Heart Rate (Exercise)  123 bpm    Heart Rate (Exit)  101 bpm    Oxygen Saturation (Admit)  96 %    Oxygen Saturation (Exercise)  93 %    Oxygen Saturation (Exit)  99 %    Rating of Perceived Exertion (Exercise)  14    Perceived Dyspnea (Exercise)  2    Duration  Progress to 45 minutes of aerobic exercise without signs/symptoms of physical distress    Intensity  THRR unchanged      Progression   Progression  Continue to progress workloads to maintain intensity without signs/symptoms of physical distress.      Resistance Training   Training Prescription  Yes    Weight  orange bands    Reps   10-15    Time  10 Minutes      Oxygen   Oxygen  Continuous    Liters  2      Bike   Level  0.6    Minutes  17      NuStep   Level  6    Minutes  17    METs  2.4      Track   Laps  13    Minutes  17       Social History   Tobacco Use  Smoking Status Former Smoker  . Packs/day: 1.00  . Years: 40.00  . Pack years: 40.00  . Types: Cigarettes  . Last attempt to quit: 11/03/2012  . Years since quitting: 5.1  Smokeless Tobacco Never Used    Goals Met:  Exercise tolerated well No report of cardiac concerns or symptoms Strength training completed today  Goals Unmet:  Not Applicable  Comments: Service time is from 1:30p to 3:05p    Dr. Rush Farmer is Medical Director for Pulmonary Rehab at [facility].

## 2017-12-23 ENCOUNTER — Encounter (HOSPITAL_COMMUNITY): Payer: Medicare Other

## 2017-12-23 ENCOUNTER — Encounter (HOSPITAL_COMMUNITY)
Admission: RE | Admit: 2017-12-23 | Discharge: 2017-12-23 | Disposition: A | Discharge status: Home / Self Care | Payer: Medicare Other | Source: Ambulatory Visit | Attending: Pulmonary Disease | Admitting: Pulmonary Disease

## 2017-12-23 VITALS — Wt 136.7 lb

## 2017-12-23 DIAGNOSIS — E785 Hyperlipidemia, unspecified: Secondary | ICD-10-CM | POA: Diagnosis not present

## 2017-12-23 DIAGNOSIS — Z87891 Personal history of nicotine dependence: Secondary | ICD-10-CM | POA: Diagnosis not present

## 2017-12-23 DIAGNOSIS — I10 Essential (primary) hypertension: Secondary | ICD-10-CM | POA: Diagnosis not present

## 2017-12-23 DIAGNOSIS — Z79899 Other long term (current) drug therapy: Secondary | ICD-10-CM | POA: Diagnosis not present

## 2017-12-23 DIAGNOSIS — J8410 Pulmonary fibrosis, unspecified: Secondary | ICD-10-CM

## 2017-12-23 DIAGNOSIS — Z85118 Personal history of other malignant neoplasm of bronchus and lung: Secondary | ICD-10-CM | POA: Diagnosis not present

## 2017-12-23 NOTE — Progress Notes (Signed)
Daily Session Note  Patient Details  Name: [name] [name] MRN: [id number] Date of Birth: 03/20/1941 Referring Provider:     PULMONARY REHAB OTHER RESP ORIENTATION from 10/05/2017 in [place]  Referring Provider  Dr. Elsworth Soho      Encounter Date: 12/23/2017  Check In: Session Check In - 12/23/17 1330      Check-In   Location  [facility]    Staff Present  Rosebud Poles, RN, BSN;Molly diVincenzo, MS, ACSM RCEP, Exercise Physiologist;Keidra Withers Rollene Rotunda, RN, BSN    Supervising physician immediately available to respond to emergencies  [facility] immediately available    Physician(s)  Dr. Bonner Puna    Medication changes reported      No    Fall or balance concerns reported     No    Tobacco Cessation  No Change    Warm-up and Cool-down  Performed as group-led instruction    Resistance Training Performed  Yes    VAD Patient?  No      Pain Assessment   Currently in Pain?  No/denies    Multiple Pain Sites  No       Capillary Blood Glucose: No results found for this or any previous visit (from the past 24 hour(s)).  Exercise Prescription Changes - 12/23/17 1610      Response to Exercise   Blood Pressure (Admit)  130/60    Blood Pressure (Exercise)  166/72    Blood Pressure (Exit)  122/40    Heart Rate (Admit)  94 bpm    Heart Rate (Exercise)  117 bpm    Heart Rate (Exit)  99 bpm    Oxygen Saturation (Admit)  97 %    Oxygen Saturation (Exercise)  91 %    Oxygen Saturation (Exit)  97 %    Rating of Perceived Exertion (Exercise)  15    Perceived Dyspnea (Exercise)  3.5    Duration  Progress to 45 minutes of aerobic exercise without signs/symptoms of physical distress    Intensity  THRR unchanged      Progression   Progression  Continue to progress workloads to maintain intensity without signs/symptoms of physical distress.      Resistance Training   Training Prescription  Yes    Weight  orange bands    Reps  10-15    Time  10  Minutes      Oxygen   Oxygen  Continuous    Liters  2      Bike   Level  0.6    Minutes  17      Track   Laps  9    Minutes  17       Social History   Tobacco Use  Smoking Status Former Smoker  . Packs/day: 1.00  . Years: 40.00  . Pack years: 40.00  . Types: Cigarettes  . Last attempt to quit: 11/03/2012  . Years since quitting: 5.1  Smokeless Tobacco Never Used    Goals Met:  Independence with exercise equipment Improved SOB with ADL's Using PLB without cueing & demonstrates good technique Exercise tolerated well Queuing for purse lip breathing No report of cardiac concerns or symptoms Strength training completed today  Goals Unmet:  Not Applicable  Comments: Service time is from 1330 to 1530   Dr. Rush Farmer is Medical Director for Pulmonary Rehab at [facility].

## 2017-12-28 ENCOUNTER — Encounter (HOSPITAL_COMMUNITY)
Admission: RE | Admit: 2017-12-28 | Discharge: 2017-12-28 | Disposition: A | Discharge status: Home / Self Care | Payer: Medicare Other | Source: Ambulatory Visit | Attending: Pulmonary Disease | Admitting: Pulmonary Disease

## 2017-12-28 ENCOUNTER — Encounter (HOSPITAL_COMMUNITY): Payer: Medicare Other

## 2017-12-28 VITALS — Wt 136.0 lb

## 2017-12-28 DIAGNOSIS — I10 Essential (primary) hypertension: Secondary | ICD-10-CM | POA: Diagnosis not present

## 2017-12-28 DIAGNOSIS — J8410 Pulmonary fibrosis, unspecified: Secondary | ICD-10-CM

## 2017-12-28 DIAGNOSIS — Z79899 Other long term (current) drug therapy: Secondary | ICD-10-CM | POA: Diagnosis not present

## 2017-12-28 DIAGNOSIS — Z87891 Personal history of nicotine dependence: Secondary | ICD-10-CM | POA: Diagnosis not present

## 2017-12-28 DIAGNOSIS — E785 Hyperlipidemia, unspecified: Secondary | ICD-10-CM | POA: Diagnosis not present

## 2017-12-28 DIAGNOSIS — Z85118 Personal history of other malignant neoplasm of bronchus and lung: Secondary | ICD-10-CM | POA: Diagnosis not present

## 2017-12-28 NOTE — Progress Notes (Signed)
Daily Session Note  Patient Details  Name: [name] [name] MRN: [id number] Date of Birth: Jul 22, 1941 Referring Provider:     PULMONARY REHAB OTHER RESP ORIENTATION from 10/05/2017 in [place]  Referring Provider  Dr. Elsworth Soho      Encounter Date: 12/28/2017  Check In: Session Check In - 12/28/17 1540      Check-In   Staff Present  Rosebud Poles, RN, BSN;Molly diVincenzo, MS, ACSM RCEP, Exercise Physiologist;Be[place] Austad Rollene Rotunda, RN, BSN    Supervising physician immediately available to respond to emergencies  [facility] immediately available    Physician(s)  Dr. Bonner Puna    Medication changes reported      No    Fall or balance concerns reported     No    Tobacco Cessation  No Change    Warm-up and Cool-down  Performed as group-led instruction    Resistance Training Performed  Yes    VAD Patient?  No      Pain Assessment   Currently in Pain?  No/denies    Multiple Pain Sites  No       Capillary Blood Glucose: No results found for this or any previous visit (from the past 24 hour(s)).  Exercise Prescription Changes - 12/28/17 1603      Response to Exercise   Blood Pressure (Admit)  140/66    Blood Pressure (Exercise)  122/70    Blood Pressure (Exit)  136/64    Heart Rate (Admit)  106 bpm    Heart Rate (Exercise)  127 bpm    Heart Rate (Exit)  98 bpm    Oxygen Saturation (Admit)  98 %    Oxygen Saturation (Exercise)  93 %    Oxygen Saturation (Exit)  97 %    Rating of Perceived Exertion (Exercise)  17    Perceived Dyspnea (Exercise)  3    Duration  Progress to 45 minutes of aerobic exercise without signs/symptoms of physical distress    Intensity  THRR unchanged      Progression   Progression  Continue to progress workloads to maintain intensity without signs/symptoms of physical distress.      Resistance Training   Training Prescription  Yes    Weight  orange bands    Reps  10-15    Time  10 Minutes      Oxygen   Oxygen  Continuous     Liters  2      Bike   Level  0.6    Minutes  17      NuStep   Level  6    Minutes  17    METs  2.6      Track   Laps  9    Minutes  17       Social History   Tobacco Use  Smoking Status Former Smoker  . Packs/day: 1.00  . Years: 40.00  . Pack years: 40.00  . Types: Cigarettes  . Last attempt to quit: 11/03/2012  . Years since quitting: 5.1  Smokeless Tobacco Never Used    Goals Met:  Independence with exercise equipment Improved SOB with ADL's Using PLB without cueing & demonstrates good technique Exercise tolerated well No report of cardiac concerns or symptoms Strength training completed today  Goals Unmet:  Not Applicable  Comments: Service time is from 1330 to 1510   Dr. Rush Farmer is Medical Director for Pulmonary Rehab at [facility].

## 2017-12-30 ENCOUNTER — Encounter (HOSPITAL_COMMUNITY): Payer: Medicare Other

## 2017-12-30 ENCOUNTER — Encounter (HOSPITAL_COMMUNITY)
Admission: RE | Admit: 2017-12-30 | Discharge: 2017-12-30 | Disposition: A | Discharge status: Home / Self Care | Payer: Medicare Other | Source: Ambulatory Visit | Attending: Pulmonary Disease | Admitting: Pulmonary Disease

## 2017-12-30 DIAGNOSIS — Z87891 Personal history of nicotine dependence: Secondary | ICD-10-CM | POA: Diagnosis not present

## 2017-12-30 DIAGNOSIS — J8410 Pulmonary fibrosis, unspecified: Secondary | ICD-10-CM | POA: Diagnosis not present

## 2017-12-30 DIAGNOSIS — Z79899 Other long term (current) drug therapy: Secondary | ICD-10-CM | POA: Diagnosis not present

## 2017-12-30 DIAGNOSIS — I10 Essential (primary) hypertension: Secondary | ICD-10-CM | POA: Diagnosis not present

## 2017-12-30 DIAGNOSIS — Z85118 Personal history of other malignant neoplasm of bronchus and lung: Secondary | ICD-10-CM | POA: Diagnosis not present

## 2017-12-30 DIAGNOSIS — E785 Hyperlipidemia, unspecified: Secondary | ICD-10-CM | POA: Diagnosis not present

## 2017-12-30 NOTE — Progress Notes (Signed)
Daily Session Note  Patient Details  Name: [name] [name] MRN: [id number] Date of Birth: January 28, 1941 Referring Provider:     PULMONARY REHAB OTHER RESP ORIENTATION from 10/05/2017 in [place]  Referring Provider  Dr. Elsworth Soho      Encounter Date: 12/30/2017  Check In: Session Check In - 12/30/17 1312      Check-In   Location  [facility]    Staff Present  Rosebud Poles, RN, BSN;Molly diVincenzo, MS, ACSM RCEP, Exercise Physiologist;Wolfe Camarena Rollene Rotunda, RN, Roque Cash, RN    Supervising physician immediately available to respond to emergencies  [facility] immediately available    Physician(s)  Dr. Tyrell Antonio    Medication changes reported      No    Fall or balance concerns reported     No    Tobacco Cessation  No Change    Warm-up and Cool-down  Performed as group-led instruction    Resistance Training Performed  Yes    VAD Patient?  No      Pain Assessment   Currently in Pain?  No/denies    Multiple Pain Sites  No       Capillary Blood Glucose: No results found for this or any previous visit (from the past 24 hour(s)).    Social History   Tobacco Use  Smoking Status Former Smoker  . Packs/day: 1.00  . Years: 40.00  . Pack years: 40.00  . Types: Cigarettes  . Last attempt to quit: 11/03/2012  . Years since quitting: 5.1  Smokeless Tobacco Never Used    Goals Met:  Independence with exercise equipment Improved SOB with ADL's Using PLB without cueing & demonstrates good technique Exercise tolerated well No report of cardiac concerns or symptoms Strength training completed today  Goals Unmet:  Not Applicable  Comments: Service time is from 1230 to 1445 Patient attended an education session on chronic illness and mental health   Dr. Rush Farmer is Medical Director for Pulmonary Rehab at [facility].

## 2018-01-03 ENCOUNTER — Telehealth: Payer: Self-pay | Admitting: Pulmonary Disease

## 2018-01-03 NOTE — Telephone Encounter (Signed)
Attempted to call [employer] to check status of patient assistance application. Phone #: [phone].  Was on hold over 15 minutes with no answer.  Will call back.

## 2018-01-04 ENCOUNTER — Encounter (HOSPITAL_COMMUNITY): Payer: Medicare Other

## 2018-01-06 ENCOUNTER — Encounter (HOSPITAL_COMMUNITY)
Admission: RE | Admit: 2018-01-06 | Discharge: 2018-01-06 | Disposition: A | Discharge status: Home / Self Care | Payer: Medicare Other | Source: Ambulatory Visit | Attending: Pulmonary Disease | Admitting: Pulmonary Disease

## 2018-01-06 ENCOUNTER — Encounter (HOSPITAL_COMMUNITY): Payer: Medicare Other

## 2018-01-06 DIAGNOSIS — J8410 Pulmonary fibrosis, unspecified: Secondary | ICD-10-CM | POA: Diagnosis not present

## 2018-01-06 DIAGNOSIS — E785 Hyperlipidemia, unspecified: Secondary | ICD-10-CM | POA: Insufficient documentation

## 2018-01-06 DIAGNOSIS — Z87891 Personal history of nicotine dependence: Secondary | ICD-10-CM | POA: Insufficient documentation

## 2018-01-06 DIAGNOSIS — Z79899 Other long term (current) drug therapy: Secondary | ICD-10-CM | POA: Diagnosis not present

## 2018-01-06 DIAGNOSIS — M545 Low back pain: Secondary | ICD-10-CM | POA: Diagnosis not present

## 2018-01-06 DIAGNOSIS — Z85118 Personal history of other malignant neoplasm of bronchus and lung: Secondary | ICD-10-CM | POA: Insufficient documentation

## 2018-01-06 DIAGNOSIS — M1990 Unspecified osteoarthritis, unspecified site: Secondary | ICD-10-CM | POA: Insufficient documentation

## 2018-01-06 DIAGNOSIS — G8929 Other chronic pain: Secondary | ICD-10-CM | POA: Insufficient documentation

## 2018-01-06 DIAGNOSIS — Z96641 Presence of right artificial hip joint: Secondary | ICD-10-CM | POA: Diagnosis not present

## 2018-01-06 DIAGNOSIS — I10 Essential (primary) hypertension: Secondary | ICD-10-CM | POA: Insufficient documentation

## 2018-01-06 NOTE — Progress Notes (Signed)
Daily Session Note  Patient Details  Name: [name] [name] MRN: [id number] Date of Birth: 1941-06-24 Referring Provider:     PULMONARY REHAB OTHER RESP ORIENTATION from 10/05/2017 in [place]  Referring Provider  Dr. Elsworth Soho      Encounter Date: 01/06/2018  Check In: Session Check In - 01/06/18 1335      Check-In   Location  [facility]    Staff Present  Rosebud Poles, RN, BSN;Molly diVincenzo, MS, ACSM RCEP, Exercise Physiologist;Vern Prestia Rollene Rotunda, RN, Roque Cash, RN    Supervising physician immediately available to respond to emergencies  [facility] immediately available    Physician(s)  Dr. Denton Brick    Medication changes reported      No    Fall or balance concerns reported     No    Tobacco Cessation  No Change    Warm-up and Cool-down  Performed as group-led instruction    Resistance Training Performed  Yes    VAD Patient?  No      Pain Assessment   Currently in Pain?  No/denies    Multiple Pain Sites  No       Capillary Blood Glucose: No results found for this or any previous visit (from the past 24 hour(s)).    Social History   Tobacco Use  Smoking Status Former Smoker  . Packs/day: 1.00  . Years: 40.00  . Pack years: 40.00  . Types: Cigarettes  . Last attempt to quit: 11/03/2012  . Years since quitting: 5.1  Smokeless Tobacco Never Used    Goals Met:  Independence with exercise equipment Improved SOB with ADL's Using PLB without cueing & demonstrates good technique Exercise tolerated well No report of cardiac concerns or symptoms Strength training completed today  Goals Unmet:  Not Applicable  Comments: Service time is from 1330 to 1535   Dr. Rush Farmer is Medical Director for Pulmonary Rehab at [facility].

## 2018-01-10 NOTE — Progress Notes (Signed)
Pulmonary Individual Treatment Plan  Patient Details  Name: [name] [name] MRN: [id number] Date of Birth: Jan 09, 1941 Referring Provider:     PULMONARY REHAB OTHER RESP ORIENTATION from 10/05/2017 in [place]  Referring Provider  Dr. Elsworth Soho      Initial Encounter Date:    [place] from 10/05/2017 in [place]  Date  10/05/17  Referring Provider  Dr. Elsworth Soho      Visit Diagnosis: Pulmonary fibrosis ([place])  Patient's Home Medications on Admission:   Current Outpatient Medications:  .  acetaminophen (TYLENOL) 500 MG tablet, Take 1,000 mg by mouth every 6 (six) hours as needed for pain., Disp: , Rfl:  .  amLODipine (NORVASC) 10 MG tablet, Take 1 tablet (10 mg total) by mouth daily., Disp: 30 tablet, Rfl: 0 .  atorvastatin (LIPITOR) 40 MG tablet, Take 20 mg by mouth every morning., Disp: , Rfl:  .  hydrochlorothiazide (HYDRODIURIL) 25 MG tablet, Take 25 mg by mouth daily before breakfast. , Disp: , Rfl:  .  Hypromellose (ARTIFICIAL TEARS OP), Place 1 drop into both eyes daily., Disp: , Rfl:  .  loratadine (CLARITIN) 10 MG tablet, Take 10 mg by mouth daily., Disp: , Rfl:  .  predniSONE (DELTASONE) 5 MG tablet, Alternate 47m and 526mevery other day for 2 weeks, then decrease to 59m37maily and hold at this dose., Disp: 50 tablet, Rfl: 0  Past Medical History: Past Medical History:  Diagnosis Date  . Anxiety   . Arthritis    a. 10/2012 s/p Right THA.  . AMarland Kitchenthritis    "hands" (09/21/2017)  . Chronic lower back pain   . Complication of anesthesia    "couldn't wake me up after my hip surgery" (09/21/2017)  . Depression   . History of ARDS   . Hyperlipidemia   . Hypertension   . Lung [place] ([facility]/20[place]   LUL lung SQUAMOUS CELL  . Lung [place] ([facility] 12[place]    LLL ADENOCARCINOMA  . Pulmonary fibrosis ([facility] ~ 07/2017  . Pulmonary nodule    a. 10/2012 CT: 50m41mterior LUL nodule w/  spiculated appearance - rec PET CT.  . Seasonal allergies    "I take Claritin" (09/21/2017)  . Sinus headache     Tobacco Use: Social History   Tobacco Use  Smoking Status Former Smoker  . Packs/day: 1.00  . Years: 40.00  . Pack years: 40.00  . Types: Cigarettes  . Last attempt to quit: 11/03/2012  . Years since quitting: 5.1  Smokeless Tobacco Never Used    Labs: Recent Review Flowsheet Data    Labs for ITP Cardiac and Pulmonary Rehab Latest Ref Rng & Units 11/11/2012 11/12/2012 11/12/2012 11/12/2012 11/12/2012   Trlycerides <150 mg/dL - - - - -   PHART 7.350 - 7.450 7.374 7.392 7.405 7.512(H) 7.473(H)   PCO2ART 35.0 - 45.0 mmHg 31.1(L) 51.1(H) 53.7(H) 47.8(H) 53.4(H)   HCO3 20.0 - 24.0 mEq/L 17.7(L) 30.8(H) 32.9(H) 38.1(H) 38.6(H)   TCO2 0 - 100 mmol/L 17.3 28.3 30.6 34.6 35.3   ACIDBASEDEF 0.0 - 2.0 mmol/L 6.4(H) - - - -   O2SAT % 94.2 93.3 94.4 98.8 99.5      Capillary Blood Glucose: Lab Results  Component Value Date   GLUCAP 98 09/24/2017   GLUCAP 100 (H) 09/24/2017   GLUCAP 142 (H) 09/23/2017   GLUCAP 131 (H) 09/23/2017   GLUCAP 125 (H) 09/23/2017     Pulmonary Assessment  Scores:   Pulmonary Function Assessment: Pulmonary Function Assessment - 10/05/17 0909      Breath   Bilateral Breath Sounds  Expiratory;Wheezes fine crackles in bases       Exercise Target Goals:    Exercise Program Goal: Individual exercise prescription set using results from initial 6 min walk test and THRR while considering  patient's activity barriers and safety.    Exercise Prescription Goal: Initial exercise prescription builds to 30-45 minutes a day of aerobic activity, 2-3 days per week.  Home exercise guidelines will be given to patient during program as part of exercise prescription that the participant will acknowledge.  Activity Barriers & Risk Stratification: Activity Barriers & Cardiac Risk Stratification - 10/05/17 0851      Activity Barriers & Cardiac Risk  Stratification   Activity Barriers  Shortness of Breath;Deconditioning;Back Problems       6 Minute Walk:   Oxygen Initial Assessment: Oxygen Initial Assessment - 10/05/17 1319      Initial 6 min Walk   Oxygen Used  Continuous;E-Tanks    Liters per minute  2      Program Oxygen Prescription   Program Oxygen Prescription  E-Tanks;Continuous    Liters per minute  2       Oxygen Re-Evaluation: Oxygen Re-Evaluation    Row Name 10/12/17 0951 11/02/17 0951 11/19/17 0900 12/20/17 1304 01/07/18 1138     Program Oxygen Prescription   Program Oxygen Prescription  E-Tanks;Continuous  Continuous;E-Tanks  Continuous;E-Tanks  Continuous;E-Tanks  Continuous;E-Tanks   Liters per minute  _0 Home Oxygen   Home Oxygen Device  Portable Concentrator;E-Tanks  E-Tanks;Portable Concentrator  E-Tanks;Portable Concentrator  E-Tanks;Portable Concentrator  E-Tanks;Portable Concentrator   Sleep Oxygen Prescription  Continuous  Continuous  Continuous  Continuous  Continuous   Liters per minute  _1 Home Exercise Oxygen Prescription  Continuous  Continuous  Continuous  Continuous  Continuous   Liters per minute  _2 Home at Rest Exercise Oxygen Prescription  None  None  None  None  None   Compliance with Home Oxygen Use  Yes  Yes  Yes  Yes  Yes     Goals/Expected Outcomes   Short Term Goals  To learn and exhibit compliance with exercise, home and travel O2 prescription;To learn and understand importance of monitoring SPO2 with pulse oximeter and demonstrate accurate use of the pulse oximeter.;To learn and understand importance of maintaining oxygen saturations>88%;To learn and demonstrate proper pursed lip breathing techniques or other breathing techniques.;To learn and demonstrate proper use of respiratory medications  To learn and exhibit compliance with exercise, home and travel O2 prescription  To learn and exhibit compliance with exercise, home and travel O2  prescription  To learn and exhibit compliance with exercise, home and travel O2 prescription;To learn and understand importance of monitoring SPO2 with pulse oximeter and demonstrate accurate use of the pulse oximeter.;To learn and understand importance of maintaining oxygen saturations>88%;To learn and demonstrate proper pursed lip breathing techniques or other breathing techniques.;To learn and demonstrate proper use of respiratory medications  To learn and exhibit compliance with exercise, home and travel O2 prescription;To learn and understand importance of monitoring SPO2 with pulse oximeter and demonstrate accurate use of the pulse oximeter.;To learn and understand importance of maintaining oxygen saturations>88%;To learn and demonstrate proper pursed lip breathing techniques or other  breathing techniques.;To learn and demonstrate proper use of respiratory medications   Long  Term Goals  Exhibits compliance with exercise, home and travel O2 prescription;Verbalizes importance of monitoring SPO2 with pulse oximeter and return demonstration;Maintenance of O2 saturations>88%;Exhibits proper breathing techniques, such as pursed lip breathing or other method taught during program session;Compliance with respiratory medication  Exhibits compliance with exercise, home and travel O2 prescription  Exhibits compliance with exercise, home and travel O2 prescription  Exhibits compliance with exercise, home and travel O2 prescription;Demonstrates proper use of MDI's;Compliance with respiratory medication;Maintenance of O2 saturations>88%;Exhibits proper breathing techniques, such as pursed lip breathing or other method taught during program session;Verbalizes importance of monitoring SPO2 with pulse oximeter and return demonstration  Exhibits compliance with exercise, home and travel O2 prescription;Demonstrates proper use of MDI's;Compliance with respiratory medication;Maintenance of O2 saturations>88%;Exhibits  proper breathing techniques, such as pursed lip breathing or other method taught during program session;Verbalizes importance of monitoring SPO2 with pulse oximeter and return demonstration   Comments  Just began program, for now oxygen useage is as stated  compliant with oxygen useage  compliant with oxygen useage  compliant with oxygen useage  compliant with oxygen useage   Goals/Expected Outcomes  compliance with oxygen useage  continued compliance  continued compliance  continued compliance  -      Oxygen Discharge (Final Oxygen Re-Evaluation): Oxygen Re-Evaluation - 01/07/18 1138      Program Oxygen Prescription   Program Oxygen Prescription  Continuous;E-Tanks    Liters per minute  2      Home Oxygen   Home Oxygen Device  E-Tanks;Portable Concentrator    Sleep Oxygen Prescription  Continuous    Liters per minute  2    Home Exercise Oxygen Prescription  Continuous    Liters per minute  2    Home at Rest Exercise Oxygen Prescription  None    Compliance with Home Oxygen Use  Yes      Goals/Expected Outcomes   Short Term Goals  To learn and exhibit compliance with exercise, home and travel O2 prescription;To learn and understand importance of monitoring SPO2 with pulse oximeter and demonstrate accurate use of the pulse oximeter.;To learn and understand importance of maintaining oxygen saturations>88%;To learn and demonstrate proper pursed lip breathing techniques or other breathing techniques.;To learn and demonstrate proper use of respiratory medications    Long  Term Goals  Exhibits compliance with exercise, home and travel O2 prescription;Demonstrates proper use of MDI's;Compliance with respiratory medication;Maintenance of O2 saturations>88%;Exhibits proper breathing techniques, such as pursed lip breathing or other method taught during program session;Verbalizes importance of monitoring SPO2 with pulse oximeter and return demonstration    Comments  compliant with oxygen useage        Initial Exercise Prescription: Initial Exercise Prescription - 10/05/17 1300      Date of Initial Exercise RX and Referring Provider   Date  10/05/17    Referring Provider  Dr. Elsworth Soho      Oxygen   Oxygen  Continuous    Liters  2      Bike   Level  0.4    Minutes  17      NuStep   Level  2    Minutes  17    METs  1.5      Track   Laps  10    Minutes  17      Prescription Details   Frequency (times per week)  2    Duration  Progress to 45  minutes of aerobic exercise without signs/symptoms of physical distress      Intensity   THRR 40-80% of Max Heartrate  58-117    Ratings of Perceived Exertion  11-13    Perceived Dyspnea  0-4      Progression   Progression  Continue progressive overload as per policy without signs/symptoms or physical distress.      Resistance Training   Training Prescription  Yes    Weight  orange bands    Reps  10-15       Perform Capillary Blood Glucose checks as needed.  Exercise Prescription Changes: Exercise Prescription Changes    Row Name 10/07/17 1500 10/12/17 1500 10/19/17 1500 10/26/17 1500 10/28/17 1554     Response to Exercise   Blood Pressure (Admit)  140/70  120/64  128/52  128/68  128/64   Blood Pressure (Exercise)  140/60  96/60  124/70  132/70  140/60   Blood Pressure (Exit)  110/60  130/60  130/60  122/54  118/64   Heart Rate (Admit)  100 bpm  107 bpm  100 bpm  107 bpm  104 bpm   Heart Rate (Exercise)  125 bpm  127 bpm  119 bpm  111 bpm  117 bpm   Heart Rate (Exit)  100 bpm  107 bpm  104 bpm  102 bpm  96 bpm   Oxygen Saturation (Admit)  96 %  92 %  94 %  97 %  96 %   Oxygen Saturation (Exercise)  91 %  92 %  94 %  92 %  91 %   Oxygen Saturation (Exit)  97 %  92 %  98 %  98 %  97 %   Rating of Perceived Exertion (Exercise)  _0 Perceived Dyspnea (Exercise)  _1 Duration  Progress to 45 minutes of aerobic exercise without signs/symptoms of physical distress  Progress to 45 minutes of  aerobic exercise without signs/symptoms of physical distress  Progress to 45 minutes of aerobic exercise without signs/symptoms of physical distress  Progress to 45 minutes of aerobic exercise without signs/symptoms of physical distress  Progress to 45 minutes of aerobic exercise without signs/symptoms of physical distress   Intensity  Other (comment) HRR 40-80%  THRR unchanged  THRR unchanged  THRR unchanged  THRR unchanged     Progression   Progression  -  Continue to progress workloads to maintain intensity without signs/symptoms of physical distress.  Continue to progress workloads to maintain intensity without signs/symptoms of physical distress.  Continue to progress workloads to maintain intensity without signs/symptoms of physical distress.  Continue to progress workloads to maintain intensity without signs/symptoms of physical distress.     Resistance Training   Training Prescription  Yes  Yes  Yes  Yes  Yes   Weight  orange bands  orange bands  orange bands  orange bands  orange bands   Reps  10-15  10-15  10-15  10-15  10-15   Time  -  10 Minutes  10 Minutes  10 Minutes  10 Minutes     Oxygen   Oxygen  Continuous  Continuous  Continuous  Continuous  Continuous   Liters  _2 Bike   Level  0.4  0.4  0.4  0.4  -   Minutes  17  _0 -     NuStep   Level  -  _1 Minutes  -  _2 METs  -  1.6  1.9  2.2  2.2     Track   Laps  _3 Minutes  _4 Home Exercise Plan   Plans to continue exercise at  -  -  -  Home (comment)  Home (comment)   Frequency  -  -  -  Add 3 additional days to program exercise sessions.  Add 3 additional days to program exercise sessions.   [place] Name 11/02/17 1530 11/04/17 1618 11/11/17 1500 11/16/17 1500 11/18/17 1600     Response to Exercise   Blood Pressure (Admit)  126/60  142/70  140/60  130/70  128/60   Blood Pressure (Exercise)  144/64  150/80  150/62  148/74  154/74   Blood  Pressure (Exit)  124/70  116/60  134/62  120/72  110/64   Heart Rate (Admit)  109 bpm  103 bpm  107 bpm  99 bpm  93 bpm   Heart Rate (Exercise)  118 bpm  112 bpm  123 bpm  112 bpm  117 bpm   Heart Rate (Exit)  105 bpm  97 bpm  93 bpm  88 bpm  104 bpm   Oxygen Saturation (Admit)  95 %  95 %  97 %  99 %  98 %   Oxygen Saturation (Exercise)  90 %  91 %  93 %  93 %  93 %   Oxygen Saturation (Exit)  96 %  97 %  93 %  98 %  98 %   Rating of Perceived Exertion (Exercise)  _5 Perceived Dyspnea (Exercise)  _6 Duration  Progress to 45 minutes of aerobic exercise without signs/symptoms of physical distress  Progress to 45 minutes of aerobic exercise without signs/symptoms of physical distress  Progress to 45 minutes of aerobic exercise without signs/symptoms of physical distress  Progress to 45 minutes of aerobic exercise without signs/symptoms of physical distress  Progress to 45 minutes of aerobic exercise without signs/symptoms of physical distress   Intensity  THRR unchanged  THRR unchanged  THRR unchanged  THRR unchanged  THRR unchanged     Progression   Progression  Continue to progress workloads to maintain intensity without signs/symptoms of physical distress.  Continue to progress workloads to maintain intensity without signs/symptoms of physical distress.  Continue to progress workloads to maintain intensity without signs/symptoms of physical distress.  Continue to progress workloads to maintain intensity without signs/symptoms of physical distress.  Continue to progress workloads to maintain intensity without signs/symptoms of physical distress.     Resistance Training   Training Prescription  Yes  Yes  Yes  Yes  Yes   Weight  orange bands  orange bands  orange bands  orange bands  orange bands   Reps  10-15  10-15  10-15  10-15  10-15   Time  10 Minutes  10 Minutes  10 Minutes  10 Minutes  10 Minutes     Oxygen   Oxygen  Continuous  Continuous  Continuous   Continuous  Continuous   Liters  _0 Bike   Level  0.4  -  - nutrition consult during this session  0.4  0.4   Minutes  17  -  -  17  17     NuStep   Level  _1 -   Minutes  _2 -   METs  2.2  2.4  2.2  2.4  -     Track   Laps  _3 Minutes  _4 Home Exercise Plan   Plans to continue exercise at  Home (comment)  -  -  -  -   Frequency  Add 3 additional days to program exercise sessions.  -  -  -  -   Row Name 11/25/17 1500 12/02/17 1600 12/09/17 1547 12/14/17 1500 12/16/17 1500     Response to Exercise   Blood Pressure (Admit)  144/60  140/64  150/60  138/60  168/80   Blood Pressure (Exercise)  130/80  182/80  168/68  170/78  190/84   Blood Pressure (Exit)  144/72  140/80  134/70  132/60  124/60   Heart Rate (Admit)  93 bpm  109 bpm  106 bpm  114 bpm  92 bpm   Heart Rate (Exercise)  116 bpm  133 bpm  124 bpm  126 bpm  126 bpm   Heart Rate (Exit)  99 bpm  103 bpm  103 bpm  102 bpm  101 bpm   Oxygen Saturation (Admit)  100 %  97 %  96 %  98 %  97 %   Oxygen Saturation (Exercise)  92 %  94 %  90 %  93 %  92 %   Oxygen Saturation (Exit)  99 %  97 %  97 %  98 %  97 %   Rating of Perceived Exertion (Exercise)  _5 Perceived Dyspnea (Exercise)  _6 Duration  Progress to 45 minutes of aerobic exercise without signs/symptoms of physical distress  Progress to 45 minutes of aerobic exercise without signs/symptoms of physical distress  Progress to 45 minutes of aerobic exercise without signs/symptoms of physical distress  Progress to 45 minutes of aerobic exercise without signs/symptoms of physical distress  Progress to 45 minutes of aerobic exercise without signs/symptoms of physical distress   Intensity  THRR unchanged  THRR unchanged  THRR unchanged  THRR unchanged  THRR unchanged     Progression   Progression  Continue to progress workloads to maintain intensity without signs/symptoms of  physical distress.  Continue to progress workloads to maintain intensity without signs/symptoms of physical distress.  Continue to progress workloads to maintain intensity without signs/symptoms of physical distress.  Continue to progress workloads to maintain intensity without signs/symptoms of physical distress.  Continue to progress workloads to maintain intensity without signs/symptoms of physical distress.     Resistance Training   Training Prescription  Yes  Yes  Yes  Yes  Yes   Weight  orange bands  orange bands  orange bands  orange bands  orange bands   Reps  10-15  10-15  10-15  10-15  10-15  Time  10 Minutes  10 Minutes  10 Minutes  10 Minutes  10 Minutes     Oxygen   Oxygen  Continuous  Continuous  Continuous  Continuous  Continuous   Liters  _0 Bike   Level  0.6  0.6  0.6  0.6  -   Minutes  _1 -     NuStep   Level  5  -  _2 Minutes  17  -  _3 METs  1.8  -  -  2.5  2.6     Track   Laps  10  11  -  10  10   Minutes  17  17  -  17  17   Row Name 12/21/17 1500 12/23/17 1610 12/28/17 1603 12/30/17 0950       Response to Exercise   Blood Pressure (Admit)  160/76  130/60  140/66  134/60    Blood Pressure (Exercise)  144/62  166/72  122/70  170/72    Blood Pressure (Exit)  144/80  122/40  136/64  144/70    Heart Rate (Admit)  104 bpm  94 bpm  106 bpm  100 bpm    Heart Rate (Exercise)  123 bpm  117 bpm  127 bpm  124 bpm    Heart Rate (Exit)  101 bpm  99 bpm  98 bpm  101 bpm    Oxygen Saturation (Admit)  96 %  97 %  98 %  96 %    Oxygen Saturation (Exercise)  93 %  91 %  93 %  93 %    Oxygen Saturation (Exit)  99 %  97 %  97 %  98 %    Rating of Perceived Exertion (Exercise)  _4 Perceived Dyspnea (Exercise)  2  3._5 Duration  Progress to 45 minutes of aerobic exercise without signs/symptoms of physical distress  Progress to 45 minutes of aerobic exercise without signs/symptoms of physical distress  Progress  to 45 minutes of aerobic exercise without signs/symptoms of physical distress  Progress to 45 minutes of aerobic exercise without signs/symptoms of physical distress    Intensity  THRR unchanged  THRR unchanged  THRR unchanged  THRR unchanged      Progression   Progression  Continue to progress workloads to maintain intensity without signs/symptoms of physical distress.  Continue to progress workloads to maintain intensity without signs/symptoms of physical distress.  Continue to progress workloads to maintain intensity without signs/symptoms of physical distress.  Continue to progress workloads to maintain intensity without signs/symptoms of physical distress.      Resistance Training   Training Prescription  Yes  Yes  Yes  Yes    Weight  orange bands  orange bands  orange bands  orange bands    Reps  10-15  10-15  10-15  10-15    Time  10 Minutes  10 Minutes  10 Minutes  10 Minutes      Oxygen   Oxygen  Continuous  Continuous  Continuous  Continuous    Liters  _6 Bike   Level  0.6  0.6  0.6  0.6    Minutes  _0 NuStep   Level  6  -  6  6    Minutes  17  -  17  17    METs  2.4  -  2.6  2.4      Track   Laps  _1 -    Minutes  _2 -       Exercise Comments: Exercise Comments    Row Name 10/28/17 0708           Exercise Comments  Home exercise completed          Exercise Goals and Review: Exercise Goals    Row Name 10/05/17 0854             Exercise Goals   Increase Physical Activity  Yes       Intervention  Provide advice, education, support and counseling about physical activity/exercise needs.;Develop an individualized exercise prescription for aerobic and resistive training based on initial evaluation findings, risk stratification, comorbidities and participant's personal goals.       Expected Outcomes  Achievement of increased cardiorespiratory fitness and enhanced flexibility, muscular endurance and strength shown  through measurements of functional capacity and personal statement of participant.       Increase Strength and Stamina  Yes       Intervention  Provide advice, education, support and counseling about physical activity/exercise needs.;Develop an individualized exercise prescription for aerobic and resistive training based on initial evaluation findings, risk stratification, comorbidities and participant's personal goals.       Expected Outcomes  Achievement of increased cardiorespiratory fitness and enhanced flexibility, muscular endurance and strength shown through measurements of functional capacity and personal statement of participant.       Able to understand and use rate of perceived exertion (RPE) scale  Yes       Intervention  Provide education and explanation on how to use RPE scale       Expected Outcomes  Long Term:  Able to use RPE to guide intensity level when exercising independently       Able to understand and use Dyspnea scale  Yes       Intervention  Provide education and explanation on how to use Dyspnea scale       Expected Outcomes  Short Term: Able to use Dyspnea scale daily in rehab to express subjective sense of shortness of breath during exertion;Long Term: Able to use Dyspnea scale to guide intensity level when exercising independently       Knowledge and understanding of Target Heart Rate Range (THRR)  Yes       Intervention  Provide education and explanation of THRR including how the numbers were predicted and where they are located for reference       Expected Outcomes  Short Term: Able to state/look up THRR;Short Term: Able to use daily as guideline for intensity in rehab;Long Term: Able to use THRR to govern intensity when exercising independently       Understanding of Exercise Prescription  Yes       Intervention  Provide education, explanation, and written materials on patient's individual exercise prescription       Expected Outcomes  Short Term: Able to explain  program exercise prescription;Long Term: Able to explain home exercise prescription to exercise independently          Exercise Goals Re-Evaluation : Exercise Goals Re-Evaluation  [place] Name 10/11/17 1647 11/01/17 1507 11/18/17 1018 12/17/17 1514 01/07/18 1139     Exercise Goal Re-Evaluation   Exercise Goals Review  Increase Strength and Stamina;Increase Physical Activity;Able to understand and use Dyspnea scale;Able to understand and use rate of perceived exertion (RPE) scale;Knowledge and understanding of Target Heart Rate Range (THRR);Understanding of Exercise Prescription  Increase Physical Activity;Able to understand and use rate of perceived exertion (RPE) scale;Knowledge and understanding of Target Heart Rate Range (THRR);Understanding of Exercise Prescription;Increase Strength and Stamina;Able to understand and use Dyspnea scale  Increase Physical Activity;Able to understand and use rate of perceived exertion (RPE) scale;Knowledge and understanding of Target Heart Rate Range (THRR);Understanding of Exercise Prescription;Increase Strength and Stamina;Able to understand and use Dyspnea scale  Increase Strength and Stamina;Increase Physical Activity;Able to understand and use Dyspnea scale;Able to understand and use rate of perceived exertion (RPE) scale;Knowledge and understanding of Target Heart Rate Range (THRR);Understanding of Exercise Prescription  Increase Strength and Stamina;Increase Physical Activity;Able to understand and use Dyspnea scale;Able to understand and use rate of perceived exertion (RPE) scale;Knowledge and understanding of Target Heart Rate Range (THRR);Understanding of Exercise Prescription   Comments  Patient has only attended one exercise session. Will cont. to monitor and progress as able.   Patient is progressing slowly. Doctor has placed an order for a POC so she can more easily be active with the oxygen. Patient has only attended 5 exercise sessions--will cont. to monitor  and progress as able.   Patient is progressing slowly. Patient is able to walk up to 9 laps (296f) each in 15 minutes. Will cont. to monitor and progress as able.   Patient is progressing in program despite her level of deconditioning and disease progression. She has seen a benefit in the rehab and has been instructed and encouraged to exercise at home. Will cont. to monitor and motivate and encourage to cont. exercising in our maintenance program.   Patient is progressing in program despite her level of deconditioning and disease progression. She has seen a benefit in the rehab and has been instructed and encouraged to exercise at home. Will cont. to monitor and motivate and encourage to cont. exercising in our maintenance program.    Expected Outcomes  Through exercise at rehab and at home, patient will increase strength and stamina and will find that ADL's are easier to preform.   Through exercise at rehab and at home, patient will increase strength and stamina and find that ADL's are easier to preform.   Through exercise at rehab and at home, patient will increase strength and stamina and find that ADL's are easier to preform.   Through exercise at rehab and at home, patient will increase endurance and strength. Patient will also be able to perform ADL's with less shortness of breath and fatigue.  Through exercise at rehab and at home, patient will increase endurance and strength. Patient will also be able to perform ADL's with less shortness of breath and fatigue.      Discharge Exercise Prescription (Final Exercise Prescription Changes): Exercise Prescription Changes - 12/30/17 0950      Response to Exercise   Blood Pressure (Admit)  134/60    Blood Pressure (Exercise)  170/72    Blood Pressure (Exit)  144/70    Heart Rate (Admit)  100 bpm    Heart Rate (Exercise)  124 bpm    Heart Rate (Exit)  101 bpm    Oxygen Saturation (Admit)  96 %    Oxygen Saturation (Exercise)  93 %    Oxygen  Saturation (Exit)  98 %    Rating of Perceived Exertion (Exercise)  13    Perceived Dyspnea (Exercise)  1    Duration  Progress to 45 minutes of aerobic exercise without signs/symptoms of physical distress    Intensity  THRR unchanged      Progression   Progression  Continue to progress workloads to maintain intensity without signs/symptoms of physical distress.      Resistance Training   Training Prescription  Yes    Weight  orange bands    Reps  10-15    Time  10 Minutes      Oxygen   Oxygen  Continuous    Liters  2      Bike   Level  0.6    Minutes  17      NuStep   Level  6    Minutes  17    METs  2.4       Nutrition:  Target Goals: Understanding of nutrition guidelines, daily intake of sodium <1553m, cholesterol <2083m calories 30% from fat and 7% or less from saturated fats, daily to have 5 or more servings of fruits and vegetables.  Biometrics: Pre Biometrics - 10/05/17 0930      Pre Biometrics   Grip Strength  24 kg        Nutrition Therapy Plan and Nutrition Goals: Nutrition Therapy & Goals - 10/29/17 1411      Nutrition Therapy   Diet  Heart Healthy      Personal Nutrition Goals   Nutrition Goal  Identify food quantities necessary to achieve wt loss of  -2# per week to a goal wt loss of 6-24 lb at graduation from pulmonary rehab.    Personal Goal #2  Describe the benefit of including fruits, vegetables, whole grains, and low-fat dairy products in a healthy meal plan.      Intervention Plan   Intervention  Prescribe, educate and counsel regarding individualized specific dietary modifications aiming towards targeted core components such as weight, hypertension, lipid management, diabetes, heart failure and other comorbidities.    Expected Outcomes  Short Term Goal: Understand basic principles of dietary content, such as calories, fat, sodium, cholesterol and nutrients.;Long Term Goal: Adherence to prescribed nutrition plan.       Nutrition  Assessments: Nutrition Assessments - 10/29/17 1407      Rate Your Plate Scores   Pre Score  42       Nutrition Goals Re-Evaluation:   Nutrition Goals Discharge (Final Nutrition Goals Re-Evaluation):   Psychosocial: Target Goals: Acknowledge presence or absence of significant depression and/or stress, maximize coping skills, provide positive support system. Participant is able to verbalize types and ability to use techniques and skills needed for reducing stress and depression.  Initial Review & Psychosocial Screening: Initial Psych Review & Screening - 10/05/17 0911      Initial Review   Current issues with  None Identified      Family Dynamics   Good Support System?  Yes      Barriers   Psychosocial barriers to participate in program  There are no identifiable barriers or psychosocial needs.      Screening Interventions   Interventions  Encouraged to exercise       Quality of Life Scores:  Scores of 19 and below usually indicate a poorer quality of life in these areas.  A difference of  2-3 points is a clinically meaningful difference.  A difference of 2-3 points in the total score of the Quality of Life Index has been associated with significant improvement in overall quality of life, self-image, physical symptoms, and general health in studies assessing change in quality of life.   PHQ-9: Recent Review Flowsheet Data    Depression screen [facility] 2/9 10/05/2017 05/31/2017 03/12/2016 03/12/2016 07/27/2014   Decreased Interest 0 0 0 0 0   Down, Depressed, Hopeless 0 0 0 0 0   PHQ - 2 Score 0 0 0 0 0   Altered sleeping 0 - - - -   Tired, decreased energy 0 - - - -   Change in appetite 0 - - - -   Feeling bad or failure about yourself  0 - - - -   Trouble concentrating 0 - - - -   Moving slowly or fidgety/restless 0 - - - -   Suicidal thoughts 0 - - - -   PHQ-9 Score 0 - - - -   Difficult doing work/chores Not difficult at all - - - -     Interpretation of Total Score   Total Score Depression Severity:  1-4 = Minimal depression, 5-9 = Mild depression, 10-14 = Moderate depression, 15-19 = Moderately severe depression, 20-27 = Severe depression   Psychosocial Evaluation and Intervention: Psychosocial Evaluation - 10/05/17 0912      Psychosocial Evaluation & Interventions   Interventions  Encouraged to exercise with the program and follow exercise prescription    Expected Outcomes  patient will remain free from psychosocial barriers to participation in pulmonary rehab    Continue Psychosocial Services   No Follow up required       Psychosocial Re-Evaluation: Psychosocial Re-Evaluation    Row Name 10/12/17 0955 11/02/17 0956 11/19/17 0901 12/20/17 1020 01/10/18 0904     Psychosocial Re-Evaluation   Current issues with  None Identified  None Identified  None Identified  None Identified  None Identified   Interventions  Encouraged to attend Pulmonary Rehabilitation for the exercise  Encouraged to attend Pulmonary Rehabilitation for the exercise  Encouraged to attend Pulmonary Rehabilitation for the exercise  Encouraged to attend Pulmonary Rehabilitation for the exercise  Encouraged to attend Pulmonary Rehabilitation for the exercise   Continue Psychosocial Services   No Follow up required  No Follow up required  No Follow up required  No Follow up required  No Follow up required      Psychosocial Discharge (Final Psychosocial Re-Evaluation): Psychosocial Re-Evaluation - 01/10/18 0904      Psychosocial Re-Evaluation   Current issues with  None Identified    Interventions  Encouraged to attend Pulmonary Rehabilitation for the exercise    Continue Psychosocial Services   No Follow up required       Education: Education Goals: Education classes will be provided on a weekly basis, covering required topics. Participant will state understanding/return demonstration of topics presented.  Learning Barriers/Preferences: Learning Barriers/Preferences -  10/05/17 4076      Learning Barriers/Preferences   Learning Barriers  None    Learning Preferences  Computer/Internet;Group Instruction;Individual Instruction;Skilled Demonstration;Verbal Instruction;Video;Written Material       Education Topics: Risk Factor Reduction:  -Group instruction that is supported by a PowerPoint presentation. Instructor discusses the definition of a risk factor, different risk factors for pulmonary disease, and how the heart and lungs work together.     Nutrition for Pulmonary Patient:  -Group instruction provided by PowerPoint slides, verbal discussion, and written materials to support subject matter. The instructor  gives an explanation and review of healthy diet recommendations, which includes a discussion on weight management, recommendations for fruit and vegetable consumption, as well as protein, fluid, caffeine, fiber, sodium, sugar, and alcohol. Tips for eating when patients are short of breath are discussed.   PULMONARY REHAB OTHER RESPIRATORY from 01/06/2018 in [place]  Date  12/02/17  Educator  RD      Pursed Lip Breathing:  -Group instruction that is supported by demonstration and informational handouts. Instructor discusses the benefits of pursed lip and diaphragmatic breathing and detailed demonstration on how to preform both.     Oxygen Safety:  -Group instruction provided by PowerPoint, verbal discussion, and written material to support subject matter. There is an overview of "What is Oxygen" and "Why do we need it".  Instructor also reviews how to create a safe environment for oxygen use, the importance of using oxygen as prescribed, and the risks of noncompliance. There is a brief discussion on traveling with oxygen and resources the patient may utilize.   PULMONARY REHAB OTHER RESPIRATORY from 01/06/2018 in [place]  Date  01/06/18  Educator  rn  Instruction Review Code  3-  Needs Reinforcement      Oxygen Equipment:  -Group instruction provided by [facility] Staff utilizing handouts, written materials, and equipment demonstrations.   PULMONARY REHAB OTHER RESPIRATORY from 01/06/2018 in [place]  Date  10/28/17  Educator  Ace Gins      Signs and Symptoms:  -Group instruction provided by written material and verbal discussion to support subject matter. Warning signs and symptoms of infection, stroke, and heart attack are reviewed and when to call the physician/911 reinforced. Tips for preventing the spread of infection discussed.   Advanced Directives:  -Group instruction provided by verbal instruction and written material to support subject matter. Instructor reviews Advanced Directive laws and proper instruction for filling out document.   Pulmonary Video:  -Group video education that reviews the importance of medication and oxygen compliance, exercise, good nutrition, pulmonary hygiene, and pursed lip and diaphragmatic breathing for the pulmonary patient.   Exercise for the Pulmonary Patient:  -Group instruction that is supported by a PowerPoint presentation. Instructor discusses benefits of exercise, core components of exercise, frequency, duration, and intensity of an exercise routine, importance of utilizing pulse oximetry during exercise, safety while exercising, and options of places to exercise outside of rehab.     Pulmonary Medications:  -Verbally interactive group education provided by instructor with focus on inhaled medications and proper administration.   PULMONARY REHAB OTHER RESPIRATORY from 01/06/2018 in [place]  Date  12/16/17  Educator  pharm D      Anatomy and Physiology of the Respiratory System and Intimacy:  -Group instruction provided by PowerPoint, verbal discussion, and written material to support subject matter. Instructor reviews respiratory cycle and  anatomical components of the respiratory system and their functions. Instructor also reviews differences in obstructive and restrictive respiratory diseases with examples of each. Intimacy, Sex, and Sexuality differences are reviewed with a discussion on how relationships can change when diagnosed with pulmonary disease. Common sexual concerns are reviewed.   PULMONARY REHAB OTHER RESPIRATORY from 01/06/2018 in [place]  Date  11/18/17  Educator  rn      MD DAY -A group question and answer session with a medical doctor that allows participants to ask questions that relate to their pulmonary  disease state.   PULMONARY REHAB OTHER RESPIRATORY from 01/06/2018 in [place]  Date  12/09/17  Educator  Dr. Nelda Marseille      OTHER EDUCATION -Group or individual verbal, written, or video instructions that support the educational goals of the pulmonary rehab program.   PULMONARY REHAB OTHER RESPIRATORY from 01/06/2018 in [place]  Date  12/23/17 [Holiday Eating]  Educator  Cloyde Reams [sedentary Lifestyles]  Instruction Review Code  1- Verbalizes Understanding      Holiday Eating Survival Tips:  -Group instruction provided by PowerPoint slides, verbal discussion, and written materials to support subject matter. The instructor gives patients tips, tricks, and techniques to help them not only survive but enjoy the holidays despite the onslaught of food that accompanies the holidays.   Knowledge Questionnaire Score: Knowledge Questionnaire Score - 10/05/17 0909      Knowledge Questionnaire Score   Pre Score  11/13       Core Components/Risk Factors/Patient Goals at Admission: Personal Goals and Risk Factors at Admission - 10/05/17 0910      Core Components/Risk Factors/Patient Goals on Admission   Improve shortness of breath with ADL's  Yes    Intervention  Provide education, individualized exercise plan  and daily activity instruction to help decrease symptoms of SOB with activities of daily living.    Expected Outcomes  Short Term: Achieves a reduction of symptoms when performing activities of daily living.    Develop more efficient breathing techniques such as purse lipped breathing and diaphragmatic breathing; and practicing self-pacing with activity  Yes    Intervention  Provide education, demonstration and support about specific breathing techniuqes utilized for more efficient breathing. Include techniques such as pursed lipped breathing, diaphragmatic breathing and self-pacing activity.    Expected Outcomes  Short Term: Participant will be able to demonstrate and use breathing techniques as needed throughout daily activities.    Increase knowledge of respiratory medications and ability to use respiratory devices properly   Yes    Intervention  Provide education and demonstration as needed of appropriate use of medications, inhalers, and oxygen therapy.    Expected Outcomes  Short Term: Achieves understanding of medications use. Understands that oxygen is a medication prescribed by physician. Demonstrates appropriate use of inhaler and oxygen therapy.       Core Components/Risk Factors/Patient Goals Review:  Goals and Risk Factor Review    Row Name 10/12/17 0954 11/02/17 0954 11/19/17 0901 12/20/17 1018 01/10/18 0901     Core Components/Risk Factors/Patient Goals Review   Personal Goals Review  Improve shortness of breath with ADL's;Increase knowledge of respiratory medications and ability to use respiratory devices properly.;Develop more efficient breathing techniques such as purse lipped breathing and diaphragmatic breathing and practicing self-pacing with activity.  Improve shortness of breath with ADL's;Develop more efficient breathing techniques such as purse lipped breathing and diaphragmatic breathing and practicing self-pacing with activity.  Improve shortness of breath with  ADL's;Develop more efficient breathing techniques such as purse lipped breathing and diaphragmatic breathing and practicing self-pacing with activity.  Improve shortness of breath with ADL's;Develop more efficient breathing techniques such as purse lipped breathing and diaphragmatic breathing and practicing self-pacing with activity.  Improve shortness of breath with ADL's;Develop more efficient breathing techniques such as purse lipped breathing and diaphragmatic breathing and practicing self-pacing with activity.   Review  Has only attended 1 exercise session, too early to see progression toward goals  3rd week of exercise, workloads have been increased,  responding appropriately  continues to increase workloads as tolerated  continues to make slow progress, able to exercise at a higher level with less shortness of breath  will graduate this week, has progressed to a stable level of exercise, has increased to walking 9-13 laps on the track and is held back d/t leg pain when walking, level 6 on nustep, and .6 on airdyne bike   Expected Outcomes  Should see progression of goals stated in the next 30 days  continued increase in workloads with appropriate response  see admission goals  see admission goals  see admission goals      Core Components/Risk Factors/Patient Goals at Discharge (Final Review):  Goals and Risk Factor Review - 01/10/18 0901      Core Components/Risk Factors/Patient Goals Review   Personal Goals Review  Improve shortness of breath with ADL's;Develop more efficient breathing techniques such as purse lipped breathing and diaphragmatic breathing and practicing self-pacing with activity.    Review  will graduate this week, has progressed to a stable level of exercise, has increased to walking 9-13 laps on the track and is held back d/t leg pain when walking, level 6 on nustep, and .6 on airdyne bike    Expected Outcomes  see admission goals       ITP Comments:   Comments:ITP  REVIEW Pt is making expected progress toward pulmonary rehab goals after completing 20 sessions. Recommend continued exercise, life style modification, education, and utilization of breathing techniques to increase stamina and strength and decrease shortness of breath with exertion.

## 2018-01-11 ENCOUNTER — Encounter (HOSPITAL_COMMUNITY): Payer: Medicare Other

## 2018-01-11 ENCOUNTER — Encounter (HOSPITAL_COMMUNITY)
Admission: RE | Admit: 2018-01-11 | Discharge: 2018-01-11 | Disposition: A | Discharge status: Home / Self Care | Payer: Medicare Other | Source: Ambulatory Visit | Attending: Pulmonary Disease | Admitting: Pulmonary Disease

## 2018-01-11 DIAGNOSIS — Z87891 Personal history of nicotine dependence: Secondary | ICD-10-CM | POA: Diagnosis not present

## 2018-01-11 DIAGNOSIS — E785 Hyperlipidemia, unspecified: Secondary | ICD-10-CM | POA: Diagnosis not present

## 2018-01-11 DIAGNOSIS — J8410 Pulmonary fibrosis, unspecified: Secondary | ICD-10-CM

## 2018-01-11 DIAGNOSIS — Z85118 Personal history of other malignant neoplasm of bronchus and lung: Secondary | ICD-10-CM | POA: Diagnosis not present

## 2018-01-11 DIAGNOSIS — Z79899 Other long term (current) drug therapy: Secondary | ICD-10-CM | POA: Diagnosis not present

## 2018-01-11 DIAGNOSIS — I10 Essential (primary) hypertension: Secondary | ICD-10-CM | POA: Diagnosis not present

## 2018-01-11 NOTE — Progress Notes (Signed)
Daily Session Note  Patient Details  Name: [name] [name] MRN: [id number] Date of Birth: Sep 15, 1941 Referring Provider:     PULMONARY REHAB OTHER RESP ORIENTATION from 10/05/2017 in [place]  Referring Provider  Dr. Elsworth Soho      Encounter Date: 01/11/2018  Check In: Session Check In - 01/11/18 1322      Check-In   Location  [facility]    Staff Present  Rosebud Poles, RN, BSN;Molly diVincenzo, MS, ACSM RCEP, Exercise Physiologist;Kameryn Davern Rollene Rotunda, RN, Roque Cash, RN    Supervising physician immediately available to respond to emergencies  [facility] immediately available    Physician(s)  Dr. Lonny Prude    Medication changes reported      No    Fall or balance concerns reported     No    Tobacco Cessation  No Change    Warm-up and Cool-down  Performed as group-led instruction    Resistance Training Performed  Yes    VAD Patient?  No      Pain Assessment   Currently in Pain?  No/denies    Multiple Pain Sites  No       Capillary Blood Glucose: No results found for this or any previous visit (from the past 24 hour(s)).    Social History   Tobacco Use  Smoking Status Former Smoker  . Packs/day: 1.00  . Years: 40.00  . Pack years: 40.00  . Types: Cigarettes  . Last attempt to quit: 11/03/2012  . Years since quitting: 5.1  Smokeless Tobacco Never Used    Goals Met:  Independence with exercise equipment Improved SOB with ADL's Using PLB without cueing & demonstrates good technique Exercise tolerated well No report of cardiac concerns or symptoms Strength training completed today  Goals Unmet:  Not Applicable  Comments: Service time is from 1330 to 1510   Dr. Rush Farmer is Medical Director for Pulmonary Rehab at [facility].

## 2018-01-12 ENCOUNTER — Encounter (HOSPITAL_COMMUNITY): Payer: Self-pay | Admitting: *Deleted

## 2018-01-13 ENCOUNTER — Encounter (HOSPITAL_COMMUNITY)
Admission: RE | Admit: 2018-01-13 | Discharge: 2018-01-13 | Disposition: A | Discharge status: Home / Self Care | Payer: Medicare Other | Source: Ambulatory Visit | Attending: Pulmonary Disease | Admitting: Pulmonary Disease

## 2018-01-13 ENCOUNTER — Encounter (HOSPITAL_COMMUNITY): Payer: Medicare Other

## 2018-01-13 DIAGNOSIS — I10 Essential (primary) hypertension: Secondary | ICD-10-CM | POA: Diagnosis not present

## 2018-01-13 DIAGNOSIS — Z79899 Other long term (current) drug therapy: Secondary | ICD-10-CM | POA: Diagnosis not present

## 2018-01-13 DIAGNOSIS — J8410 Pulmonary fibrosis, unspecified: Secondary | ICD-10-CM

## 2018-01-13 DIAGNOSIS — Z85118 Personal history of other malignant neoplasm of bronchus and lung: Secondary | ICD-10-CM | POA: Diagnosis not present

## 2018-01-13 DIAGNOSIS — E785 Hyperlipidemia, unspecified: Secondary | ICD-10-CM | POA: Diagnosis not present

## 2018-01-13 DIAGNOSIS — Z87891 Personal history of nicotine dependence: Secondary | ICD-10-CM | POA: Diagnosis not present

## 2018-01-18 ENCOUNTER — Encounter (HOSPITAL_COMMUNITY): Payer: Medicare Other

## 2018-01-20 ENCOUNTER — Encounter (HOSPITAL_COMMUNITY): Payer: Medicare Other

## 2018-01-27 NOTE — Progress Notes (Signed)
Discharge Progress Report  Patient Details  Name: [name] [name] MRN: [id number] Date of Birth: 10/12/1941 Referring Provider:     PULMONARY REHAB OTHER RESP ORIENTATION from 10/05/2017 in [place]  Referring Provider  Dr. Elsworth Soho       Number of Visits: 21  Reason for Discharge:  Patient has met program and personal goals.  Smoking History:  Social History   Tobacco Use  Smoking Status Former Smoker  . Packs/day: 1.00  . Years: 40.00  . Pack years: 40.00  . Types: Cigarettes  . Last attempt to quit: 11/03/2012  . Years since quitting: 5.2  Smokeless Tobacco Never Used    Diagnosis:  Pulmonary fibrosis ([place])  ADL UCSD: Pulmonary Assessment Scores    Row Name 01/12/18 1114 01/17/18 0717       ADL UCSD   ADL Phase  Exit  Exit    SOB Score total  51  -      CAT Score   CAT Score  18 Exit  -      mMRC Score   mMRC Score  -  2       Initial Exercise Prescription: Initial Exercise Prescription - 10/05/17 1300      Date of Initial Exercise RX and Referring Provider   Date  10/05/17    Referring Provider  Dr. Elsworth Soho      Oxygen   Oxygen  Continuous    Liters  2      Bike   Level  0.4    Minutes  17      NuStep   Level  2    Minutes  17    METs  1.5      Track   Laps  10    Minutes  17      Prescription Details   Frequency (times per week)  2    Duration  Progress to 45 minutes of aerobic exercise without signs/symptoms of physical distress      Intensity   THRR 40-80% of Max Heartrate  58-117    Ratings of Perceived Exertion  11-13    Perceived Dyspnea  0-4      Progression   Progression  Continue progressive overload as per policy without signs/symptoms or physical distress.      Resistance Training   Training Prescription  Yes    Weight  orange bands    Reps  10-15       Discharge Exercise Prescription (Final Exercise Prescription Changes): Exercise Prescription Changes - 12/30/17 0950      Response  to Exercise   Blood Pressure (Admit)  134/60    Blood Pressure (Exercise)  170/72    Blood Pressure (Exit)  144/70    Heart Rate (Admit)  100 bpm    Heart Rate (Exercise)  124 bpm    Heart Rate (Exit)  101 bpm    Oxygen Saturation (Admit)  96 %    Oxygen Saturation (Exercise)  93 %    Oxygen Saturation (Exit)  98 %    Rating of Perceived Exertion (Exercise)  13    Perceived Dyspnea (Exercise)  1    Duration  Progress to 45 minutes of aerobic exercise without signs/symptoms of physical distress    Intensity  THRR unchanged      Progression   Progression  Continue to progress workloads to maintain intensity without signs/symptoms of physical distress.      Horticulturist, commercial Prescription  Yes    Weight  orange bands    Reps  10-15    Time  10 Minutes      Oxygen   Oxygen  Continuous    Liters  2      Bike   Level  0.6    Minutes  17      NuStep   Level  6    Minutes  17    METs  2.4       Functional Capacity: 6 Minute Walk    Row Name 01/17/18 0715         6 Minute Walk   Phase  Discharge     Distance  1265 feet     Walk Time  6 minutes     # of Rest Breaks  0     MPH  2.39     METS  2.84     RPE  17     Perceived Dyspnea   3     Symptoms  Yes (comment)     Comments  9/10 hip pain/used wheelchair during 13mt     Resting HR  90 bpm     Resting BP  150/60     Resting Oxygen Saturation   97 %     Exercise Oxygen Saturation  during 6 min walk  92 %     Max Ex. HR  122 bpm     Max Ex. BP  180/70     2 Minute Post BP  160/74       Interval HR   1 Minute HR  100     2 Minute HR  100     3 Minute HR  118     4 Minute HR  119     5 Minute HR  120     6 Minute HR  122     2 Minute Post HR  114     Interval Heart Rate?  Yes       Interval Oxygen   Interval Oxygen?  Yes     Baseline Oxygen Saturation %  97 %     1 Minute Oxygen Saturation %  92 %     1 Minute Liters of Oxygen  2 L     2 Minute Oxygen Saturation %  92 %     2 Minute Liters  of Oxygen  2 L     3 Minute Oxygen Saturation %  93 %     3 Minute Liters of Oxygen  2 L     4 Minute Oxygen Saturation %  93 %     4 Minute Liters of Oxygen  2 L     5 Minute Oxygen Saturation %  93 %     5 Minute Liters of Oxygen  2 L     6 Minute Oxygen Saturation %  92 %     6 Minute Liters of Oxygen  2 L     2 Minute Post Oxygen Saturation %  95 %     2 Minute Post Liters of Oxygen  2 L        Psychological, QOL, Others - Outcomes: PHQ 2/9: [facility] 10/05/2017 05/31/2017 03/12/2016 03/12/2016 07/27/2014  Decreased Interest 0 0 0 0 0  Down, Depressed, Hopeless 0 0 0 0 0  PHQ - 2 Score 0 0 0 0 0  Altered sleeping 0 - - - -  Tired, decreased energy 0 - - - -  Change in appetite 0 - - - -  Feeling bad or failure about yourself  0 - - - -  Trouble concentrating 0 - - - -  Moving slowly or fidgety/restless 0 - - - -  Suicidal thoughts 0 - - - -  PHQ-9 Score 0 - - - -  Difficult doing work/chores Not difficult at all - - - -    Quality of Life:   Personal Goals: Goals established at orientation with interventions provided to work toward goal. Personal Goals and Risk Factors at Admission - 10/05/17 0910      Core Components/Risk Factors/Patient Goals on Admission   Improve shortness of breath with ADL's  Yes    Intervention  Provide education, individualized exercise plan and daily activity instruction to help decrease symptoms of SOB with activities of daily living.    Expected Outcomes  Short Term: Achieves a reduction of symptoms when performing activities of daily living.    Develop more efficient breathing techniques such as purse lipped breathing and diaphragmatic breathing; and practicing self-pacing with activity  Yes    Intervention  Provide education, demonstration and support about specific breathing techniuqes utilized for more efficient breathing. Include techniques such as pursed lipped breathing, diaphragmatic breathing and self-pacing activity.     Expected Outcomes  Short Term: Participant will be able to demonstrate and use breathing techniques as needed throughout daily activities.    Increase knowledge of respiratory medications and ability to use respiratory devices properly   Yes    Intervention  Provide education and demonstration as needed of appropriate use of medications, inhalers, and oxygen therapy.    Expected Outcomes  Short Term: Achieves understanding of medications use. Understands that oxygen is a medication prescribed by physician. Demonstrates appropriate use of inhaler and oxygen therapy.        Personal Goals Discharge: Goals and Risk Factor Review    Row Name 10/12/17 0954 11/02/17 0954 11/19/17 0901 12/20/17 1018 01/10/18 0901     Core Components/Risk Factors/Patient Goals Review   Personal Goals Review  Improve shortness of breath with ADL's;Increase knowledge of respiratory medications and ability to use respiratory devices properly.;Develop more efficient breathing techniques such as purse lipped breathing and diaphragmatic breathing and practicing self-pacing with activity.  Improve shortness of breath with ADL's;Develop more efficient breathing techniques such as purse lipped breathing and diaphragmatic breathing and practicing self-pacing with activity.  Improve shortness of breath with ADL's;Develop more efficient breathing techniques such as purse lipped breathing and diaphragmatic breathing and practicing self-pacing with activity.  Improve shortness of breath with ADL's;Develop more efficient breathing techniques such as purse lipped breathing and diaphragmatic breathing and practicing self-pacing with activity.  Improve shortness of breath with ADL's;Develop more efficient breathing techniques such as purse lipped breathing and diaphragmatic breathing and practicing self-pacing with activity.   Review  Has only attended 1 exercise session, too early to see progression toward goals  3rd week of exercise, workloads  have been increased, responding appropriately  continues to increase workloads as tolerated  continues to make slow progress, able to exercise at a higher level with less shortness of breath  will graduate this week, has progressed to a stable level of exercise, has increased to walking 9-13 laps on the track and is held back d/t leg pain when walking, level 6 on nustep, and .6 on airdyne bike   Expected Outcomes  Should see progression of goals stated in the next 30 days  continued increase in workloads with  appropriate response  see admission goals  see admission goals  see admission goals      Exercise Goals and Review: Exercise Goals    Row Name 10/05/17 0854             Exercise Goals   Increase Physical Activity  Yes       Intervention  Provide advice, education, support and counseling about physical activity/exercise needs.;Develop an individualized exercise prescription for aerobic and resistive training based on initial evaluation findings, risk stratification, comorbidities and participant's personal goals.       Expected Outcomes  Achievement of increased cardiorespiratory fitness and enhanced flexibility, muscular endurance and strength shown through measurements of functional capacity and personal statement of participant.       Increase Strength and Stamina  Yes       Intervention  Provide advice, education, support and counseling about physical activity/exercise needs.;Develop an individualized exercise prescription for aerobic and resistive training based on initial evaluation findings, risk stratification, comorbidities and participant's personal goals.       Expected Outcomes  Achievement of increased cardiorespiratory fitness and enhanced flexibility, muscular endurance and strength shown through measurements of functional capacity and personal statement of participant.       Able to understand and use rate of perceived exertion (RPE) scale  Yes       Intervention  Provide  education and explanation on how to use RPE scale       Expected Outcomes  Long Term:  Able to use RPE to guide intensity level when exercising independently       Able to understand and use Dyspnea scale  Yes       Intervention  Provide education and explanation on how to use Dyspnea scale       Expected Outcomes  Short Term: Able to use Dyspnea scale daily in rehab to express subjective sense of shortness of breath during exertion;Long Term: Able to use Dyspnea scale to guide intensity level when exercising independently       Knowledge and understanding of Target Heart Rate Range (THRR)  Yes       Intervention  Provide education and explanation of THRR including how the numbers were predicted and where they are located for reference       Expected Outcomes  Short Term: Able to state/look up THRR;Short Term: Able to use daily as guideline for intensity in rehab;Long Term: Able to use THRR to govern intensity when exercising independently       Understanding of Exercise Prescription  Yes       Intervention  Provide education, explanation, and written materials on patient's individual exercise prescription       Expected Outcomes  Short Term: Able to explain program exercise prescription;Long Term: Able to explain home exercise prescription to exercise independently          Nutrition & Weight - Outcomes: Pre Biometrics - 10/05/17 0930      Pre Biometrics   Grip Strength  24 kg      Post Biometrics - 01/17/18 0718       Post  Biometrics   Grip Strength  22 kg       Nutrition: Nutrition Therapy & Goals - 10/29/17 1411      Nutrition Therapy   Diet  Heart Healthy      Personal Nutrition Goals   Nutrition Goal  Identify food quantities necessary to achieve wt loss of  -2# per week to a goal wt loss  of 6-24 lb at graduation from pulmonary rehab.    Personal Goal #2  Describe the benefit of including fruits, vegetables, whole grains, and low-fat dairy products in a healthy meal plan.       Intervention Plan   Intervention  Prescribe, educate and counsel regarding individualized specific dietary modifications aiming towards targeted core components such as weight, hypertension, lipid management, diabetes, heart failure and other comorbidities.    Expected Outcomes  Short Term Goal: Understand basic principles of dietary content, such as calories, fat, sodium, cholesterol and nutrients.;Long Term Goal: Adherence to prescribed nutrition plan.       Nutrition Discharge: Nutrition Assessments - 01/21/18 1040      Rate Your Plate Scores   Pre Score  42    Post Score  46       Education Questionnaire Score: Knowledge Questionnaire Score - 01/12/18 1113      Knowledge Questionnaire Score   Post Score  13/13       Goals reviewed with patient; copy given to patient.

## 2018-01-28 ENCOUNTER — Encounter: Payer: Self-pay | Admitting: Pulmonary Disease

## 2018-01-28 ENCOUNTER — Ambulatory Visit (INDEPENDENT_AMBULATORY_CARE_PROVIDER_SITE_OTHER): Payer: Medicare Other | Admitting: Pulmonary Disease

## 2018-01-28 VITALS — BP 132/82 | HR 88 | Ht 62.0 in | Wt 138.4 lb

## 2018-01-28 DIAGNOSIS — J84112 Idiopathic pulmonary fibrosis: Secondary | ICD-10-CM | POA: Diagnosis not present

## 2018-01-28 DIAGNOSIS — C3432 Malignant neoplasm of lower lobe, left bronchus or lung: Secondary | ICD-10-CM | POA: Diagnosis not present

## 2018-01-28 DIAGNOSIS — Z5181 Encounter for therapeutic drug level monitoring: Secondary | ICD-10-CM | POA: Diagnosis not present

## 2018-01-28 DIAGNOSIS — J8410 Pulmonary fibrosis, unspecified: Secondary | ICD-10-CM

## 2018-01-28 DIAGNOSIS — J9611 Chronic respiratory failure with hypoxia: Secondary | ICD-10-CM | POA: Diagnosis not present

## 2018-01-28 NOTE — Progress Notes (Signed)
   Subjective:    Patient ID: [name] [name], female    DOB: Sep 07, 1941, 77 y.o.   MRN: [id number]  HPI  77yo ex-smoker With a history of lung cancer  for FUof IPF She was able to quit smoking in 2013,more than 40 pack years.  She was seen in 2013 after an acute illness for interstitial pneumonia/ARDS requiring mechanical ventilation.Infiltrates seem to improve with diuresis and steroids and she was able to come off oxygen. Workup at that time included low positive RA factor with negative ANA, CCP, hypersensitivity panel She underwent SBRT on 2 separate occasions for stage IA lung cancer Left upper lobe squamous cell carcinoma status post SBRT completed 03/08/2013. Left lower lobe adenocarcinoma status post SBRT completed 01/29/2014.   She was finally able to get OFEV since 2/14.  She completed pulmonary rehab program in January and really benefited from this plan She is compliant with portable oxygen but occasionally does take it off when she is at rest because her saturations are 95% range. She had one episode of diarrhea in the last 2 weeks which subsided on taking Lomotil  I reviewed CT chest from 11/2017 which showed left lower lobe nodule and favoring UIP pattern Accompanied by son Sam     Significant tests/ events reviewed 10/2012 >>ARDS Requiring mechanical ventilation  PFTs 01/2013 - no obstruction, mild restriction, FVC 77%, DLCO decreased at 57%  PFTs 06/2017 show moderate restriction with ratio 93, FVC 56%, FEV1 70%, TLC 44% with DLCO 43%  PET 05/2017 subcarinal LN SUV 4.2  Review of Systems neg for any significant sore throat, dysphagia, itching, sneezing, nasal congestion or excess/ purulent secretions, fever, chills, sweats, unintended wt loss, pleuritic or exertional cp, hempoptysis, orthopnea pnd or change in chronic leg swelling. Also denies presyncope, palpitations, heartburn, abdominal pain, nausea, vomiting, diarrhea or change in bowel or urinary  habits, dysuria,hematuria, rash, arthralgias, visual complaints, headache, numbness weakness or ataxia.     Objective:   Physical Exam  Gen. Pleasant, well-nourished, in no distress ENT - no thrush, no post nasal drip Neck: No JVD, no thyromegaly, no carotid bruits Lungs: no use of accessory muscles, no dullness to percussion, BL 1/2 rales no rhonchi  Cardiovascular: Rhythm regular, heart sounds  normal, no murmurs or gallops, no peripheral edema Musculoskeletal: No deformities, no cyanosis or clubbing        Assessment & Plan:

## 2018-01-28 NOTE — Assessment & Plan Note (Signed)
Continue O2 O2 3 L pulse on exertion, and 2 L during sleep. Okay to stay off when at rest

## 2018-01-28 NOTE — Assessment & Plan Note (Signed)
Follow-up surveillance CT in 6 months, may plan on high-resolution

## 2018-01-28 NOTE — Patient Instructions (Signed)
Okay to increase O FEV to 150 twice daily. Check blood work for liver tests on March 15 and again in mid-April  Congratulations on completing pulmonary rehab, continue her exercise program. Okay to stop taking prednisone

## 2018-01-28 NOTE — Assessment & Plan Note (Signed)
Okay to increase O FEV to 150 twice daily. Check blood work for liver tests on March 15 and again in mid-April  Congratulations on completing pulmonary rehab, continue her exercise program. Okay to stop taking prednisone

## 2018-02-11 ENCOUNTER — Other Ambulatory Visit (INDEPENDENT_AMBULATORY_CARE_PROVIDER_SITE_OTHER): Payer: Medicare Other

## 2018-02-11 DIAGNOSIS — Z5181 Encounter for therapeutic drug level monitoring: Secondary | ICD-10-CM

## 2018-02-11 LAB — HEPATIC FUNCTION PANEL
ALT: 17 U/L (ref 0–35)
AST: 27 U/L (ref 0–37)
Albumin: 4.4 g/dL (ref 3.5–5.2)
Alkaline Phosphatase: 67 U/L (ref 39–117)
Bilirubin, Direct: 0.2 mg/dL (ref 0.0–0.3)
Total Bilirubin: 0.7 mg/dL (ref 0.2–1.2)
Total Protein: 6.9 g/dL (ref 6.0–8.3)

## 2018-03-01 NOTE — Progress Notes (Signed)
Reviewed & agree with plan  

## 2018-03-10 DIAGNOSIS — H04123 Dry eye syndrome of bilateral lacrimal glands: Secondary | ICD-10-CM | POA: Diagnosis not present

## 2018-03-10 DIAGNOSIS — Z961 Presence of intraocular lens: Secondary | ICD-10-CM | POA: Diagnosis not present

## 2018-03-10 DIAGNOSIS — H52223 Regular astigmatism, bilateral: Secondary | ICD-10-CM | POA: Diagnosis not present

## 2018-03-10 DIAGNOSIS — H5203 Hypermetropia, bilateral: Secondary | ICD-10-CM | POA: Diagnosis not present

## 2018-03-14 ENCOUNTER — Other Ambulatory Visit (INDEPENDENT_AMBULATORY_CARE_PROVIDER_SITE_OTHER): Payer: Medicare Other

## 2018-03-14 DIAGNOSIS — Z5181 Encounter for therapeutic drug level monitoring: Secondary | ICD-10-CM | POA: Diagnosis not present

## 2018-03-14 LAB — HEPATIC FUNCTION PANEL
ALT: 14 U/L (ref 0–35)
AST: 23 U/L (ref 0–37)
Albumin: 4.2 g/dL (ref 3.5–5.2)
Alkaline Phosphatase: 60 U/L (ref 39–117)
BILIRUBIN TOTAL: 0.7 mg/dL (ref 0.2–1.2)
Bilirubin, Direct: 0.1 mg/dL (ref 0.0–0.3)
Total Protein: 6.8 g/dL (ref 6.0–8.3)

## 2018-04-12 DIAGNOSIS — R197 Diarrhea, unspecified: Secondary | ICD-10-CM | POA: Diagnosis not present

## 2018-04-12 DIAGNOSIS — I10 Essential (primary) hypertension: Secondary | ICD-10-CM | POA: Diagnosis not present

## 2018-04-14 ENCOUNTER — Other Ambulatory Visit: Payer: Medicare Other

## 2018-04-18 ENCOUNTER — Telehealth: Payer: Self-pay | Admitting: Pulmonary Disease

## 2018-04-18 ENCOUNTER — Encounter: Payer: Self-pay | Admitting: Pulmonary Disease

## 2018-04-18 ENCOUNTER — Other Ambulatory Visit (INDEPENDENT_AMBULATORY_CARE_PROVIDER_SITE_OTHER): Payer: Medicare Other

## 2018-04-18 ENCOUNTER — Ambulatory Visit (INDEPENDENT_AMBULATORY_CARE_PROVIDER_SITE_OTHER): Payer: Medicare Other | Admitting: Pulmonary Disease

## 2018-04-18 VITALS — BP 128/68 | HR 100 | Ht 62.0 in | Wt 130.8 lb

## 2018-04-18 DIAGNOSIS — J84112 Idiopathic pulmonary fibrosis: Secondary | ICD-10-CM

## 2018-04-18 LAB — HEPATIC FUNCTION PANEL
ALK PHOS: 100 U/L (ref 39–117)
ALT: 26 U/L (ref 0–35)
AST: 26 U/L (ref 0–37)
Albumin: 4 g/dL (ref 3.5–5.2)
BILIRUBIN TOTAL: 1 mg/dL (ref 0.2–1.2)
Bilirubin, Direct: 0.3 mg/dL (ref 0.0–0.3)
Total Protein: 6.8 g/dL (ref 6.0–8.3)

## 2018-04-18 MED ORDER — OFEV 100 MG PO CAPS: 150.0000 mg | ORAL_CAPSULE | Freq: Every day | ORAL | 0 refills | Status: DC

## 2018-04-18 NOTE — Assessment & Plan Note (Signed)
  Contact [facility] to clarify about Inogen portable tank

## 2018-04-18 NOTE — Progress Notes (Signed)
   Subjective:    Patient ID: [name] [name], female    DOB: 1941-01-06, 77 y.o.   MRN: [id number]  HPI  77 yo ex-smoker With a history of lung cancer for FUof IPF She was able to quit smoking in 2013,more than 40 pack years.  She was seen in 2013 after an acute illness for interstitial pneumonia/ARDS requiring mechanical ventilation.Infiltrates seem to improve with diuresis and steroids and she was able to come off oxygen. Workup at that time included low positive RA factor withnegative ANA, CCP, hypersensitivity panel She underwent SBRT on 2 separate occasions for stage IA lung cancer Left upper lobe squamous cell carcinoma status post SBRT completed 03/08/2013. Left lower lobe adenocarcinoma status post SBRT completed 01/29/2014.  She completed pulmonary rehab program in January  She is compliant with portable oxygen and has [facility] a new [facility]  She is on OFEV since 12/2017 -has developed diarrhea over the last 2weeks She was running a low temperature of 97 degrees and initially thought but had a GI bug but symptoms have persisted, has to take Imodium twice daily  her diet has not changed   liver tests reviewed was fine in March and April   Has occasional cough breathing otherwise seems to be stable,        Significant tests/ events reviewed 10/2012 >>ARDS Requiring mechanical ventilation  PFTs 01/2013 - no obstruction, mild restriction, FVC 77%, DLCO decreased at 57%  PFTs 06/2017 show moderate restriction with ratio 93, FVC 56%, FEV1 70%, TLC 44% with DLCO 43%  03/2018 FVC 53%  PET 05/2017 subcarinal LN SUV 4.2  CT chest from 11/2017 which showed left lower lobe nodule and favoring UIP pattern  Review of Systems neg for any significant sore throat, dysphagia, itching, sneezing, nasal congestion or excess/ purulent secretions, fever, chills, sweats, unintended wt loss, pleuritic or exertional cp, hempoptysis, orthopnea pnd or change  in chronic leg swelling. Also denies presyncope, palpitations, heartburn, abdominal pain, nausea, vomiting, diarrhea or change in bowel or urinary habits, dysuria,hematuria, rash, arthralgias, visual complaints, headache, numbness weakness or ataxia.     Objective:   Physical Exam   Gen. Pleasant, thin, in no distress ENT - no thrush, no post nasal drip Neck: No JVD, no thyromegaly, no carotid bruits Lungs: no use of accessory muscles, no dullness to percussion, bibasal rales no rhonchi  Cardiovascular: Rhythm regular, heart sounds  normal, no murmurs or gallops, no peripheral edema Musculoskeletal: No deformities, no cyanosis or clubbing         Assessment & Plan:

## 2018-04-18 NOTE — Patient Instructions (Signed)
Decrease OFEV to 150 once daily. We will try to get 100 mg tablets and once you get those you can start 100 mg twice daily x 1 month  If tolerated better, call us at that point- we will plan to increase to 150 mg twice daily again  Liver tests today  Contact [facility] to clarify about Inogen portable tank

## 2018-04-18 NOTE — Assessment & Plan Note (Signed)
Follow-up CT next visit

## 2018-04-18 NOTE — Progress Notes (Signed)
LMTCB x1 on preferred phone number listed for patient.  

## 2018-04-18 NOTE — Addendum Note (Signed)
Addended by: Amado Coe on: 04/18/2018 11:12 AM   Modules accepted: Orders

## 2018-04-18 NOTE — Assessment & Plan Note (Signed)
Decrease OFEV to 150 once daily. We will try to get 100 mg tablets and once you get those you can start 100 mg twice daily x 1 month  If tolerated better, call us at that point- we will plan to increase to 150 mg twice daily again  Liver tests today

## 2018-04-18 NOTE — Telephone Encounter (Signed)
Notes recorded by Amado Coe, RN on 04/18/2018 at 4:42 PM EDT LMTCB x1 on preferred phone number listed for patient.  ------  Notes recorded by Rigoberto Noel, MD on 04/18/2018 at 1:41 PM EDT LFTs nml    Pt is aware of results. No further questions or concerns at this time.

## 2018-04-26 ENCOUNTER — Telehealth: Payer: Self-pay | Admitting: Pulmonary Disease

## 2018-04-26 NOTE — Telephone Encounter (Signed)
Instructions      Return in about 3 months (around 07/19/2018).  Decrease OFEV to 150 once daily. We will try to get 100 mg tablets and once you get those you can start 100 mg twice daily x 1 month  If tolerated better, call us at that point- we will plan to increase to 150 mg twice daily again      Spoke with pharmacist and verified direction of OFEV 100mg  bid. Nothing further is needed.

## 2018-04-26 NOTE — Telephone Encounter (Signed)
Called and spoke to pt. Pt states she is still getting nauseous with the Ofev 150mg  (taking once a day). Pt states she has not taken the Ofev today yet. Pt also states she spoke with [facility] and they are requesting a PA for pt's Ofev 100mg . Called [employer] at [phone] and spoke with a PA rep with the clinical review department and was advised that the pt's Ofev (150mg  and 100mg ) are still approved until 08/2019. Auth#: [id number] and reference#: [id number].   Called and spoke to pt. Informed her of the update and advised pt that if she is not contacted in the next few days to schedule a shipment to then call us back.   Will route to Cherina to follow up on.

## 2018-04-26 NOTE — Telephone Encounter (Signed)
Will follow up with patient on Thursday to see how she is feeling.

## 2018-04-28 NOTE — Telephone Encounter (Signed)
Spoke with patient. She is aware of RA's recs. She will hold the Ofev for 2 weeks.   Nothing else needed at time of call.

## 2018-04-28 NOTE — Telephone Encounter (Signed)
Attempted to call to see how patient is feeling, no answer, left message to call back.

## 2018-04-28 NOTE — Telephone Encounter (Signed)
Best I know OFEV does not have dental side effects. Since her side effects are bad, OK to STOP ofev for now & resume 100 mg in 2 weeks

## 2018-04-28 NOTE — Telephone Encounter (Signed)
Called and spoke with pt who states she has been having problems with nausea and diarrhea that started two weeks ago.  Pt thought she had a bug due to running a fever but then the fever went away and she was still having problems with the nausea and diarrhea.  Pt went to see PCP and was told she probably had a bug but then symptoms of diarrhea were due to the OFEV at this time.  Pt has been taking one 150mg  tablet daily since last OV with RA and she still reports having loose stools and nausea.  Pt has taken immodium to help with the diarrhea, which helped at first but then she will have both good days and bad days.  Pt states she received notice that the 100mg  tablet should arrive tomorrow, 5/31.  Pt stated she went to the dentist and was told she had developed pockets in her gums and wants to know if it could be due to the [place].  Dr. Elsworth Soho, please advise on this for pt. Thanks!

## 2018-04-28 NOTE — Telephone Encounter (Signed)
Pt states she has cut back to one 150mg  Ofev a day. Reports loose stools and nausea but has not thrown up. Cb is [phone] or Cell [phone].

## 2018-05-02 ENCOUNTER — Telehealth: Payer: Self-pay | Admitting: Pulmonary Disease

## 2018-05-02 NOTE — Telephone Encounter (Signed)
Follow-up in 4 to 6 weeks with NP

## 2018-05-02 NOTE — Telephone Encounter (Signed)
Called and spoke to patient. Advised her of RA's recommendation to come for follow up in 4-6 weeks with NP. Scheduled appointment for 06/13/18 at 1045. Nothing further needed at this time.

## 2018-05-02 NOTE — Telephone Encounter (Signed)
   Notes recorded by Rigoberto Noel, MD on 04/18/2018 at 1:41 PM EDT LFTs nml  Advised pt of results. Pt understood. She wanted to know if she was still going to do a scan and when she should follow up? Dr. Elsworth Soho please advise.     Patient Instructions by Rigoberto Noel, MD at 04/18/2018 10:15 AM  Author: Rigoberto Noel, MD Author Type: Physician Filed: 04/18/2018 10:59 AM  Note Status: Signed Cosign: Cosign Not Required Encounter Date: 04/18/2018  Editor: Rigoberto Noel, MD (Physician)    Decrease OFEV to 150 once daily. We will try to get 100 mg tablets and once you get those you can start 100 mg twice daily x 1 month  If tolerated better, call us at that point- we will plan to increase to 150 mg twice daily again  Liver tests today  Contact [facility] to clarify about Inogen portable tank    Instructions      Return in about 3 months (around 07/19/2018).  Decrease OFEV to 150 once daily. We will try to get 100 mg tablets and once you get those you can start 100 mg twice daily x 1 month  If tolerated better, call us at that point- we will plan to increase to 150 mg twice daily again  Liver tests today  Contact [facility] to clarify about Inogen portable tank

## 2018-05-12 ENCOUNTER — Telehealth: Payer: Self-pay | Admitting: Pulmonary Disease

## 2018-05-12 NOTE — Telephone Encounter (Signed)
Called and spoke with patient, advised her that we were calling her in regards to lab results. Advised of results. Nothing further needed.

## 2018-05-16 ENCOUNTER — Other Ambulatory Visit: Payer: Self-pay | Admitting: Pulmonary Disease

## 2018-05-24 DIAGNOSIS — M1712 Unilateral primary osteoarthritis, left knee: Secondary | ICD-10-CM | POA: Diagnosis not present

## 2018-05-24 DIAGNOSIS — E7800 Pure hypercholesterolemia, unspecified: Secondary | ICD-10-CM | POA: Diagnosis not present

## 2018-05-24 DIAGNOSIS — M25562 Pain in left knee: Secondary | ICD-10-CM | POA: Diagnosis not present

## 2018-05-24 DIAGNOSIS — I10 Essential (primary) hypertension: Secondary | ICD-10-CM | POA: Diagnosis not present

## 2018-05-24 DIAGNOSIS — J8410 Pulmonary fibrosis, unspecified: Secondary | ICD-10-CM | POA: Diagnosis not present

## 2018-05-24 DIAGNOSIS — M545 Low back pain: Secondary | ICD-10-CM | POA: Diagnosis not present

## 2018-06-04 DIAGNOSIS — M25562 Pain in left knee: Secondary | ICD-10-CM | POA: Diagnosis not present

## 2018-06-09 DIAGNOSIS — M48061 Spinal stenosis, lumbar region without neurogenic claudication: Secondary | ICD-10-CM | POA: Diagnosis not present

## 2018-06-09 DIAGNOSIS — M5432 Sciatica, left side: Secondary | ICD-10-CM | POA: Diagnosis not present

## 2018-06-09 DIAGNOSIS — M5136 Other intervertebral disc degeneration, lumbar region: Secondary | ICD-10-CM | POA: Diagnosis not present

## 2018-06-09 DIAGNOSIS — M1712 Unilateral primary osteoarthritis, left knee: Secondary | ICD-10-CM | POA: Diagnosis not present

## 2018-06-13 ENCOUNTER — Ambulatory Visit: Payer: Self-pay | Admitting: Adult Health

## 2018-06-14 ENCOUNTER — Ambulatory Visit (INDEPENDENT_AMBULATORY_CARE_PROVIDER_SITE_OTHER): Payer: Medicare Other | Admitting: Adult Health

## 2018-06-14 ENCOUNTER — Other Ambulatory Visit (INDEPENDENT_AMBULATORY_CARE_PROVIDER_SITE_OTHER): Payer: Medicare Other

## 2018-06-14 ENCOUNTER — Other Ambulatory Visit: Payer: Self-pay | Admitting: Pulmonary Disease

## 2018-06-14 ENCOUNTER — Encounter: Payer: Self-pay | Admitting: Adult Health

## 2018-06-14 VITALS — BP 138/78 | HR 88 | Ht 62.0 in | Wt 130.8 lb

## 2018-06-14 DIAGNOSIS — Z5181 Encounter for therapeutic drug level monitoring: Secondary | ICD-10-CM

## 2018-06-14 DIAGNOSIS — J9611 Chronic respiratory failure with hypoxia: Secondary | ICD-10-CM

## 2018-06-14 DIAGNOSIS — R911 Solitary pulmonary nodule: Secondary | ICD-10-CM | POA: Diagnosis not present

## 2018-06-14 DIAGNOSIS — J84112 Idiopathic pulmonary fibrosis: Secondary | ICD-10-CM

## 2018-06-14 LAB — HEPATIC FUNCTION PANEL
ALT: 16 U/L (ref 0–35)
AST: 25 U/L (ref 0–37)
Albumin: 4.4 g/dL (ref 3.5–5.2)
Alkaline Phosphatase: 72 U/L (ref 39–117)
BILIRUBIN DIRECT: 0.1 mg/dL (ref 0.0–0.3)
BILIRUBIN TOTAL: 0.7 mg/dL (ref 0.2–1.2)
TOTAL PROTEIN: 7.2 g/dL (ref 6.0–8.3)

## 2018-06-14 NOTE — Assessment & Plan Note (Addendum)
Stable on CT chest 11/2017 .  Previous lung cancer s/p SBRT x 2  Plan follow up CT chest in 11/2018

## 2018-06-14 NOTE — Assessment & Plan Note (Addendum)
Stable on OFEV . Tolerating well  Clinically without change  Check LFT today   Plan  Patient Instructions  Continue OFEV on current dose.  Continue on Oxygen 2 l/m rest and 3l/m walking  Follow up with Dr. Elsworth Soho  In 3 months and As needed   Please contact office for sooner follow up if symptoms do not improve or worsen or seek emergency care

## 2018-06-14 NOTE — Patient Instructions (Signed)
Continue OFEV on current dose.  Continue on Oxygen 2 l/m rest and 3l/m walking  Follow up with Dr. Elsworth Soho  In 3 months and As needed   Please contact office for sooner follow up if symptoms do not improve or worsen or seek emergency care

## 2018-06-14 NOTE — Assessment & Plan Note (Signed)
Cont on o2 .  

## 2018-06-14 NOTE — Progress Notes (Signed)
@Patient  ID: [name] [name], female    DOB: 02-17-1941, 77 y.o.   MRN: [id number]  Chief Complaint  Patient presents with  . Follow-up    IPF     Referring provider: Alroy Dust, L.Marlou Sa, MD  HPI: 77 yo female former smoker with history of lung cancer followed for ILD and chronic respiratory failure on O2 Left upper lobe squamous cell carcinoma status post SBRT completed 03/08/2013. Left lower lobe adenocarcinoma status post SBRT completed 01/29/2014.   Significant tests/ events reviewed 10/2012 >>ARDS Requiring mechanical ventilation Autoimmune Neg RA factor, ANA , CCP , HSP   PFTs 01/2013 - no obstruction, mild restriction, FVC 77%, DLCO decreased at 57%  PFTs 06/2017 show moderate restriction with ratio 93, FVC 56%, FEV1 70%, TLC 44% with DLCO 43%  underwent SBRT on 2 separate occasions for stage 1 A lung cancer  PET 05/2017 subcarinal LN SUV 4.2 CT chest 11/2017 LLL nodule , UIP pattern   Finished pulmonary rehab 11/2017  Started OFEV 12/2017    06/14/2018 Follow up : ILD , O2 RF  Pt returns for 2 month follow up . Says that breathing is doing well since last ov. She is followed for ILD , on OFEV since 12/2017 , initially had some diarrhea, n/v. Dose was decreased 100mg  Twice daily  . She is tolerating well now .  Weight is stable . Appetite is improved.  Denies cough . Activity level is okay , has DJD , back issues not able to exercise.  PVX is utd (2013)   History of lung cancer s/p SBRT x 2 . Last CT chest 11/2017 stable band of fibrosis in the left midlung.  No evidence of lung cancer recurrence.  Stable left lower lobe nodule.  ILD changes   Allergies  Allergen Reactions  . Penicillins Hives and Swelling    Has patient had a PCN reaction causing immediate rash, facial/tongue/throat swelling, SOB or lightheadedness with hypotension: Yes Has patient had a PCN reaction causing severe rash involving mucus membranes or skin necrosis: No Has patient had a PCN reaction  that required hospitalization: No Has patient had a PCN reaction occurring within the last 10 years: No If all of the above answers are "NO", then may proceed with Cephalosporin use.     Immunization History  Administered Date(s) Administered  . Influenza Whole 08/30/2012  . Influenza-Unspecified 08/30/2016, 08/30/2017  . Pneumococcal Polysaccharide-23 12/01/2011    Past Medical History:  Diagnosis Date  . Anxiety   . Arthritis    a. 10/2012 s/p Right THA.  Marland Kitchen Arthritis    "hands" (09/21/2017)  . Chronic lower back pain   . Complication of anesthesia    "couldn't wake me up after my hip surgery" (09/21/2017)  . Depression   . History of ARDS   . Hyperlipidemia   . Hypertension   . Lung cancer ([place]) 02/16/13   LUL lung SQUAMOUS CELL  . Lung cancer ([place]) dx'd 12/2013    LLL ADENOCARCINOMA  . Pulmonary fibrosis ([place]) dx'd ~ 07/2017  . Pulmonary nodule    a. 10/2012 CT: 80mm anterior LUL nodule w/ spiculated appearance - rec PET CT.  . Seasonal allergies    "I take Claritin" (09/21/2017)  . Sinus headache     Tobacco History: Social History   Tobacco Use  Smoking Status Former Smoker  . Packs/day: 1.00  . Years: 40.00  . Pack years: 40.00  . Types: Cigarettes  . Last attempt to quit: 11/03/2012  . Years since quitting:  5.6  Smokeless Tobacco Never Used   Counseling given: Not Answered   Outpatient Medications Prior to Visit  Medication Sig Dispense Refill  . acetaminophen (TYLENOL) 500 MG tablet Take 1,000 mg by mouth every 6 (six) hours as needed for pain.    Marland Kitchen amLODipine (NORVASC) 10 MG tablet Take 1 tablet (10 mg total) by mouth daily. 30 tablet 0  . atorvastatin (LIPITOR) 40 MG tablet Take 20 mg by mouth every morning.    . hydrochlorothiazide (HYDRODIURIL) 25 MG tablet Take 25 mg by mouth daily before breakfast.     . Hypromellose (ARTIFICIAL TEARS OP) Place 1 drop into both eyes daily.    Marland Kitchen loratadine (CLARITIN) 10 MG tablet Take 10 mg by mouth daily.    Marland Kitchen  OFEV 100 MG CAPS TAKE 1 CAPSULE BY MOUTH TWICE A DAY 60 capsule 0  . loperamide (IMODIUM) 2 MG capsule Take 2 mg by mouth as needed for diarrhea or loose stools.    . predniSONE (DELTASONE) 5 MG tablet Alternate 10mg  and 5mg  every other day for 2 weeks, then decrease to 5mg  daily and hold at this dose. (Patient not taking: Reported on 04/18/2018) 50 tablet 0   No facility-administered medications prior to visit.      Review of Systems  Constitutional:   No  weight loss, night sweats,  Fevers, chills,  +fatigue, or  lassitude.  HEENT:   No headaches,  Difficulty swallowing,  Tooth/dental problems, or  Sore throat,                No sneezing, itching, ear ache, nasal congestion, post nasal drip,   CV:  No chest pain,  Orthopnea, PND, swelling in lower extremities, anasarca, dizziness, palpitations, syncope.   GI  No heartburn, indigestion, abdominal pain, nausea, vomiting, diarrhea, change in bowel habits, loss of appetite, bloody stools.   Resp:   No chest wall deformity  Skin: no rash or lesions.  GU: no dysuria, change in color of urine, no urgency or frequency.  No flank pain, no hematuria   MS:  No joint pain or swelling.  No decreased range of motion.  No back pain.    Physical Exam  BP 138/78 (BP Location: Left Arm, Cuff Size: Normal)   Pulse 88   Ht 5\' 2"  (1.575 m)   Wt 130 lb 12.8 oz (59.3 kg)   SpO2 96%   BMI 23.92 kg/m   GEN: A/Ox3; pleasant , NAD, thin , elderly , on o2    HEENT:  [place]/AT,  EACs-clear, TMs-wnl, NOSE-clear, THROAT-clear, no lesions, no postnasal drip or exudate noted.   NECK:  Supple w/ fair ROM; no JVD; normal carotid impulses w/o bruits; no thyromegaly or nodules palpated; no lymphadenopathy.    RESP  Faint BB crackles  no accessory muscle use, no dullness to percussion  CARD:  RRR, no m/r/g, no peripheral edema, pulses intact, no cyanosis or clubbing.  GI:   Soft & nt; nml bowel sounds; no organomegaly or masses detected.   Musco: Warm  bil, no deformities or joint swelling noted.   Neuro: alert, no focal deficits noted.    Skin: Warm, no lesions or rashes    Lab Results:  CBC   ProBNP  Imaging: No results found.   Assessment & Plan:   IPF (idiopathic pulmonary fibrosis) ([facility]) Stable on OFEV . Tolerating well  Clinically without change  Check LFT today   Plan  Patient Instructions  Continue OFEV on current dose.  Continue  on Oxygen 2 l/m rest and 3l/m walking  Follow up with Dr. Elsworth Soho  In 3 months and As needed   Please contact office for sooner follow up if symptoms do not improve or worsen or seek emergency care        Chronic respiratory failure with hypoxia ([place]) Cont on o2 .   Pulmonary nodule Stable on CT chest 11/2017 .  Previous lung cancer s/p SBRT x 2  Plan follow up CT chest in 11/2018      Rexene Edison, NP 06/14/2018

## 2018-06-25 DIAGNOSIS — M5416 Radiculopathy, lumbar region: Secondary | ICD-10-CM | POA: Diagnosis not present

## 2018-06-25 DIAGNOSIS — M961 Postlaminectomy syndrome, not elsewhere classified: Secondary | ICD-10-CM | POA: Diagnosis not present

## 2018-06-25 DIAGNOSIS — M5136 Other intervertebral disc degeneration, lumbar region: Secondary | ICD-10-CM | POA: Diagnosis not present

## 2018-07-04 ENCOUNTER — Other Ambulatory Visit: Payer: Self-pay | Admitting: Pulmonary Disease

## 2018-07-11 DIAGNOSIS — M961 Postlaminectomy syndrome, not elsewhere classified: Secondary | ICD-10-CM | POA: Diagnosis not present

## 2018-07-15 DIAGNOSIS — M961 Postlaminectomy syndrome, not elsewhere classified: Secondary | ICD-10-CM | POA: Diagnosis not present

## 2018-07-19 DIAGNOSIS — M5416 Radiculopathy, lumbar region: Secondary | ICD-10-CM | POA: Diagnosis not present

## 2018-07-21 DIAGNOSIS — M5416 Radiculopathy, lumbar region: Secondary | ICD-10-CM | POA: Diagnosis not present

## 2018-07-21 DIAGNOSIS — M961 Postlaminectomy syndrome, not elsewhere classified: Secondary | ICD-10-CM | POA: Diagnosis not present

## 2018-07-25 DIAGNOSIS — M5416 Radiculopathy, lumbar region: Secondary | ICD-10-CM | POA: Diagnosis not present

## 2018-07-28 DIAGNOSIS — M5416 Radiculopathy, lumbar region: Secondary | ICD-10-CM | POA: Diagnosis not present

## 2018-08-04 DIAGNOSIS — M5416 Radiculopathy, lumbar region: Secondary | ICD-10-CM | POA: Diagnosis not present

## 2018-08-08 DIAGNOSIS — M5416 Radiculopathy, lumbar region: Secondary | ICD-10-CM | POA: Diagnosis not present

## 2018-08-09 DIAGNOSIS — M961 Postlaminectomy syndrome, not elsewhere classified: Secondary | ICD-10-CM | POA: Diagnosis not present

## 2018-08-09 DIAGNOSIS — M792 Neuralgia and neuritis, unspecified: Secondary | ICD-10-CM | POA: Diagnosis not present

## 2018-08-16 DIAGNOSIS — M961 Postlaminectomy syndrome, not elsewhere classified: Secondary | ICD-10-CM | POA: Diagnosis not present

## 2018-08-22 DIAGNOSIS — M5416 Radiculopathy, lumbar region: Secondary | ICD-10-CM | POA: Diagnosis not present

## 2018-08-26 DIAGNOSIS — M5416 Radiculopathy, lumbar region: Secondary | ICD-10-CM | POA: Diagnosis not present

## 2018-08-29 DIAGNOSIS — M5416 Radiculopathy, lumbar region: Secondary | ICD-10-CM | POA: Diagnosis not present

## 2018-09-01 DIAGNOSIS — M5416 Radiculopathy, lumbar region: Secondary | ICD-10-CM | POA: Diagnosis not present

## 2018-09-02 ENCOUNTER — Other Ambulatory Visit (INDEPENDENT_AMBULATORY_CARE_PROVIDER_SITE_OTHER): Payer: Medicare Other

## 2018-09-02 ENCOUNTER — Ambulatory Visit (INDEPENDENT_AMBULATORY_CARE_PROVIDER_SITE_OTHER): Payer: Medicare Other | Admitting: Pulmonary Disease

## 2018-09-02 ENCOUNTER — Encounter: Payer: Self-pay | Admitting: Pulmonary Disease

## 2018-09-02 VITALS — BP 122/70 | HR 66 | Ht 62.0 in | Wt 127.0 lb

## 2018-09-02 DIAGNOSIS — J84112 Idiopathic pulmonary fibrosis: Secondary | ICD-10-CM | POA: Diagnosis not present

## 2018-09-02 DIAGNOSIS — Z23 Encounter for immunization: Secondary | ICD-10-CM

## 2018-09-02 DIAGNOSIS — Z5181 Encounter for therapeutic drug level monitoring: Secondary | ICD-10-CM

## 2018-09-02 DIAGNOSIS — J8410 Pulmonary fibrosis, unspecified: Secondary | ICD-10-CM | POA: Diagnosis not present

## 2018-09-02 LAB — HEPATIC FUNCTION PANEL
ALBUMIN: 4.5 g/dL (ref 3.5–5.2)
ALT: 14 U/L (ref 0–35)
AST: 25 U/L (ref 0–37)
Alkaline Phosphatase: 74 U/L (ref 39–117)
Bilirubin, Direct: 0.1 mg/dL (ref 0.0–0.3)
Total Bilirubin: 0.7 mg/dL (ref 0.2–1.2)
Total Protein: 7.2 g/dL (ref 6.0–8.3)

## 2018-09-02 LAB — LIPID PANEL
CHOLESTEROL: 169 mg/dL (ref 0–200)
HDL: 72.6 mg/dL (ref >39.00)
LDL CALC: 79 mg/dL (ref 0–99)
NonHDL: 96.05
Total CHOL/HDL Ratio: 2
Triglycerides: 84 mg/dL (ref 0.0–149.0)
VLDL: 16.8 mg/dL (ref 0.0–40.0)

## 2018-09-02 NOTE — Addendum Note (Signed)
Addended by: Valerie Salts on: 09/02/2018 01:45 PM   Modules accepted: Orders

## 2018-09-02 NOTE — Assessment & Plan Note (Signed)
Flu shot today. Lung function drop 09/21/2017 but since then has stabilized Increase Ofev - 1 50 once daily and 100 for the second dose Call me back in 1 month to report how this trial is working  Schedule PFTs in 1 month. High-resolution CT chest in January 2020.

## 2018-09-02 NOTE — Progress Notes (Signed)
   Subjective:    Patient ID: [name] [name], female    DOB: 02/09/1941, 77 y.o.   MRN: [id number]  HPI  77 yo ex-smoker With a history of lung cancer for FUofIPF She was able to quit smoking in 2013,more than 40 pack years.  She was seen in 2013 after an acute illness for interstitial pneumonia/ARDS requiring mechanical ventilation.Infiltrates seem to improve with diuresis and steroids and she was able to come off oxygen. Workup at that time included low positive RA factor withnegative ANA, CCP, hypersensitivity panel She underwent SBRT on 2 separate occasions for stage IA lung cancer Left upper lobe squamous cell carcinoma status post SBRT completed 03/08/2013. Left lower lobe adenocarcinoma status post SBRT completed 01/29/2014.  She completed pulmonary rehab program in January    She is onOFEV since 12/2017 -was unable to tolerate the dose of 150 due to nausea and diarrhea, to drink twice daily in 05/2018 which she has been able to tolerate much better. She feels like she is doing better, complains about the noise from a portable concentrator and would like to change to inogen  Denies cough, shortness of breath is stable. She is considering changing to a assisted living situation and wants to discuss my input      Significant tests/ events reviewed 10/2012 >>ARDS Requiring mechanical ventilation  PFTs 01/2013 - no obstruction, mild restriction, FVC 77%, DLCO decreased at 57%  PFTs 06/2017 show moderate restriction with ratio 93, FVC 56%, FEV1 70%, TLC 44% with DLCO 43%  03/2018 FVC 53%  PET 05/2017 subcarinal LN SUV 4.2  CT chest from 11/2017 which showed left lower lobe nodule and favoring UIP pattern   Past Medical History:  Diagnosis Date  . Anxiety   . Arthritis    a. 10/2012 s/p Right THA.  Marland Kitchen Arthritis    "hands" (09/21/2017)  . Chronic lower back pain   . Complication of anesthesia    "couldn't wake me up after my hip surgery" (09/21/2017)  .  Depression   . History of ARDS   . Hyperlipidemia   . Hypertension   . Lung cancer ([place]) 02/16/13   LUL lung SQUAMOUS CELL  . Lung cancer ([place]) dx'd 12/2013    LLL ADENOCARCINOMA  . Pulmonary fibrosis ([place]) dx'd ~ 07/2017  . Pulmonary nodule    a. 10/2012 CT: 20mm anterior LUL nodule w/ spiculated appearance - rec PET CT.  . Seasonal allergies    "I take Claritin" (09/21/2017)  . Sinus headache      Review of Systems neg for any significant sore throat, dysphagia, itching, sneezing, nasal congestion or excess/ purulent secretions, fever, chills, sweats, unintended wt loss, pleuritic or exertional cp, hempoptysis, orthopnea pnd or change in chronic leg swelling. Also denies presyncope, palpitations, heartburn, abdominal pain, nausea, vomiting, diarrhea or change in bowel or urinary habits, dysuria,hematuria, rash, arthralgias, visual complaints, headache, numbness weakness or ataxia.     Objective:   Physical Exam   Gen. Pleasant, well-nourished, in no distress ENT - no thrush, no post nasal drip Neck: No JVD, no thyromegaly, no carotid bruits Lungs: no use of accessory muscles, no dullness to percussion, bibasal rales no rhonchi  Cardiovascular: Rhythm regular, heart sounds  normal, no murmurs or gallops, no peripheral edema Musculoskeletal: No deformities, no cyanosis or clubbing         Assessment & Plan:

## 2018-09-02 NOTE — Assessment & Plan Note (Signed)
She will go ahead and request a portable concentrator from inogen 2 to 3 L pulse on exertion

## 2018-09-02 NOTE — Patient Instructions (Signed)
Flu shot today. Increase Ofev - 1 50 once daily and 100 for the second dose Call me back in 1 month to report how this trial is working  Schedule PFTs in 1 month. High-resolution CT chest in January 2020.

## 2018-09-07 DIAGNOSIS — M5416 Radiculopathy, lumbar region: Secondary | ICD-10-CM | POA: Diagnosis not present

## 2018-09-13 DIAGNOSIS — M5416 Radiculopathy, lumbar region: Secondary | ICD-10-CM | POA: Diagnosis not present

## 2018-09-15 DIAGNOSIS — M5416 Radiculopathy, lumbar region: Secondary | ICD-10-CM | POA: Diagnosis not present

## 2018-09-20 DIAGNOSIS — M961 Postlaminectomy syndrome, not elsewhere classified: Secondary | ICD-10-CM | POA: Diagnosis not present

## 2018-10-04 ENCOUNTER — Ambulatory Visit (INDEPENDENT_AMBULATORY_CARE_PROVIDER_SITE_OTHER): Payer: Medicare Other | Admitting: Pulmonary Disease

## 2018-10-04 DIAGNOSIS — J84112 Idiopathic pulmonary fibrosis: Secondary | ICD-10-CM

## 2018-10-04 LAB — PULMONARY FUNCTION TEST
DL/VA % pred: 90 %
DL/VA: 3.97 ml/min/mmHg/L
DLCO UNC: 9.21 ml/min/mmHg
DLCO unc % pred: 45 %
FEF 25-75 POST: 3.53 L/s
FEF 25-75 Pre: 2.57 L/sec
FEF2575-%Change-Post: 37 %
FEF2575-%PRED-POST: 237 %
FEF2575-%PRED-PRE: 172 %
FEV1-%Change-Post: 2 %
FEV1-%PRED-PRE: 70 %
FEV1-%Pred-Post: 71 %
FEV1-POST: 1.32 L
FEV1-PRE: 1.28 L
FEV1FVC-%CHANGE-POST: 0 %
FEV1FVC-%PRED-PRE: 127 %
FEV6-%Change-Post: 2 %
FEV6-%Pred-Post: 59 %
FEV6-%Pred-Pre: 57 %
FEV6-Post: 1.38 L
FEV6-Pre: 1.34 L
FEV6FVC-%PRED-POST: 105 %
FEV6FVC-%Pred-Pre: 105 %
FVC-%Change-Post: 2 %
FVC-%PRED-POST: 56 %
FVC-%Pred-Pre: 54 %
FVC-POST: 1.38 L
FVC-Pre: 1.34 L
PRE FEV1/FVC RATIO: 95 %
Post FEV1/FVC ratio: 95 %
Post FEV6/FVC ratio: 100 %
Pre FEV6/FVC Ratio: 100 %

## 2018-10-04 NOTE — Progress Notes (Signed)
PFT completed today.  

## 2018-10-07 ENCOUNTER — Other Ambulatory Visit: Payer: Self-pay

## 2018-10-07 MED ORDER — NINTEDANIB ESYLATE 150 MG PO CAPS: 150.0000 mg | ORAL_CAPSULE | Freq: Every day | ORAL | 0 refills | Status: DC

## 2018-11-11 ENCOUNTER — Telehealth: Payer: Self-pay | Admitting: Pulmonary Disease

## 2018-11-11 NOTE — Telephone Encounter (Signed)
Called and spoke with patient, she stated that she started the 150 mg of OFEV on 10/17/18 she stated that it has caused a lot of cramps and GI upset. She would like to drop back down to the 100 mg BID.    RA please advise, thank you.

## 2018-11-11 NOTE — Telephone Encounter (Signed)
She wants this reduced back to 100 mg twice daily.

## 2018-11-11 NOTE — Telephone Encounter (Signed)
OK to drop back down to 100 bid dose

## 2018-11-11 NOTE — Telephone Encounter (Signed)
Called and spoke with patient, she is aware and verbalized understanding. Nothing further needed.  

## 2018-11-21 ENCOUNTER — Telehealth: Payer: Self-pay | Admitting: Pulmonary Disease

## 2018-11-21 MED ORDER — NINTEDANIB ESYLATE 100 MG PO CAPS: 100.0000 mg | ORAL_CAPSULE | Freq: Two times a day (BID) | ORAL | 3 refills | Status: DC

## 2018-11-21 NOTE — Telephone Encounter (Signed)
Call made to [facility], aware refill has been sent. Voiced understanding. Nothing further is needed at this time.

## 2018-12-06 DIAGNOSIS — M545 Low back pain: Secondary | ICD-10-CM | POA: Diagnosis not present

## 2018-12-06 DIAGNOSIS — E7800 Pure hypercholesterolemia, unspecified: Secondary | ICD-10-CM | POA: Diagnosis not present

## 2018-12-06 DIAGNOSIS — I10 Essential (primary) hypertension: Secondary | ICD-10-CM | POA: Diagnosis not present

## 2018-12-06 DIAGNOSIS — J8410 Pulmonary fibrosis, unspecified: Secondary | ICD-10-CM | POA: Diagnosis not present

## 2018-12-07 ENCOUNTER — Ambulatory Visit (INDEPENDENT_AMBULATORY_CARE_PROVIDER_SITE_OTHER)
Admission: RE | Admit: 2018-12-07 | Discharge: 2018-12-07 | Disposition: A | Discharge status: Home / Self Care | Payer: Medicare Other | Source: Ambulatory Visit | Attending: Pulmonary Disease | Admitting: Pulmonary Disease

## 2018-12-07 DIAGNOSIS — J84112 Idiopathic pulmonary fibrosis: Secondary | ICD-10-CM

## 2018-12-16 ENCOUNTER — Ambulatory Visit (INDEPENDENT_AMBULATORY_CARE_PROVIDER_SITE_OTHER): Payer: Medicare Other | Admitting: Pulmonary Disease

## 2018-12-16 ENCOUNTER — Encounter: Payer: Self-pay | Admitting: Pulmonary Disease

## 2018-12-16 VITALS — BP 120/70 | HR 94 | Ht 62.0 in | Wt 127.0 lb

## 2018-12-16 DIAGNOSIS — J9611 Chronic respiratory failure with hypoxia: Secondary | ICD-10-CM | POA: Diagnosis not present

## 2018-12-16 DIAGNOSIS — J84112 Idiopathic pulmonary fibrosis: Secondary | ICD-10-CM

## 2018-12-16 DIAGNOSIS — Z5181 Encounter for therapeutic drug level monitoring: Secondary | ICD-10-CM

## 2018-12-16 LAB — HEPATIC FUNCTION PANEL
ALT: 13 U/L (ref 0–35)
AST: 23 U/L (ref 0–37)
Albumin: 4.1 g/dL (ref 3.5–5.2)
Alkaline Phosphatase: 59 U/L (ref 39–117)
Bilirubin, Direct: 0.1 mg/dL (ref 0.0–0.3)
Total Bilirubin: 0.6 mg/dL (ref 0.2–1.2)
Total Protein: 6.5 g/dL (ref 6.0–8.3)

## 2018-12-16 NOTE — Progress Notes (Signed)
Subjective:    Patient ID: [name] [name], female    DOB: [birth year], 78 y.o.   MRN: [id number]  HPI  78yo ex-smoker With a history of lung cancer for FUofIPF She was able to quit smoking in 2013,more than 40 pack years.  She had an acute illness in 2013 with interstitial pneumonia/ARDS requiring mechanical ventilation.Infiltrates seem to improve with diuresis and steroids and she was able to come off oxygen. Workup at that time included low positive RA factor withnegative ANA, CCP, hypersensitivity panel She underwent SBRT on 2 separate occasions for stage IA lung cancer Left upper lobe squamous cell carcinoma status post SBRT completed 03/08/2013. Left lower lobe adenocarcinoma status post SBRT completed 01/29/2014.   Chief Complaint  Patient presents with  . Follow-up    CT f/u. Breathing has been ok.       Sheis onOFEV since Jan 21, 2018 - deceased to 100 bid in 2018/06/20 Last OV 08/2018  -we will try to increase Ofev again to 150 twice a day but she developed a lot of cramps and GI upset.  Has backed down to 100 twice daily. Tolerating this better she does get some loose stools in the morning but takes Imodium only if she has to go outside.  No such problems in the evening dose.  Breathing is stable.  She uses the portable concentrator and is compliant.  We reviewed HRCT and PFTs again today which shows stable lung function  Significant tests/ events reviewed  10/2012 >>ARDS Requiring mechanical ventilation  PFTs 01/2013 - no obstruction, mild restriction, FVC 77%, DLCO decreased at 57%  PFTs 06/2017 show moderate restriction with ratio 93, FVC 56%, FEV1 70%, TLC 44% with DLCO 43%  03/2018 FVC 53%  09/2018  PFTs Stable compared to a year ago 06/2017 FVC 56%, DLCO 45%  PET 06/20/17 subcarinal LN SUV 4.2  CT chest from 11/2017 which showed left lower lobe nodule and favoring UIP pattern   Past Medical History:  Diagnosis Date  . Anxiety   . Arthritis      a. 10/2012 s/p Right THA.  Marland Kitchen Arthritis    "hands" (09/21/2017)  . Chronic lower back pain   . Complication of anesthesia    "couldn't wake me up after my hip surgery" (09/21/2017)  . Depression   . History of ARDS   . Hyperlipidemia   . Hypertension   . Lung cancer ([place]) 02/16/13   LUL lung SQUAMOUS CELL  . Lung cancer ([place]) dx'd 01/21/2014    LLL ADENOCARCINOMA  . Pulmonary fibrosis ([place]) dx'd ~ 07/2017  . Pulmonary nodule    a. 10/2012 CT: 47mm anterior LUL nodule w/ spiculated appearance - rec PET CT.  . Seasonal allergies    "I take Claritin" (09/21/2017)  . Sinus headache     Review of Systems neg for any significant sore throat, dysphagia, itching, sneezing, nasal congestion or excess/ purulent secretions, fever, chills, sweats, unintended wt loss, pleuritic or exertional cp, hempoptysis, orthopnea pnd or change in chronic leg swelling. Also denies presyncope, palpitations, heartburn, abdominal pain, nausea, vomiting, diarrhea or change in bowel or urinary habits, dysuria,hematuria, rash, arthralgias, visual complaints, headache, numbness weakness or ataxia.     Objective:   Physical Exam   Gen. Pleasant, well-nourished, in no distress ENT - no thrush, no pallor/icterus,no post nasal drip Neck: No JVD, no thyromegaly, no carotid bruits Lungs: no use of accessory muscles, no dullness to percussion, bibasal rales no rhonchi  Cardiovascular: Rhythm regular, heart sounds  normal, no  murmurs or gallops, no peripheral edema Musculoskeletal: No deformities, no cyanosis or clubbing         Assessment & Plan:

## 2018-12-16 NOTE — Addendum Note (Signed)
Addended by: Shirlee More R on: 12/16/2018 12:06 PM   Modules accepted: Orders

## 2018-12-16 NOTE — Addendum Note (Signed)
Addended by: Suzzanne Cloud E on: 12/16/2018 12:09 PM   Modules accepted: Orders

## 2018-12-16 NOTE — Patient Instructions (Signed)
Check liver function tests if not already done by Dr. Alroy Dust and repeat in April Lung function and fibrosis appears stable. Continue on 100 mg of Ofev twice daily

## 2018-12-16 NOTE — Assessment & Plan Note (Signed)
Continue oxygen. 

## 2018-12-16 NOTE — Assessment & Plan Note (Signed)
Check liver function tests if not already done by Dr. Alroy Dust and repeat in April Lung function and fibrosis appears stable.  Lung function dropped from 20 14-20 18 but then seems to have stabilized Continue on 100 mg of Ofev twice daily

## 2019-03-07 ENCOUNTER — Other Ambulatory Visit: Payer: Self-pay | Admitting: Pulmonary Disease

## 2019-03-07 DIAGNOSIS — J84112 Idiopathic pulmonary fibrosis: Secondary | ICD-10-CM

## 2019-06-06 DIAGNOSIS — J8410 Pulmonary fibrosis, unspecified: Secondary | ICD-10-CM | POA: Diagnosis not present

## 2019-06-06 DIAGNOSIS — I10 Essential (primary) hypertension: Secondary | ICD-10-CM | POA: Diagnosis not present

## 2019-06-06 DIAGNOSIS — E7800 Pure hypercholesterolemia, unspecified: Secondary | ICD-10-CM | POA: Diagnosis not present

## 2019-06-06 DIAGNOSIS — M545 Low back pain: Secondary | ICD-10-CM | POA: Diagnosis not present

## 2019-07-10 DIAGNOSIS — M25512 Pain in left shoulder: Secondary | ICD-10-CM | POA: Diagnosis not present

## 2019-07-10 DIAGNOSIS — M6281 Muscle weakness (generalized): Secondary | ICD-10-CM | POA: Diagnosis not present

## 2019-07-10 DIAGNOSIS — R262 Difficulty in walking, not elsewhere classified: Secondary | ICD-10-CM | POA: Diagnosis not present

## 2019-07-10 DIAGNOSIS — M542 Cervicalgia: Secondary | ICD-10-CM | POA: Diagnosis not present

## 2019-07-12 DIAGNOSIS — R262 Difficulty in walking, not elsewhere classified: Secondary | ICD-10-CM | POA: Diagnosis not present

## 2019-07-12 DIAGNOSIS — M25512 Pain in left shoulder: Secondary | ICD-10-CM | POA: Diagnosis not present

## 2019-07-12 DIAGNOSIS — M6281 Muscle weakness (generalized): Secondary | ICD-10-CM | POA: Diagnosis not present

## 2019-07-12 DIAGNOSIS — M542 Cervicalgia: Secondary | ICD-10-CM | POA: Diagnosis not present

## 2019-07-13 DIAGNOSIS — M6281 Muscle weakness (generalized): Secondary | ICD-10-CM | POA: Diagnosis not present

## 2019-07-13 DIAGNOSIS — R262 Difficulty in walking, not elsewhere classified: Secondary | ICD-10-CM | POA: Diagnosis not present

## 2019-07-13 DIAGNOSIS — M25512 Pain in left shoulder: Secondary | ICD-10-CM | POA: Diagnosis not present

## 2019-07-13 DIAGNOSIS — M542 Cervicalgia: Secondary | ICD-10-CM | POA: Diagnosis not present

## 2019-07-17 ENCOUNTER — Other Ambulatory Visit: Payer: Self-pay | Admitting: Pulmonary Disease

## 2019-07-17 DIAGNOSIS — J84112 Idiopathic pulmonary fibrosis: Secondary | ICD-10-CM

## 2019-07-19 ENCOUNTER — Telehealth: Payer: Self-pay | Admitting: Pulmonary Disease

## 2019-07-19 DIAGNOSIS — M6281 Muscle weakness (generalized): Secondary | ICD-10-CM | POA: Diagnosis not present

## 2019-07-19 DIAGNOSIS — M25512 Pain in left shoulder: Secondary | ICD-10-CM | POA: Diagnosis not present

## 2019-07-19 DIAGNOSIS — R262 Difficulty in walking, not elsewhere classified: Secondary | ICD-10-CM | POA: Diagnosis not present

## 2019-07-19 DIAGNOSIS — M542 Cervicalgia: Secondary | ICD-10-CM | POA: Diagnosis not present

## 2019-07-19 NOTE — Telephone Encounter (Signed)
Spoke with patient. She is aware that she does not need a CT scan at this time. She has been scheduled for a follow up visit for 8/27 at 11. She is aware of the address.   Nothing further needed at time of call.

## 2019-07-19 NOTE — Telephone Encounter (Signed)
Does not need follow-up CT But does need follow-up office visit with APP or me

## 2019-07-19 NOTE — Telephone Encounter (Signed)
Spoke with patient. She stated that she was calling to see if she needed to make an appointment for a CT scan. She was under the impression that she needed a CT scan in 6 months. I reviewed her chart and I didn't see where she needed to have one in 6 months, she had one back in January 2020.   RA, please advise. Thanks!

## 2019-07-21 DIAGNOSIS — M6281 Muscle weakness (generalized): Secondary | ICD-10-CM | POA: Diagnosis not present

## 2019-07-21 DIAGNOSIS — R262 Difficulty in walking, not elsewhere classified: Secondary | ICD-10-CM | POA: Diagnosis not present

## 2019-07-21 DIAGNOSIS — R300 Dysuria: Secondary | ICD-10-CM | POA: Diagnosis not present

## 2019-07-21 DIAGNOSIS — M542 Cervicalgia: Secondary | ICD-10-CM | POA: Diagnosis not present

## 2019-07-21 DIAGNOSIS — M25512 Pain in left shoulder: Secondary | ICD-10-CM | POA: Diagnosis not present

## 2019-07-25 DIAGNOSIS — M542 Cervicalgia: Secondary | ICD-10-CM | POA: Diagnosis not present

## 2019-07-25 DIAGNOSIS — M25512 Pain in left shoulder: Secondary | ICD-10-CM | POA: Diagnosis not present

## 2019-07-25 DIAGNOSIS — M6281 Muscle weakness (generalized): Secondary | ICD-10-CM | POA: Diagnosis not present

## 2019-07-25 DIAGNOSIS — R262 Difficulty in walking, not elsewhere classified: Secondary | ICD-10-CM | POA: Diagnosis not present

## 2019-07-27 ENCOUNTER — Other Ambulatory Visit: Payer: Self-pay

## 2019-07-27 ENCOUNTER — Telehealth: Payer: Self-pay | Admitting: Pulmonary Disease

## 2019-07-27 ENCOUNTER — Ambulatory Visit (INDEPENDENT_AMBULATORY_CARE_PROVIDER_SITE_OTHER): Payer: Medicare Other | Admitting: Pulmonary Disease

## 2019-07-27 ENCOUNTER — Encounter: Payer: Self-pay | Admitting: Pulmonary Disease

## 2019-07-27 DIAGNOSIS — J9611 Chronic respiratory failure with hypoxia: Secondary | ICD-10-CM

## 2019-07-27 DIAGNOSIS — R911 Solitary pulmonary nodule: Secondary | ICD-10-CM

## 2019-07-27 DIAGNOSIS — J84112 Idiopathic pulmonary fibrosis: Secondary | ICD-10-CM

## 2019-07-27 DIAGNOSIS — Z23 Encounter for immunization: Secondary | ICD-10-CM | POA: Diagnosis not present

## 2019-07-27 NOTE — Telephone Encounter (Signed)
Called Dr. Virgilio Belling office to have the recent liver function test faxed to [place]. Patient had CMP in July 2020. That is what is going to be faxed. Will route to myself until fax I received.

## 2019-07-27 NOTE — Assessment & Plan Note (Addendum)
Appears stable by symptoms-although has been progressive overall since diagnosis Obtain LFT report from Dr. Alroy Dust  PFTs in 4 months-spirometry plus DLCO  Continue O FEV at current dose of 100 twice daily, she develops diarrhea at higher doses High-dose flu shot

## 2019-07-27 NOTE — Assessment & Plan Note (Signed)
Repeat CT scan in 12/2019

## 2019-07-27 NOTE — Telephone Encounter (Signed)
Patient needs PFT with spiro dlco in 4 months and follow up with RA or TP

## 2019-07-27 NOTE — Patient Instructions (Signed)
Ambulatory saturation on 3 L POC  Increase your activity level daily Obtain LFT report from Dr. Alroy Dust  PFTs in 4 months-spirometry plus DLCO

## 2019-07-27 NOTE — Progress Notes (Signed)
   Subjective:    Patient ID: [name] [name], female    DOB: 10-17-1941, 78 y.o.   MRN: [id number]  HPI  78 yo ex-smoker With a history of lung cancer for FUofIPF She was able to quit smoking in 2013,more than 40 pack years.  She had an acute illness in 2013 with interstitial pneumonia/ARDS requiring mechanical ventilation.Infiltrates seem to improve with diuresis and steroids and she was able to come off oxygen. Workup at that time included low positive RA factor withnegative ANA, CCP, hypersensitivity panel She underwent SBRT on 2 separate occasions for stage IA lung cancer Left upper lobe squamous cell carcinoma status post SBRT completed 03/08/2013. Left lower lobe adenocarcinoma status post SBRT completed 01/29/2014.    Chief Complaint  Patient presents with  . Follow-up    increased shortness of breath when increased exertion    She reports mild increase in dyspnea, wonders if she is getting enough oxygen from her POC and whether she needs inogen  Type.  DME has not been very responsive when she called her about needing new battery.  She uses 2 L at rest and 3 L pulse on ambulation. She admits to being sedentary over the past 4 months due to being quarantined at [employer]. She intends to get more active now  She is tolerating O FEV better at the reduced dose, reports 2-3 loose stools daily and still has to take Imodium at times.  Does not want to increase dose.  She did get her LFTs checked with her PCP and did not get a call back, so presumed normal.  She remains on atorvastatin also  No cough, wheezing or intercurrent illness, no pedal edema  Significant tests/ events reviewed  10/2012 >>ARDS Requiring mechanical ventilation  PFTs 01/2013 - no obstruction, mild restriction, FVC 77%, DLCO decreased at 57%  PFTs 06/2017 show moderate restriction with ratio 93, FVC 56%, FEV1 70%, TLC 44% with DLCO 43%  03/2018 FVC 53%  09/2018  PFTs Stable compared to a year  ago 06/2017 FVC 56%, DLCO 45%  PET 05/2017 subcarinal LN SUV 4.2  CT chest from 11/2017 which showed left lower lobe nodule and favoring UIP pattern HRCT 11/2018 UIP progressed since 2019   Review of Systems neg for any significant sore throat, dysphagia, itching, sneezing, nasal congestion or excess/ purulent secretions, fever, chills, sweats, unintended wt loss, pleuritic or exertional cp, hempoptysis, orthopnea pnd or change in chronic leg swelling. Also denies presyncope, palpitations, heartburn, abdominal pain, nausea, vomiting, diarrhea or change in bowel or urinary habits, dysuria,hematuria, rash, arthralgias, visual complaints, headache, numbness weakness or ataxia.     Objective:   Physical Exam   Gen. Pleasant, well-nourished, in no distress ENT - no thrush, no pallor/icterus,no post nasal drip Neck: No JVD, no thyromegaly, no carotid bruits Lungs: no use of accessory muscles, no dullness to percussion, bibasal1/3 rales no rhonchi  Cardiovascular: Rhythm regular, heart sounds  normal, no murmurs or gallops, no peripheral edema Musculoskeletal: No deformities, no cyanosis or clubbing         Assessment & Plan:

## 2019-07-27 NOTE — Assessment & Plan Note (Addendum)
Ambulatory saturation on 3 L POC -saturations stayed good  Increase your activity level daily -feel that pulmonary rehab would be risky for her with covid situation Handicap placard request filled out

## 2019-07-28 NOTE — Telephone Encounter (Signed)
Faxed received, and given to Dr. Elsworth Soho for review. He already has seen and reviewed nothing further needed at this time.

## 2019-07-31 DIAGNOSIS — I517 Cardiomegaly: Secondary | ICD-10-CM | POA: Diagnosis not present

## 2019-08-16 DIAGNOSIS — M542 Cervicalgia: Secondary | ICD-10-CM | POA: Diagnosis not present

## 2019-08-16 DIAGNOSIS — R262 Difficulty in walking, not elsewhere classified: Secondary | ICD-10-CM | POA: Diagnosis not present

## 2019-08-16 DIAGNOSIS — M6281 Muscle weakness (generalized): Secondary | ICD-10-CM | POA: Diagnosis not present

## 2019-08-16 DIAGNOSIS — M25512 Pain in left shoulder: Secondary | ICD-10-CM | POA: Diagnosis not present

## 2019-08-21 NOTE — Telephone Encounter (Signed)
Noted. Patrice aware PFT needs scheduling.  

## 2019-08-31 DIAGNOSIS — N644 Mastodynia: Secondary | ICD-10-CM | POA: Diagnosis not present

## 2019-08-31 DIAGNOSIS — Z85118 Personal history of other malignant neoplasm of bronchus and lung: Secondary | ICD-10-CM | POA: Diagnosis not present

## 2019-09-01 ENCOUNTER — Encounter: Payer: Self-pay | Admitting: *Deleted

## 2019-09-07 NOTE — Telephone Encounter (Signed)
Recalled placed in pt chart for pft and f/u with RA -pr

## 2019-09-28 DIAGNOSIS — M542 Cervicalgia: Secondary | ICD-10-CM | POA: Diagnosis not present

## 2019-10-01 ENCOUNTER — Other Ambulatory Visit: Payer: Self-pay | Admitting: Pulmonary Disease

## 2019-10-01 DIAGNOSIS — J84112 Idiopathic pulmonary fibrosis: Secondary | ICD-10-CM

## 2019-10-09 DIAGNOSIS — M5412 Radiculopathy, cervical region: Secondary | ICD-10-CM | POA: Diagnosis not present

## 2019-10-09 DIAGNOSIS — M542 Cervicalgia: Secondary | ICD-10-CM | POA: Diagnosis not present

## 2019-10-11 ENCOUNTER — Telehealth: Payer: Self-pay | Admitting: Pulmonary Disease

## 2019-10-11 DIAGNOSIS — J84112 Idiopathic pulmonary fibrosis: Secondary | ICD-10-CM

## 2019-10-11 NOTE — Telephone Encounter (Signed)
Spoke with patient. She stated that the POC she received from [employer] years ago is no longer working and they do not have anymore available. She did some research and saw that [employer] makes POCs. She requested [employer] to send a RX to our office for RA to sign. Advised her that I have checked RA's inbox and did not see a RX. She stated that [employer] stated that they would send a RX to TP as well in case RA was out of the office. Checked TP's inbox and did not see form.   Will have to call [employer] and request to speak with Jill Alexanders tomorrow to get the form refaxed to our office.

## 2019-10-12 NOTE — Telephone Encounter (Signed)
Called and scheduled patient for pft and f/u with TP on 12/15/2019 - pt also mentioned having a scan done -pr

## 2019-10-13 NOTE — Telephone Encounter (Signed)
[facility] in jan 2021 Pl schedule FU with me rather than TP - whenever my schedule opens up

## 2019-10-13 NOTE — Telephone Encounter (Signed)
Dr Elsworth Soho, does the pt need a CT? If so what type of scan, thanks!

## 2019-10-13 NOTE — Telephone Encounter (Signed)
Pt needs new CT order placed.  This is one that was scheduled at [facility].

## 2019-10-13 NOTE — Telephone Encounter (Signed)
HRCT ord

## 2019-10-16 NOTE — Telephone Encounter (Signed)
Call made to [employer], made aware we have not received any paper work as of yet and we need it re-faxed. She states they already have the prescription on file. She states the POC has already shipped to the patient. It is to be delivered tomorrow and the patient is aware.   Nothing further needed at this time.

## 2019-10-31 DIAGNOSIS — M542 Cervicalgia: Secondary | ICD-10-CM | POA: Diagnosis not present

## 2019-11-06 ENCOUNTER — Telehealth: Payer: Self-pay | Admitting: Pulmonary Disease

## 2019-11-06 DIAGNOSIS — R911 Solitary pulmonary nodule: Secondary | ICD-10-CM

## 2019-11-06 DIAGNOSIS — M542 Cervicalgia: Secondary | ICD-10-CM | POA: Diagnosis not present

## 2019-11-06 DIAGNOSIS — J84112 Idiopathic pulmonary fibrosis: Secondary | ICD-10-CM

## 2019-11-07 NOTE — Telephone Encounter (Signed)
Dr. Tonita Cong is trying to reach Dr. Elsworth Soho.  [phone].

## 2019-11-07 NOTE — Telephone Encounter (Signed)
Dr. Elsworth Soho please call Dr. Tonita Cong back about mutual patient.  Thanks!

## 2019-11-08 ENCOUNTER — Other Ambulatory Visit: Payer: Self-pay | Admitting: *Deleted

## 2019-11-08 DIAGNOSIS — R911 Solitary pulmonary nodule: Secondary | ICD-10-CM

## 2019-11-08 NOTE — Telephone Encounter (Signed)
Called and spoke with pt letting her know the info stated by Dr. Elsworth Soho.  Asked pt if she would be okay for [place] to order PET and she stated that would be fine. Order placed for the PET.   Nothing further needed.

## 2019-11-08 NOTE — Telephone Encounter (Signed)
I have tried multiple times to reach their office but keep getting voice message Please have them provide me a direct number to reach Dr Tonita Cong Or he can reach me at [phone]

## 2019-11-08 NOTE — Telephone Encounter (Signed)
Dr. Reather Littler personal number is [phone]

## 2019-11-08 NOTE — Telephone Encounter (Signed)
I discussed her case with Dr. Maxie Better She had cervical spine degenerative disease and underwent MRI-which showed diffuse bone marrow changes and they could not rule out metastatic disease. Please let patient know that I think metastasis would be quite unlikely given that she had a good CT scan in January 2020.  But the only way to rule this out would be to do a PET scan (she has had one before in 2018 )  If she is agreeable, we will order PET scan and forward results to Dr. Tonita Cong

## 2019-11-16 ENCOUNTER — Other Ambulatory Visit: Payer: Self-pay

## 2019-11-16 ENCOUNTER — Other Ambulatory Visit (HOSPITAL_COMMUNITY): Payer: Self-pay

## 2019-11-16 ENCOUNTER — Encounter (HOSPITAL_COMMUNITY)
Admission: RE | Admit: 2019-11-16 | Discharge: 2019-11-16 | Disposition: A | Discharge status: Home / Self Care | Payer: Medicare Other | Source: Ambulatory Visit | Attending: Pulmonary Disease | Admitting: Pulmonary Disease

## 2019-11-16 ENCOUNTER — Telehealth: Payer: Self-pay | Admitting: Internal Medicine

## 2019-11-16 ENCOUNTER — Inpatient Hospital Stay (HOSPITAL_COMMUNITY)
Admission: EM | Admit: 2019-11-16 | Discharge: 2019-11-21 | DRG: 189 | Disposition: A | Discharge status: Home / Self Care | Payer: Medicare Other | Attending: Internal Medicine | Admitting: Internal Medicine

## 2019-11-16 ENCOUNTER — Telehealth: Payer: Self-pay | Admitting: Adult Health

## 2019-11-16 DIAGNOSIS — J90 Pleural effusion, not elsewhere classified: Secondary | ICD-10-CM

## 2019-11-16 DIAGNOSIS — C3432 Malignant neoplasm of lower lobe, left bronchus or lung: Secondary | ICD-10-CM | POA: Diagnosis present

## 2019-11-16 DIAGNOSIS — R590 Localized enlarged lymph nodes: Secondary | ICD-10-CM

## 2019-11-16 DIAGNOSIS — J939 Pneumothorax, unspecified: Secondary | ICD-10-CM

## 2019-11-16 DIAGNOSIS — I5031 Acute diastolic (congestive) heart failure: Secondary | ICD-10-CM | POA: Diagnosis present

## 2019-11-16 DIAGNOSIS — M545 Low back pain: Secondary | ICD-10-CM | POA: Diagnosis present

## 2019-11-16 DIAGNOSIS — F17210 Nicotine dependence, cigarettes, uncomplicated: Secondary | ICD-10-CM | POA: Diagnosis present

## 2019-11-16 DIAGNOSIS — I4510 Unspecified right bundle-branch block: Secondary | ICD-10-CM | POA: Diagnosis present

## 2019-11-16 DIAGNOSIS — Z96641 Presence of right artificial hip joint: Secondary | ICD-10-CM | POA: Diagnosis present

## 2019-11-16 DIAGNOSIS — Z880 Allergy status to penicillin: Secondary | ICD-10-CM | POA: Diagnosis not present

## 2019-11-16 DIAGNOSIS — J84112 Idiopathic pulmonary fibrosis: Secondary | ICD-10-CM | POA: Diagnosis present

## 2019-11-16 DIAGNOSIS — I110 Hypertensive heart disease with heart failure: Secondary | ICD-10-CM | POA: Diagnosis present

## 2019-11-16 DIAGNOSIS — J948 Other specified pleural conditions: Secondary | ICD-10-CM | POA: Diagnosis not present

## 2019-11-16 DIAGNOSIS — C7951 Secondary malignant neoplasm of bone: Secondary | ICD-10-CM | POA: Diagnosis present

## 2019-11-16 DIAGNOSIS — Z20828 Contact with and (suspected) exposure to other viral communicable diseases: Secondary | ICD-10-CM | POA: Diagnosis present

## 2019-11-16 DIAGNOSIS — R0602 Shortness of breath: Secondary | ICD-10-CM | POA: Diagnosis present

## 2019-11-16 DIAGNOSIS — C3412 Malignant neoplasm of upper lobe, left bronchus or lung: Secondary | ICD-10-CM | POA: Diagnosis present

## 2019-11-16 DIAGNOSIS — J910 Malignant pleural effusion: Secondary | ICD-10-CM | POA: Diagnosis not present

## 2019-11-16 DIAGNOSIS — R0600 Dyspnea, unspecified: Secondary | ICD-10-CM | POA: Diagnosis present

## 2019-11-16 DIAGNOSIS — J9611 Chronic respiratory failure with hypoxia: Secondary | ICD-10-CM | POA: Diagnosis not present

## 2019-11-16 DIAGNOSIS — I4892 Unspecified atrial flutter: Secondary | ICD-10-CM | POA: Diagnosis present

## 2019-11-16 DIAGNOSIS — K5903 Drug induced constipation: Secondary | ICD-10-CM | POA: Diagnosis present

## 2019-11-16 DIAGNOSIS — E876 Hypokalemia: Secondary | ICD-10-CM | POA: Diagnosis not present

## 2019-11-16 DIAGNOSIS — C3490 Malignant neoplasm of unspecified part of unspecified bronchus or lung: Secondary | ICD-10-CM

## 2019-11-16 DIAGNOSIS — J9383 Other pneumothorax: Secondary | ICD-10-CM | POA: Diagnosis not present

## 2019-11-16 DIAGNOSIS — Z8249 Family history of ischemic heart disease and other diseases of the circulatory system: Secondary | ICD-10-CM | POA: Diagnosis not present

## 2019-11-16 DIAGNOSIS — J8410 Pulmonary fibrosis, unspecified: Secondary | ICD-10-CM | POA: Diagnosis present

## 2019-11-16 DIAGNOSIS — E785 Hyperlipidemia, unspecified: Secondary | ICD-10-CM | POA: Diagnosis present

## 2019-11-16 DIAGNOSIS — I10 Essential (primary) hypertension: Secondary | ICD-10-CM | POA: Diagnosis present

## 2019-11-16 DIAGNOSIS — J9621 Acute and chronic respiratory failure with hypoxia: Secondary | ICD-10-CM | POA: Diagnosis present

## 2019-11-16 DIAGNOSIS — G8929 Other chronic pain: Secondary | ICD-10-CM | POA: Diagnosis present

## 2019-11-16 DIAGNOSIS — R911 Solitary pulmonary nodule: Secondary | ICD-10-CM | POA: Insufficient documentation

## 2019-11-16 DIAGNOSIS — E871 Hypo-osmolality and hyponatremia: Secondary | ICD-10-CM | POA: Diagnosis present

## 2019-11-16 DIAGNOSIS — S2242XA Multiple fractures of ribs, left side, initial encounter for closed fracture: Secondary | ICD-10-CM | POA: Diagnosis present

## 2019-11-16 LAB — BASIC METABOLIC PANEL
Anion gap: 14 (ref 5–15)
BUN: 10 mg/dL (ref 8–23)
CO2: 30 mmol/L (ref 22–32)
Calcium: 9.4 mg/dL (ref 8.9–10.3)
Chloride: 76 mmol/L — ABNORMAL LOW (ref 98–111)
Creatinine, Ser: 0.59 mg/dL (ref 0.44–1.00)
GFR calc Af Amer: >60 mL/min (ref >60)
GFR calc non Af Amer: >60 mL/min (ref >60)
Glucose, Bld: 118 mg/dL — ABNORMAL HIGH (ref 70–99)
Potassium: 3.6 mmol/L (ref 3.5–5.1)
Sodium: 120 mmol/L — ABNORMAL LOW (ref 135–145)

## 2019-11-16 LAB — SODIUM, URINE, RANDOM: Sodium, Ur: 26 mmol/L

## 2019-11-16 LAB — CBC
HCT: 35.3 % — ABNORMAL LOW (ref 36.0–46.0)
Hemoglobin: 12.5 g/dL (ref 12.0–15.0)
MCH: 31.7 pg (ref 26.0–34.0)
MCHC: 35.4 g/dL (ref 30.0–36.0)
MCV: 89.6 fL (ref 80.0–100.0)
Platelets: 477 10*3/uL — ABNORMAL HIGH (ref 150–400)
RBC: 3.94 MIL/uL (ref 3.87–5.11)
RDW: 12.1 % (ref 11.5–15.5)
WBC: 11.9 10*3/uL — ABNORMAL HIGH (ref 4.0–10.5)
nRBC: 0 % (ref 0.0–0.2)

## 2019-11-16 LAB — POC SARS CORONAVIRUS 2 AG -  ED: SARS Coronavirus 2 Ag: NEGATIVE

## 2019-11-16 LAB — OSMOLALITY, URINE: Osmolality, Ur: 191 mOsm/kg — ABNORMAL LOW (ref 300–900)

## 2019-11-16 LAB — GLUCOSE, CAPILLARY: Glucose-Capillary: 128 mg/dL — ABNORMAL HIGH (ref 70–99)

## 2019-11-16 MED ORDER — ACETAMINOPHEN 650 MG RE SUPP: 650.0000 mg | Freq: Four times a day (QID) | RECTAL | Status: DC | PRN

## 2019-11-16 MED ORDER — FLUDEOXYGLUCOSE F - 18 (FDG) INJECTION
5.7000 | Freq: Once | INTRAVENOUS | Status: AC | PRN
  Administered 2019-11-16: 12:00:00 5.7 via INTRAVENOUS

## 2019-11-16 MED ORDER — SODIUM CHLORIDE 0.9 % IV SOLN: INTRAVENOUS | Status: DC

## 2019-11-16 MED ORDER — ACETAMINOPHEN 325 MG PO TABS
650.0000 mg | ORAL_TABLET | Freq: Four times a day (QID) | ORAL | Status: DC | PRN
  Administered 2019-11-17 – 2019-11-19 (×6): 650 mg via ORAL
  Filled 2019-11-16 (×7): qty 2

## 2019-11-16 MED ORDER — ENOXAPARIN SODIUM 40 MG/0.4ML ~~LOC~~ SOLN
40.0000 mg | SUBCUTANEOUS | Status: DC
  Administered 2019-11-17 – 2019-11-21 (×5): 40 mg via SUBCUTANEOUS
  Filled 2019-11-16 (×5): qty 0.4

## 2019-11-16 NOTE — ED Triage Notes (Signed)
Pt sent here by her provider for a pneumothorax and broken ribs. Usually on 2lpm [place] but currently on 4lpm.

## 2019-11-16 NOTE — ED Notes (Signed)
[place] pts son, please update

## 2019-11-16 NOTE — Telephone Encounter (Signed)
Discussed PET scan results with radiologist -Rib fracture was old, but pleural effusion hydropneumothorax is new -Area of hypermetabolism/consolidation in the left lower lobe could be infectious/inflammatory -Bone lesions are suspicious for metastatic cancer  Discussed PET scan results with patient and explained her that concern could be of infection or recurrent cancer -She denies recent fall  Advised her to go to ER for admission -would suggest admission under hospitalist and pulmonary can consult in a.m.  Would plan for ultrasound-guided thoracentesis in a.m. by IR and depending on results, can also consider supraclavicular lymph node biopsy

## 2019-11-16 NOTE — ED Provider Notes (Signed)
[facility] EMERGENCY DEPARTMENT Provider Note   CSN: [id number] Arrival date & time: 11/16/19  1924     History Chief Complaint  Patient presents with  . Shortness of Breath    [name] [name] is a 78 y.o. female.  HPI [name] [name] is a 78 y.o. female with a medical history of  hypertension, hyperlipidemia, ,  former smoker w h/o lung cancer stage 1A left upper lung (squamous cell carcinoma) 2014, Left lower lung (adenocarcinoma) 2015 , s/p SBRT on 2 occasions completed 2014, h/o ILD  who presents to the ED by recommendation of pulmonologist for new hydropneumothorax. Today she had a pet/ct scan to evaluate her chest for malignancy. She has findings of her left chest suspicious for oncologic process. She reports symptoms of one month duration, gradually worsening with dyspnea, left sided chest wall discomfort, and mild weight loss. She denies fever, chills, vomiting, abdominal pain.       Past Medical History:  Diagnosis Date  . Anxiety   . Arthritis    a. 10/2012 s/p Right THA.  Marland Kitchen Arthritis    "hands" (09/21/2017)  . Chronic lower back pain   . Complication of anesthesia    "couldn't wake me up after my hip surgery" (09/21/2017)  . Depression   . History of ARDS   . Hyperlipidemia   . Hypertension   . Lung cancer ([place]) 02/16/13   LUL lung SQUAMOUS CELL  . Lung cancer ([place]) dx'd 12/2013    LLL ADENOCARCINOMA  . Pulmonary fibrosis ([place]) dx'd ~ 07/2017  . Pulmonary nodule    a. 10/2012 CT: 55mm anterior LUL nodule w/ spiculated appearance - rec PET CT.  . Seasonal allergies    "I take Claritin" (09/21/2017)  . Sinus headache     Patient Active Problem List   Diagnosis Date Noted  . Hydropneumothorax 11/17/2019  . Dyspnea 11/16/2019  . Chronic respiratory failure with hypoxia ([place]) 11/05/2017  . Idiopathic pulmonary fibrosis ([place]) 09/21/2017  . Hypoxia   . Mediastinal lymphadenopathy 06/29/2017  . 24.8 mm well differentiated adenocarcinoma of  the left lower lobe of the lung - clinical stage IA 12/28/2013  . 18 mm squamous cell carcinoma of the left upper lobe of the lung - clinical stage IA 02/16/2013  . Pedal edema 01/02/2013  . Hoarseness, persistent 01/02/2013  . Hypertension 12/02/2012  . Pulmonary nodule 12/02/2012  . Pulmonary fibrosis ([place]) 12/01/2012  . Normocytic anemia 11/07/2012  . S/P total hip arthroplasty 11/06/2012  . Degenerative arthritis of hip 11/03/2012    Past Surgical History:  Procedure Laterality Date  . BACK SURGERY    . CATARACT EXTRACTION W/ INTRAOCULAR LENS  IMPLANT, BILATERAL Bilateral   . EYE SURGERY Right ~ 1949   "removed a growth"  . IR THORACENTESIS ASP PLEURAL SPACE W/IMG GUIDE  11/17/2019  . JOINT REPLACEMENT    . LUMBAR [place] SURGERY  08/2016   "realigned discs; done in [place]; @ [facility]"  . LUNG BIOPSY Left 02/16/13   LUL =non small cell ca with assoc/necrosis  . TONSILLECTOMY    . TOTAL HIP ARTHROPLASTY  11/03/2012   Procedure: TOTAL HIP ARTHROPLASTY ANTERIOR APPROACH;  Surgeon: Mcarthur Rossetti, MD;  Location: [facility] ORS;  Service: Orthopedics;  Laterality: Right;  Right Total Hip Arthroplasty, Anterior Approach (C-Arm)  . TUBAL LIGATION       OB History   No obstetric history on file.     Family History  Problem Relation Age of Onset  . CAD Father   .  Lung disease Neg Hx   . Rheumatologic disease Neg Hx     Social History   Tobacco Use  . Smoking status: Former Smoker    Packs/Bryonna Sundby: 1.00    Years: 40.00    Pack years: 40.00    Types: Cigarettes    Quit date: 11/03/2012    Years since quitting: 7.0  . Smokeless tobacco: Never Used  Substance Use Topics  . Alcohol use: Yes    Alcohol/week: 17.0 standard drinks    Types: 17 Shots of liquor per week    Comment: 09/21/2017 "2-3 shots of liquor /night"  . Drug use: No    Home Medications Prior to Admission medications   Medication Sig Start Date End Date Taking? Authorizing Provider  acetaminophen (TYLENOL)  500 MG tablet Take 500-1,000 mg by mouth every 6 (six) hours as needed for moderate pain.    Yes [provider]  amLODipine (NORVASC) 10 MG tablet Take 1 tablet (10 mg total) by mouth daily. 06/29/17  Yes Rigoberto Noel, MD  atorvastatin (LIPITOR) 40 MG tablet Take 20 mg by mouth every morning.   Yes [provider]  celecoxib (CELEBREX) 100 MG capsule Take 100 mg by mouth 2 (two) times daily. 10/28/19  Yes [provider]  hydrochlorothiazide (HYDRODIURIL) 25 MG tablet Take 25 mg by mouth daily before breakfast.    Yes [provider]  Hypromellose (ARTIFICIAL TEARS OP) Place 1 drop into both eyes daily.   Yes [provider]  loperamide (IMODIUM) 2 MG capsule Take 2 mg by mouth as needed for diarrhea or loose stools.   Yes [provider]  loratadine (CLARITIN) 10 MG tablet Take 10 mg by mouth daily.   Yes [provider]  OFEV 100 MG CAPS TAKE 1 CAPSULE BY MOUTH TWICE A Geoffrey Mankin Patient taking differently: Take 100 mg by mouth 2 (two) times daily.  10/05/19  Yes Rigoberto Noel, MD    Allergies    Penicillins  Review of Systems   Review of Systems  Constitutional: Positive for fatigue and unexpected weight change. Negative for chills and fever.  HENT: Negative for ear pain and sore throat.   Eyes: Negative for pain and visual disturbance.  Respiratory: Positive for shortness of breath. Negative for cough.   Cardiovascular: Positive for chest pain (Left sided chest wall pain). Negative for palpitations.  Gastrointestinal: Negative for abdominal pain and vomiting.  Genitourinary: Negative for dysuria and hematuria.  Musculoskeletal: Negative for arthralgias and back pain.  Skin: Negative for color change and rash.  Neurological: Negative for seizures and syncope.  All other systems reviewed and are negative.   Physical Exam Updated Vital Signs BP (!) 148/87   Pulse 90   Temp 98.6 F (37 C)   Resp 19   Ht 5\' 2"  (1.575 m)    Wt 55 kg   SpO2 96%   BMI 22.18 kg/m   Physical Exam Vitals and nursing note reviewed.  Constitutional:      General: She is in acute distress (mild acute distress).     Appearance: Normal appearance. She is well-developed. She is not ill-appearing.  HENT:     Head: Normocephalic and atraumatic.     Right Ear: External ear normal.     Left Ear: External ear normal.     Nose: Nose normal. No rhinorrhea.     Mouth/Throat:     Mouth: Mucous membranes are moist.  Eyes:     General:  Right eye: No discharge.        Left eye: No discharge.     Conjunctiva/sclera: Conjunctivae normal.  Cardiovascular:     Rate and Rhythm: Normal rate and regular rhythm.     Pulses: Normal pulses.     Heart sounds: Normal heart sounds. No murmur.  Pulmonary:     Effort: Pulmonary effort is normal. No respiratory distress.     Breath sounds: Normal breath sounds. No wheezing or rales.  Chest:     Chest wall: Tenderness (Mild left chest wall tenderness to palpation) present.  Abdominal:     General: Abdomen is flat. There is no distension.     Palpations: Abdomen is soft.     Tenderness: There is no abdominal tenderness.  Musculoskeletal:        General: No deformity or signs of injury. Normal range of motion.     Cervical back: Normal range of motion.  Skin:    General: Skin is warm and dry.     Capillary Refill: Capillary refill takes less than 2 seconds.     Coloration: Skin is not jaundiced.  Neurological:     General: No focal deficit present.     Mental Status: She is alert. Mental status is at baseline.  Psychiatric:        Mood and Affect: Mood normal.        Behavior: Behavior normal.     ED Results / Procedures / Treatments   Labs (all labs ordered are listed, but only abnormal results are displayed) Labs Reviewed  BASIC METABOLIC PANEL - Abnormal; Notable for the following components:      Result Value   Sodium 120 (*)    Chloride 76 (*)    Glucose, Bld 118 (*)     All other components within normal limits  CBC - Abnormal; Notable for the following components:   WBC 11.9 (*)    HCT 35.3 (*)    Platelets 477 (*)    All other components within normal limits  COMPREHENSIVE METABOLIC PANEL - Abnormal; Notable for the following components:   Sodium 124 (*)    Chloride 80 (*)    Total Protein 5.8 (*)    Albumin 2.9 (*)    All other components within normal limits  CBC - Abnormal; Notable for the following components:   WBC 12.2 (*)    Platelets 437 (*)    All other components within normal limits  OSMOLALITY - Abnormal; Notable for the following components:   Osmolality 262 (*)    All other components within normal limits  OSMOLALITY, URINE - Abnormal; Notable for the following components:   Osmolality, Ur 191 (*)    All other components within normal limits  LACTATE DEHYDROGENASE, PLEURAL OR PERITONEAL FLUID - Abnormal; Notable for the following components:   LD, Fluid 142 (*)    All other components within normal limits  BODY FLUID CELL COUNT WITH DIFFERENTIAL - Abnormal; Notable for the following components:   Color, Fluid RED (*)    Appearance, Fluid TURBID (*)    Monocyte-Macrophage-Serous Fluid 13 (*)    All other components within normal limits  TROPONIN I (HIGH SENSITIVITY) - Abnormal; Notable for the following components:   Troponin I (High Sensitivity) 22 (*)    All other components within normal limits  SARS CORONAVIRUS 2 (TAT 6-24 HRS)  BODY FLUID CULTURE  MRSA PCR SCREENING  PROTIME-INR  TSH  CORTISOL  SODIUM, URINE, RANDOM  PROTEIN, PLEURAL OR  PERITONEAL FLUID  GLUCOSE, PLEURAL OR PERITONEAL FLUID  PH, BODY FLUID  CBC  BASIC METABOLIC PANEL  POC SARS CORONAVIRUS 2 AG -  ED  CYTOLOGY - NON PAP  TROPONIN I (HIGH SENSITIVITY)    EKG EKG Interpretation  Date/Time:  Thursday November 16 2019 20:21:21 EST Ventricular Rate:  101 PR Interval:    QRS Duration: 129 QT Interval:  357 QTC Calculation: 463 R Axis:   8 Text  Interpretation: Atrial flutter with predominant 3:1 AV block Right bundle branch block Nonspecific T abnormalities, lateral leads Baseline wander in lead(s) II aVR V2 Confirmed by Fredia Sorrow [phone]) on 11/17/2019 1:27:57 PM   Radiology DG Chest 1 View  Result Date: 11/17/2019 CLINICAL DATA:  Status post thoracentesis on the left EXAM: CHEST  1 VIEW COMPARISON:  November 17, 2019 radiograph obtained earlier in the [facility]; PET-CT November 16, 2019 FINDINGS: There is a stable small left apical pneumothorax without tension component. Left pleural effusion is smaller following thoracentesis. There is underlying airspace consolidation in the left upper and mid lung regions. There is underlying fibrosis. No new opacity is evident. Heart is upper normal in size with pulmonary vascularity within normal limits. There is aortic atherosclerosis. No appreciable adenopathy evident by radiography. Known bony metastases are not well seen by radiography. IMPRESSION: Stable left apical pneumothorax without appreciable tension component. Left pleural effusion smaller following thoracentesis. Airspace opacity in the left upper to mid lung region remains. Underlying fibrosis. No new opacity. Stable cardiac silhouette. Aortic Atherosclerosis (ICD10-I70.0). Electronically Signed   By: Lowella Grip III M.D.   On: 11/17/2019 09:22   MR THORACIC SPINE W WO CONTRAST  Result Date: 11/17/2019 CLINICAL DATA:  Lung cancer. Abnormal uptake into the thoracic spine on the PET scan. EXAM: MRI THORACIC WITHOUT AND WITH CONTRAST TECHNIQUE: Multiplanar and multiecho pulse sequences of the thoracic spine were obtained without and with intravenous contrast. CONTRAST:  5.36mL GADAVIST GADOBUTROL 1 MMOL/ML IV SOLN COMPARISON:  PET scan 11/16/2019. CT of the chest 12/06/2017 and 12/07/2018 FINDINGS: MRI THORACIC SPINE FINDINGS Alignment: Slight degenerative retrolisthesis is present at T11-12 and T12-L1. No other significant listhesis is  present. Levoconvex curvature is centered at T7-8. Vertebrae: Abnormal signal and enhancement is present at T3, T4, T5, and T6, consistent with metastatic disease to the spine. Enhancement occupies the posterior 2/3 of the vertebral body at T3 without significant extension in the pedicles. There is diffuse enhancement in the posterior 1/3 of the vertebral body at T4 without abnormal signal and enhancement extending into the left pedicle. More heterogeneous posterior enhancement is present at T5 and T6 with abnormal signal and enhancement extending into the left pedicle of both levels. No pathologic fractures are present. No definite extraosseous tumor or enhancement is present. No other focal areas of enhancement are present within the thoracic or visualized upper lumbar spine. The scout image demonstrates signal change at C6 and possibly C5, is consistent with additional metastases. There is no definite enhancement in this region. This likely represents more typical degenerative change. Cord:  Normal signal is present in the thoracic spinal cord. Paraspinal and other soft tissues: Bilateral pleural effusions are present. Post obstructive disease is present in the left lung. Bilateral airspace opacities are noted. Complex cysts are again noted in the left kidney. Upper abdomen is otherwise unremarkable. Disc levels: T1-2: A broad-based disc protrusion is asymmetric to the right. Moderate right and mild left foraminal narrowing is present. T2-3: Broad-based disc bulging and facet disease contributes to mild foraminal  narrowing bilaterally, right greater than left. T3-4: Facet spurring contributes to mild right foraminal narrowing. T4-5: No significant disc disease. Facet disease is worse on the right. T5-6: No significant disc disease. Facet hypertrophy is worse on the left. T6-7: A prominent right paramedian disc extrusion is present. Disc material extends 9 mm above the inferior endplate of T6. There is contact and  slight distortion of the ventral surface the cord without abnormal signal. T7-8: A shallow central disc protrusion is present. No significant stenosis is present. T8-9: A left paramedian disc protrusion is present. There is effacement of ventral CSF without significant stenosis. T9-10: A far right lateral disc protrusion extends into the foramen with moderate right and mild left foraminal narrowing. T10-11: Facet spurring contributes to mild right foraminal narrowing. T11-12: A broad-based disc protrusion is present without significant stenosis. T12-L1: A broad-based disc protrusion is present. Mild foraminal narrowing is noted bilaterally. IMPRESSION: 1. Abnormal signal and enhancement at T3, T4, T5, and T6 consistent with metastatic disease to the spine. 2. No pathologic fractures are present. 3. Multilevel spondylosis of the lumbar spine as described. 4. Bilateral lung disease and effusions, left greater than right. Please see PET scan from yesterday for further detail. 5. Complex left renal cysts are stable. Electronically Signed   By: San Morelle M.D.   On: 11/17/2019 05:43   NM PET Image Restag (PS) Skull Base To Thigh  Result Date: 11/16/2019 CLINICAL DATA:  Subsequent treatment strategy for lung cancer. EXAM: NUCLEAR MEDICINE PET SKULL BASE TO THIGH TECHNIQUE: 5.7 mCi F-18 FDG was injected intravenously. Full-ring PET imaging was performed from the skull base to thigh after the radiotracer. CT data was obtained and used for attenuation correction and anatomic localization. Fasting blood glucose: 128 mg/dl COMPARISON:  05/24/2017 FINDINGS: Mediastinal blood pool activity: SUV max 2.4 Liver activity: SUV max NA NECK: No hypermetabolic lymph nodes in the neck. Incidental CT findings: none CHEST: Multiple areas of hypermetabolism are identified in the anterior left lung including a 3.3 x 2.1 cm consolidative opacity in the medial left upper lung adjacent to the mediastinum (see image 53/4) with SUV  max = 5.6. Consolidative opacity seen relatively diffusely in the left parahilar lung shows hypermetabolic FDG uptake with SUV max = 5.6. Focal uptake in the inferior left hilum demonstrates SUV max = 5.1. Focal uptake in the region of the right atrium noted without underlying lesion evident on this noncontrast CT. There is some soft tissue attenuation anterior to the ascending aorta that stable since prior study. Bandlike opacity anterior right upper lobe (image 52/series 4) shows low level hypermetabolism with SUV max = 3.9. Focal hypermetabolism noted in the right hilum without lymphadenopathy evident by CT. Similar focal hypermetabolism identified inferior right hilum. There is a moderate left pleural fluid collection with gas identified in the left anterior pleural space. Low level diffuse FDG accumulation is identified along the left pleural surface. 9 mm short axis left supraclavicular node (34/4) demonstrates SUV max = 3.9. Focus of hypermetabolism identified in the left T11 paraspinal tissues without underlying mass lesion evident by CT. SUV max = 4.4. Incidental CT findings: There is abdominal aortic atherosclerosis without aneurysm. Acute to subacute displaced fractures noted left lateral fifth rib and posterolateral left sixth rib. Extensive chronic interstitial/fibrotic lung disease noted bilaterally. ABDOMEN/PELVIS: No abnormal hypermetabolic activity within the liver, pancreas, adrenal glands, or spleen. No hypermetabolic lymph nodes in the abdomen or pelvis. Incidental CT findings: There is abdominal aortic atherosclerosis without aneurysm. Layering tiny gallstones  evident. Multiple left renal cysts of varying size and attenuation some with layering calcific debris or hemorrhage. SKELETON: Hypermetabolism is identified in the posterior aspect of the T3 and T4 vertebral bodies although no underlying bony lesion is evident. SUV max = 8.4 at the level of T3. Incidental CT findings: Diffuse degenerative  changes noted in the spine. Acute to subacute left rib fractures as above. Patient is status post right hip replacement. IMPRESSION: 1. Multiple areas of low level hypermetabolism associated with consolidative opacity in the left upper and lower lung with hypermetabolic left supraclavicular lymph node, focal hypermetabolism in the hilar regions and anterior mediastinum. Imaging features could be related to diffuse neoplasm (adenocarcinoma) with metastatic disease. Infectious/inflammatory etiology not excluded. 2. Moderate to large left hydropneumothorax, likely posttraumatic as there are acute to subacute displaced fractures of the left fourth and fifth ribs. 3. Bandlike opacity in the anterior right lung with low level hypermetabolism, indeterminate. 4. Hypermetabolic focus in the left 11 paraspinal tissues without underlying mass lesion evident by CT. 5. Hypermetabolic activity in the posterior T3 and T4 vertebral bodies without associated mass lesion evident by CT. Thoracic spine MRI with and without contrast could be used to further evaluate as clinically warranted. 6. Advanced chronic interstitial lung disease with fibrotic component. 7.  Aortic Atherosclerois (ICD10-170.0) These results will be called to the ordering clinician or representative by the Radiologist Assistant, and communication documented in the PACS or zVision Dashboard. Electronically Signed   By: Misty Stanley M.D.   On: 11/16/2019 15:09   DG CHEST PORT 1 VIEW  Result Date: 11/17/2019 CLINICAL DATA:  History of broken ribs and pneumothorax. Pleural effusion. EXAM: PORTABLE CHEST 1 VIEW COMPARISON:  PET CT 11/16/2019.  Chest x-ray 09/22/2017. FINDINGS: Mediastinum stable. Stable cardiomegaly. Dense left mid lung infiltrate/mass noted. Chronic interstitial prominence noted. Small left pleural effusion noted. Tiny left apical pneumothorax cannot be excluded. This appears decreased in size from prior PET-CT of 11/16/2019. Left posterior fifth  and sixth rib fractures again noted. IMPRESSION: 1. Dense left mid lung field infiltrate/mass noted. Reference is made to recent PET-CT report of 11/16/2019. Chronic interstitial prominence noted. Small left pleural effusion noted. 2. Tiny residual left apical pneumothorax cannot be excluded. This appears decreased in size from prior PET-CT of 11/16/2019. Left posterior fifth and sixth rib fractures again noted. Electronically Signed   By: Marcello Moores  Register   On: 11/17/2019 07:43   ECHOCARDIOGRAM COMPLETE  Result Date: 11/17/2019   ECHOCARDIOGRAM REPORT   Patient Name:   Karigan Mcvay Date of Exam: 11/17/2019 Medical Rec #:  [id number]       Height:       62.0 in Accession #:    [id number]      Weight:       135.0 lb Date of Birth:  1941-07-17       BSA:          1.62 m Patient Age:    44 years        BP:           109/66 mmHg Patient Gender: F               HR:           73 bpm. Exam Location:  Inpatient Procedure: Limited Echo and Limited Color Doppler Indications:    CHF-Acute Diastolic 762.83 / T51.76  History:        Patient has prior history of Echocardiogram examinations, most  recent 11/16/2019. Risk Factors:Hypertension and Dyslipidemia.                 Lung cancer, pulmonary fibrosis.  Sonographer:    Darlina Sicilian RDCS Referring Phys: [place]  1. Left ventricular ejection fraction, by visual estimation, is 60 to 65%. The left ventricle has normal function. Mildly increased left ventricular posterior wall thickness. There is severely increased left ventricular hypertrophy of the basal septum.  2. Global right ventricle has normal systolic function.The right ventricular size is normal. No increase in right ventricular wall thickness.  3. Left atrial size was normal.  4. Right atrial size was normal.  5. The mitral valve is normal in structure. No evidence of mitral valve regurgitation. No evidence of mitral stenosis.  6. The tricuspid valve is normal in structure.  Tricuspid valve regurgitation is trivial.  7. The aortic valve is normal in structure. Aortic valve regurgitation is not visualized. No evidence of aortic valve sclerosis or stenosis.  8. The pulmonic valve was normal in structure. Pulmonic valve regurgitation is not visualized.  9. Normal pulmonary artery systolic pressure. 10. The inferior vena cava is normal in size with greater than 50% respiratory variability, suggesting right atrial pressure of 3 mmHg. 11. The left ventricle has no regional wall motion abnormalities. 12. Left ventricular diastolic function could not be evaluated. FINDINGS  Left Ventricle: Left ventricular ejection fraction, by visual estimation, is 60 to 65%. The left ventricle has normal function. The left ventricle has no regional wall motion abnormalities. Mildly increased left ventricular posterior wall thickness. There is severely increased left ventricular hypertrophy. Left ventricular diastolic function could not be evaluated. Indeterminate filling pressures. Right Ventricle: The right ventricular size is normal. No increase in right ventricular wall thickness. Global RV systolic function is has normal systolic function. The tricuspid regurgitant velocity is 2.50 m/s, and with an assumed right atrial pressure  of 3 mmHg, the estimated right ventricular systolic pressure is normal at 28.0 mmHg. Left Atrium: Left atrial size was normal in size. Right Atrium: Right atrial size was normal in size Pericardium: There is no evidence of pericardial effusion. Mitral Valve: The mitral valve is normal in structure. No evidence of mitral valve regurgitation. No evidence of mitral valve stenosis by observation. Tricuspid Valve: The tricuspid valve is normal in structure. Tricuspid valve regurgitation is trivial. Aortic Valve: The aortic valve is normal in structure. Aortic valve regurgitation is not visualized. The aortic valve is structurally normal, with no evidence of sclerosis or stenosis.  Pulmonic Valve: The pulmonic valve was normal in structure. Pulmonic valve regurgitation is not visualized. Pulmonic regurgitation is not visualized. Aorta: The aortic root, ascending aorta and aortic arch are all structurally normal, with no evidence of dilitation or obstruction. Venous: The inferior vena cava is normal in size with greater than 50% respiratory variability, suggesting right atrial pressure of 3 mmHg. IAS/Shunts: No atrial level shunt detected by color flow Doppler. There is no evidence of a patent foramen ovale. No ventricular septal defect is seen or detected. There is no evidence of an atrial septal defect.  LEFT VENTRICLE PLAX 2D LVIDd:         2.90 cm LVIDs:         1.70 cm LV PW:         1.40 cm LV IVS:        1.70 cm LVOT diam:     1.70 cm LV SV:         24  ml LV SV Index:   14.45 LVOT Area:     2.27 cm  LEFT ATRIUM           Index LA diam:      2.60 cm 1.61 cm/m LA Vol (A2C): 34.3 ml 21.21 ml/m LA Vol (A4C): 42.4 ml 26.21 ml/m  TRICUSPID VALVE TR Peak grad:   25.0 mmHg TR Vmax:        250.00 cm/s  SHUNTS Systemic Diam: 1.70 cm  Fransico Him MD Electronically signed by Fransico Him MD Signature Date/Time: 11/17/2019/11:50:50 AM    Final    IR THORACENTESIS ASP PLEURAL SPACE W/IMG GUIDE  Result Date: 11/17/2019 INDICATION: History of lung cancer. Shortness of breath. Left hydropneumothorax seen on recent imaging. Request diagnostic and therapeutic thoracentesis. EXAM: ULTRASOUND GUIDED LEFT THORACENTESIS MEDICATIONS: None. COMPLICATIONS: None immediate. PROCEDURE: An ultrasound guided thoracentesis was thoroughly discussed with the patient and questions answered. The benefits, risks, alternatives and complications were also discussed. The patient understands and wishes to proceed with the procedure. Written consent was obtained. Ultrasound was performed to localize and mark an adequate pocket of fluid in the left chest. The area was then prepped and draped in the normal sterile  fashion. 1% Lidocaine was used for local anesthesia. Under ultrasound guidance a 6 Fr Safe-T-Centesis catheter was introduced. Thoracentesis was performed. The catheter was removed and a dressing applied. FINDINGS: A total of approximately 470 mL of serosanguineous fluid was removed. Samples were sent to the laboratory as requested by the clinical team. IMPRESSION: Successful ultrasound guided left thoracentesis yielding 470 mL of pleural fluid. Read by: Ascencion Dike PA-C Electronically Signed   By: Aletta Edouard M.D.   On: 11/17/2019 09:28    Procedures Procedures (including critical care time)  Medications Ordered in ED Medications  enoxaparin (LOVENOX) injection 40 mg (40 mg Subcutaneous Given 11/17/19 1124)  acetaminophen (TYLENOL) tablet 650 mg (650 mg Oral Given 11/17/19 1130)    Or  acetaminophen (TYLENOL) suppository 650 mg ( Rectal See Alternative 11/17/19 1130)  amLODipine (NORVASC) tablet 10 mg (10 mg Oral Given 11/17/19 1124)  atorvastatin (LIPITOR) tablet 20 mg (20 mg Oral Given 11/17/19 1135)  loperamide (IMODIUM) capsule 2 mg (has no administration in time range)  loratadine (CLARITIN) tablet 10 mg (10 mg Oral Given 11/17/19 1123)  Nintedanib (Ofev) CAPS 100 mg ( Oral Canceled Entry 11/17/19 1251)  polyvinyl alcohol (LIQUIFILM TEARS) 1.4 % ophthalmic solution 1 drop (1 drop Both Eyes Not Given 11/17/19 1251)  lidocaine (XYLOCAINE) 1 % (with pres) injection (  Canceled Entry 11/17/19 1251)  lidocaine (PF) (XYLOCAINE) 1 % injection (10 mLs Infiltration Given 11/17/19 0859)  feeding supplement (ENSURE ENLIVE) (ENSURE ENLIVE) liquid 237 mL (237 mLs Oral Given 11/17/19 1802)  gadobutrol (GADAVIST) 1 MMOL/ML injection 5.5 mL (5.5 mLs Intravenous Contrast Given 11/17/19 0519)    ED Course  I have reviewed the triage vital signs and the nursing notes.  Pertinent labs & imaging results that were available during my care of the patient were reviewed by me and considered in my  medical decision making (see chart for details).    MDM Rules/Calculators/A&P                      Emrey Thornley is a 78 y.o. female with medical history as above  who presents to the ED by recommendation of pulmonologist for new hydropneumothorax. Today she had a pet/ct scan to evaluate her chest for malignancy. She has findings of her left  chest suspicious for oncologic process. She reports symptoms of one month duration.  HPI and physical exam as above. She presents awake, alert, hemodynamically stable, afebrile, non toxic. Screening labs noted to be hyponatremic to 120, she has no mental status changes and is at baseline. Received call from IR this evening and they will plan to take her for thoracentesis tomorrow. No acute intervention at this time in the ER.  Pet scan: 1. Multiple areas of low level hypermetabolism associated with consolidative opacity in the left upper and lower lung with hypermetabolic left supraclavicular lymph node, focal hypermetabolism in the hilar regions and anterior mediastinum. Imaging features could be related to diffuse neoplasm (adenocarcinoma) with metastatic disease. Infectious/inflammatory etiology not excluded. 2. Moderate to large left hydropneumothorax, likely posttraumatic as there are acute to subacute displaced fractures of the left fourth and fifth ribs.  At this time plan for thoracentesis with IR tomorrow. I consulted medicine for admission, they have evaluated the patient and agree with plan for admission. I discussed this plan with the patient and family, they understand and agree with plan for admission.   Final Clinical Impression(s) / ED Diagnoses Final diagnoses:  Hydropneumothorax  Pleural effusion    Rx / DC Orders ED Discharge Orders    None       Jahnay Lantier, Lovena Le, MD 11/17/19 2053    Isla Pence, MD 11/17/19 [phone]

## 2019-11-16 NOTE — Telephone Encounter (Signed)
On call- Son [name] [name] called in reference to Dr Bari Mantis phone message today. Son was hoping to avoid long ER wait and potential exposure.  Without going into detail, I explained PET scan recognition of hydropneumothorax. I did not explain likely metastatic disease. I strongly recommended they put a mask on patient and take her now.  Dr Elsworth Soho anticipated admission to [facility] tonight, with Pulmonary Consultation in AM to arrange interventions.  Son indicated he would follow this advice.

## 2019-11-16 NOTE — Telephone Encounter (Signed)
Dr. Elsworth Soho,  This patient was last seen by you on 07/27/19. However you talked with Dr. Tonita Cong with Neurology on 11/06/19 regarding the patient. The PET scan has resulted and there are 7 impressions. Radiology called and said there were areas of concern that need to be addressed.   Please advise. Thanks.

## 2019-11-16 NOTE — ED Notes (Signed)
Sent a urine culture

## 2019-11-16 NOTE — H&P (Addendum)
TRH H&P    Patient Demographics:    [name] [name], is a 78 y.o. female  MRN: [id number]  DOB - 1941-05-30  Admit Date - 11/16/2019  Referring MD/NP/PA: Day  Outpatient Primary MD for the patient is Alroy Dust, L.Marlou Sa, MD [place] - radiation oncology  Patient coming from:  home  Chief complaint- hydropneumothorax   HPI:    [name] [name]  is a 78 y.o. female,  w hypertension, hyperlipidemia, ,  former smoker w h/o lung cancer stage 1A left upper lung (squamous cell carcinoma) 2014, Left lower lung (adenocarcinoma) 2015 , s/p SBRT on 2 occasions completed 2014, h/o ILD , presents with increase in dyspnea for the past month.  Pt notes slight dry cough (chronic). Slight left sided discomfort,  Pt denies fever, chills, palp, n/v, abd pain, diarrhea, brbpr, black stool.  Pt notes slight weight loss recently.    In ED,  T 97.8, P 111, R 22, Bp 143/73 pox 98% on 4L [place]  PET  IMPRESSION: 1. Multiple areas of low level hypermetabolism associated with consolidative opacity in the left upper and lower lung with hypermetabolic left supraclavicular lymph node, focal hypermetabolism in the hilar regions and anterior mediastinum. Imaging features could be related to diffuse neoplasm (adenocarcinoma) with metastatic disease. Infectious/inflammatory etiology not excluded. 2. Moderate to large left hydropneumothorax, likely posttraumatic as there are acute to subacute displaced fractures of the left fourth and fifth ribs. 3. Bandlike opacity in the anterior right lung with low level hypermetabolism, indeterminate.  4. Hypermetabolic focus in the left 11 paraspinal tissues without underlying mass lesion evident by CT. 5. Hypermetabolic activity in the posterior T3 and T4 vertebral bodies without associated mass lesion evident by CT. Thoracic spine MRI with and without contrast could be used to further  evaluate as clinically warranted. 6. Advanced chronic interstitial lung disease with fibrotic component. 7.  Aortic Atherosclerois (ICD10-170.0)  Na 120, K 3.6,  Bun 10, Creatinine 0.59 Wbc 11.9,Hgb 12.5, Plt 477  Poc sars negative   Pt will be admitted for dyspnea secondary to L hydropneumothorax, and hyponatremia and T 3/4, G38 hypermetabolic activity.     Review of systems:    In addition to the HPI above,  No Fever-chills, No Headache, No changes with Vision or hearing, No problems swallowing food or Liquids, No Abdominal pain, No Nausea or Vomiting, bowel movements are regular, No Blood in stool or Urine, No dysuria, No new skin rashes or bruises, No new joints pains-aches,  No new weakness, tingling, numbness in any extremity, No recent weight gain  No polyuria, polydypsia or polyphagia, No significant Mental Stressors.  All other systems reviewed and are negative.    Past History of the following :    Past Medical History:  Diagnosis Date  . Anxiety   . Arthritis    a. 10/2012 s/p Right THA.  Marland Kitchen Arthritis    "hands" (09/21/2017)  . Chronic lower back pain   . Complication of anesthesia    "couldn't wake me up after my hip surgery" (09/21/2017)  .  Depression   . History of ARDS   . Hyperlipidemia   . Hypertension   . Lung cancer ([place]) 02/16/13   LUL lung SQUAMOUS CELL  . Lung cancer ([place]) dx'd 12/2013    LLL ADENOCARCINOMA  . Pulmonary fibrosis ([place]) dx'd ~ 07/2017  . Pulmonary nodule    a. 10/2012 CT: 71m anterior LUL nodule w/ spiculated appearance - rec PET CT.  . Seasonal allergies    "I take Claritin" (09/21/2017)  . Sinus headache       Past Surgical History:  Procedure Laterality Date  . BACK SURGERY    . CATARACT EXTRACTION W/ INTRAOCULAR LENS  IMPLANT, BILATERAL Bilateral   . EYE SURGERY Right ~ 1949   "removed a growth"  . JOINT REPLACEMENT    . LUMBAR [place]roseSURGERY  08/2016   "[place] [facility] [place] [facility]  . LUNG  BIOPSY Left 02/16/13   LUL =non small cell ca with assoc/necrosis  . TONSILLECTOMY    . TOTAL HIP ARTHROPLASTY  11/03/2012   Procedure: TOTAL HIP ARTHROPLASTY ANTERIOR APPROACH;  Surgeon: CMcarthur Rossetti MD;  Location: [facility];  Service: Orthopedics;  Laterality: Right;  Right Total Hip Arthroplasty, Anterior Approach (C-Arm)  . TUBAL LIGATION        Social History:      Social History   Tobacco Use  . Smoking status: Former Smoker    Packs/day: 1.00    Years: 40.00    Pack years: 40.00    Types: Cigarettes    Quit date: 11/03/2012    Years since quitting: 7.0  . Smokeless tobacco: Never Used  Substance Use Topics  . Alcohol use: Yes    Alcohol/week: 17.0 standard drinks    Types: 17 Shots of liquor per week    Comment: 09/21/2017 "2-3 shots of liquor /night"       Family History :     Family History  Problem Relation Age of Onset  . CAD Father   . Lung disease Neg Hx   . Rheumatologic disease Neg Hx        Home Medications:   Prior to Admission medications   Medication Sig Start Date End Date Taking? Authorizing Provider  acetaminophen (TYLENOL) 500 MG tablet Take 500-1,000 mg by mouth every 6 (six) hours as needed for moderate pain.    Yes [provider]  amLODipine (NORVASC) 10 MG tablet Take 1 tablet (10 mg total) by mouth daily. 06/29/17  Yes ARigoberto Noel MD  atorvastatin (LIPITOR) 40 MG tablet Take 20 mg by mouth every morning.   Yes [provider]  celecoxib (CELEBREX) 100 MG capsule Take 100 mg by mouth 2 (two) times daily. 10/28/19  Yes [provider]  hydrochlorothiazide (HYDRODIURIL) 25 MG tablet Take 25 mg by mouth daily before breakfast.    Yes [provider]  Hypromellose (ARTIFICIAL TEARS OP) Place 1 drop into both eyes daily.   Yes [provider]  loperamide (IMODIUM) 2 MG capsule Take 2 mg by mouth as needed for diarrhea or loose stools.   Yes [provider]  loratadine (CLARITIN)  10 MG tablet Take 10 mg by mouth daily.   Yes [provider]  OFEV 100 MG CAPS TAKE 1 CAPSULE BY MOUTH TWICE A DAY Patient taking differently: Take 100 mg by mouth 2 (two) times daily.  10/05/19  Yes ARigoberto Noel MD     Allergies:     Allergies  Allergen Reactions  . Penicillins  Hives, Swelling and Rash    Has patient had a PCN reaction causing immediate rash, facial/tongue/throat swelling, SOB or lightheadedness with hypotension: Yes Has patient had a PCN reaction causing severe rash involving mucus membranes or skin necrosis: No Has patient had a PCN reaction that required hospitalization: No Has patient had a PCN reaction occurring within the last 10 years: No If all of the above answers are "NO", then may proceed with Cephalosporin use.      Physical Exam:   Vitals  Blood pressure (!) 147/78, pulse (!) 108, temperature 97.8 F (36.6 C), temperature source Oral, resp. rate (!) 21, SpO2 97 %.  1.  General: axoxo3  2. Psychiatric: euthymic  3. Neurologic: cn2-12 intact, reflexes 2+ symmetric, diffuse with no clonus, motor 5/5 in all 4 ext  4. HEENMT:  Anicteric, pupils 1.70m symmetric, direct, consensual intact Neck: no jvd  5. Respiratory : + crackles about 2/3 up, decrease bs at the left lung base about 1/3 up, no wheezing   6. Cardiovascular : rrr s1, s2, no m/g/r    7. Gastrointestinal:  Abd: soft, nt, nd, +bs  8. Skin:  Ext: no c/c/e, no rash  9.Musculoskeletal:  Good ROM    Data Review:    CBC Recent Labs  Lab 11/16/19 1947  WBC 11.9*  HGB 12.5  HCT 35.3*  PLT 477*  MCV 89.6  MCH 31.7  MCHC 35.4  RDW 12.1   ------------------------------------------------------------------------------------------------------------------  Results for orders placed or performed during the hospital encounter of 11/16/19 (from the past 48 hour(s))  Basic metabolic panel     Status: Abnormal   Collection Time: 11/16/19  7:47 PM  Result Value Ref  Range   Sodium 120 (L) 135 - 145 mmol/L   Potassium 3.6 3.5 - 5.1 mmol/L   Chloride 76 (L) 98 - 111 mmol/L   CO2 30 22 - 32 mmol/L   Glucose, Bld 118 (H) 70 - 99 mg/dL   BUN 10 8 - 23 mg/dL   Creatinine, Ser 0.59 0.44 - 1.00 mg/dL   Calcium 9.4 8.9 - 10.3 mg/dL   GFR calc non Af Amer >60 >60 mL/min   GFR calc Af Amer >60 >60 mL/min   Anion gap 14 5 - 15    Comment: Performed at [facility] [address] [place] [id number] CBC     Status: Abnormal   Collection Time: 11/16/19  7:47 PM  Result Value Ref Range   WBC 11.9 (H) 4.0 - 10.5 K/uL   RBC 3.94 3.87 - 5.11 MIL/uL   Hemoglobin 12.5 12.0 - 15.0 g/dL   HCT 35.3 (L) 36.0 - 46.0 %   MCV 89.6 80.0 - 100.0 fL   MCH 31.7 26.0 - 34.0 pg   MCHC 35.4 30.0 - 36.0 g/dL   RDW 12.1 11.5 - 15.5 %   Platelets 477 (H) 150 - 400 K/uL   nRBC 0.0 0.0 - 0.2 %    Comment: Performed at [facility] [address], [place] [address] POC SARS Coronavirus 2 Ag-ED - Nasal Swab (BD Veritor Kit)     Status: None   Collection Time: 11/16/19  9:00 PM  Result Value Ref Range   SARS Coronavirus 2 Ag NEGATIVE NEGATIVE    Comment: (NOTE) SARS-CoV-2 antigen NOT DETECTED.  Negative results are presumptive.  Negative results do not preclude SARS-CoV-2 infection and should not be used as the sole basis for treatment or other patient  management decisions, including infection  control decisions, particularly in the presence of clinical signs and  symptoms consistent with COVID-19, or in those who have been in contact with the virus.  Negative results must be combined with clinical observations, patient history, and epidemiological information. The expected result is Negative. Fact Sheet for Patients: PodPark.tn Fact Sheet for Healthcare Providers: GiftContent.is This test is not yet approved or cleared by the [place] FDA and  has been authorized for detection  and/or diagnosis of SARS-CoV-2 by FDA under an Emergency Use Authorization (EUA).  This EUA will remain in effect (meaning this test can be used) for the duration of  the COVID-19 de claration under Section 564(b)(1) of the Act, 21 U.S.C. section 360bbb-3(b)(1), unless the authorization is terminated or revoked sooner.   Sodium, urine, random     Status: None   Collection Time: 11/16/19  9:42 PM  Result Value Ref Range   Sodium, Ur 26 mmol/L    Comment: Performed at [facility] [address] [place], [place] [address]  Osmolality, urine     Status: Abnormal   Collection Time: 11/16/19  9:42 PM  Result Value Ref Range   Osmolality, Ur 191 (L) 300 - 900 mOsm/kg    Comment: Performed at [facility] [address], [place], [place] [address]    Chemistries  Recent Labs  Lab 11/16/19 1947  NA 120*  K 3.6  CL 76*  CO2 30  GLUCOSE 118*  BUN 10  CREATININE 0.59  CALCIUM 9.4   ------------------------------------------------------------------------------------------------------------------  ------------------------------------------------------------------------------------------------------------------ GFR: CrCl cannot be calculated (Unknown ideal weight.). Liver Function Tests: No results for input(s): AST, ALT, ALKPHOS, BILITOT, PROT, ALBUMIN in the last 168 hours. No results for input(s): LIPASE, AMYLASE in the last 168 hours. No results for input(s): AMMONIA in the last 168 hours. Coagulation Profile: No results for input(s): INR, PROTIME in the last 168 hours. Cardiac Enzymes: No results for input(s): CKTOTAL, CKMB, CKMBINDEX, TROPONINI in the last 168 hours. BNP (last 3 results) No results for input(s): PROBNP in the last 8760 hours. HbA1C: No results for input(s): HGBA1C in the last 72 hours. CBG: Recent Labs  Lab 11/16/19 1150  GLUCAP 128*   Lipid Profile: No results for input(s): CHOL, HDL, LDLCALC, TRIG, CHOLHDL, LDLDIRECT in the last 72  hours. Thyroid Function Tests: No results for input(s): TSH, T4TOTAL, FREET4, T3FREE, THYROIDAB in the last 72 hours. Anemia Panel: No results for input(s): VITAMINB12, FOLATE, FERRITIN, TIBC, IRON, RETICCTPCT in the last 72 hours.  --------------------------------------------------------------------------------------------------------------- Urine analysis:    Component Value Date/Time   COLORURINE YELLOW 11/09/2012 1034   APPEARANCEUR CLOUDY (A) 11/09/2012 1034   LABSPEC 1.020 11/09/2012 1034   PHURINE 6.0 11/09/2012 1034   GLUCOSEU NEGATIVE 11/09/2012 1034   HGBUR TRACE (A) 11/09/2012 [place] 11/09/2012 1034   KETONESUR NEGATIVE 11/09/2012 1034   PROTEINUR 30 (A) 11/09/2012 1034   UROBILINOGEN 1.0 11/09/2012 1034   NITRITE NEGATIVE 11/09/2012 1034   LEUKOCYTESUR SMALL (A) 11/09/2012 1034      Imaging Results:    NM PET Image Restag (PS) Skull Base To Thigh  Result Date: 11/16/2019 CLINICAL DATA:  Subsequent treatment strategy for lung cancer. EXAM: NUCLEAR MEDICINE PET SKULL BASE TO THIGH TECHNIQUE: 5.7 mCi F-18 FDG was injected intravenously. Full-ring PET imaging was performed from the skull base to thigh after the radiotracer. CT data was obtained and used for attenuation correction and anatomic localization. Fasting blood glucose: 128 mg/dl COMPARISON:  05/24/2017 FINDINGS: Mediastinal blood pool activity: SUV max 2.4 Liver activity: SUV max NA NECK: No hypermetabolic lymph nodes in the neck. Incidental CT findings: none CHEST: Multiple areas of hypermetabolism are identified in the anterior left lung including a 3.3 x 2.1 cm consolidative opacity in the medial left upper lung adjacent to the mediastinum (see image 53/4) with SUV max = 5.6. Consolidative opacity seen relatively diffusely in the left parahilar lung shows hypermetabolic FDG uptake with SUV max = 5.6. Focal uptake in the inferior left hilum demonstrates SUV max = 5.1. Focal uptake in the region  of the right atrium noted without underlying lesion evident on this noncontrast CT. There is some soft tissue attenuation anterior to the ascending aorta that stable since prior study. Bandlike opacity anterior right upper lobe (image 52/series 4) shows low level hypermetabolism with SUV max = 3.9. Focal hypermetabolism noted in the right hilum without lymphadenopathy evident by CT. Similar focal hypermetabolism identified inferior right hilum. There is a moderate left pleural fluid collection with gas identified in the left anterior pleural space. Low level diffuse FDG accumulation is identified along the left pleural surface. 9 mm short axis left supraclavicular node (34/4) demonstrates SUV max = 3.9. Focus of hypermetabolism identified in the left T11 paraspinal tissues without underlying mass lesion evident by CT. SUV max = 4.4. Incidental CT findings: There is abdominal aortic atherosclerosis without aneurysm. Acute to subacute displaced fractures noted left lateral fifth rib and posterolateral left sixth rib. Extensive chronic interstitial/fibrotic lung disease noted bilaterally. ABDOMEN/PELVIS: No abnormal hypermetabolic activity within the liver, pancreas, adrenal glands, or spleen. No hypermetabolic lymph nodes in the abdomen or pelvis. Incidental CT findings: There is abdominal aortic atherosclerosis without aneurysm. Layering tiny gallstones evident. Multiple left renal cysts of varying size and attenuation some with layering calcific debris or hemorrhage. SKELETON: Hypermetabolism is identified in the posterior aspect of the T3 and T4 vertebral bodies although no underlying bony lesion is evident. SUV max = 8.4 at the level of T3. Incidental CT findings: Diffuse degenerative changes noted in the spine. Acute to subacute left rib fractures as above. Patient is status post right hip replacement. IMPRESSION: 1. Multiple areas of low level hypermetabolism associated with consolidative opacity in the left  upper and lower lung with hypermetabolic left supraclavicular lymph node, focal hypermetabolism in the hilar regions and anterior mediastinum. Imaging features could be related to diffuse neoplasm (adenocarcinoma) with metastatic disease. Infectious/inflammatory etiology not excluded. 2. Moderate to large left hydropneumothorax, likely posttraumatic as there are acute to subacute displaced fractures of the left fourth and fifth ribs. 3. Bandlike opacity in the anterior right lung with low level hypermetabolism, indeterminate. 4. Hypermetabolic focus in the left 11 paraspinal tissues without underlying mass lesion evident by CT. 5. Hypermetabolic activity in the posterior T3 and T4 vertebral bodies without associated mass lesion evident by CT. Thoracic spine MRI with and without contrast could be used to further evaluate as clinically warranted. 6. Advanced chronic interstitial lung disease with fibrotic component. 7.  Aortic Atherosclerois (ICD10-170.0) These results will be called to the ordering clinician or representative by the Radiologist Assistant, and communication documented in the PACS or zVision Dashboard. Electronically Signed   By: Misty Stanley M.D.   On: 11/16/2019 15:09   Ekg nsr at 100, nl axis, prolonged qtc, RBBB, no st-t changes c/w ischemia   Assessment & Plan:    Principal Problem:   Dyspnea Active Problems:   Pulmonary fibrosis (HCC)   Hypertension  Hydropneumothorax  Dyspnea Secondary to mod-large hydropneumothorax Pulmonary consult requested for AM Ir consult for diagnostic and therapeutic thoracentesis placed in computer  Hyponatremia Check serum osm, cortisol, tsh Check urine osm urine sodium Hydrate with ns at 60m per hour x 8 hours, Check cmp in am  Tachycardia Likely related to hydropneumothorax, consider PE if, not improving Check trop I  Check tsh Check cardiac echo  T3/4, T 11 hypermetabolic activity MRI T spine with and without contrast.   Rib  fractures, acute and subacute Please monitor  ILD/. IPF Cont Ofev  Hypertension STOP Hydrochlorothiazide due to hyponatremia Cont Amlodipine 160mpo qday  Hyperlipidemia Cont Lipitor 2058mo qhs     DVT Prophylaxis-   Lovenox - SCDs   AM Labs Ordered, also please review Full Orders  Family Communication: Admission, patients condition and plan of care including tests being ordered have been discussed with the patient  who indicate understanding and agree with the plan and Code Status.  Code Status:  FULL CODE per patient, attempted to notify son at home number, not picking up. Please update him in AM  Admission status: Observation : Based on patients clinical presentation and evaluation of above clinical data, I have made determination that patient meets  obeservation criteria at this time.   Time spent in minutes : 70 minutes    JamJani GravelD on 11/17/2019 at 12:55 AM

## 2019-11-17 ENCOUNTER — Inpatient Hospital Stay (HOSPITAL_COMMUNITY): Payer: Medicare Other

## 2019-11-17 ENCOUNTER — Other Ambulatory Visit: Payer: Self-pay

## 2019-11-17 ENCOUNTER — Encounter (HOSPITAL_COMMUNITY): Payer: Self-pay | Admitting: Internal Medicine

## 2019-11-17 ENCOUNTER — Ambulatory Visit: Admit: 2019-11-17 | Payer: Medicare Other | Admitting: Radiation Oncology

## 2019-11-17 ENCOUNTER — Encounter: Payer: Self-pay | Admitting: *Deleted

## 2019-11-17 DIAGNOSIS — J84112 Idiopathic pulmonary fibrosis: Secondary | ICD-10-CM

## 2019-11-17 DIAGNOSIS — J9611 Chronic respiratory failure with hypoxia: Secondary | ICD-10-CM

## 2019-11-17 DIAGNOSIS — I5031 Acute diastolic (congestive) heart failure: Secondary | ICD-10-CM

## 2019-11-17 DIAGNOSIS — J948 Other specified pleural conditions: Secondary | ICD-10-CM

## 2019-11-17 HISTORY — PX: IR THORACENTESIS ASP PLEURAL SPACE W/IMG GUIDE: IMG5380

## 2019-11-17 LAB — COMPREHENSIVE METABOLIC PANEL
ALT: 12 U/L (ref 0–44)
AST: 21 U/L (ref 15–41)
Albumin: 2.9 g/dL — ABNORMAL LOW (ref 3.5–5.0)
Alkaline Phosphatase: 54 U/L (ref 38–126)
Anion gap: 12 (ref 5–15)
BUN: 10 mg/dL (ref 8–23)
CO2: 32 mmol/L (ref 22–32)
Calcium: 9.4 mg/dL (ref 8.9–10.3)
Chloride: 80 mmol/L — ABNORMAL LOW (ref 98–111)
Creatinine, Ser: 0.51 mg/dL (ref 0.44–1.00)
GFR calc Af Amer: >60 mL/min (ref >60)
GFR calc non Af Amer: >60 mL/min (ref >60)
Glucose, Bld: 86 mg/dL (ref 70–99)
Potassium: 3.5 mmol/L (ref 3.5–5.1)
Sodium: 124 mmol/L — ABNORMAL LOW (ref 135–145)
Total Bilirubin: 0.5 mg/dL (ref 0.3–1.2)
Total Protein: 5.8 g/dL — ABNORMAL LOW (ref 6.5–8.1)

## 2019-11-17 LAB — CBC
HCT: 36.7 % (ref 36.0–46.0)
Hemoglobin: 12.8 g/dL (ref 12.0–15.0)
MCH: 32.2 pg (ref 26.0–34.0)
MCHC: 34.9 g/dL (ref 30.0–36.0)
MCV: 92.2 fL (ref 80.0–100.0)
Platelets: 437 10*3/uL — ABNORMAL HIGH (ref 150–400)
RBC: 3.98 MIL/uL (ref 3.87–5.11)
RDW: 12.4 % (ref 11.5–15.5)
WBC: 12.2 10*3/uL — ABNORMAL HIGH (ref 4.0–10.5)
nRBC: 0 % (ref 0.0–0.2)

## 2019-11-17 LAB — SARS CORONAVIRUS 2 (TAT 6-24 HRS): SARS Coronavirus 2: NEGATIVE

## 2019-11-17 LAB — GLUCOSE, PLEURAL OR PERITONEAL FLUID: Glucose, Fluid: 89 mg/dL

## 2019-11-17 LAB — BODY FLUID CELL COUNT WITH DIFFERENTIAL
Eos, Fluid: 6 %
Lymphs, Fluid: 74 %
Monocyte-Macrophage-Serous Fluid: 13 % — ABNORMAL LOW (ref 50–90)
Neutrophil Count, Fluid: 7 % (ref 0–25)
Total Nucleated Cell Count, Fluid: 315 cu mm (ref 0–1000)

## 2019-11-17 LAB — PROTEIN, PLEURAL OR PERITONEAL FLUID: Total protein, fluid: 3.7 g/dL

## 2019-11-17 LAB — TSH: TSH: 2.429 u[IU]/mL (ref 0.350–4.500)

## 2019-11-17 LAB — TROPONIN I (HIGH SENSITIVITY)
Troponin I (High Sensitivity): 15 ng/L (ref <18)
Troponin I (High Sensitivity): 22 ng/L — ABNORMAL HIGH (ref <18)

## 2019-11-17 LAB — LACTATE DEHYDROGENASE, PLEURAL OR PERITONEAL FLUID: LD, Fluid: 142 U/L — ABNORMAL HIGH (ref 3–23)

## 2019-11-17 LAB — PROTIME-INR
INR: 1 (ref 0.8–1.2)
Prothrombin Time: 12.6 seconds (ref 11.4–15.2)

## 2019-11-17 LAB — CORTISOL: Cortisol, Plasma: 2.9 ug/dL

## 2019-11-17 LAB — ECHOCARDIOGRAM COMPLETE

## 2019-11-17 LAB — OSMOLALITY: Osmolality: 262 mOsm/kg — ABNORMAL LOW (ref 275–295)

## 2019-11-17 LAB — MRSA PCR SCREENING: MRSA by PCR: NEGATIVE

## 2019-11-17 MED ORDER — GADOBUTROL 1 MMOL/ML IV SOLN
5.5000 mL | Freq: Once | INTRAVENOUS | Status: AC | PRN
  Administered 2019-11-17: 5.5 mL via INTRAVENOUS

## 2019-11-17 MED ORDER — NINTEDANIB ESYLATE 100 MG PO CAPS
100.0000 mg | ORAL_CAPSULE | Freq: Two times a day (BID) | ORAL | Status: DC
  Administered 2019-11-17 – 2019-11-18 (×3): 100 mg via ORAL
  Filled 2019-11-17 (×5): qty 1

## 2019-11-17 MED ORDER — LOPERAMIDE HCL 2 MG PO CAPS: 2.0000 mg | ORAL_CAPSULE | ORAL | Status: DC | PRN

## 2019-11-17 MED ORDER — ENSURE ENLIVE PO LIQD
237.0000 mL | Freq: Two times a day (BID) | ORAL | Status: DC
  Administered 2019-11-17 – 2019-11-18 (×2): 237 mL via ORAL

## 2019-11-17 MED ORDER — AMLODIPINE BESYLATE 10 MG PO TABS
10.0000 mg | ORAL_TABLET | Freq: Every day | ORAL | Status: DC
  Administered 2019-11-17 – 2019-11-21 (×5): 10 mg via ORAL
  Filled 2019-11-17 (×4): qty 1
  Filled 2019-11-17: qty 2

## 2019-11-17 MED ORDER — LIDOCAINE HCL (PF) 1 % IJ SOLN
INTRAMUSCULAR | Status: DC | PRN
  Administered 2019-11-17: 10 mL

## 2019-11-17 MED ORDER — ATORVASTATIN CALCIUM 10 MG PO TABS
20.0000 mg | ORAL_TABLET | Freq: Every morning | ORAL | Status: DC
  Administered 2019-11-17 – 2019-11-21 (×5): 20 mg via ORAL
  Filled 2019-11-17 (×5): qty 2

## 2019-11-17 MED ORDER — LORATADINE 10 MG PO TABS
10.0000 mg | ORAL_TABLET | Freq: Every day | ORAL | Status: DC
  Administered 2019-11-17 – 2019-11-20 (×4): 10 mg via ORAL
  Filled 2019-11-17 (×5): qty 1

## 2019-11-17 MED ORDER — LIDOCAINE HCL 1 % IJ SOLN
INTRAMUSCULAR | Status: AC
  Filled 2019-11-17: qty 20

## 2019-11-17 MED ORDER — POLYVINYL ALCOHOL 1.4 % OP SOLN
1.0000 [drp] | Freq: Every day | OPHTHALMIC | Status: DC
  Administered 2019-11-18 – 2019-11-21 (×3): 1 [drp] via OPHTHALMIC
  Filled 2019-11-17: qty 15

## 2019-11-17 NOTE — Progress Notes (Signed)
Radiation Oncology         [phone] ________________________________  Name: [name] [name] MRN: [id number]  Date: 11/16/2019  DOB: [birth year]  Chart Note:  We received notification of this patient's recent admission and recent imaging findings which we have personally reviewed and wanted to take a minute to document our impression.  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  She was followed in our clinic for surveillance until 2018 with no evidence of disease recurrence and at that time decided to consolidate her follow-up care with her pulmonologist, Dr. Elsworth Soho.  She has continued in routine follow-up under his care and direction since that time with repeat CT chest scans in 2019 and January 2020 without evidence of disease recurrence and overall stable appearance of the radiation fibrosis.  More recently, she was seeing Dr. Tonita Cong for management of degenerative disc disease in the cervical spine and had an MRI in his office which had concerning findings for possible metastatic disease.  This was brought to the attention of Dr. Elsworth Soho who then ordered a PET scan which was performed on 11/16/2019 and revealed multiple areas of hypermetabolism associated with consolidative opacity in the left lung with hypermetabolic left supraclavicular lymph node and focal hypermetabolism in the hilar regions and anterior mediastinum concerning for metastatic disease.  There was also a moderate to large left hydropneumothorax with subacute displaced fractures of the left fourth and fifth ribs.  Additionally, there was hypermetabolic activity in the left T11 paraspinal tissues as well as posterior T3 and T4 vertebral bodies.  Given these findings, she was advised to present to the emergency department for hospital admission to facilitate further evaluation  and work-up.  An MRI of the thoracic spine earlier today showed abnormal signal and enhancement at T3, T4, T5, and T6 consistent with metastatic disease to the spine but no evidence of epidural disease, cord compression or pathologic fractures present.     A thoracentesis was performed this morning for both diagnostic and therapeutic purposes with 470 ml serosanguineous fluid removed and sent for cytology.  Results are pending.   Lab Findings: Lab Results  Component Value Date   WBC 12.2 (H) 11/17/2019   HGB 12.8 11/17/2019   HCT 36.7 11/17/2019   MCV 92.2 11/17/2019   PLT 437 (H) 11/17/2019    @LASTCHEM @  Radiographic Findings: DG Chest 1 View  Result Date: 11/17/2019 CLINICAL DATA:  Status post thoracentesis on the left EXAM: CHEST  1 VIEW COMPARISON:  November 17, 2019 radiograph obtained earlier in the day; PET-CT November 16, 2019 FINDINGS: There is a stable small left apical pneumothorax without tension component. Left pleural effusion is smaller following thoracentesis. There is underlying airspace consolidation in the left upper and mid lung regions. There is underlying fibrosis. No new opacity is evident. Heart is upper normal in size with pulmonary vascularity within normal limits. There is aortic atherosclerosis. No appreciable adenopathy evident by radiography. Known bony metastases are not well seen by radiography. IMPRESSION: Stable left apical pneumothorax without appreciable tension component. Left pleural effusion smaller following thoracentesis. Airspace opacity in the left upper to mid lung region remains. Underlying fibrosis. No new opacity. Stable cardiac silhouette. Aortic Atherosclerosis (ICD10-I70.0). Electronically Signed   By: Lowella Grip III M.D.   On: 11/17/2019 09:22   MR THORACIC SPINE W WO CONTRAST  Result Date: 11/17/2019 CLINICAL DATA:  Lung cancer. Abnormal uptake into the thoracic spine on the PET scan. EXAM: MRI THORACIC WITHOUT AND WITH CONTRAST  TECHNIQUE: Multiplanar and multiecho pulse sequences of the thoracic spine were obtained without and with intravenous contrast. CONTRAST:  5.9mL GADAVIST GADOBUTROL 1 MMOL/ML IV SOLN COMPARISON:  PET scan 11/16/2019. CT of the chest 12/06/2017 and 12/07/2018 FINDINGS: MRI THORACIC SPINE FINDINGS Alignment: Slight degenerative retrolisthesis is present at T11-12 and T12-L1. No other significant listhesis is present. Levoconvex curvature is centered at T7-8. Vertebrae: Abnormal signal and enhancement is present at T3, T4, T5, and T6, consistent with metastatic disease to the spine. Enhancement occupies the posterior 2/3 of the vertebral body at T3 without significant extension in the pedicles. There is diffuse enhancement in the posterior 1/3 of the vertebral body at T4 without abnormal signal and enhancement extending into the left pedicle. More heterogeneous posterior enhancement is present at T5 and T6 with abnormal signal and enhancement extending into the left pedicle of both levels. No pathologic fractures are present. No definite extraosseous tumor or enhancement is present. No other focal areas of enhancement are present within the thoracic or visualized upper lumbar spine. The scout image demonstrates signal change at C6 and possibly C5, is consistent with additional metastases. There is no definite enhancement in this region. This likely represents more typical degenerative change. Cord:  Normal signal is present in the thoracic spinal cord. Paraspinal and other soft tissues: Bilateral pleural effusions are present. Post obstructive disease is present in the left lung. Bilateral airspace opacities are noted. Complex cysts are again noted in the left kidney. Upper abdomen is otherwise unremarkable. Disc levels: T1-2: A broad-based disc protrusion is asymmetric to the right. Moderate right and mild left foraminal narrowing is present. T2-3: Broad-based disc bulging and facet disease contributes to mild  foraminal narrowing bilaterally, right greater than left. T3-4: Facet spurring contributes to mild right foraminal narrowing. T4-5: No significant disc disease. Facet disease is worse on the right. T5-6: No significant disc disease. Facet hypertrophy is worse on the left. T6-7: A prominent right paramedian disc extrusion is present. Disc material extends 9 mm above the inferior endplate of T6. There is contact and slight distortion of the ventral surface the cord without abnormal signal. T7-8: A shallow central disc protrusion is present. No significant stenosis is present. T8-9: A left paramedian disc protrusion is present. There is effacement of ventral CSF without significant stenosis. T9-10: A far right lateral disc protrusion extends into the foramen with moderate right and mild left foraminal narrowing. T10-11: Facet spurring contributes to mild right foraminal narrowing. T11-12: A broad-based disc protrusion is present without significant stenosis. T12-L1: A broad-based disc protrusion is present. Mild foraminal narrowing is noted bilaterally. IMPRESSION: 1. Abnormal signal and enhancement at T3, T4, T5, and T6 consistent with metastatic disease to the spine. 2. No pathologic fractures are present. 3. Multilevel spondylosis of the lumbar spine as described. 4. Bilateral lung disease and effusions, left greater than right. Please see PET scan from yesterday for further detail. 5. Complex left renal cysts are stable. Electronically Signed   By: San Morelle M.D.   On: 11/17/2019 05:43   NM PET Image Restag (PS) Skull Base To Thigh  Result Date: 11/16/2019 CLINICAL DATA:  Subsequent treatment strategy for lung cancer. EXAM: NUCLEAR MEDICINE PET SKULL BASE TO THIGH TECHNIQUE: 5.7 mCi F-18 FDG was injected intravenously. Full-ring PET imaging was performed from the skull base to thigh after the radiotracer.  CT data was obtained and used for attenuation correction and anatomic localization. Fasting  blood glucose: 128 mg/dl COMPARISON:  05/24/2017 FINDINGS: Mediastinal blood pool activity: SUV max 2.4 Liver activity: SUV max NA NECK: No hypermetabolic lymph nodes in the neck. Incidental CT findings: none CHEST: Multiple areas of hypermetabolism are identified in the anterior left lung including a 3.3 x 2.1 cm consolidative opacity in the medial left upper lung adjacent to the mediastinum (see image 53/4) with SUV max = 5.6. Consolidative opacity seen relatively diffusely in the left parahilar lung shows hypermetabolic FDG uptake with SUV max = 5.6. Focal uptake in the inferior left hilum demonstrates SUV max = 5.1. Focal uptake in the region of the right atrium noted without underlying lesion evident on this noncontrast CT. There is some soft tissue attenuation anterior to the ascending aorta that stable since prior study. Bandlike opacity anterior right upper lobe (image 52/series 4) shows low level hypermetabolism with SUV max = 3.9. Focal hypermetabolism noted in the right hilum without lymphadenopathy evident by CT. Similar focal hypermetabolism identified inferior right hilum. There is a moderate left pleural fluid collection with gas identified in the left anterior pleural space. Low level diffuse FDG accumulation is identified along the left pleural surface. 9 mm short axis left supraclavicular node (34/4) demonstrates SUV max = 3.9. Focus of hypermetabolism identified in the left T11 paraspinal tissues without underlying mass lesion evident by CT. SUV max = 4.4. Incidental CT findings: There is abdominal aortic atherosclerosis without aneurysm. Acute to subacute displaced fractures noted left lateral fifth rib and posterolateral left sixth rib. Extensive chronic interstitial/fibrotic lung disease noted bilaterally. ABDOMEN/PELVIS: No abnormal hypermetabolic activity within the liver, pancreas, adrenal glands, or spleen. No hypermetabolic lymph nodes in the abdomen or pelvis. Incidental CT findings:  There is abdominal aortic atherosclerosis without aneurysm. Layering tiny gallstones evident. Multiple left renal cysts of varying size and attenuation some with layering calcific debris or hemorrhage. SKELETON: Hypermetabolism is identified in the posterior aspect of the T3 and T4 vertebral bodies although no underlying bony lesion is evident. SUV max = 8.4 at the level of T3. Incidental CT findings: Diffuse degenerative changes noted in the spine. Acute to subacute left rib fractures as above. Patient is status post right hip replacement. IMPRESSION: 1. Multiple areas of low level hypermetabolism associated with consolidative opacity in the left upper and lower lung with hypermetabolic left supraclavicular lymph node, focal hypermetabolism in the hilar regions and anterior mediastinum. Imaging features could be related to diffuse neoplasm (adenocarcinoma) with metastatic disease. Infectious/inflammatory etiology not excluded. 2. Moderate to large left hydropneumothorax, likely posttraumatic as there are acute to subacute displaced fractures of the left fourth and fifth ribs. 3. Bandlike opacity in the anterior right lung with low level hypermetabolism, indeterminate. 4. Hypermetabolic focus in the left 11 paraspinal tissues without underlying mass lesion evident by CT. 5. Hypermetabolic activity in the posterior T3 and T4 vertebral bodies without associated mass lesion evident by CT. Thoracic spine MRI with and without contrast could be used to further evaluate as clinically warranted. 6. Advanced chronic interstitial lung disease with fibrotic component. 7.  Aortic Atherosclerois (ICD10-170.0) These results will be called to the ordering clinician or representative by the Radiologist Assistant, and communication documented in the PACS or zVision Dashboard. Electronically Signed   By: Misty Stanley M.D.   On: 11/16/2019 15:09   DG CHEST PORT 1 VIEW  Result Date: 11/17/2019 CLINICAL DATA:  History of broken  ribs and pneumothorax. Pleural  effusion. EXAM: PORTABLE CHEST 1 VIEW COMPARISON:  PET CT 11/16/2019.  Chest x-ray 09/22/2017. FINDINGS: Mediastinum stable. Stable cardiomegaly. Dense left mid lung infiltrate/mass noted. Chronic interstitial prominence noted. Small left pleural effusion noted. Tiny left apical pneumothorax cannot be excluded. This appears decreased in size from prior PET-CT of 11/16/2019. Left posterior fifth and sixth rib fractures again noted. IMPRESSION: 1. Dense left mid lung field infiltrate/mass noted. Reference is made to recent PET-CT report of 11/16/2019. Chronic interstitial prominence noted. Small left pleural effusion noted. 2. Tiny residual left apical pneumothorax cannot be excluded. This appears decreased in size from prior PET-CT of 11/16/2019. Left posterior fifth and sixth rib fractures again noted. Electronically Signed   By: Marcello Moores  Register   On: 11/17/2019 07:43   ECHOCARDIOGRAM COMPLETE  Result Date: 11/17/2019   ECHOCARDIOGRAM REPORT   Patient Name:   Adrena Smyth Date of Exam: 11/17/2019 Medical Rec #:  [id number]       Height:       62.0 in Accession #:    [id number]      Weight:       135.0 lb Date of Birth:  [birth year]       BSA:          1.62 m Patient Age:    78 years        BP:           109/66 mmHg Patient Gender: F               HR:           73 bpm. Exam Location:  Inpatient Procedure: Limited Echo and Limited Color Doppler Indications:    CHF-Acute Diastolic 101.75 / Z02.58  History:        Patient has prior history of Echocardiogram examinations, most                 recent 11/16/2019. Risk Factors:Hypertension and Dyslipidemia.                 Lung cancer, pulmonary fibrosis.  Sonographer:    Darlina Sicilian RDCS Referring Phys: [place]  1. Left ventricular ejection fraction, by visual estimation, is 60 to 65%. The left ventricle has normal function. Mildly increased left ventricular posterior wall thickness. There is severely increased  left ventricular hypertrophy of the basal septum.  2. Global right ventricle has normal systolic function.The right ventricular size is normal. No increase in right ventricular wall thickness.  3. Left atrial size was normal.  4. Right atrial size was normal.  5. The mitral valve is normal in structure. No evidence of mitral valve regurgitation. No evidence of mitral stenosis.  6. The tricuspid valve is normal in structure. Tricuspid valve regurgitation is trivial.  7. The aortic valve is normal in structure. Aortic valve regurgitation is not visualized. No evidence of aortic valve sclerosis or stenosis.  8. The pulmonic valve was normal in structure. Pulmonic valve regurgitation is not visualized.  9. Normal pulmonary artery systolic pressure. 10. The inferior vena cava is normal in size with greater than 50% respiratory variability, suggesting right atrial pressure of 3 mmHg. 11. The left ventricle has no regional wall motion abnormalities. 12. Left ventricular diastolic function could not be evaluated. FINDINGS  Left Ventricle: Left ventricular ejection fraction, by visual estimation, is 60 to 65%. The left ventricle has normal function. The left ventricle has no regional wall motion abnormalities. Mildly increased left ventricular posterior wall thickness. There is severely increased  left ventricular hypertrophy. Left ventricular diastolic function could not be evaluated. Indeterminate filling pressures. Right Ventricle: The right ventricular size is normal. No increase in right ventricular wall thickness. Global RV systolic function is has normal systolic function. The tricuspid regurgitant velocity is 2.50 m/s, and with an assumed right atrial pressure  of 3 mmHg, the estimated right ventricular systolic pressure is normal at 28.0 mmHg. Left Atrium: Left atrial size was normal in size. Right Atrium: Right atrial size was normal in size Pericardium: There is no evidence of pericardial effusion. Mitral Valve:  The mitral valve is normal in structure. No evidence of mitral valve regurgitation. No evidence of mitral valve stenosis by observation. Tricuspid Valve: The tricuspid valve is normal in structure. Tricuspid valve regurgitation is trivial. Aortic Valve: The aortic valve is normal in structure. Aortic valve regurgitation is not visualized. The aortic valve is structurally normal, with no evidence of sclerosis or stenosis. Pulmonic Valve: The pulmonic valve was normal in structure. Pulmonic valve regurgitation is not visualized. Pulmonic regurgitation is not visualized. Aorta: The aortic root, ascending aorta and aortic arch are all structurally normal, with no evidence of dilitation or obstruction. Venous: The inferior vena cava is normal in size with greater than 50% respiratory variability, suggesting right atrial pressure of 3 mmHg. IAS/Shunts: No atrial level shunt detected by color flow Doppler. There is no evidence of a patent foramen ovale. No ventricular septal defect is seen or detected. There is no evidence of an atrial septal defect.  LEFT VENTRICLE PLAX 2D LVIDd:         2.90 cm LVIDs:         1.70 cm LV PW:         1.40 cm LV IVS:        1.70 cm LVOT diam:     1.70 cm LV SV:         24 ml LV SV Index:   14.45 LVOT Area:     2.27 cm  LEFT ATRIUM           Index LA diam:      2.60 cm 1.61 cm/m LA Vol (A2C): 34.3 ml 21.21 ml/m LA Vol (A4C): 42.4 ml 26.21 ml/m  TRICUSPID VALVE TR Peak grad:   25.0 mmHg TR Vmax:        250.00 cm/s  SHUNTS Systemic Diam: 1.70 cm  Fransico Him MD Electronically signed by Fransico Him MD Signature Date/Time: 11/17/2019/11:50:50 AM    Final    IR THORACENTESIS ASP PLEURAL SPACE W/IMG GUIDE  Result Date: 11/17/2019 INDICATION: History of lung cancer. Shortness of breath. Left hydropneumothorax seen on recent imaging. Request diagnostic and therapeutic thoracentesis. EXAM: ULTRASOUND GUIDED LEFT THORACENTESIS MEDICATIONS: None. COMPLICATIONS: None immediate. PROCEDURE: An  ultrasound guided thoracentesis was thoroughly discussed with the patient and questions answered. The benefits, risks, alternatives and complications were also discussed. The patient understands and wishes to proceed with the procedure. Written consent was obtained. Ultrasound was performed to localize and mark an adequate pocket of fluid in the left chest. The area was then prepped and draped in the normal sterile fashion. 1% Lidocaine was used for local anesthesia. Under ultrasound guidance a 6 Fr Safe-T-Centesis catheter was introduced. Thoracentesis was performed. The catheter was removed and a dressing applied. FINDINGS: A total of approximately 470 mL of serosanguineous fluid was removed. Samples were sent to the laboratory as requested by the clinical team. IMPRESSION: Successful ultrasound guided left thoracentesis yielding 470 mL of pleural fluid. Read by:  Ascencion Dike PA-C Electronically Signed   By: Aletta Edouard M.D.   On: 11/17/2019 09:28    Impression:  In light of this information, we have recommended that she get connected with medical oncology and follow up in the radiation oncology clinic on an outpatient basis to discuss possible palliative radiation to the disease in the spine.   Plan:  At this point, the patient is set up to proceed with outpatient consult in radiation oncology at 1pm on Tuesday 11/28/19 with CT SIM to follow for treatment planning purposes.  We have also reached out to Norton Blizzard, RN, lung cancer navigator who is working on getting the patient scheduled with Dr. Julien Nordmann in the near future.   Nicholos Johns, PA-C    Tyler Pita, MD  [facility] Direct Dial: [phone]  Fax: [phone] [email or web address]  Skype  LinkedIn

## 2019-11-17 NOTE — Progress Notes (Signed)
PROGRESS NOTE    [name] [name]  FGH:[id number] DOB: 03/29/1941 DOA: 11/16/2019 PCP: Alroy Dust, L.Marlou Sa, MD     Brief Narrative:  [name] [name] is a 78 y.o. 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 Elsworth Soho who presents secondary to PET scan finding of hydropneumothorax.  PET scan obtained as an outpatient revealed multiple areas of hypermetabolism associated with consolidative opacity in the left lung with hypermetabolic left supraclavicular lymph node.  Also revealed moderate to large left hydropneumothorax.  New events last 24 hours / Subjective: Patient without any significant complaints of shortness of breath.  Underwent thoracentesis early this morning.  Assessment & Plan:   Principal Problem:   Hydropneumothorax Active Problems:   Pulmonary fibrosis (HCC)   Hypertension   Idiopathic pulmonary fibrosis (HCC)   Dyspnea   Acute on chronic hypoxemic respiratory failure secondary to hydropneumothorax -Chronically on 3L [place] O2 at baseline   -Covid screening negative -Status post thoracentesis 12/18. Stable left apical pneumothorax s/p thoracentesis -Await pleural studies -Repeat CXR in AM  -PCCM following   Concern for metastatic disease with history of lung cancer in the past -PET: multiple areas of low level hypermetabolism associated with consolidative opacity in the left upper and lower lung with hypermetabolic left supraclavicular lymph node, focal hypermetabolism in the hilar regions and anterior mediastinum. -MRI thoracic spine: abnormal signal and enhancement at T3, T4, T5, and T6 consistent with metastatic disease to the spine -Radiation oncology consulted today   Hyponatremia -Hold HCTZ -Improving, monitor BMP  Acute/subacute rib fracture -IS   ILD/IPF -Ofev    Essential hypertension -HCTZ held due to hyponatremia -Amlodipine  Hyperlipidemia -Lipitor   DVT prophylaxis: Lovenox  Code Status: Full Family Communication: None at bedside Disposition Plan: Pending clinical stabilization, repeat CXR planned for AM. Await thoracentesis fluid study result. Rad onc consult in process.    Consultants:   Pulmonology  Radiation oncology  Procedures:   Left thoracentesis by IR 12/18  Antimicrobials:  Anti-infectives (From admission, onward)   None        Objective: Vitals:   11/17/19 0300 11/17/19 0400 11/17/19 0615 11/17/19 0824  BP: (!) 161/88 (!) 161/135  (!) 176/92  Pulse: (!) 106 (!) 119 (!) 106 (!) 112  Resp: 13 (!) 29 (!) 26 (!) 24  Temp:      TempSrc:      SpO2: 97% 96% 99% 97%    Intake/Output Summary (Last 24 hours) at 11/17/2019 1050 Last data filed at 11/17/2019 0906 Gross per 24 hour  Intake 100 ml  Output 470 ml  Net -370 ml   There were no vitals filed for this visit.  Examination:  General exam: Appears calm and comfortable  Respiratory system: Respiratory effort normal. No respiratory distress. No conversational dyspnea.  On nasal cannula O2 Cardiovascular system: S1 & S2 heard, tachycardic, regular rhythm. No murmurs. No pedal edema. Gastrointestinal system: Abdomen is nondistended, soft and nontender. Normal bowel sounds heard. Central nervous system: Alert and oriented. No focal neurological deficits. Speech clear.  Extremities: Symmetric in appearance  Skin: No rashes, lesions or ulcers on exposed skin  Psychiatry: Judgement and insight appear normal. Mood & affect appropriate.   Data Reviewed: I have personally reviewed following labs and imaging studies  CBC: Recent Labs  Lab 11/16/19 1947 11/17/19 0156  WBC 11.9* 12.2*  HGB 12.5 12.8  HCT 35.3* 36.7  MCV 89.6 92.2  PLT 477* 761*   Basic Metabolic Panel: Recent Labs  Lab 11/16/19 1947 11/17/19 0156  NA 120* 124*  K 3.6 3.5  CL  76* 80*  CO2 30 32  GLUCOSE 118* 86  BUN 10 10  CREATININE 0.59 0.51  CALCIUM 9.4 9.4   GFR: CrCl cannot be calculated (Unknown ideal weight.). Liver Function Tests: Recent Labs  Lab 11/17/19 0156  AST 21  ALT 12  ALKPHOS 54  BILITOT 0.5  PROT 5.8*  ALBUMIN 2.9*   No results for input(s): LIPASE, AMYLASE in the last 168 hours. No results for input(s): AMMONIA in the last 168 hours. Coagulation Profile: Recent Labs  Lab 11/17/19 0156  INR 1.0   Cardiac Enzymes: No results for input(s): CKTOTAL, CKMB, CKMBINDEX, TROPONINI in the last 168 hours. BNP (last 3 results) No results for input(s): PROBNP in the last 8760 hours. HbA1C: No results for input(s): HGBA1C in the last 72 hours. CBG: Recent Labs  Lab 11/16/19 1150  GLUCAP 128*   Lipid Profile: No results for input(s): CHOL, HDL, LDLCALC, TRIG, CHOLHDL, LDLDIRECT in the last 72 hours. Thyroid Function Tests: No results for input(s): TSH, T4TOTAL, FREET4, T3FREE, THYROIDAB in the last 72 hours. Anemia Panel: No results for input(s): VITAMINB12, FOLATE, FERRITIN, TIBC, IRON, RETICCTPCT in the last 72 hours. Sepsis Labs: No results for input(s): PROCALCITON, LATICACIDVEN in the last 168 hours.  Recent Results (from the past 240 hour(s))  SARS CORONAVIRUS 2 (TAT 6-24 HRS) Nasopharyngeal Nasopharyngeal Swab     Status: None   Collection Time: 11/16/19 10:08 PM   Specimen: Nasopharyngeal Swab  Result Value Ref Range Status   SARS Coronavirus 2 NEGATIVE NEGATIVE Final    Comment: (NOTE) SARS-CoV-2 target nucleic acids are NOT DETECTED. The SARS-CoV-2 RNA is generally detectable in upper and lower respiratory specimens during the acute phase of infection. Negative results do not preclude SARS-CoV-2 infection, do not rule out co-infections with other pathogens, and should not be used as the sole basis for treatment or other patient management decisions. Negative results must be combined with clinical  observations, patient history, and epidemiological information. The expected result is Negative. Fact Sheet for Patients: SugarRoll.be Fact Sheet for Healthcare Providers: https://www.woods-mathews.com/ This test is not yet approved or cleared by the [place] FDA and  has been authorized for detection and/or diagnosis of SARS-CoV-2 by FDA under an Emergency Use Authorization (EUA). This EUA will remain  in effect (meaning this test can be used) for the duration of the COVID-19 declaration under Section 56 4(b)(1) of the Act, 21 U.S.C. section 360bbb-3(b)(1), unless the authorization is terminated or revoked sooner. Performed at [facility], [place] [address], [place], [place] [address]       Radiology Studies: DG Chest 1 View  Result Date: 11/17/2019 CLINICAL DATA:  Status post thoracentesis on the left EXAM: CHEST  1 VIEW COMPARISON:  November 17, 2019 radiograph obtained earlier in the day; PET-CT November 16, 2019 FINDINGS: There is a stable small left apical pneumothorax without tension component. Left pleural effusion is smaller following thoracentesis. There is underlying airspace consolidation in the left upper and mid lung regions. There is underlying fibrosis. No new opacity is evident. Heart is upper normal in size with pulmonary vascularity within normal limits. There is aortic atherosclerosis. No appreciable adenopathy evident by radiography. Known bony metastases are not well seen by radiography. IMPRESSION: Stable left apical pneumothorax without appreciable tension component. Left pleural effusion  smaller following thoracentesis. Airspace opacity in the left upper to mid lung region remains. Underlying fibrosis. No new opacity. Stable cardiac silhouette. Aortic Atherosclerosis (ICD10-I70.0). Electronically Signed   By: Lowella Grip III M.D.   On: 11/17/2019 09:22   MR THORACIC SPINE W WO CONTRAST  Result Date:  11/17/2019 CLINICAL DATA:  Lung cancer. Abnormal uptake into the thoracic spine on the PET scan. EXAM: MRI THORACIC WITHOUT AND WITH CONTRAST TECHNIQUE: Multiplanar and multiecho pulse sequences of the thoracic spine were obtained without and with intravenous contrast. CONTRAST:  5.104mL GADAVIST GADOBUTROL 1 MMOL/ML IV SOLN COMPARISON:  PET scan 11/16/2019. CT of the chest 12/06/2017 and 12/07/2018 FINDINGS: MRI THORACIC SPINE FINDINGS Alignment: Slight degenerative retrolisthesis is present at T11-12 and T12-L1. No other significant listhesis is present. Levoconvex curvature is centered at T7-8. Vertebrae: Abnormal signal and enhancement is present at T3, T4, T5, and T6, consistent with metastatic disease to the spine. Enhancement occupies the posterior 2/3 of the vertebral body at T3 without significant extension in the pedicles. There is diffuse enhancement in the posterior 1/3 of the vertebral body at T4 without abnormal signal and enhancement extending into the left pedicle. More heterogeneous posterior enhancement is present at T5 and T6 with abnormal signal and enhancement extending into the left pedicle of both levels. No pathologic fractures are present. No definite extraosseous tumor or enhancement is present. No other focal areas of enhancement are present within the thoracic or visualized upper lumbar spine. The scout image demonstrates signal change at C6 and possibly C5, is consistent with additional metastases. There is no definite enhancement in this region. This likely represents more typical degenerative change. Cord:  Normal signal is present in the thoracic spinal cord. Paraspinal and other soft tissues: Bilateral pleural effusions are present. Post obstructive disease is present in the left lung. Bilateral airspace opacities are noted. Complex cysts are again noted in the left kidney. Upper abdomen is otherwise unremarkable. Disc levels: T1-2: A broad-based disc protrusion is asymmetric to the  right. Moderate right and mild left foraminal narrowing is present. T2-3: Broad-based disc bulging and facet disease contributes to mild foraminal narrowing bilaterally, right greater than left. T3-4: Facet spurring contributes to mild right foraminal narrowing. T4-5: No significant disc disease. Facet disease is worse on the right. T5-6: No significant disc disease. Facet hypertrophy is worse on the left. T6-7: A prominent right paramedian disc extrusion is present. Disc material extends 9 mm above the inferior endplate of T6. There is contact and slight distortion of the ventral surface the cord without abnormal signal. T7-8: A shallow central disc protrusion is present. No significant stenosis is present. T8-9: A left paramedian disc protrusion is present. There is effacement of ventral CSF without significant stenosis. T9-10: A far right lateral disc protrusion extends into the foramen with moderate right and mild left foraminal narrowing. T10-11: Facet spurring contributes to mild right foraminal narrowing. T11-12: A broad-based disc protrusion is present without significant stenosis. T12-L1: A broad-based disc protrusion is present. Mild foraminal narrowing is noted bilaterally. IMPRESSION: 1. Abnormal signal and enhancement at T3, T4, T5, and T6 consistent with metastatic disease to the spine. 2. No pathologic fractures are present. 3. Multilevel spondylosis of the lumbar spine as described. 4. Bilateral lung disease and effusions, left greater than right. Please see PET scan from yesterday for further detail. 5. Complex left renal cysts are stable. Electronically Signed   By: San Morelle M.D.   On: 11/17/2019 05:43   NM PET Image  Restag (PS) Skull Base To Thigh  Result Date: 11/16/2019 CLINICAL DATA:  Subsequent treatment strategy for lung cancer. EXAM: NUCLEAR MEDICINE PET SKULL BASE TO THIGH TECHNIQUE: 5.7 mCi F-18 FDG was injected intravenously. Full-ring PET imaging was performed from the  skull base to thigh after the radiotracer. CT data was obtained and used for attenuation correction and anatomic localization. Fasting blood glucose: 128 mg/dl COMPARISON:  05/24/2017 FINDINGS: Mediastinal blood pool activity: SUV max 2.4 Liver activity: SUV max NA NECK: No hypermetabolic lymph nodes in the neck. Incidental CT findings: none CHEST: Multiple areas of hypermetabolism are identified in the anterior left lung including a 3.3 x 2.1 cm consolidative opacity in the medial left upper lung adjacent to the mediastinum (see image 53/4) with SUV max = 5.6. Consolidative opacity seen relatively diffusely in the left parahilar lung shows hypermetabolic FDG uptake with SUV max = 5.6. Focal uptake in the inferior left hilum demonstrates SUV max = 5.1. Focal uptake in the region of the right atrium noted without underlying lesion evident on this noncontrast CT. There is some soft tissue attenuation anterior to the ascending aorta that stable since prior study. Bandlike opacity anterior right upper lobe (image 52/series 4) shows low level hypermetabolism with SUV max = 3.9. Focal hypermetabolism noted in the right hilum without lymphadenopathy evident by CT. Similar focal hypermetabolism identified inferior right hilum. There is a moderate left pleural fluid collection with gas identified in the left anterior pleural space. Low level diffuse FDG accumulation is identified along the left pleural surface. 9 mm short axis left supraclavicular node (34/4) demonstrates SUV max = 3.9. Focus of hypermetabolism identified in the left T11 paraspinal tissues without underlying mass lesion evident by CT. SUV max = 4.4. Incidental CT findings: There is abdominal aortic atherosclerosis without aneurysm. Acute to subacute displaced fractures noted left lateral fifth rib and posterolateral left sixth rib. Extensive chronic interstitial/fibrotic lung disease noted bilaterally. ABDOMEN/PELVIS: No abnormal hypermetabolic activity  within the liver, pancreas, adrenal glands, or spleen. No hypermetabolic lymph nodes in the abdomen or pelvis. Incidental CT findings: There is abdominal aortic atherosclerosis without aneurysm. Layering tiny gallstones evident. Multiple left renal cysts of varying size and attenuation some with layering calcific debris or hemorrhage. SKELETON: Hypermetabolism is identified in the posterior aspect of the T3 and T4 vertebral bodies although no underlying bony lesion is evident. SUV max = 8.4 at the level of T3. Incidental CT findings: Diffuse degenerative changes noted in the spine. Acute to subacute left rib fractures as above. Patient is status post right hip replacement. IMPRESSION: 1. Multiple areas of low level hypermetabolism associated with consolidative opacity in the left upper and lower lung with hypermetabolic left supraclavicular lymph node, focal hypermetabolism in the hilar regions and anterior mediastinum. Imaging features could be related to diffuse neoplasm (adenocarcinoma) with metastatic disease. Infectious/inflammatory etiology not excluded. 2. Moderate to large left hydropneumothorax, likely posttraumatic as there are acute to subacute displaced fractures of the left fourth and fifth ribs. 3. Bandlike opacity in the anterior right lung with low level hypermetabolism, indeterminate. 4. Hypermetabolic focus in the left 11 paraspinal tissues without underlying mass lesion evident by CT. 5. Hypermetabolic activity in the posterior T3 and T4 vertebral bodies without associated mass lesion evident by CT. Thoracic spine MRI with and without contrast could be used to further evaluate as clinically warranted. 6. Advanced chronic interstitial lung disease with fibrotic component. 7.  Aortic Atherosclerois (ICD10-170.0) These results will be called to the ordering clinician or representative  by the Radiologist Assistant, and communication documented in the PACS or zVision Dashboard. Electronically Signed    By: Misty Stanley M.D.   On: 11/16/2019 15:09   DG CHEST PORT 1 VIEW  Result Date: 11/17/2019 CLINICAL DATA:  History of broken ribs and pneumothorax. Pleural effusion. EXAM: PORTABLE CHEST 1 VIEW COMPARISON:  PET CT 11/16/2019.  Chest x-ray 09/22/2017. FINDINGS: Mediastinum stable. Stable cardiomegaly. Dense left mid lung infiltrate/mass noted. Chronic interstitial prominence noted. Small left pleural effusion noted. Tiny left apical pneumothorax cannot be excluded. This appears decreased in size from prior PET-CT of 11/16/2019. Left posterior fifth and sixth rib fractures again noted. IMPRESSION: 1. Dense left mid lung field infiltrate/mass noted. Reference is made to recent PET-CT report of 11/16/2019. Chronic interstitial prominence noted. Small left pleural effusion noted. 2. Tiny residual left apical pneumothorax cannot be excluded. This appears decreased in size from prior PET-CT of 11/16/2019. Left posterior fifth and sixth rib fractures again noted. Electronically Signed   By: [place]   On: 11/17/2019 07:43      Scheduled Meds: . amLODipine  10 mg Oral Daily  . atorvastatin  20 mg Oral q morning - 10a  . enoxaparin (LOVENOX) injection  40 mg Subcutaneous Q24H  . lidocaine      . loratadine  10 mg Oral Daily  . Nintedanib  100 mg Oral BID  . polyvinyl alcohol  1 drop Both Eyes Daily   Continuous Infusions:   LOS: 1 day      Time spent: 45 minutes   Dessa Phi, DO [facility] 11/17/2019, 10:50 AM   Available via Epic secure chat 7am-7pm After these hours, please refer to coverage provider listed on [email or web address]

## 2019-11-17 NOTE — ED Notes (Signed)
Echo done.

## 2019-11-17 NOTE — ED Notes (Signed)
Pt to IR with tranporter

## 2019-11-17 NOTE — ED Notes (Signed)
Lunch Tray Ordered @ 1045 

## 2019-11-17 NOTE — Progress Notes (Signed)
  Echocardiogram 2D Echocardiogram has been performed.  [name] [name] M 11/17/2019, 10:47 AM

## 2019-11-17 NOTE — Progress Notes (Signed)
Oncology Nurse Navigator Documentation  Oncology Nurse Navigator Flowsheets 11/17/2019  Navigator Location [facility]  Navigator Encounter Type Other:  Treatment Phase -  Barriers/Navigation Needs Coordination of Care/I received a message from Dr. Tammi Klippel that Ms. Pecore needs to be seen with Dr. Julien Nordmann ASAP.  I updated Dr. Julien Nordmann.  Interventions Coordination of Care  Acuity Level 2-Minimal Needs (1-2 Barriers Identified)  Coordination of Care Other  Time Spent with Patient 15

## 2019-11-17 NOTE — Progress Notes (Signed)
Initial Nutrition Assessment  DOCUMENTATION CODES:   Not applicable  INTERVENTION:  Provide Ensure Enlive po BID, each supplement provides 350 kcal and 20 grams of protein  Encourage adequate PO intake.   Recommend obtaining new weight to fully assess weight trends.  NUTRITION DIAGNOSIS:   Increased nutrient needs related to chronic illness as evidenced by estimated needs.  GOAL:   Patient will meet greater than or equal to 90% of their needs  MONITOR:   PO intake, Supplement acceptance, Skin, Weight trends, I & O's, Labs  REASON FOR ASSESSMENT:   Malnutrition Screening Tool    ASSESSMENT:   78 year old woman with prior history of lung cancer status post SBRT and IPF with chronic hypoxic respiratory failure on 4 L O2, admitted with abnormal PET scan showing left hydropneumothorax Per MD, concern for metastatic disease with history of lung cancer in the past. Underwent thoracentesis 12/18 yield of 470 ml with specimen sent for labs.  Pt reports having a good appetite currently and PTA with usual consumption of at least 3 meals day with no difficulties. Pt reports following a high protein diet at home. Noted no new weight recorded. Recommend obtaining new weight to fully assess weight trends. Pt reports usual body weight of ~122 lbs. RD to order nutritional supplements to aid in caloric and protein needs.    NUTRITION - FOCUSED PHYSICAL EXAM:    Most Recent Value  Orbital Region  No depletion  Upper Arm Region  No depletion  Thoracic and Lumbar Region  No depletion  Buccal Region  Unable to assess  Temple Region  Moderate depletion  Clavicle Bone Region  Mild depletion  Clavicle and Acromion Bone Region  Mild depletion  Scapular Bone Region  Unable to assess  Dorsal Hand  Unable to assess  Patellar Region  Moderate depletion  Anterior Thigh Region  Moderate depletion  Posterior Calf Region  Moderate depletion  Edema (RD Assessment)  None  Hair  Reviewed  Eyes   Reviewed  Mouth  Reviewed  Skin  Reviewed  Nails  Reviewed        Labs and medications reviewed.   Diet Order:   Diet Order            Diet regular Room service appropriate? Yes; Fluid consistency: Thin  Diet effective now              EDUCATION NEEDS:   Not appropriate for education at this time  Skin:  Skin Assessment: Reviewed RN Assessment  Last BM:  Unknown  Height:   Ht Readings from Last 1 Encounters:  07/27/19 5\' 2"  (1.575 m)    Weight:   Wt Readings from Last 1 Encounters:  07/27/19 55.5 kg    Ideal Body Weight:  50 kg  BMI:  There is no height or weight on file to calculate BMI.  Estimated Nutritional Needs:   Kcal:  1700-1900  Protein:  75-90 grams  Fluid:  1.7 - 1.9 L/day    Corrin Parker, MS, RD, LDN Pager # [phone] After hours/ weekend pager # [phone]

## 2019-11-17 NOTE — Procedures (Signed)
PROCEDURE SUMMARY:  Successful US guided left thoracentesis. Yielded 470 mL of serosanguinous fluid. Pt tolerated procedure well. No immediate complications.  Specimen was sent for labs. CXR ordered.  EBL < 5 mL  Ascencion Dike PA-C 11/17/2019 9:14 AM

## 2019-11-17 NOTE — Consult Note (Signed)
Name: [name] [name] MRN: [id number] DOB: 07/10/1941    ADMISSION DATE:  11/16/2019 CONSULTATION DATE:  11/17/2019   REFERRING MD :  [name] [name]  CHIEF COMPLAINT:  Abnormal PET scan   BRIEF PATIENT DESCRIPTION: 78 year old woman with prior history of lung cancer status post SBRT and IPF with chronic hypoxic respiratory failure on 4 L O2, admitted with abnormal PET scan showing left hydropneumothorax  SIGNIFICANT EVENTS  12/17 hospital admission  STUDIES:  PET 12/17 >> Multiple areas of low level hypermetabolism associated with consolidative opacity in the left upper and lower lung with hypermetabolic left supraclavicular lymph node, focal hypermetabolism in the hilar regions and anterior mediastinum. Hypermetabolic activity in the posterior T3 and T4 vertebral bodies without associated mass lesion evident by CT 12/18 MR T-spine >>Abnormal signal and enhancement at T3, T4, T5, and T6 consistent with metastatic disease to the spine  Left thoracentesis 12/18 >>   HISTORY OF PRESENT ILLNESS: 78 year old ex-smoker whom I follow as an outpatient for IPF and chronic hypoxic respiratory failure on 3 to 4 L oxygen. He has been maintained on O FEV at reduced dose of 100 twice daily due to diarrhea She underwent SBRT on 2 separate occasions for stage IA lung cancer Left upper lobe squamous cell carcinoma status post SBRT completed 03/08/2013. Left lower lobe adenocarcinoma status post SBRT completed 01/29/2014.  Her last HRCT was 11/2018 which showed progression of UIP since 2019 Her last office visit with me was 07/2019 , plan was for repeat CT in 12/2019. She developed neck pain and went to see Dr. Tonita Cong from orthopedics to obtain MRI C-spine, showed degenerative disc disease and disc bulges at multiple levels and hyperintense marrow.  Hence we decided to proceed with PET scan. We obtain PET scan results on 12/17 which showed left hydropneumothorax hence I asked patient to come to  hospital for admission.     PAST MEDICAL HISTORY :   has a past medical history of Anxiety, Arthritis, Arthritis, Chronic lower back pain, Complication of anesthesia, Depression, History of ARDS, Hyperlipidemia, Hypertension, Lung cancer ([place]) (02/16/13), Lung cancer ([place]) (dx'd 12/2013), Pulmonary fibrosis ([place]) (dx'd ~ 07/2017), Pulmonary nodule, Seasonal allergies, and Sinus headache.  has a past surgical history that includes Tonsillectomy; Total hip arthroplasty (11/03/2012); Tubal ligation; Lung biopsy (Left, 02/16/13); Joint replacement; Back surgery; Lumbar disc surgery (08/2016); Eye surgery (Right, ~ 1949); and Cataract extraction w/ intraocular lens  implant, bilateral (Bilateral). Prior to Admission medications   Medication Sig Start Date End Date Taking? Authorizing Provider  acetaminophen (TYLENOL) 500 MG tablet Take 500-1,000 mg by mouth every 6 (six) hours as needed for moderate pain.    Yes [provider]  amLODipine (NORVASC) 10 MG tablet Take 1 tablet (10 mg total) by mouth daily. 06/29/17  Yes Rigoberto Noel, MD  atorvastatin (LIPITOR) 40 MG tablet Take 20 mg by mouth every morning.   Yes [provider]  celecoxib (CELEBREX) 100 MG capsule Take 100 mg by mouth 2 (two) times daily. 10/28/19  Yes [provider]  hydrochlorothiazide (HYDRODIURIL) 25 MG tablet Take 25 mg by mouth daily before breakfast.    Yes [provider]  Hypromellose (ARTIFICIAL TEARS OP) Place 1 drop into both eyes daily.   Yes [provider]  loperamide (IMODIUM) 2 MG capsule Take 2 mg by mouth as needed for diarrhea or loose stools.   Yes [provider]  loratadine (CLARITIN) 10 MG tablet Take 10 mg by mouth daily.   Yes [provider]  OFEV 100 MG CAPS TAKE 1 CAPSULE BY MOUTH TWICE A DAY Patient taking differently: Take 100 mg by mouth 2 (two) times daily.  10/05/19  Yes Rigoberto Noel, MD   Allergies  Allergen Reactions  . Penicillins  Hives, Swelling and Rash    Has patient had a PCN reaction causing immediate rash, facial/tongue/throat swelling, SOB or lightheadedness with hypotension: Yes Has patient had a PCN reaction causing severe rash involving mucus membranes or skin necrosis: No Has patient had a PCN reaction that required hospitalization: No Has patient had a PCN reaction occurring within the last 10 years: No If all of the above answers are "NO", then may proceed with Cephalosporin use.     FAMILY HISTORY:  family history includes CAD in her father. SOCIAL HISTORY:  reports that she quit smoking about 7 years ago. Her smoking use included cigarettes. She has a 40.00 pack-year smoking history. She has never used smokeless tobacco. She reports current alcohol use of about 17.0 standard drinks of alcohol per week. She reports that she does not use drugs.  REVIEW OF SYSTEMS:   Constitutional: Negative for fever, chills, weight loss, malaise/fatigue and diaphoresis.  HENT: Negative for hearing loss, ear pain, nosebleeds, congestion, sore throat, neck pain, tinnitus and ear discharge.   Eyes: Negative for blurred vision, double vision, photophobia, pain, discharge and redness.  Respiratory: Negative for cough, hemoptysis, sputum production, wheezing and stridor.   Cardiovascular: Negative for chest pain, palpitations, orthopnea, claudication, leg swelling and PND.  Gastrointestinal: Negative for heartburn, nausea, vomiting, abdominal pain, diarrhea, constipation, blood in stool and melena.  Genitourinary: Negative for dysuria, urgency, frequency, hematuria and flank pain.  Musculoskeletal: Negative for myalgias, back pain, joint pain and falls.  Skin: Negative for itching and rash.  Neurological: Negative for dizziness, tingling, tremors, sensory change, speech change, focal weakness, seizures, loss of consciousness, weakness and headaches.  Endo/Heme/Allergies: Negative for environmental allergies and polydipsia.  Does not bruise/bleed easily.  SUBJECTIVE:   VITAL SIGNS: Temp:  [97.8 F (36.6 C)] 97.8 F (36.6 C) (12/17 1939) Pulse Rate:  [91-119] 108 (12/18 1100) Resp:  [13-34] 24 (12/18 1100) BP: (122-176)/(73-135) 157/81 (12/18 1100) SpO2:  [95 %-100 %] 97 % (12/18 1100)  PHYSICAL EXAMINATION: Gen. Pleasant, thin,frail, in no distress, anxious affect ENT - no pallor,icterus, no post nasal drip Neck: No JVD, no thyromegaly, no carotid bruits Lungs: no use of accessory muscles, no dullness to percussion, BL 1/3 rales no rhonchi  Cardiovascular: Rhythm regular, heart sounds  normal, no murmurs or gallops, no peripheral edema Abdomen: soft and non-tender, no hepatosplenomegaly, BS normal. Musculoskeletal: No deformities, no cyanosis or clubbing Neuro:  alert, non focal   Recent Labs  Lab 11/16/19 1947 11/17/19 0156  NA 120* 124*  K 3.6 3.5  CL 76* 80*  CO2 30 32  BUN 10 10  CREATININE 0.59 0.51  GLUCOSE 118* 86   Recent Labs  Lab 11/16/19 1947 11/17/19 0156  HGB 12.5 12.8  HCT 35.3* 36.7  WBC 11.9* 12.2*  PLT 477* 437*   DG Chest 1 View  Result Date: 11/17/2019 CLINICAL DATA:  Status post thoracentesis on the left EXAM: CHEST  1 VIEW COMPARISON:  November 17, 2019 radiograph obtained earlier in the day; PET-CT November 16, 2019 FINDINGS: There is a stable small left apical pneumothorax without tension component. Left pleural effusion is smaller following thoracentesis. There is underlying airspace consolidation in the left upper and mid lung regions. There is underlying fibrosis. No  new opacity is evident. Heart is upper normal in size with pulmonary vascularity within normal limits. There is aortic atherosclerosis. No appreciable adenopathy evident by radiography. Known bony metastases are not well seen by radiography. IMPRESSION: Stable left apical pneumothorax without appreciable tension component. Left pleural effusion smaller following thoracentesis. Airspace opacity in the  left upper to mid lung region remains. Underlying fibrosis. No new opacity. Stable cardiac silhouette. Aortic Atherosclerosis (ICD10-I70.0). Electronically Signed   By: Lowella Grip III M.D.   On: 11/17/2019 09:22   MR THORACIC SPINE W WO CONTRAST  Result Date: 11/17/2019 CLINICAL DATA:  Lung cancer. Abnormal uptake into the thoracic spine on the PET scan. EXAM: MRI THORACIC WITHOUT AND WITH CONTRAST TECHNIQUE: Multiplanar and multiecho pulse sequences of the thoracic spine were obtained without and with intravenous contrast. CONTRAST:  5.76mL GADAVIST GADOBUTROL 1 MMOL/ML IV SOLN COMPARISON:  PET scan 11/16/2019. CT of the chest 12/06/2017 and 12/07/2018 FINDINGS: MRI THORACIC SPINE FINDINGS Alignment: Slight degenerative retrolisthesis is present at T11-12 and T12-L1. No other significant listhesis is present. Levoconvex curvature is centered at T7-8. Vertebrae: Abnormal signal and enhancement is present at T3, T4, T5, and T6, consistent with metastatic disease to the spine. Enhancement occupies the posterior 2/3 of the vertebral body at T3 without significant extension in the pedicles. There is diffuse enhancement in the posterior 1/3 of the vertebral body at T4 without abnormal signal and enhancement extending into the left pedicle. More heterogeneous posterior enhancement is present at T5 and T6 with abnormal signal and enhancement extending into the left pedicle of both levels. No pathologic fractures are present. No definite extraosseous tumor or enhancement is present. No other focal areas of enhancement are present within the thoracic or visualized upper lumbar spine. The scout image demonstrates signal change at C6 and possibly C5, is consistent with additional metastases. There is no definite enhancement in this region. This likely represents more typical degenerative change. Cord:  Normal signal is present in the thoracic spinal cord. Paraspinal and other soft tissues: Bilateral pleural  effusions are present. Post obstructive disease is present in the left lung. Bilateral airspace opacities are noted. Complex cysts are again noted in the left kidney. Upper abdomen is otherwise unremarkable. Disc levels: T1-2: A broad-based disc protrusion is asymmetric to the right. Moderate right and mild left foraminal narrowing is present. T2-3: Broad-based disc bulging and facet disease contributes to mild foraminal narrowing bilaterally, right greater than left. T3-4: Facet spurring contributes to mild right foraminal narrowing. T4-5: No significant disc disease. Facet disease is worse on the right. T5-6: No significant disc disease. Facet hypertrophy is worse on the left. T6-7: A prominent right paramedian disc extrusion is present. Disc material extends 9 mm above the inferior endplate of T6. There is contact and slight distortion of the ventral surface the cord without abnormal signal. T7-8: A shallow central disc protrusion is present. No significant stenosis is present. T8-9: A left paramedian disc protrusion is present. There is effacement of ventral CSF without significant stenosis. T9-10: A far right lateral disc protrusion extends into the foramen with moderate right and mild left foraminal narrowing. T10-11: Facet spurring contributes to mild right foraminal narrowing. T11-12: A broad-based disc protrusion is present without significant stenosis. T12-L1: A broad-based disc protrusion is present. Mild foraminal narrowing is noted bilaterally. IMPRESSION: 1. Abnormal signal and enhancement at T3, T4, T5, and T6 consistent with metastatic disease to the spine. 2. No pathologic fractures are present. 3. Multilevel spondylosis of the lumbar spine  as described. 4. Bilateral lung disease and effusions, left greater than right. Please see PET scan from yesterday for further detail. 5. Complex left renal cysts are stable. Electronically Signed   By: San Morelle M.D.   On: 11/17/2019 05:43   NM PET  Image Restag (PS) Skull Base To Thigh  Result Date: 11/16/2019 CLINICAL DATA:  Subsequent treatment strategy for lung cancer. EXAM: NUCLEAR MEDICINE PET SKULL BASE TO THIGH TECHNIQUE: 5.7 mCi F-18 FDG was injected intravenously. Full-ring PET imaging was performed from the skull base to thigh after the radiotracer. CT data was obtained and used for attenuation correction and anatomic localization. Fasting blood glucose: 128 mg/dl COMPARISON:  05/24/2017 FINDINGS: Mediastinal blood pool activity: SUV max 2.4 Liver activity: SUV max NA NECK: No hypermetabolic lymph nodes in the neck. Incidental CT findings: none CHEST: Multiple areas of hypermetabolism are identified in the anterior left lung including a 3.3 x 2.1 cm consolidative opacity in the medial left upper lung adjacent to the mediastinum (see image 53/4) with SUV max = 5.6. Consolidative opacity seen relatively diffusely in the left parahilar lung shows hypermetabolic FDG uptake with SUV max = 5.6. Focal uptake in the inferior left hilum demonstrates SUV max = 5.1. Focal uptake in the region of the right atrium noted without underlying lesion evident on this noncontrast CT. There is some soft tissue attenuation anterior to the ascending aorta that stable since prior study. Bandlike opacity anterior right upper lobe (image 52/series 4) shows low level hypermetabolism with SUV max = 3.9. Focal hypermetabolism noted in the right hilum without lymphadenopathy evident by CT. Similar focal hypermetabolism identified inferior right hilum. There is a moderate left pleural fluid collection with gas identified in the left anterior pleural space. Low level diffuse FDG accumulation is identified along the left pleural surface. 9 mm short axis left supraclavicular node (34/4) demonstrates SUV max = 3.9. Focus of hypermetabolism identified in the left T11 paraspinal tissues without underlying mass lesion evident by CT. SUV max = 4.4. Incidental CT findings: There is  abdominal aortic atherosclerosis without aneurysm. Acute to subacute displaced fractures noted left lateral fifth rib and posterolateral left sixth rib. Extensive chronic interstitial/fibrotic lung disease noted bilaterally. ABDOMEN/PELVIS: No abnormal hypermetabolic activity within the liver, pancreas, adrenal glands, or spleen. No hypermetabolic lymph nodes in the abdomen or pelvis. Incidental CT findings: There is abdominal aortic atherosclerosis without aneurysm. Layering tiny gallstones evident. Multiple left renal cysts of varying size and attenuation some with layering calcific debris or hemorrhage. SKELETON: Hypermetabolism is identified in the posterior aspect of the T3 and T4 vertebral bodies although no underlying bony lesion is evident. SUV max = 8.4 at the level of T3. Incidental CT findings: Diffuse degenerative changes noted in the spine. Acute to subacute left rib fractures as above. Patient is status post right hip replacement. IMPRESSION: 1. Multiple areas of low level hypermetabolism associated with consolidative opacity in the left upper and lower lung with hypermetabolic left supraclavicular lymph node, focal hypermetabolism in the hilar regions and anterior mediastinum. Imaging features could be related to diffuse neoplasm (adenocarcinoma) with metastatic disease. Infectious/inflammatory etiology not excluded. 2. Moderate to large left hydropneumothorax, likely posttraumatic as there are acute to subacute displaced fractures of the left fourth and fifth ribs. 3. Bandlike opacity in the anterior right lung with low level hypermetabolism, indeterminate. 4. Hypermetabolic focus in the left 11 paraspinal tissues without underlying mass lesion evident by CT. 5. Hypermetabolic activity in the posterior T3 and T4 vertebral bodies without  associated mass lesion evident by CT. Thoracic spine MRI with and without contrast could be used to further evaluate as clinically warranted. 6. Advanced chronic  interstitial lung disease with fibrotic component. 7.  Aortic Atherosclerois (ICD10-170.0) These results will be called to the ordering clinician or representative by the Radiologist Assistant, and communication documented in the PACS or zVision Dashboard. Electronically Signed   By: Misty Stanley M.D.   On: 11/16/2019 15:09   DG CHEST PORT 1 VIEW  Result Date: 11/17/2019 CLINICAL DATA:  History of broken ribs and pneumothorax. Pleural effusion. EXAM: PORTABLE CHEST 1 VIEW COMPARISON:  PET CT 11/16/2019.  Chest x-ray 09/22/2017. FINDINGS: Mediastinum stable. Stable cardiomegaly. Dense left mid lung infiltrate/mass noted. Chronic interstitial prominence noted. Small left pleural effusion noted. Tiny left apical pneumothorax cannot be excluded. This appears decreased in size from prior PET-CT of 11/16/2019. Left posterior fifth and sixth rib fractures again noted. IMPRESSION: 1. Dense left mid lung field infiltrate/mass noted. Reference is made to recent PET-CT report of 11/16/2019. Chronic interstitial prominence noted. Small left pleural effusion noted. 2. Tiny residual left apical pneumothorax cannot be excluded. This appears decreased in size from prior PET-CT of 11/16/2019. Left posterior fifth and sixth rib fractures again noted. Electronically Signed   By: Marcello Moores  Register   On: 11/17/2019 07:43   ECHOCARDIOGRAM COMPLETE  Result Date: 11/17/2019   ECHOCARDIOGRAM REPORT   Patient Name:   Arli Linsley Date of Exam: 11/17/2019 Medical Rec #:  [id number]       Height:       62.0 in Accession #:    [id number]      Weight:       135.0 lb Date of Birth:  05-Aug-1941       BSA:          1.62 m Patient Age:    62 years        BP:           109/66 mmHg Patient Gender: F               HR:           73 bpm. Exam Location:  Inpatient Procedure: Limited Echo and Limited Color Doppler Indications:    CHF-Acute Diastolic 086.76 / P95.09  History:        Patient has prior history of Echocardiogram examinations, most                  recent 11/16/2019. Risk Factors:Hypertension and Dyslipidemia.                 Lung cancer, pulmonary fibrosis.  Sonographer:    Darlina Sicilian RDCS Referring Phys: [place]  1. Left ventricular ejection fraction, by visual estimation, is 60 to 65%. The left ventricle has normal function. Mildly increased left ventricular posterior wall thickness. There is severely increased left ventricular hypertrophy of the basal septum.  2. Global right ventricle has normal systolic function.The right ventricular size is normal. No increase in right ventricular wall thickness.  3. Left atrial size was normal.  4. Right atrial size was normal.  5. The mitral valve is normal in structure. No evidence of mitral valve regurgitation. No evidence of mitral stenosis.  6. The tricuspid valve is normal in structure. Tricuspid valve regurgitation is trivial.  7. The aortic valve is normal in structure. Aortic valve regurgitation is not visualized. No evidence of aortic valve sclerosis or stenosis.  8. The pulmonic valve was normal in  structure. Pulmonic valve regurgitation is not visualized.  9. Normal pulmonary artery systolic pressure. 10. The inferior vena cava is normal in size with greater than 50% respiratory variability, suggesting right atrial pressure of 3 mmHg. 11. The left ventricle has no regional wall motion abnormalities. 12. Left ventricular diastolic function could not be evaluated. FINDINGS  Left Ventricle: Left ventricular ejection fraction, by visual estimation, is 60 to 65%. The left ventricle has normal function. The left ventricle has no regional wall motion abnormalities. Mildly increased left ventricular posterior wall thickness. There is severely increased left ventricular hypertrophy. Left ventricular diastolic function could not be evaluated. Indeterminate filling pressures. Right Ventricle: The right ventricular size is normal. No increase in right ventricular wall thickness.  Global RV systolic function is has normal systolic function. The tricuspid regurgitant velocity is 2.50 m/s, and with an assumed right atrial pressure  of 3 mmHg, the estimated right ventricular systolic pressure is normal at 28.0 mmHg. Left Atrium: Left atrial size was normal in size. Right Atrium: Right atrial size was normal in size Pericardium: There is no evidence of pericardial effusion. Mitral Valve: The mitral valve is normal in structure. No evidence of mitral valve regurgitation. No evidence of mitral valve stenosis by observation. Tricuspid Valve: The tricuspid valve is normal in structure. Tricuspid valve regurgitation is trivial. Aortic Valve: The aortic valve is normal in structure. Aortic valve regurgitation is not visualized. The aortic valve is structurally normal, with no evidence of sclerosis or stenosis. Pulmonic Valve: The pulmonic valve was normal in structure. Pulmonic valve regurgitation is not visualized. Pulmonic regurgitation is not visualized. Aorta: The aortic root, ascending aorta and aortic arch are all structurally normal, with no evidence of dilitation or obstruction. Venous: The inferior vena cava is normal in size with greater than 50% respiratory variability, suggesting right atrial pressure of 3 mmHg. IAS/Shunts: No atrial level shunt detected by color flow Doppler. There is no evidence of a patent foramen ovale. No ventricular septal defect is seen or detected. There is no evidence of an atrial septal defect.  LEFT VENTRICLE PLAX 2D LVIDd:         2.90 cm LVIDs:         1.70 cm LV PW:         1.40 cm LV IVS:        1.70 cm LVOT diam:     1.70 cm LV SV:         24 ml LV SV Index:   14.45 LVOT Area:     2.27 cm  LEFT ATRIUM           Index LA diam:      2.60 cm 1.61 cm/m LA Vol (A2C): 34.3 ml 21.21 ml/m LA Vol (A4C): 42.4 ml 26.21 ml/m  TRICUSPID VALVE TR Peak grad:   25.0 mmHg TR Vmax:        250.00 cm/s  SHUNTS Systemic Diam: 1.70 cm  Fransico Him MD Electronically signed by  Fransico Him MD Signature Date/Time: 11/17/2019/11:50:50 AM    Final     ASSESSMENT / PLAN:  Unfortunately PET scan is suspicious and MRI of thoracic spine confirms metastases to thoracic spine.  Favor recurrence of lung cancer versus another primary. Pleural effusion Left hydropneumothorax  I reviewed PET scan results with radiology and compared to prior scans  Recommend Proceed with ultrasound-guided left thoracenteses -await fluid results including cytology If pleural fluid cytology is negative then can proceed with ultrasound-guided biopsy of left supraclavicular lymph node  Would obtain radiation oncology consult while in the hospital-she recently had an injection for C-spine and does not appear to be symptomatic for T-spine In terms of hospitalization, will repeat chest x-ray to see if left apical pneumothorax is resolved and if so, then she can discharge.  Alternatively if she stays in the hospital till Monday then we can obtain second procedure if needed  I have updated patient myself about possibility of metastatic cancer and her son Inocente Salles.  He would prefer if she stays in the hospital until we get a definitive diagnosis  Kara Mead MD. FCCP. [facility]  If no response to pager , please call [phone]     11/17/2019, 11:56 AM

## 2019-11-18 ENCOUNTER — Inpatient Hospital Stay (HOSPITAL_COMMUNITY): Payer: Medicare Other

## 2019-11-18 DIAGNOSIS — J90 Pleural effusion, not elsewhere classified: Secondary | ICD-10-CM

## 2019-11-18 DIAGNOSIS — C7951 Secondary malignant neoplasm of bone: Secondary | ICD-10-CM

## 2019-11-18 LAB — BASIC METABOLIC PANEL
Anion gap: 9 (ref 5–15)
BUN: 7 mg/dL — ABNORMAL LOW (ref 8–23)
CO2: 36 mmol/L — ABNORMAL HIGH (ref 22–32)
Calcium: 9.3 mg/dL (ref 8.9–10.3)
Chloride: 84 mmol/L — ABNORMAL LOW (ref 98–111)
Creatinine, Ser: 0.56 mg/dL (ref 0.44–1.00)
GFR calc Af Amer: >60 mL/min (ref >60)
GFR calc non Af Amer: >60 mL/min (ref >60)
Glucose, Bld: 115 mg/dL — ABNORMAL HIGH (ref 70–99)
Potassium: 3.1 mmol/L — ABNORMAL LOW (ref 3.5–5.1)
Sodium: 129 mmol/L — ABNORMAL LOW (ref 135–145)

## 2019-11-18 LAB — CBC
HCT: 35.6 % — ABNORMAL LOW (ref 36.0–46.0)
Hemoglobin: 12.4 g/dL (ref 12.0–15.0)
MCH: 32 pg (ref 26.0–34.0)
MCHC: 34.8 g/dL (ref 30.0–36.0)
MCV: 92 fL (ref 80.0–100.0)
Platelets: 403 10*3/uL — ABNORMAL HIGH (ref 150–400)
RBC: 3.87 MIL/uL (ref 3.87–5.11)
RDW: 12.6 % (ref 11.5–15.5)
WBC: 11.9 10*3/uL — ABNORMAL HIGH (ref 4.0–10.5)
nRBC: 0 % (ref 0.0–0.2)

## 2019-11-18 MED ORDER — POTASSIUM CHLORIDE CRYS ER 20 MEQ PO TBCR
40.0000 meq | EXTENDED_RELEASE_TABLET | Freq: Once | ORAL | Status: AC
  Administered 2019-11-18: 40 meq via ORAL
  Filled 2019-11-18: qty 2

## 2019-11-18 MED ORDER — CALCIUM CARBONATE ANTACID 500 MG PO CHEW: 1.0000 | CHEWABLE_TABLET | Freq: Two times a day (BID) | ORAL | Status: DC | PRN

## 2019-11-18 MED ORDER — LABETALOL HCL 5 MG/ML IV SOLN
10.0000 mg | Freq: Once | INTRAVENOUS | Status: AC
  Administered 2019-11-18: 10 mg via INTRAVENOUS
  Filled 2019-11-18 (×2): qty 4

## 2019-11-18 NOTE — Progress Notes (Signed)
   Vital Signs MEWS/VS Documentation      11/17/2019 2023 11/17/2019 2355 11/18/2019 0000 11/18/2019 0038   MEWS Score:  0  1  0  1   MEWS Score Color:  Green  Green  Green  Green   Resp:  19  (!) 21  20  13    Pulse:  90  (!) 101  70  96   BP:  --  140/90  (!) 179/82  --   Temp:  --  98.3 F (36.8 C)  97.7 F (36.5 C)  --   O2 Device:  --  --  Nasal Cannula  Nasal Cannula   O2 Flow Rate (L/min):  --  --  --  2 L/min   Level of Consciousness:  --  --  Alert  --         Pt BP was 170s/60s, so I adjusted BP cuff retook it. It was 166/70. Physician was notified and ordered IV labetalol. I retook the BP just before administering, and BP was back down in 140s/80s, so I held labetalol. Will continue to monitor.   [name] [name] 11/18/2019,1:47 AM

## 2019-11-18 NOTE — Progress Notes (Signed)
Name: [name] [name] MRN: [id number] DOB: 16-May-1941    ADMISSION DATE:  11/16/2019 CONSULTATION DATE:  11/18/2019   REFERRING MD :  Derrek Monaco  CHIEF COMPLAINT:  Abnormal PET scan   BRIEF PATIENT DESCRIPTION: 78 year old woman with prior history of lung cancer status post SBRT and IPF with chronic hypoxic respiratory failure on 4 L O2, admitted with abnormal PET scan showing left hydropneumothorax  SIGNIFICANT EVENTS  12/17 hospital admission  STUDIES:  PET 12/17 >> Multiple areas of low level hypermetabolism associated with consolidative opacity in the left upper and lower lung with hypermetabolic left supraclavicular lymph node, focal hypermetabolism in the hilar regions and anterior mediastinum. Hypermetabolic activity in the posterior T3 and T4 vertebral bodies without associated mass lesion evident by CT 12/18 MR T-spine >>Abnormal signal and enhancement at T3, T4, T5, and T6 consistent with metastatic disease to the spine  Left thoracentesis 12/18 >>   PMH - IPF and chronic hypoxic respiratory failure on 3 to 4 L oxygen. 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 Left lower lobe adenocarcinoma status post SBRT completed 01/29/2014.  Her last HRCT was 11/2018 which showed progression of UIP since 2019    SUBJECTIVE: Afebrile, able to walk around the room, no dyspnea or chest pain  VITAL SIGNS: Temp:  [97.7 F (36.5 C)-98.6 F (37 C)] 97.9 F (36.6 C) (12/19 0700) Pulse Rate:  [70-115] 94 (12/19 0900) Resp:  [13-30] 24 (12/19 0900) BP: (140-179)/(79-96) 155/79 (12/19 0900) SpO2:  [94 %-99 %] 94 % (12/19 0900) Weight:  [55.9 kg] 55.9 kg (12/19 0416)  PHYSICAL EXAMINATION: Gen. Pleasant, thin,frail, in no distress, anxious affect ENT - no pallor,icterus, no post nasal drip Neck: No  JVD, no thyromegaly, no carotid bruits Lungs: no use of accessory muscles, no dullness to percussion, BL 1/3 rales no rhonchi  Cardiovascular: Rhythm regular, heart sounds  normal, no murmurs or gallops, no peripheral edema Abdomen: soft and non-tender, no hepatosplenomegaly, BS normal. Musculoskeletal: No deformities, no cyanosis or clubbing Neuro:  alert, non focal  Pleural fluid results reviewed Chest x-ray 12/19 personally reviewed which shows unchanged small left apical pneumothorax    Recent Labs  Lab 11/16/19 1947 11/17/19 0156 11/18/19 0314  NA 120* 124* 129*  K 3.6 3.5 3.1*  CL 76* 80* 84*  CO2 30 32 36*  BUN 10 10 7*  CREATININE 0.59 0.51 0.56  GLUCOSE 118* 86 115*   Recent Labs  Lab 11/16/19 1947 11/17/19 0156 11/18/19 0314  HGB 12.5 12.8 12.4  HCT 35.3* 36.7 35.6*  WBC 11.9* 12.2* 11.9*  PLT 477* 437* 403*   DG Chest 1 View  Result Date: 11/17/2019 CLINICAL DATA:  Status post thoracentesis on the left EXAM: CHEST  1 VIEW COMPARISON:  November 17, 2019 radiograph obtained earlier in the day; PET-CT November 16, 2019 FINDINGS: There is a stable small left apical pneumothorax without tension component. Left pleural effusion is smaller following thoracentesis. There is underlying airspace consolidation in the left upper and mid lung regions. There is underlying fibrosis. No new opacity is evident. Heart is upper normal in size with pulmonary vascularity within normal limits. There is aortic atherosclerosis. No appreciable adenopathy evident by radiography. Known bony metastases are not well seen by radiography. IMPRESSION: Stable left apical pneumothorax without appreciable tension component. Left pleural effusion smaller following  thoracentesis. Airspace opacity in the left upper to mid lung region remains. Underlying fibrosis. No new opacity. Stable cardiac silhouette. Aortic Atherosclerosis (ICD10-I70.0). Electronically Signed   By: Lowella Grip III M.D.   On:  11/17/2019 09:22   MR THORACIC SPINE W WO CONTRAST  Result Date: 11/17/2019 CLINICAL DATA:  Lung cancer. Abnormal uptake into the thoracic spine on the PET scan. EXAM: MRI THORACIC WITHOUT AND WITH CONTRAST TECHNIQUE: Multiplanar and multiecho pulse sequences of the thoracic spine were obtained without and with intravenous contrast. CONTRAST:  5.40mL GADAVIST GADOBUTROL 1 MMOL/ML IV SOLN COMPARISON:  PET scan 11/16/2019. CT of the chest 12/06/2017 and 12/07/2018 FINDINGS: MRI THORACIC SPINE FINDINGS Alignment: Slight degenerative retrolisthesis is present at T11-12 and T12-L1. No other significant listhesis is present. Levoconvex curvature is centered at T7-8. Vertebrae: Abnormal signal and enhancement is present at T3, T4, T5, and T6, consistent with metastatic disease to the spine. Enhancement occupies the posterior 2/3 of the vertebral body at T3 without significant extension in the pedicles. There is diffuse enhancement in the posterior 1/3 of the vertebral body at T4 without abnormal signal and enhancement extending into the left pedicle. More heterogeneous posterior enhancement is present at T5 and T6 with abnormal signal and enhancement extending into the left pedicle of both levels. No pathologic fractures are present. No definite extraosseous tumor or enhancement is present. No other focal areas of enhancement are present within the thoracic or visualized upper lumbar spine. The scout image demonstrates signal change at C6 and possibly C5, is consistent with additional metastases. There is no definite enhancement in this region. This likely represents more typical degenerative change. Cord:  Normal signal is present in the thoracic spinal cord. Paraspinal and other soft tissues: Bilateral pleural effusions are present. Post obstructive disease is present in the left lung. Bilateral airspace opacities are noted. Complex cysts are again noted in the left kidney. Upper abdomen is otherwise unremarkable.  Disc levels: T1-2: A broad-based disc protrusion is asymmetric to the right. Moderate right and mild left foraminal narrowing is present. T2-3: Broad-based disc bulging and facet disease contributes to mild foraminal narrowing bilaterally, right greater than left. T3-4: Facet spurring contributes to mild right foraminal narrowing. T4-5: No significant disc disease. Facet disease is worse on the right. T5-6: No significant disc disease. Facet hypertrophy is worse on the left. T6-7: A prominent right paramedian disc extrusion is present. Disc material extends 9 mm above the inferior endplate of T6. There is contact and slight distortion of the ventral surface the cord without abnormal signal. T7-8: A shallow central disc protrusion is present. No significant stenosis is present. T8-9: A left paramedian disc protrusion is present. There is effacement of ventral CSF without significant stenosis. T9-10: A far right lateral disc protrusion extends into the foramen with moderate right and mild left foraminal narrowing. T10-11: Facet spurring contributes to mild right foraminal narrowing. T11-12: A broad-based disc protrusion is present without significant stenosis. T12-L1: A broad-based disc protrusion is present. Mild foraminal narrowing is noted bilaterally. IMPRESSION: 1. Abnormal signal and enhancement at T3, T4, T5, and T6 consistent with metastatic disease to the spine. 2. No pathologic fractures are present. 3. Multilevel spondylosis of the lumbar spine as described. 4. Bilateral lung disease and effusions, left greater than right. Please see PET scan from yesterday for further detail. 5. Complex left renal cysts are stable. Electronically Signed   By: San Morelle M.D.   On: 11/17/2019 05:43   DG CHEST PORT 1 VIEW  Result Date: 11/18/2019 CLINICAL DATA:  Hydropneumothorax. EXAM: PORTABLE CHEST 1 VIEW COMPARISON:  Chest x-ray from yesterday. FINDINGS: Stable cardiomediastinal silhouette. Unchanged small  left apical pneumothorax. Unchanged pleural thickening/fluid along the left lateral chest wall adjacent to displaced fifth and sixth rib fractures. Advanced chronic interstitial lung disease again noted. Unchanged consolidation in the left mid lung. No acute osseous abnormality. IMPRESSION: 1. Unchanged small left hydropneumothorax and consolidation in the left mid lung. Electronically Signed   By: Titus Dubin M.D.   On: 11/18/2019 09:37   DG CHEST PORT 1 VIEW  Result Date: 11/17/2019 CLINICAL DATA:  History of broken ribs and pneumothorax. Pleural effusion. EXAM: PORTABLE CHEST 1 VIEW COMPARISON:  PET CT 11/16/2019.  Chest x-ray 09/22/2017. FINDINGS: Mediastinum stable. Stable cardiomegaly. Dense left mid lung infiltrate/mass noted. Chronic interstitial prominence noted. Small left pleural effusion noted. Tiny left apical pneumothorax cannot be excluded. This appears decreased in size from prior PET-CT of 11/16/2019. Left posterior fifth and sixth rib fractures again noted. IMPRESSION: 1. Dense left mid lung field infiltrate/mass noted. Reference is made to recent PET-CT report of 11/16/2019. Chronic interstitial prominence noted. Small left pleural effusion noted. 2. Tiny residual left apical pneumothorax cannot be excluded. This appears decreased in size from prior PET-CT of 11/16/2019. Left posterior fifth and sixth rib fractures again noted. Electronically Signed   By: Marcello Moores  Register   On: 11/17/2019 07:43   ECHOCARDIOGRAM COMPLETE  Result Date: 11/17/2019   ECHOCARDIOGRAM REPORT   Patient Name:   Seriah Reid Date of Exam: 11/17/2019 Medical Rec #:  [id number]       Height:       62.0 in Accession #:    [id number]      Weight:       135.0 lb Date of Birth:  March 08, 1941       BSA:          1.62 m Patient Age:    9 years        BP:           109/66 mmHg Patient Gender: F               HR:           73 bpm. Exam Location:  Inpatient Procedure: Limited Echo and Limited Color Doppler Indications:     CHF-Acute Diastolic 109.32 / T55.73  History:        Patient has prior history of Echocardiogram examinations, most                 recent 11/16/2019. Risk Factors:Hypertension and Dyslipidemia.                 Lung cancer, pulmonary fibrosis.  Sonographer:    Darlina Sicilian RDCS Referring Phys: Harper  1. Left ventricular ejection fraction, by visual estimation, is 60 to 65%. The left ventricle has normal function. Mildly increased left ventricular posterior wall thickness. There is severely increased left ventricular hypertrophy of the basal septum.  2. Global right ventricle has normal systolic function.The right ventricular size is normal. No increase in right ventricular wall thickness.  3. Left atrial size was normal.  4. Right atrial size was normal.  5. The mitral valve is normal in structure. No evidence of mitral valve regurgitation. No evidence of mitral stenosis.  6. The tricuspid valve is normal in structure. Tricuspid valve regurgitation is trivial.  7. The aortic valve is normal in structure. Aortic valve regurgitation is not visualized. No  evidence of aortic valve sclerosis or stenosis.  8. The pulmonic valve was normal in structure. Pulmonic valve regurgitation is not visualized.  9. Normal pulmonary artery systolic pressure. 10. The inferior vena cava is normal in size with greater than 50% respiratory variability, suggesting right atrial pressure of 3 mmHg. 11. The left ventricle has no regional wall motion abnormalities. 12. Left ventricular diastolic function could not be evaluated. FINDINGS  Left Ventricle: Left ventricular ejection fraction, by visual estimation, is 60 to 65%. The left ventricle has normal function. The left ventricle has no regional wall motion abnormalities. Mildly increased left ventricular posterior wall thickness. There is severely increased left ventricular hypertrophy. Left ventricular diastolic function could not be evaluated. Indeterminate  filling pressures. Right Ventricle: The right ventricular size is normal. No increase in right ventricular wall thickness. Global RV systolic function is has normal systolic function. The tricuspid regurgitant velocity is 2.50 m/s, and with an assumed right atrial pressure  of 3 mmHg, the estimated right ventricular systolic pressure is normal at 28.0 mmHg. Left Atrium: Left atrial size was normal in size. Right Atrium: Right atrial size was normal in size Pericardium: There is no evidence of pericardial effusion. Mitral Valve: The mitral valve is normal in structure. No evidence of mitral valve regurgitation. No evidence of mitral valve stenosis by observation. Tricuspid Valve: The tricuspid valve is normal in structure. Tricuspid valve regurgitation is trivial. Aortic Valve: The aortic valve is normal in structure. Aortic valve regurgitation is not visualized. The aortic valve is structurally normal, with no evidence of sclerosis or stenosis. Pulmonic Valve: The pulmonic valve was normal in structure. Pulmonic valve regurgitation is not visualized. Pulmonic regurgitation is not visualized. Aorta: The aortic root, ascending aorta and aortic arch are all structurally normal, with no evidence of dilitation or obstruction. Venous: The inferior vena cava is normal in size with greater than 50% respiratory variability, suggesting right atrial pressure of 3 mmHg. IAS/Shunts: No atrial level shunt detected by color flow Doppler. There is no evidence of a patent foramen ovale. No ventricular septal defect is seen or detected. There is no evidence of an atrial septal defect.  LEFT VENTRICLE PLAX 2D LVIDd:         2.90 cm LVIDs:         1.70 cm LV PW:         1.40 cm LV IVS:        1.70 cm LVOT diam:     1.70 cm LV SV:         24 ml LV SV Index:   14.45 LVOT Area:     2.27 cm  LEFT ATRIUM           Index LA diam:      2.60 cm 1.61 cm/m LA Vol (A2C): 34.3 ml 21.21 ml/m LA Vol (A4C): 42.4 ml 26.21 ml/m  TRICUSPID VALVE TR  Peak grad:   25.0 mmHg TR Vmax:        250.00 cm/s  SHUNTS Systemic Diam: 1.70 cm  Fransico Him MD Electronically signed by Fransico Him MD Signature Date/Time: 11/17/2019/11:50:50 AM    Final    IR THORACENTESIS ASP PLEURAL SPACE W/IMG GUIDE  Result Date: 11/17/2019 INDICATION: History of lung cancer. Shortness of breath. Left hydropneumothorax seen on recent imaging. Request diagnostic and therapeutic thoracentesis. EXAM: ULTRASOUND GUIDED LEFT THORACENTESIS MEDICATIONS: None. COMPLICATIONS: None immediate. PROCEDURE: An ultrasound guided thoracentesis was thoroughly discussed with the patient and questions answered. The benefits, risks, alternatives and complications  were also discussed. The patient understands and wishes to proceed with the procedure. Written consent was obtained. Ultrasound was performed to localize and mark an adequate pocket of fluid in the left chest. The area was then prepped and draped in the normal sterile fashion. 1% Lidocaine was used for local anesthesia. Under ultrasound guidance a 6 Fr Safe-T-Centesis catheter was introduced. Thoracentesis was performed. The catheter was removed and a dressing applied. FINDINGS: A total of approximately 470 mL of serosanguineous fluid was removed. Samples were sent to the laboratory as requested by the clinical team. IMPRESSION: Successful ultrasound guided left thoracentesis yielding 470 mL of pleural fluid. Read by: Ascencion Dike PA-C Electronically Signed   By: Aletta Edouard M.D.   On: 11/17/2019 09:28    ASSESSMENT / PLAN:  Metastatic lung cancer - favor recurrence of lung cancer versus another primary. Pleural effusion, exudative, likely malignant Left hydropneumothorax   Recommend Await pleural fluid cytology If pleural fluid cytology is negative then can proceed with ultrasound-guided biopsy of left supraclavicular lymph node Appreciate radiation oncology input  IPF -continue O FEV100 twice daily Chronic hypoxic  respiratory failure-O2 2 to 4 L  Hypokalemia being repleted Hyponatremia likely due to lung malignancy Patient and her family prefer to wait in the hospital until definitive diagnosis  Kara Mead MD. FCCP. [facility]  If no response to pager , please call [phone]     11/18/2019, 2:30 PM

## 2019-11-18 NOTE — Progress Notes (Signed)
PROGRESS NOTE    [name] [name]  PJA:[id number] DOB: [birth year] DOA: 11/16/2019 PCP: Alroy Dust, L.Marlou Sa, MD     Brief Narrative:  [name] [name] is a 78 y.o. 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 Elsworth Soho who presents secondary to PET scan finding of hydropneumothorax.  PET scan obtained as an outpatient revealed multiple areas of hypermetabolism associated with consolidative opacity in the left lung with hypermetabolic left supraclavicular lymph node.  Also revealed moderate to large left hydropneumothorax.  New events last 24 hours / Subjective: States that her breathing is much better today.  Assessment & Plan:   Principal Problem:   Hydropneumothorax Active Problems:   Pulmonary fibrosis (HCC)   Hypertension   Idiopathic pulmonary fibrosis (HCC)   Dyspnea   Acute on chronic hypoxemic respiratory failure secondary to hydropneumothorax -Chronically on 2L [place] O2 at baseline   -Covid screening negative -Status post thoracentesis 12/18. Stable left apical pneumothorax s/p thoracentesis -Await pleural studies, cytology.  By Light's criteria, fluid is exudative in nature -Repeat CXR 12/19 shows unchanged small left hydropneumothorax and consolidation in the left midlung -PCCM following   Concern for metastatic disease with history of lung cancer in the past -PET: multiple areas of low level hypermetabolism associated with consolidative opacity in the left upper and lower lung with hypermetabolic left supraclavicular lymph node, focal hypermetabolism in the hilar regions and anterior mediastinum. -MRI thoracic spine: abnormal signal and enhancement at T3, T4, T5, and T6 consistent with metastatic disease to the  spine -Radiation oncology consulted; they have set up an outpatient appointment for 12/29 as well as arranging for follow-up with Dr. Julien Nordmann as an outpatient as well  Hyponatremia -Hold HCTZ -Improving, monitor BMP   Acute/subacute rib fracture -IS   ILD/IPF -Ofev   Essential hypertension -HCTZ held due to hyponatremia -Amlodipine  Hyperlipidemia -Lipitor  Hypokalemia -Replace, trend   DVT prophylaxis: Lovenox  Code Status: Full Family Communication: None at bedside Disposition Plan: Pending results from pleural study   Consultants:   Pulmonology  Radiation oncology  Procedures:   Left thoracentesis by IR 12/18  Antimicrobials:  Anti-infectives (From admission, onward)   None       Objective: Vitals:   11/18/19 0000 11/18/19 0038 11/18/19 0416 11/18/19 0700  BP: (!) 179/82 (!) 141/84  (!) 153/96  Pulse: 70 96  98  Resp: 20 13  19   Temp: 97.7 F (36.5 C) 97.8 F (36.6 C)  97.9 F (36.6 C)  TempSrc: Oral Oral  Oral  SpO2: 99% 98%  99%  Weight:   55.9 kg   Height:        Intake/Output Summary (Last 24 hours) at 11/18/2019 0944 Last data filed at 11/17/2019 2100 Gross per 24 hour  Intake 480 ml  Output --  Net 480 ml   Filed Weights   11/17/19 1220 11/18/19 0416  Weight: 55 kg 55.9 kg    Examination: General exam: Appears calm and comfortable  Respiratory system: Diffuse crackles bilaterally.  On 2 L nasal cannula O2 without conversational dyspnea.  Respiratory effort normal. Cardiovascular system: S1 & S2 heard, RRR. No pedal edema. Gastrointestinal system: Abdomen is nondistended, soft and nontender. Normal bowel sounds heard. Central nervous system: Alert and oriented. Non focal exam. Speech clear  Extremities: Symmetric in  appearance bilaterally  Skin: No rashes, lesions or ulcers on exposed skin  Psychiatry: Judgement and insight appear stable. Mood & affect appropriate.    Data Reviewed: I have personally reviewed following labs  and imaging studies  CBC: Recent Labs  Lab 11/16/19 1947 11/17/19 0156 11/18/19 0314  WBC 11.9* 12.2* 11.9*  HGB 12.5 12.8 12.4  HCT 35.3* 36.7 35.6*  MCV 89.6 92.2 92.0  PLT 477* 437* 993*   Basic Metabolic Panel: Recent Labs  Lab 11/16/19 1947 11/17/19 0156 11/18/19 0314  NA 120* 124* 129*  K 3.6 3.5 3.1*  CL 76* 80* 84*  CO2 30 32 36*  GLUCOSE 118* 86 115*  BUN 10 10 7*  CREATININE 0.59 0.51 0.56  CALCIUM 9.4 9.4 9.3   GFR: Estimated Creatinine Clearance: 47.3 mL/min (by C-G formula based on SCr of 0.56 mg/dL). Liver Function Tests: Recent Labs  Lab 11/17/19 0156  AST 21  ALT 12  ALKPHOS 54  BILITOT 0.5  PROT 5.8*  ALBUMIN 2.9*   No results for input(s): LIPASE, AMYLASE in the last 168 hours. No results for input(s): AMMONIA in the last 168 hours. Coagulation Profile: Recent Labs  Lab 11/17/19 0156  INR 1.0   Cardiac Enzymes: No results for input(s): CKTOTAL, CKMB, CKMBINDEX, TROPONINI in the last 168 hours. BNP (last 3 results) No results for input(s): PROBNP in the last 8760 hours. HbA1C: No results for input(s): HGBA1C in the last 72 hours. CBG: Recent Labs  Lab 11/16/19 1150  GLUCAP 128*   Lipid Profile: No results for input(s): CHOL, HDL, LDLCALC, TRIG, CHOLHDL, LDLDIRECT in the last 72 hours. Thyroid Function Tests: Recent Labs    11/17/19 1222  TSH 2.429   Anemia Panel: No results for input(s): VITAMINB12, FOLATE, FERRITIN, TIBC, IRON, RETICCTPCT in the last 72 hours. Sepsis Labs: No results for input(s): PROCALCITON, LATICACIDVEN in the last 168 hours.  Recent Results (from the past 240 hour(s))  SARS CORONAVIRUS 2 (TAT 6-24 HRS) Nasopharyngeal Nasopharyngeal Swab     Status: None   Collection Time: 11/16/19 10:08 PM   Specimen: Nasopharyngeal Swab  Result Value Ref Range Status   SARS Coronavirus 2 NEGATIVE NEGATIVE Final    Comment: (NOTE) SARS-CoV-2 target nucleic acids are NOT DETECTED. The SARS-CoV-2 RNA is generally  detectable in upper and lower respiratory specimens during the acute phase of infection. Negative results do not preclude SARS-CoV-2 infection, do not rule out co-infections with other pathogens, and should not be used as the sole basis for treatment or other patient management decisions. Negative results must be combined with clinical observations, patient history, and epidemiological information. The expected result is Negative. Fact Sheet for Patients: SugarRoll.be Fact Sheet for Healthcare Providers: https://www.woods-mathews.com/ This test is not yet approved or cleared by the [place] FDA and  has been authorized for detection and/or diagnosis of SARS-CoV-2 by FDA under an Emergency Use Authorization (EUA). This EUA will remain  in effect (meaning this test can be used) for the duration of the COVID-19 declaration under Section 56 4(b)(1) of the Act, 21 U.S.C. section 360bbb-3(b)(1), unless the authorization is terminated or revoked sooner. Performed at [facility], [place] [address], [place], [place] [address]   Body fluid culture     Status: None (Preliminary result)   Collection Time: 11/17/19  9:36 AM   Specimen: Lung, Left; Pleural Fluid  Result Value Ref Range Status   Specimen Description PLEURAL LEFT  Final   Special Requests NONE  Final  Gram Stain   Final    RARE WBC PRESENT, PREDOMINANTLY MONONUCLEAR NO ORGANISMS SEEN Performed at [facility] [address], [place], [place] [address]    Culture PENDING  Incomplete   Report Status PENDING  Incomplete  MRSA PCR Screening     Status: None   Collection Time: 11/17/19  6:02 PM   Specimen: Nasopharyngeal  Result Value Ref Range Status   MRSA by PCR NEGATIVE NEGATIVE Final    Comment:        The GeneXpert MRSA Assay (FDA approved for NASAL specimens only), is one component of a comprehensive MRSA colonization surveillance program. It is not intended to  diagnose MRSA infection nor to guide or monitor treatment for MRSA infections. Performed at [facility], [place] [address], [place], [place] [address]       Radiology Studies: DG Chest 1 View  Result Date: 11/17/2019 CLINICAL DATA:  Status post thoracentesis on the left EXAM: CHEST  1 VIEW COMPARISON:  November 17, 2019 radiograph obtained earlier in the day; PET-CT November 16, 2019 FINDINGS: There is a stable small left apical pneumothorax without tension component. Left pleural effusion is smaller following thoracentesis. There is underlying airspace consolidation in the left upper and mid lung regions. There is underlying fibrosis. No new opacity is evident. Heart is upper normal in size with pulmonary vascularity within normal limits. There is aortic atherosclerosis. No appreciable adenopathy evident by radiography. Known bony metastases are not well seen by radiography. IMPRESSION: Stable left apical pneumothorax without appreciable tension component. Left pleural effusion smaller following thoracentesis. Airspace opacity in the left upper to mid lung region remains. Underlying fibrosis. No new opacity. Stable cardiac silhouette. Aortic Atherosclerosis (ICD10-I70.0). Electronically Signed   By: Lowella Grip III M.D.   On: 11/17/2019 09:22   MR THORACIC SPINE W WO CONTRAST  Result Date: 11/17/2019 CLINICAL DATA:  Lung cancer. Abnormal uptake into the thoracic spine on the PET scan. EXAM: MRI THORACIC WITHOUT AND WITH CONTRAST TECHNIQUE: Multiplanar and multiecho pulse sequences of the thoracic spine were obtained without and with intravenous contrast. CONTRAST:  5.44mL GADAVIST GADOBUTROL 1 MMOL/ML IV SOLN COMPARISON:  PET scan 11/16/2019. CT of the chest 12/06/2017 and 12/07/2018 FINDINGS: MRI THORACIC SPINE FINDINGS Alignment: Slight degenerative retrolisthesis is present at T11-12 and T12-L1. No other significant listhesis is present. Levoconvex curvature is centered at T7-8.  Vertebrae: Abnormal signal and enhancement is present at T3, T4, T5, and T6, consistent with metastatic disease to the spine. Enhancement occupies the posterior 2/3 of the vertebral body at T3 without significant extension in the pedicles. There is diffuse enhancement in the posterior 1/3 of the vertebral body at T4 without abnormal signal and enhancement extending into the left pedicle. More heterogeneous posterior enhancement is present at T5 and T6 with abnormal signal and enhancement extending into the left pedicle of both levels. No pathologic fractures are present. No definite extraosseous tumor or enhancement is present. No other focal areas of enhancement are present within the thoracic or visualized upper lumbar spine. The scout image demonstrates signal change at C6 and possibly C5, is consistent with additional metastases. There is no definite enhancement in this region. This likely represents more typical degenerative change. Cord:  Normal signal is present in the thoracic spinal cord. Paraspinal and other soft tissues: Bilateral pleural effusions are present. Post obstructive disease is present in the left lung. Bilateral airspace opacities are noted. Complex cysts are again noted in the left kidney. Upper abdomen  is otherwise unremarkable. Disc levels: T1-2: A broad-based disc protrusion is asymmetric to the right. Moderate right and mild left foraminal narrowing is present. T2-3: Broad-based disc bulging and facet disease contributes to mild foraminal narrowing bilaterally, right greater than left. T3-4: Facet spurring contributes to mild right foraminal narrowing. T4-5: No significant disc disease. Facet disease is worse on the right. T5-6: No significant disc disease. Facet hypertrophy is worse on the left. T6-7: A prominent right paramedian disc extrusion is present. Disc material extends 9 mm above the inferior endplate of T6. There is contact and slight distortion of the ventral surface the cord  without abnormal signal. T7-8: A shallow central disc protrusion is present. No significant stenosis is present. T8-9: A left paramedian disc protrusion is present. There is effacement of ventral CSF without significant stenosis. T9-10: A far right lateral disc protrusion extends into the foramen with moderate right and mild left foraminal narrowing. T10-11: Facet spurring contributes to mild right foraminal narrowing. T11-12: A broad-based disc protrusion is present without significant stenosis. T12-L1: A broad-based disc protrusion is present. Mild foraminal narrowing is noted bilaterally. IMPRESSION: 1. Abnormal signal and enhancement at T3, T4, T5, and T6 consistent with metastatic disease to the spine. 2. No pathologic fractures are present. 3. Multilevel spondylosis of the lumbar spine as described. 4. Bilateral lung disease and effusions, left greater than right. Please see PET scan from yesterday for further detail. 5. Complex left renal cysts are stable. Electronically Signed   By: San Morelle M.D.   On: 11/17/2019 05:43   NM PET Image Restag (PS) Skull Base To Thigh  Result Date: 11/16/2019 CLINICAL DATA:  Subsequent treatment strategy for lung cancer. EXAM: NUCLEAR MEDICINE PET SKULL BASE TO THIGH TECHNIQUE: 5.7 mCi F-18 FDG was injected intravenously. Full-ring PET imaging was performed from the skull base to thigh after the radiotracer. CT data was obtained and used for attenuation correction and anatomic localization. Fasting blood glucose: 128 mg/dl COMPARISON:  05/24/2017 FINDINGS: Mediastinal blood pool activity: SUV max 2.4 Liver activity: SUV max NA NECK: No hypermetabolic lymph nodes in the neck. Incidental CT findings: none CHEST: Multiple areas of hypermetabolism are identified in the anterior left lung including a 3.3 x 2.1 cm consolidative opacity in the medial left upper lung adjacent to the mediastinum (see image 53/4) with SUV max = 5.6. Consolidative opacity seen relatively  diffusely in the left parahilar lung shows hypermetabolic FDG uptake with SUV max = 5.6. Focal uptake in the inferior left hilum demonstrates SUV max = 5.1. Focal uptake in the region of the right atrium noted without underlying lesion evident on this noncontrast CT. There is some soft tissue attenuation anterior to the ascending aorta that stable since prior study. Bandlike opacity anterior right upper lobe (image 52/series 4) shows low level hypermetabolism with SUV max = 3.9. Focal hypermetabolism noted in the right hilum without lymphadenopathy evident by CT. Similar focal hypermetabolism identified inferior right hilum. There is a moderate left pleural fluid collection with gas identified in the left anterior pleural space. Low level diffuse FDG accumulation is identified along the left pleural surface. 9 mm short axis left supraclavicular node (34/4) demonstrates SUV max = 3.9. Focus of hypermetabolism identified in the left T11 paraspinal tissues without underlying mass lesion evident by CT. SUV max = 4.4. Incidental CT findings: There is abdominal aortic atherosclerosis without aneurysm. Acute to subacute displaced fractures noted left lateral fifth rib and posterolateral left sixth rib. Extensive chronic interstitial/fibrotic lung disease noted bilaterally.  ABDOMEN/PELVIS: No abnormal hypermetabolic activity within the liver, pancreas, adrenal glands, or spleen. No hypermetabolic lymph nodes in the abdomen or pelvis. Incidental CT findings: There is abdominal aortic atherosclerosis without aneurysm. Layering tiny gallstones evident. Multiple left renal cysts of varying size and attenuation some with layering calcific debris or hemorrhage. SKELETON: Hypermetabolism is identified in the posterior aspect of the T3 and T4 vertebral bodies although no underlying bony lesion is evident. SUV max = 8.4 at the level of T3. Incidental CT findings: Diffuse degenerative changes noted in the spine. Acute to subacute  left rib fractures as above. Patient is status post right hip replacement. IMPRESSION: 1. Multiple areas of low level hypermetabolism associated with consolidative opacity in the left upper and lower lung with hypermetabolic left supraclavicular lymph node, focal hypermetabolism in the hilar regions and anterior mediastinum. Imaging features could be related to diffuse neoplasm (adenocarcinoma) with metastatic disease. Infectious/inflammatory etiology not excluded. 2. Moderate to large left hydropneumothorax, likely posttraumatic as there are acute to subacute displaced fractures of the left fourth and fifth ribs. 3. Bandlike opacity in the anterior right lung with low level hypermetabolism, indeterminate. 4. Hypermetabolic focus in the left 11 paraspinal tissues without underlying mass lesion evident by CT. 5. Hypermetabolic activity in the posterior T3 and T4 vertebral bodies without associated mass lesion evident by CT. Thoracic spine MRI with and without contrast could be used to further evaluate as clinically warranted. 6. Advanced chronic interstitial lung disease with fibrotic component. 7.  Aortic Atherosclerois (ICD10-170.0) These results will be called to the ordering clinician or representative by the Radiologist Assistant, and communication documented in the PACS or zVision Dashboard. Electronically Signed   By: Misty Stanley M.D.   On: 11/16/2019 15:09   DG CHEST PORT 1 VIEW  Result Date: 11/18/2019 CLINICAL DATA:  Hydropneumothorax. EXAM: PORTABLE CHEST 1 VIEW COMPARISON:  Chest x-ray from yesterday. FINDINGS: Stable cardiomediastinal silhouette. Unchanged small left apical pneumothorax. Unchanged pleural thickening/fluid along the left lateral chest wall adjacent to displaced fifth and sixth rib fractures. Advanced chronic interstitial lung disease again noted. Unchanged consolidation in the left mid lung. No acute osseous abnormality. IMPRESSION: 1. Unchanged small left hydropneumothorax and  consolidation in the left mid lung. Electronically Signed   By: Titus Dubin M.D.   On: 11/18/2019 09:37   DG CHEST PORT 1 VIEW  Result Date: 11/17/2019 CLINICAL DATA:  History of broken ribs and pneumothorax. Pleural effusion. EXAM: PORTABLE CHEST 1 VIEW COMPARISON:  PET CT 11/16/2019.  Chest x-ray 09/22/2017. FINDINGS: Mediastinum stable. Stable cardiomegaly. Dense left mid lung infiltrate/mass noted. Chronic interstitial prominence noted. Small left pleural effusion noted. Tiny left apical pneumothorax cannot be excluded. This appears decreased in size from prior PET-CT of 11/16/2019. Left posterior fifth and sixth rib fractures again noted. IMPRESSION: 1. Dense left mid lung field infiltrate/mass noted. Reference is made to recent PET-CT report of 11/16/2019. Chronic interstitial prominence noted. Small left pleural effusion noted. 2. Tiny residual left apical pneumothorax cannot be excluded. This appears decreased in size from prior PET-CT of 11/16/2019. Left posterior fifth and sixth rib fractures again noted. Electronically Signed   By: Marcello Moores  Register   On: 11/17/2019 07:43   ECHOCARDIOGRAM COMPLETE  Result Date: 11/17/2019   ECHOCARDIOGRAM REPORT   Patient Name:   [name] [name] Date of Exam: 11/17/2019 Medical Rec #:  [id number]       Height:       62.0 in Accession #:    [id number]  Weight:       135.0 lb Date of Birth:  [birth year]       BSA:          1.62 m Patient Age:    29 years        BP:           109/66 mmHg Patient Gender: F               HR:           73 bpm. Exam Location:  Inpatient Procedure: Limited Echo and Limited Color Doppler Indications:    CHF-Acute Diastolic 161.09 / U04.54  History:        Patient has prior history of Echocardiogram examinations, most                 recent 11/16/2019. Risk Factors:Hypertension and Dyslipidemia.                 Lung cancer, pulmonary fibrosis.  Sonographer:    Darlina Sicilian RDCS Referring Phys: [place]  1. Left  ventricular ejection fraction, by visual estimation, is 60 to 65%. The left ventricle has normal function. Mildly increased left ventricular posterior wall thickness. There is severely increased left ventricular hypertrophy of the basal septum.  2. Global right ventricle has normal systolic function.The right ventricular size is normal. No increase in right ventricular wall thickness.  3. Left atrial size was normal.  4. Right atrial size was normal.  5. The mitral valve is normal in structure. No evidence of mitral valve regurgitation. No evidence of mitral stenosis.  6. The tricuspid valve is normal in structure. Tricuspid valve regurgitation is trivial.  7. The aortic valve is normal in structure. Aortic valve regurgitation is not visualized. No evidence of aortic valve sclerosis or stenosis.  8. The pulmonic valve was normal in structure. Pulmonic valve regurgitation is not visualized.  9. Normal pulmonary artery systolic pressure. 10. The inferior vena cava is normal in size with greater than 50% respiratory variability, suggesting right atrial pressure of 3 mmHg. 11. The left ventricle has no regional wall motion abnormalities. 12. Left ventricular diastolic function could not be evaluated. FINDINGS  Left Ventricle: Left ventricular ejection fraction, by visual estimation, is 60 to 65%. The left ventricle has normal function. The left ventricle has no regional wall motion abnormalities. Mildly increased left ventricular posterior wall thickness. There is severely increased left ventricular hypertrophy. Left ventricular diastolic function could not be evaluated. Indeterminate filling pressures. Right Ventricle: The right ventricular size is normal. No increase in right ventricular wall thickness. Global RV systolic function is has normal systolic function. The tricuspid regurgitant velocity is 2.50 m/s, and with an assumed right atrial pressure  of 3 mmHg, the estimated right ventricular systolic pressure is  normal at 28.0 mmHg. Left Atrium: Left atrial size was normal in size. Right Atrium: Right atrial size was normal in size Pericardium: There is no evidence of pericardial effusion. Mitral Valve: The mitral valve is normal in structure. No evidence of mitral valve regurgitation. No evidence of mitral valve stenosis by observation. Tricuspid Valve: The tricuspid valve is normal in structure. Tricuspid valve regurgitation is trivial. Aortic Valve: The aortic valve is normal in structure. Aortic valve regurgitation is not visualized. The aortic valve is structurally normal, with no evidence of sclerosis or stenosis. Pulmonic Valve: The pulmonic valve was normal in structure. Pulmonic valve regurgitation is not visualized. Pulmonic regurgitation is not visualized. Aorta: The aortic root, ascending  aorta and aortic arch are all structurally normal, with no evidence of dilitation or obstruction. Venous: The inferior vena cava is normal in size with greater than 50% respiratory variability, suggesting right atrial pressure of 3 mmHg. IAS/Shunts: No atrial level shunt detected by color flow Doppler. There is no evidence of a patent foramen ovale. No ventricular septal defect is seen or detected. There is no evidence of an atrial septal defect.  LEFT VENTRICLE PLAX 2D LVIDd:         2.90 cm LVIDs:         1.70 cm LV PW:         1.40 cm LV IVS:        1.70 cm LVOT diam:     1.70 cm LV SV:         24 ml LV SV Index:   14.45 LVOT Area:     2.27 cm  LEFT ATRIUM           Index LA diam:      2.60 cm 1.61 cm/m LA Vol (A2C): 34.3 ml 21.21 ml/m LA Vol (A4C): 42.4 ml 26.21 ml/m  TRICUSPID VALVE TR Peak grad:   25.0 mmHg TR Vmax:        250.00 cm/s  SHUNTS Systemic Diam: 1.70 cm  Fransico Him MD Electronically signed by Fransico Him MD Signature Date/Time: 11/17/2019/11:50:50 AM    Final    IR THORACENTESIS ASP PLEURAL SPACE W/IMG GUIDE  Result Date: 11/17/2019 INDICATION: History of lung cancer. Shortness of breath. Left  hydropneumothorax seen on recent imaging. Request diagnostic and therapeutic thoracentesis. EXAM: ULTRASOUND GUIDED LEFT THORACENTESIS MEDICATIONS: None. COMPLICATIONS: None immediate. PROCEDURE: An ultrasound guided thoracentesis was thoroughly discussed with the patient and questions answered. The benefits, risks, alternatives and complications were also discussed. The patient understands and wishes to proceed with the procedure. Written consent was obtained. Ultrasound was performed to localize and mark an adequate pocket of fluid in the left chest. The area was then prepped and draped in the normal sterile fashion. 1% Lidocaine was used for local anesthesia. Under ultrasound guidance a 6 Fr Safe-T-Centesis catheter was introduced. Thoracentesis was performed. The catheter was removed and a dressing applied. FINDINGS: A total of approximately 470 mL of serosanguineous fluid was removed. Samples were sent to the laboratory as requested by the clinical team. IMPRESSION: Successful ultrasound guided left thoracentesis yielding 470 mL of pleural fluid. Read by: Ascencion Dike PA-C Electronically Signed   By: Aletta Edouard M.D.   On: 11/17/2019 09:28      Scheduled Meds: . amLODipine  10 mg Oral Daily  . atorvastatin  20 mg Oral q morning - 10a  . enoxaparin (LOVENOX) injection  40 mg Subcutaneous Q24H  . feeding supplement (ENSURE ENLIVE)  237 mL Oral BID BM  . loratadine  10 mg Oral Daily  . Nintedanib  100 mg Oral BID  . polyvinyl alcohol  1 drop Both Eyes Daily   Continuous Infusions:   LOS: 2 days      Time spent: 25 minutes   Dessa Phi, DO [facility] 11/18/2019, 9:44 AM   Available via Epic secure chat 7am-7pm After these hours, please refer to coverage provider listed on [email or web address]

## 2019-11-19 LAB — BASIC METABOLIC PANEL
Anion gap: 10 (ref 5–15)
BUN: 7 mg/dL — ABNORMAL LOW (ref 8–23)
CO2: 31 mmol/L (ref 22–32)
Calcium: 9 mg/dL (ref 8.9–10.3)
Chloride: 87 mmol/L — ABNORMAL LOW (ref 98–111)
Creatinine, Ser: 0.48 mg/dL (ref 0.44–1.00)
GFR calc Af Amer: >60 mL/min (ref >60)
GFR calc non Af Amer: >60 mL/min (ref >60)
Glucose, Bld: 107 mg/dL — ABNORMAL HIGH (ref 70–99)
Potassium: 3.5 mmol/L (ref 3.5–5.1)
Sodium: 128 mmol/L — ABNORMAL LOW (ref 135–145)

## 2019-11-19 MED ORDER — LIDOCAINE 5 % EX PTCH
1.0000 | MEDICATED_PATCH | CUTANEOUS | Status: DC
  Administered 2019-11-19 – 2019-11-21 (×3): 1 via TRANSDERMAL
  Filled 2019-11-19 (×3): qty 1

## 2019-11-19 MED ORDER — TRAMADOL HCL 50 MG PO TABS
50.0000 mg | ORAL_TABLET | Freq: Four times a day (QID) | ORAL | Status: DC | PRN
  Administered 2019-11-19 – 2019-11-21 (×5): 50 mg via ORAL
  Filled 2019-11-19 (×5): qty 1

## 2019-11-19 MED ORDER — NINTEDANIB ESYLATE 100 MG PO CAPS
100.0000 mg | ORAL_CAPSULE | Freq: Two times a day (BID) | ORAL | Status: DC
  Administered 2019-11-19 – 2019-11-20 (×2): 100 mg via ORAL
  Filled 2019-11-19 (×2): qty 1

## 2019-11-19 NOTE — Progress Notes (Signed)
PROGRESS NOTE    [name] [name]  ZTI:[id number] DOB: May 14, 1941 DOA: 11/16/2019 PCP: Alroy Dust, L.Marlou Sa, MD     Brief Narrative:  [name] [name] is a 78 y.o. 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 Elsworth Soho who presents secondary to PET scan finding of hydropneumothorax.  PET scan obtained as an outpatient revealed multiple areas of hypermetabolism associated with consolidative opacity in the left lung with hypermetabolic left supraclavicular lymph node.  Also revealed moderate to large left hydropneumothorax.  Patient underwent thoracentesis.  New events last 24 hours / Subjective: Breathing is about stable, no new complaints today.  Assessment & Plan:   Principal Problem:   Hydropneumothorax Active Problems:   Pulmonary fibrosis (HCC)   Hypertension   Idiopathic pulmonary fibrosis (HCC)   Dyspnea   Metastatic cancer to bone (HCC)   Acute on chronic hypoxemic respiratory failure secondary to hydropneumothorax -Chronically on 2L [place] O2 at baseline   -Covid screening negative -Status post thoracentesis 12/18. Stable left apical pneumothorax s/p thoracentesis -Await pleural cytology.  By Light's criteria, fluid is exudative in nature -Repeat CXR 12/19 shows unchanged small left hydropneumothorax and consolidation in the left midlung -PCCM following   Concern for metastatic disease with history of lung cancer in the past -PET: multiple areas of low level hypermetabolism associated with consolidative opacity in the left upper and lower lung with hypermetabolic left supraclavicular lymph node, focal hypermetabolism in the hilar regions and anterior mediastinum. -MRI thoracic spine: abnormal signal and enhancement at  T3, T4, T5, and T6 consistent with metastatic disease to the spine -Radiation oncology consulted; they have set up an outpatient appointment for 12/29 as well as arranging for follow-up with Dr. Julien Nordmann as an outpatient as well  Hyponatremia -Hold HCTZ -Stable, monitor BMP  Acute/subacute rib fracture -IS   ILD/IPF -Ofev   Essential hypertension -HCTZ held due to hyponatremia -Amlodipine  Hyperlipidemia -Lipitor    DVT prophylaxis: Lovenox  Code Status: Full Family Communication: None at bedside Disposition Plan: Pending results from pleural study   Consultants:   Pulmonology  Radiation oncology  Procedures:   Left thoracentesis by IR 12/18  Antimicrobials:  Anti-infectives (From admission, onward)   None       Objective: Vitals:   11/18/19 1803 11/18/19 2258 11/19/19 0543 11/19/19 0738  BP: (!) 147/69 (!) 159/77  (!) 155/76  Pulse: 93 97  71  Resp: 16 17  17   Temp: 98.5 F (36.9 C) 98.3 F (36.8 C)  97.7 F (36.5 C)  TempSrc: Oral Oral  Oral  SpO2: 100% 98%  97%  Weight:   58.6 kg   Height:        Intake/Output Summary (Last 24 hours) at 11/19/2019 0946 Last data filed at 11/18/2019 8250 Gross per 24 hour  Intake 250 ml  Output -  Net 250 ml   Filed Weights   11/17/19 1220 11/18/19 0416 11/19/19 0543  Weight: 55 kg 55.9 kg 58.6 kg    Examination: General exam: Appears calm and comfortable  Respiratory system: Crackles diffusely in the right lung field, diminished breath sounds left mid and lower lobes.  No distress on nasal cannula O2.  No conversational dyspnea Cardiovascular system: S1 & S2 heard, RRR. No pedal edema. Gastrointestinal system: Abdomen is nondistended, soft and nontender.  Normal bowel sounds heard. Central nervous system: Alert and oriented. Non focal exam. Speech clear  Extremities: Symmetric in appearance bilaterally  Skin: No rashes, lesions or ulcers on exposed skin  Psychiatry: Judgement and insight appear stable.  Mood & affect appropriate.    Data Reviewed: I have personally reviewed following labs and imaging studies  CBC: Recent Labs  Lab 11/16/19 1947 11/17/19 0156 11/18/19 0314  WBC 11.9* 12.2* 11.9*  HGB 12.5 12.8 12.4  HCT 35.3* 36.7 35.6*  MCV 89.6 92.2 92.0  PLT 477* 437* 431*   Basic Metabolic Panel: Recent Labs  Lab 11/16/19 1947 11/17/19 0156 11/18/19 0314 11/19/19 0428  NA 120* 124* 129* 128*  K 3.6 3.5 3.1* 3.5  CL 76* 80* 84* 87*  CO2 30 32 36* 31  GLUCOSE 118* 86 115* 107*  BUN 10 10 7* 7*  CREATININE 0.59 0.51 0.56 0.48  CALCIUM 9.4 9.4 9.3 9.0   GFR: Estimated Creatinine Clearance: 47.3 mL/min (by C-G formula based on SCr of 0.48 mg/dL). Liver Function Tests: Recent Labs  Lab 11/17/19 0156  AST 21  ALT 12  ALKPHOS 54  BILITOT 0.5  PROT 5.8*  ALBUMIN 2.9*   No results for input(s): LIPASE, AMYLASE in the last 168 hours. No results for input(s): AMMONIA in the last 168 hours. Coagulation Profile: Recent Labs  Lab 11/17/19 0156  INR 1.0   Cardiac Enzymes: No results for input(s): CKTOTAL, CKMB, CKMBINDEX, TROPONINI in the last 168 hours. BNP (last 3 results) No results for input(s): PROBNP in the last 8760 hours. HbA1C: No results for input(s): HGBA1C in the last 72 hours. CBG: Recent Labs  Lab 11/16/19 1150  GLUCAP 128*   Lipid Profile: No results for input(s): CHOL, HDL, LDLCALC, TRIG, CHOLHDL, LDLDIRECT in the last 72 hours. Thyroid Function Tests: Recent Labs    11/17/19 1222  TSH 2.429   Anemia Panel: No results for input(s): VITAMINB12, FOLATE, FERRITIN, TIBC, IRON, RETICCTPCT in the last 72 hours. Sepsis Labs: No results for input(s): PROCALCITON, LATICACIDVEN in the last 168 hours.  Recent Results (from the past 240 hour(s))  SARS CORONAVIRUS 2 (TAT 6-24 HRS) Nasopharyngeal Nasopharyngeal Swab     Status: None   Collection Time: 11/16/19 10:08 PM   Specimen: Nasopharyngeal Swab  Result Value Ref Range Status   SARS  Coronavirus 2 NEGATIVE NEGATIVE Final    Comment: (NOTE) SARS-CoV-2 target nucleic acids are NOT DETECTED. The SARS-CoV-2 RNA is generally detectable in upper and lower respiratory specimens during the acute phase of infection. Negative results do not preclude SARS-CoV-2 infection, do not rule out co-infections with other pathogens, and should not be used as the sole basis for treatment or other patient management decisions. Negative results must be combined with clinical observations, patient history, and epidemiological information. The expected result is Negative. Fact Sheet for Patients: SugarRoll.be Fact Sheet for Healthcare Providers: https://www.woods-mathews.com/ This test is not yet approved or cleared by the [place] FDA and  has been authorized for detection and/or diagnosis of SARS-CoV-2 by FDA under an Emergency Use Authorization (EUA). This EUA will remain  in effect (meaning this test can be used) for the duration of the COVID-19 declaration under Section 56 4(b)(1) of the Act, 21 U.S.C. section 360bbb-3(b)(1), unless the authorization is terminated or revoked sooner. Performed at [facility], [place] [address] [place],  [address]   Body fluid culture     Status: None (Preliminary result)   Collection Time: 11/17/19  9:36 AM  Specimen: Lung, Left; Pleural Fluid  Result Value Ref Range Status   Specimen Description PLEURAL LEFT  Final   Special Requests NONE  Final   Gram Stain   Final    RARE WBC PRESENT, PREDOMINANTLY MONONUCLEAR NO ORGANISMS SEEN    Culture   Final    NO GROWTH 2 DAYS Performed at [facility], [address] [place], [place] [address]    Report Status PENDING  Incomplete  MRSA PCR Screening     Status: None   Collection Time: 11/17/19  6:02 PM   Specimen: Nasopharyngeal  Result Value Ref Range Status   MRSA by PCR NEGATIVE NEGATIVE Final    Comment:        The GeneXpert  MRSA Assay (FDA approved for NASAL specimens only), is one component of a comprehensive MRSA colonization surveillance program. It is not intended to diagnose MRSA infection nor to guide or monitor treatment for MRSA infections. Performed at [facility], [place] [address], [place], [place] [address]       Radiology Studies: DG CHEST PORT 1 VIEW  Result Date: 11/18/2019 CLINICAL DATA:  Hydropneumothorax. EXAM: PORTABLE CHEST 1 VIEW COMPARISON:  Chest x-ray from yesterday. FINDINGS: Stable cardiomediastinal silhouette. Unchanged small left apical pneumothorax. Unchanged pleural thickening/fluid along the left lateral chest wall adjacent to displaced fifth and sixth rib fractures. Advanced chronic interstitial lung disease again noted. Unchanged consolidation in the left mid lung. No acute osseous abnormality. IMPRESSION: 1. Unchanged small left hydropneumothorax and consolidation in the left mid lung. Electronically Signed   By: Titus Dubin M.D.   On: 11/18/2019 09:37   ECHOCARDIOGRAM COMPLETE  Result Date: 11/17/2019   ECHOCARDIOGRAM REPORT   Patient Name:   [name] [name] Date of Exam: 11/17/2019 Medical Rec #:  [id number]       Height:       62.0 in Accession #:    [id number]      Weight:       135.0 lb Date of Birth:  [birth year]       BSA:          1.62 m Patient Age:    57 years        BP:           109/66 mmHg Patient Gender: F               HR:           73 bpm. Exam Location:  Inpatient Procedure: Limited Echo and Limited Color Doppler Indications:    CHF-Acute Diastolic 481.85 / U31.49  History:        Patient has prior history of Echocardiogram examinations, most                 recent 11/16/2019. Risk Factors:Hypertension and Dyslipidemia.                 Lung cancer, pulmonary fibrosis.  Sonographer:    Darlina Sicilian RDCS Referring Phys: [place]  1. Left ventricular ejection fraction, by visual estimation, is 60 to 65%. The left ventricle has normal  function. Mildly increased left ventricular posterior wall thickness. There is severely increased left ventricular hypertrophy of the basal septum.  2. Global right ventricle has normal systolic function.The right ventricular size is normal. No increase in right ventricular wall thickness.  3. Left atrial size was normal.  4. Right atrial size was normal.  5. The mitral valve is normal in structure. No  evidence of mitral valve regurgitation. No evidence of mitral stenosis.  6. The tricuspid valve is normal in structure. Tricuspid valve regurgitation is trivial.  7. The aortic valve is normal in structure. Aortic valve regurgitation is not visualized. No evidence of aortic valve sclerosis or stenosis.  8. The pulmonic valve was normal in structure. Pulmonic valve regurgitation is not visualized.  9. Normal pulmonary artery systolic pressure. 10. The inferior vena cava is normal in size with greater than 50% respiratory variability, suggesting right atrial pressure of 3 mmHg. 11. The left ventricle has no regional wall motion abnormalities. 12. Left ventricular diastolic function could not be evaluated. FINDINGS  Left Ventricle: Left ventricular ejection fraction, by visual estimation, is 60 to 65%. The left ventricle has normal function. The left ventricle has no regional wall motion abnormalities. Mildly increased left ventricular posterior wall thickness. There is severely increased left ventricular hypertrophy. Left ventricular diastolic function could not be evaluated. Indeterminate filling pressures. Right Ventricle: The right ventricular size is normal. No increase in right ventricular wall thickness. Global RV systolic function is has normal systolic function. The tricuspid regurgitant velocity is 2.50 m/s, and with an assumed right atrial pressure  of 3 mmHg, the estimated right ventricular systolic pressure is normal at 28.0 mmHg. Left Atrium: Left atrial size was normal in size. Right Atrium: Right atrial  size was normal in size Pericardium: There is no evidence of pericardial effusion. Mitral Valve: The mitral valve is normal in structure. No evidence of mitral valve regurgitation. No evidence of mitral valve stenosis by observation. Tricuspid Valve: The tricuspid valve is normal in structure. Tricuspid valve regurgitation is trivial. Aortic Valve: The aortic valve is normal in structure. Aortic valve regurgitation is not visualized. The aortic valve is structurally normal, with no evidence of sclerosis or stenosis. Pulmonic Valve: The pulmonic valve was normal in structure. Pulmonic valve regurgitation is not visualized. Pulmonic regurgitation is not visualized. Aorta: The aortic root, ascending aorta and aortic arch are all structurally normal, with no evidence of dilitation or obstruction. Venous: The inferior vena cava is normal in size with greater than 50% respiratory variability, suggesting right atrial pressure of 3 mmHg. IAS/Shunts: No atrial level shunt detected by color flow Doppler. There is no evidence of a patent foramen ovale. No ventricular septal defect is seen or detected. There is no evidence of an atrial septal defect.  LEFT VENTRICLE PLAX 2D LVIDd:         2.90 cm LVIDs:         1.70 cm LV PW:         1.40 cm LV IVS:        1.70 cm LVOT diam:     1.70 cm LV SV:         24 ml LV SV Index:   14.45 LVOT Area:     2.27 cm  LEFT ATRIUM           Index LA diam:      2.60 cm 1.61 cm/m LA Vol (A2C): 34.3 ml 21.21 ml/m LA Vol (A4C): 42.4 ml 26.21 ml/m  TRICUSPID VALVE TR Peak grad:   25.0 mmHg TR Vmax:        250.00 cm/s  SHUNTS Systemic Diam: 1.70 cm  Fransico Him MD Electronically signed by Fransico Him MD Signature Date/Time: 11/17/2019/11:50:50 AM    Final       Scheduled Meds: . amLODipine  10 mg Oral Daily  . atorvastatin  20 mg Oral q morning -  10a  . enoxaparin (LOVENOX) injection  40 mg Subcutaneous Q24H  . loratadine  10 mg Oral Daily  . Nintedanib  100 mg Oral BID WC  .  polyvinyl alcohol  1 drop Both Eyes Daily   Continuous Infusions:   LOS: 3 days      Time spent: 25 minutes   Dessa Phi, DO [facility] 11/19/2019, 9:46 AM   Available via Epic secure chat 7am-7pm After these hours, please refer to coverage provider listed on [email or web address]

## 2019-11-20 ENCOUNTER — Inpatient Hospital Stay (HOSPITAL_COMMUNITY): Payer: Medicare Other

## 2019-11-20 ENCOUNTER — Encounter (HOSPITAL_COMMUNITY): Payer: Self-pay | Admitting: Internal Medicine

## 2019-11-20 LAB — BASIC METABOLIC PANEL
Anion gap: 9 (ref 5–15)
BUN: 5 mg/dL — ABNORMAL LOW (ref 8–23)
CO2: 32 mmol/L (ref 22–32)
Calcium: 9 mg/dL (ref 8.9–10.3)
Chloride: 87 mmol/L — ABNORMAL LOW (ref 98–111)
Creatinine, Ser: 0.49 mg/dL (ref 0.44–1.00)
GFR calc Af Amer: >60 mL/min (ref >60)
GFR calc non Af Amer: >60 mL/min (ref >60)
Glucose, Bld: 100 mg/dL — ABNORMAL HIGH (ref 70–99)
Potassium: 3.8 mmol/L (ref 3.5–5.1)
Sodium: 128 mmol/L — ABNORMAL LOW (ref 135–145)

## 2019-11-20 LAB — BODY FLUID CULTURE: Culture: NO GROWTH

## 2019-11-20 LAB — PH, BODY FLUID: pH, Body Fluid: 7.9

## 2019-11-20 MED ORDER — NINTEDANIB ESYLATE 100 MG PO CAPS
100.0000 mg | ORAL_CAPSULE | Freq: Two times a day (BID) | ORAL | Status: DC
  Administered 2019-11-20 – 2019-11-21 (×2): 100 mg via ORAL
  Filled 2019-11-20 (×2): qty 1

## 2019-11-20 NOTE — Progress Notes (Signed)
PROGRESS NOTE    [name] [name]  PYK:[id number] DOB: [birth year] DOA: 11/16/2019 PCP: Alroy Dust, L.Marlou Sa, MD     Brief Narrative:  [name] [name] is a 78 y.o. 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 Elsworth Soho who presents secondary to PET scan finding of hydropneumothorax.  PET scan obtained as an outpatient revealed multiple areas of hypermetabolism associated with consolidative opacity in the left lung with hypermetabolic left supraclavicular lymph node.  Also revealed moderate to large left hydropneumothorax.  Patient underwent thoracentesis.  New events last 24 hours / Subjective: No new complaints today. Breathing stable. Awaiting cytology results.   Assessment & Plan:   Principal Problem:   Hydropneumothorax Active Problems:   Pulmonary fibrosis (HCC)   Hypertension   Idiopathic pulmonary fibrosis (HCC)   Dyspnea   Metastatic cancer to bone (HCC)   Acute on chronic hypoxemic respiratory failure secondary to hydropneumothorax -Chronically on 2L [place] O2 at baseline   -Covid screening negative -Status post thoracentesis 12/18. Stable left apical pneumothorax s/p thoracentesis -Await pleural cytology.  By Light's criteria, fluid is exudative in nature -Repeat CXR 12/19 shows unchanged small left hydropneumothorax and consolidation in the left midlung -Repeat CXR 12/21 shows loculated pleural effusion in the left lateral chest and apex region. The pneumothorax in this area seen 1 day prior is not appreciable, although fluid could be present superimposed on pneumothorax in this area. -PCCM following   Concern for metastatic disease with history of lung cancer in the past -PET: multiple areas of low level  hypermetabolism associated with consolidative opacity in the left upper and lower lung with hypermetabolic left supraclavicular lymph node, focal hypermetabolism in the hilar regions and anterior mediastinum. -MRI thoracic spine: abnormal signal and enhancement at T3, T4, T5, and T6 consistent with metastatic disease to the spine -Radiation oncology consulted; they have set up an outpatient appointment for 12/29 as well as arranging for follow-up with Dr. Julien Nordmann as an outpatient as well -Appreciate oncology consultation -Obtain MRI brain to staging   Hyponatremia -Hold HCTZ -Stable, monitor BMP  Acute/subacute rib fracture -IS   ILD/IPF -Ofev   Essential hypertension -HCTZ held due to hyponatremia -Amlodipine  Hyperlipidemia -Lipitor    DVT prophylaxis: Lovenox  Code Status: Full Family Communication: None at bedside Disposition Plan: Pending results from pleural study   Consultants:   Pulmonology  Radiation oncology  Oncology   Procedures:   Left thoracentesis by IR 12/18  Antimicrobials:  Anti-infectives (From admission, onward)   None       Objective: Vitals:   11/20/19 0453 11/20/19 0500 11/20/19 0809 11/20/19 1538  BP: (!) 174/83  (!) 154/71 (!) 175/79  Pulse: 91  71 (!) 101  Resp:   20 20  Temp:   97.8 F (36.6 C) 98.1 F (36.7 C)  TempSrc:   Oral Oral  SpO2:   98% 94%  Weight:  59.5 kg    Height:       No intake or output data in the 24 hours ending 11/20/19 1623 Filed Weights   11/18/19 0416 11/19/19 0543 11/20/19 0500  Weight: 55.9 kg 58.6 kg 59.5 kg    Examination: General exam: Appears calm and comfortable  Respiratory system: Crackles, diminished in left base, without conversational dyspnea  Cardiovascular system: S1 & S2  heard, RRR. No pedal edema. Gastrointestinal system: Abdomen is nondistended, soft and nontender. Normal bowel sounds heard. Central nervous system: Alert and oriented. Non focal exam. Speech clear   Extremities: Symmetric in appearance bilaterally  Skin: No rashes, lesions or ulcers on exposed skin  Psychiatry: Judgement and insight appear stable. Mood & affect appropriate.    Data Reviewed: I have personally reviewed following labs and imaging studies  CBC: Recent Labs  Lab 11/16/19 1947 11/17/19 0156 11/18/19 0314  WBC 11.9* 12.2* 11.9*  HGB 12.5 12.8 12.4  HCT 35.3* 36.7 35.6*  MCV 89.6 92.2 92.0  PLT 477* 437* 151*   Basic Metabolic Panel: Recent Labs  Lab 11/16/19 1947 11/17/19 0156 11/18/19 0314 11/19/19 0428 11/20/19 0251  NA 120* 124* 129* 128* 128*  K 3.6 3.5 3.1* 3.5 3.8  CL 76* 80* 84* 87* 87*  CO2 30 32 36* 31 32  GLUCOSE 118* 86 115* 107* 100*  BUN 10 10 7* 7* 5*  CREATININE 0.59 0.51 0.56 0.48 0.49  CALCIUM 9.4 9.4 9.3 9.0 9.0   GFR: Estimated Creatinine Clearance: 47.3 mL/min (by C-G formula based on SCr of 0.49 mg/dL). Liver Function Tests: Recent Labs  Lab 11/17/19 0156  AST 21  ALT 12  ALKPHOS 54  BILITOT 0.5  PROT 5.8*  ALBUMIN 2.9*   No results for input(s): LIPASE, AMYLASE in the last 168 hours. No results for input(s): AMMONIA in the last 168 hours. Coagulation Profile: Recent Labs  Lab 11/17/19 0156  INR 1.0   Cardiac Enzymes: No results for input(s): CKTOTAL, CKMB, CKMBINDEX, TROPONINI in the last 168 hours. BNP (last 3 results) No results for input(s): PROBNP in the last 8760 hours. HbA1C: No results for input(s): HGBA1C in the last 72 hours. CBG: Recent Labs  Lab 11/16/19 1150  GLUCAP 128*   Lipid Profile: No results for input(s): CHOL, HDL, LDLCALC, TRIG, CHOLHDL, LDLDIRECT in the last 72 hours. Thyroid Function Tests: No results for input(s): TSH, T4TOTAL, FREET4, T3FREE, THYROIDAB in the last 72 hours. Anemia Panel: No results for input(s): VITAMINB12, FOLATE, FERRITIN, TIBC, IRON, RETICCTPCT in the last 72 hours. Sepsis Labs: No results for input(s): PROCALCITON, LATICACIDVEN in the last 168 hours.   Recent Results (from the past 240 hour(s))  SARS CORONAVIRUS 2 (TAT 6-24 HRS) Nasopharyngeal Nasopharyngeal Swab     Status: None   Collection Time: 11/16/19 10:08 PM   Specimen: Nasopharyngeal Swab  Result Value Ref Range Status   SARS Coronavirus 2 NEGATIVE NEGATIVE Final    Comment: (NOTE) SARS-CoV-2 target nucleic acids are NOT DETECTED. The SARS-CoV-2 RNA is generally detectable in upper and lower respiratory specimens during the acute phase of infection. Negative results do not preclude SARS-CoV-2 infection, do not rule out co-infections with other pathogens, and should not be used as the sole basis for treatment or other patient management decisions. Negative results must be combined with clinical observations, patient history, and epidemiological information. The expected result is Negative. Fact Sheet for Patients: SugarRoll.be Fact Sheet for Healthcare Providers: https://www.woods-mathews.com/ This test is not yet approved or cleared by the [place] FDA and  has been authorized for detection and/or diagnosis of SARS-CoV-2 by FDA under an Emergency Use Authorization (EUA). This EUA will remain  in effect (meaning this test can be used) for the duration of the COVID-19 declaration under Section 56 4(b)(1) of the Act, 21 U.S.C. section 360bbb-3(b)(1), unless the authorization is terminated or revoked sooner. Performed at [facility], [place] [address]  [place], [place] [address]   Body fluid culture     Status: None   Collection Time: 11/17/19  9:36 AM   Specimen: Lung, Left; Pleural Fluid  Result Value Ref Range Status   Specimen Description PLEURAL LEFT  Final   Special Requests NONE  Final   Gram Stain   Final    RARE WBC PRESENT, PREDOMINANTLY MONONUCLEAR NO ORGANISMS SEEN    Culture   Final    NO GROWTH 3 DAYS Performed at [facility], [place] [address], [place], [place] [address]    Report Status 11/20/2019  FINAL  Final  MRSA PCR Screening     Status: None   Collection Time: 11/17/19  6:02 PM   Specimen: Nasopharyngeal  Result Value Ref Range Status   MRSA by PCR NEGATIVE NEGATIVE Final    Comment:        The GeneXpert MRSA Assay (FDA approved for NASAL specimens only), is one component of a comprehensive MRSA colonization surveillance program. It is not intended to diagnose MRSA infection nor to guide or monitor treatment for MRSA infections. Performed at [facility], [place] [address], [place],  [address]       Radiology Studies: DG Chest 2 View  Result Date: 11/20/2019 CLINICAL DATA:  Lung carcinoma with hypoxia EXAM: CHEST - 2 VIEW COMPARISON:  November 18, 2019 FINDINGS: There is loculated effusion on the left tracking into the left apex. The pneumothorax seen in this area 1 day prior is not evident on this study. There is fibrosis throughout both lungs. There is consolidation in the left mid lung region, stable, with potential underlying mass. No new opacity is evident in the lung parenchyma. Heart is upper normal in size with pulmonary vascularity normal. No adenopathy is evident by radiography. There is aortic atherosclerosis. Several rib fractures are noted on the left, stable. IMPRESSION: Loculated pleural effusion in the left lateral chest and apex region. The pneumothorax in this area seen 1 day prior is not appreciable, although fluid could be present superimposed on pneumothorax in this area. Consolidation with questionable underlying mass in the left mid lung region is stable. There is underlying fibrosis. No new parenchymal lung opacity. Stable cardiac silhouette.  Aortic Atherosclerosis (ICD10-I70.0). Electronically Signed   By: Lowella Grip III M.D.   On: 11/20/2019 07:36      Scheduled Meds: . amLODipine  10 mg Oral Daily  . atorvastatin  20 mg Oral q morning - 10a  . enoxaparin (LOVENOX) injection  40 mg Subcutaneous Q24H  . lidocaine  1 patch  Transdermal Q24H  . loratadine  10 mg Oral Daily  . Nintedanib  100 mg Oral BID WC  . polyvinyl alcohol  1 drop Both Eyes Daily   Continuous Infusions:   LOS: 4 days      Time spent: 25 minutes   Dessa Phi, DO [facility] 11/20/2019, 4:23 PM   Available via Epic secure chat 7am-7pm After these hours, please refer to coverage provider listed on [email or web address]

## 2019-11-20 NOTE — Consult Note (Addendum)
[place]  Telephone:([phone]) [phone] Fax:(336) [phone]   [place]  Referral MD: Dr. 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  The patient has been followed closely by Dr. Elsworth Soho from [facility] for IPF and chronic hypoxic respiratory failure.  The patient developed neck pain and initially was seen by Dr. Tonita Cong and had an MRI of the cervical spine in his office which showed diffuse bone marrow changes and they could not rule out metastatic disease.  This was brought to the attention of her pulmonologist who ordered a PET scan.  In the interim, due to ongoing neck pain, she was seen by orthopedics at [facility] who ordered an x-ray of the cervical spine which was performed on 11/13/2019 which showed no acute fracture, reversal of normal cervical lordosis which reduces with extension, multilevel degenerative changes of the cervical spine involving anterior osteophytes, endplate sclerosis, and intervertebral disc space loss most advanced at C4-C6.  The patient underwent a left C4-5 and C5-6 transforaminal epidural steroid injection on 11/15/2019.  Her PET scan was performed on 11/16/2019 which showed a moderate to large left hydropneumothorax.  The patient was advised to come to the emergency room for evaluation.  According to her admission history and physical, the patient had been noticing an increase in dyspnea in the 1 month prior to admission.  She also has a chronic dry cough and left-sided discomfort.  Admission lab work showed a  sodium of 120.  She underwent an ultrasound-guided thoracentesis on 11/17/2019 which yielded 470 cc of pleural fluid.  This was sent for cytology which is currently pending.  When seen today, the patient reports that she has been having anorexia and weight loss of approximately 10 pounds prior to admission.  She thinks of her anorexia was related to nausea.  She reports that she is now eating better and her nausea is intermittent.  She is not having any vomiting associated with her nausea.  She still reports ongoing shortness of breath which is unchanged.  She also has a nonproductive cough which is unchanged.  She denies chest pain and hemoptysis.  Denies headaches and visual changes.  She has been taking tramadol for her pain which is currently well controlled.  Reports constipation due to pain medication usage.  The patient reports swelling to her left neck.  This has been present for several months.  The patient is divorced.  She currently lives [place] in independent living.  She has a son and a daughter who both live locally.  She currently drinks 1 alcoholic drink per day.  The patient previously smoked 1 pack of cigarettes per day x40 years.  She quit in 2013.  Family history significant for father with CAD and a mother with cancer including areas in her bones, throat, and lungs. Medical oncology was asked see the patient to make recommendations regarding her metastatic cancer.    Past Medical History:  Diagnosis Date  . Anxiety   . Arthritis    a. 10/2012 s/p Right THA.  Marland Kitchen Arthritis    "  hands" (09/21/2017)  . Chronic lower back pain   . Complication of anesthesia    "couldn't wake me up after my hip surgery" (09/21/2017)  . Depression   . History of ARDS   . Hyperlipidemia   . Hypertension   . Lung cancer ([place]) 02/16/13   LUL lung SQUAMOUS CELL  . Lung cancer ([place]) dx'd 12/2013    LLL ADENOCARCINOMA  . Pulmonary fibrosis ([place]) dx'd ~ 07/2017  . Pulmonary nodule    a. 10/2012 CT:  42m anterior LUL nodule w/ spiculated appearance - rec PET CT.  . Seasonal allergies    "I take Claritin" (09/21/2017)  . Sinus headache   :  Past Surgical History:  Procedure Laterality Date  . BACK SURGERY    . CATARACT EXTRACTION W/ INTRAOCULAR LENS  IMPLANT, BILATERAL Bilateral   . EYE SURGERY Right ~ 1949   "removed a growth"  . IR THORACENTESIS ASP PLEURAL SPACE W/IMG GUIDE  11/17/2019  . JOINT REPLACEMENT    [place]  [place]   "[place] discs; [facility] [place] [facility]  . LUNG BIOPSY Left 02/16/13   LUL =non small cell ca with assoc/necrosis  . TONSILLECTOMY    . TOTAL HIP ARTHROPLASTY  11/03/2012   Procedure: TOTAL HIP ARTHROPLASTY ANTERIOR APPROACH;  Surgeon: CMcarthur Rossetti MD;  Location: [facility];  Service: Orthopedics;  Laterality: Right;  Right Total Hip Arthroplasty, Anterior Approach (C-Arm)  . TUBAL LIGATION    :  Current Facility-Administered Medications  Medication Dose Route Frequency Provider Last Rate Last Admin  . acetaminophen (TYLENOL) tablet 650 mg  650 mg Oral Q6H PRN KJani Gravel MD   650 mg at 11/19/19 1022   Or  . acetaminophen (TYLENOL) suppository 650 mg  650 mg Rectal Q6H PRN KJani Gravel MD      . amLODipine (NORVASC) tablet 10 mg  10 mg Oral Daily KJani Gravel MD   10 mg at 11/20/19 0856  . atorvastatin (LIPITOR) tablet 20 mg  20 mg Oral q morning - 10a KJani Gravel MD   20 mg at 11/20/19 0856  . calcium carbonate (TUMS - dosed in mg elemental calcium) chewable tablet 200 mg of elemental calcium  1 tablet Oral BID PRN CDessa Phi DO      . enoxaparin (LOVENOX) injection 40 mg  40 mg Subcutaneous Q24H KJani Gravel MD   40 mg at 11/20/19 0856  . lidocaine (LIDODERM) 5 % 1 patch  1 patch Transdermal Q24H CDessa Phi DO   1 patch at 11/20/19 1239  . lidocaine (PF) (XYLOCAINE) 1 % injection   Infiltration PRN BAscencion Dike PA-C   10 mL at 11/17/19 0859  . loperamide (IMODIUM) capsule 2 mg  2 mg Oral PRN KJani Gravel MD      .  loratadine (CLARITIN) tablet 10 mg  10 mg Oral Daily KJani Gravel MD   10 mg at 11/20/19 0856  . Nintedanib (Ofev) CAPS 100 mg  100 mg Oral BID WC CDessa Phi DO   100 mg at 11/20/19 0729  . polyvinyl alcohol (LIQUIFILM TEARS) 1.4 % ophthalmic solution 1 drop  1 drop Both Eyes Daily KJani Gravel MD   1 drop at 11/20/19 0900  . traMADol (ULTRAM) tablet 50 mg  50 mg Oral Q6H PRN CDessa Phi DO   50 mg at 11/20/19 0449     Allergies  Allergen Reactions  . Penicillins Hives, Swelling and Rash    Has patient had a PCN reaction causing  immediate rash, facial/tongue/throat swelling, SOB or lightheadedness with hypotension: Yes Has patient had a PCN reaction causing severe rash involving mucus membranes or skin necrosis: No Has patient had a PCN reaction that required hospitalization: No Has patient had a PCN reaction occurring within the last 10 years: No If all of the above answers are "NO", then may proceed with Cephalosporin use.   :  Family History  Problem Relation Age of Onset  . CAD Father   . Lung disease Neg Hx   . Rheumatologic disease Neg Hx   :  Social History   Socioeconomic History  . Marital status: Divorced    Spouse name: Not on file  . Number of children: 2  . Years of education: Not on file  . Highest education level: Not on file  Occupational History  . Not on file  Tobacco Use  . Smoking status: Former Smoker    Packs/day: 1.00    Years: 40.00    Pack years: 40.00    Types: Cigarettes    Quit date: 11/03/2012    Years since quitting: 7.0  . Smokeless tobacco: Never Used  Substance and Sexual Activity  . Alcohol use: Yes    Alcohol/week: 17.0 standard drinks    Types: 17 Shots of liquor per week    Comment: 09/21/2017 "2-3 shots of liquor /night"  . Drug use: No  . Sexual activity: Never  Other Topics Concern  . Not on file  Social History Narrative   [facility] (09/22/17):   Previously worked as a Radio broadcast assistant. No mold or asbestos  exposure. Does have remote bird exposure as a child. Currently lives in a home with 16 steps and her bedroom is on the top floor. Only has a half bath on the first floor.   Social Determinants of Health   Financial Resource Strain:   . Difficulty of Paying Living Expenses: Not on file  Food Insecurity:   . Worried About Charity fundraiser in the Last Year: Not on file  . Ran Out of Food in the Last Year: Not on file  Transportation Needs:   . Lack of Transportation (Medical): Not on file  . Lack of Transportation (Non-Medical): Not on file  Physical Activity:   . Days of Exercise per Week: Not on file  . Minutes of Exercise per Session: Not on file  Stress:   . Feeling of Stress : Not on file  Social Connections:   . Frequency of Communication with Friends and Family: Not on file  . Frequency of Social Gatherings with Friends and Family: Not on file  . Attends Religious Services: Not on file  . Active Member of Clubs or Organizations: Not on file  . Attends Archivist Meetings: Not on file  . Marital Status: Not on file  Intimate Partner Violence:   . Fear of Current or Ex-Partner: Not on file  . Emotionally Abused: Not on file  . Physically Abused: Not on file  . Sexually Abused: Not on file  :  Review of Systems: A comprehensive 14 point review of systems was negative except as noted in the HPI.  Exam: Patient Vitals for the past 24 hrs:  BP Temp Temp src Pulse Resp SpO2 Weight  11/20/19 0809 (!) 154/71 97.8 F (36.6 C) Oral 71 20 98 % --  11/20/19 0500 -- -- -- -- -- -- 131 lb 2.8 oz (59.5 kg)  11/20/19 0453 (!) 174/83 -- -- 91 -- -- --  11/19/19 2305 (!) 150/75 97.8 F (36.6 C) Oral 74 18 98 % --  11/19/19 1702 (!) 149/89 (!) 97.4 F (36.3 C) Oral 87 16 98 % --  11/19/19 1509 (!) 159/90 -- -- 87 (!) 24 -- --    General: Awake and alert, no distress Eyes: PERRL, no scleral icterus ENT:  There were no oropharyngeal lesions.   Lymphatics: Palpable  lymphadenopathy in the left supraclavicular area Respiratory: Lungs with diminished breath sounds and rales to the bases Cardiovascular:  Regular rate and rhythm, S1/S2, without murmur, rub or gallop.  There was no pedal edema.   GI:  abdomen was soft, flat, nontender, nondistended, without organomegaly.   Musculoskeletal:  no spinal tenderness of palpation of vertebral spine.   Skin exam was without echymosis, petichae.   Neuro exam was nonfocal. Patient was alert and oriented.  Attention was good.   Language was appropriate.  Mood was normal without depression.  Speech was not pressured.  Thought content was not tangential.     Lab Results  Component Value Date   WBC 11.9 (H) 11/18/2019   HGB 12.4 11/18/2019   HCT 35.6 (L) 11/18/2019   PLT 403 (H) 11/18/2019   GLUCOSE 100 (H) 11/20/2019   CHOL 169 09/02/2018   TRIG 84.0 09/02/2018   HDL 72.60 09/02/2018   LDLCALC 79 09/02/2018   ALT 12 11/17/2019   AST 21 11/17/2019   NA 128 (L) 11/20/2019   K 3.8 11/20/2019   CL 87 (L) 11/20/2019   CREATININE 0.49 11/20/2019   BUN 5 (L) 11/20/2019   CO2 32 11/20/2019    DG Chest 1 View  Result Date: 11/17/2019 CLINICAL DATA:  Status post thoracentesis on the left EXAM: CHEST  1 VIEW COMPARISON:  November 17, 2019 radiograph obtained earlier in the day; PET-CT November 16, 2019 FINDINGS: There is a stable small left apical pneumothorax without tension component. Left pleural effusion is smaller following thoracentesis. There is underlying airspace consolidation in the left upper and mid lung regions. There is underlying fibrosis. No new opacity is evident. Heart is upper normal in size with pulmonary vascularity within normal limits. There is aortic atherosclerosis. No appreciable adenopathy evident by radiography. Known bony metastases are not well seen by radiography. IMPRESSION: Stable left apical pneumothorax without appreciable tension component. Left pleural effusion smaller following  thoracentesis. Airspace opacity in the left upper to mid lung region remains. Underlying fibrosis. No new opacity. Stable cardiac silhouette. Aortic Atherosclerosis (ICD10-I70.0). Electronically Signed   By: Lowella Grip III M.D.   On: 11/17/2019 09:22   DG Chest 2 View  Result Date: 11/20/2019 CLINICAL DATA:  Lung carcinoma with hypoxia EXAM: CHEST - 2 VIEW COMPARISON:  November 18, 2019 FINDINGS: There is loculated effusion on the left tracking into the left apex. The pneumothorax seen in this area 1 day prior is not evident on this study. There is fibrosis throughout both lungs. There is consolidation in the left mid lung region, stable, with potential underlying mass. No new opacity is evident in the lung parenchyma. Heart is upper normal in size with pulmonary vascularity normal. No adenopathy is evident by radiography. There is aortic atherosclerosis. Several rib fractures are noted on the left, stable. IMPRESSION: Loculated pleural effusion in the left lateral chest and apex region. The pneumothorax in this area seen 1 day prior is not appreciable, although fluid could be present superimposed on pneumothorax in this area. Consolidation with questionable underlying mass in the left mid lung region is stable.  There is underlying fibrosis. No new parenchymal lung opacity. Stable cardiac silhouette.  Aortic Atherosclerosis (ICD10-I70.0). Electronically Signed   By: Lowella Grip III M.D.   On: 11/20/2019 07:36   MR THORACIC SPINE W WO CONTRAST  Result Date: 11/17/2019 CLINICAL DATA:  Lung cancer. Abnormal uptake into the thoracic spine on the PET scan. EXAM: MRI THORACIC WITHOUT AND WITH CONTRAST TECHNIQUE: Multiplanar and multiecho pulse sequences of the thoracic spine were obtained without and with intravenous contrast. CONTRAST:  5.41m GADAVIST GADOBUTROL 1 MMOL/ML IV SOLN COMPARISON:  PET scan 11/16/2019. CT of the chest 12/06/2017 and 12/07/2018 FINDINGS: MRI THORACIC SPINE FINDINGS  Alignment: Slight degenerative retrolisthesis is present at T11-12 and T12-L1. No other significant listhesis is present. Levoconvex curvature is centered at T7-8. Vertebrae: Abnormal signal and enhancement is present at T3, T4, T5, and T6, consistent with metastatic disease to the spine. Enhancement occupies the posterior 2/3 of the vertebral body at T3 without significant extension in the pedicles. There is diffuse enhancement in the posterior 1/3 of the vertebral body at T4 without abnormal signal and enhancement extending into the left pedicle. More heterogeneous posterior enhancement is present at T5 and T6 with abnormal signal and enhancement extending into the left pedicle of both levels. No pathologic fractures are present. No definite extraosseous tumor or enhancement is present. No other focal areas of enhancement are present within the thoracic or visualized upper lumbar spine. The scout image demonstrates signal change at C6 and possibly C5, is consistent with additional metastases. There is no definite enhancement in this region. This likely represents more typical degenerative change. Cord:  Normal signal is present in the thoracic spinal cord. Paraspinal and other soft tissues: Bilateral pleural effusions are present. Post obstructive disease is present in the left lung. Bilateral airspace opacities are noted. Complex cysts are again noted in the left kidney. Upper abdomen is otherwise unremarkable. Disc levels: T1-2: A broad-based disc protrusion is asymmetric to the right. Moderate right and mild left foraminal narrowing is present. T2-3: Broad-based disc bulging and facet disease contributes to mild foraminal narrowing bilaterally, right greater than left. T3-4: Facet spurring contributes to mild right foraminal narrowing. T4-5: No significant disc disease. Facet disease is worse on the right. T5-6: No significant disc disease. Facet hypertrophy is worse on the left. T6-7: A prominent right  paramedian disc extrusion is present. Disc material extends 9 mm above the inferior endplate of T6. There is contact and slight distortion of the ventral surface the cord without abnormal signal. T7-8: A shallow central disc protrusion is present. No significant stenosis is present. T8-9: A left paramedian disc protrusion is present. There is effacement of ventral CSF without significant stenosis. T9-10: A far right lateral disc protrusion extends into the foramen with moderate right and mild left foraminal narrowing. T10-11: Facet spurring contributes to mild right foraminal narrowing. T11-12: A broad-based disc protrusion is present without significant stenosis. T12-L1: A broad-based disc protrusion is present. Mild foraminal narrowing is noted bilaterally. IMPRESSION: 1. Abnormal signal and enhancement at T3, T4, T5, and T6 consistent with metastatic disease to the spine. 2. No pathologic fractures are present. 3. Multilevel spondylosis of the lumbar spine as described. 4. Bilateral lung disease and effusions, left greater than right. Please see PET scan from yesterday for further detail. 5. Complex left renal cysts are stable. Electronically Signed   By: CSan MorelleM.D.   On: 11/17/2019 05:43   NM PET Image Restag (PS) Skull Base To Thigh  Result Date:  11/16/2019 CLINICAL DATA:  Subsequent treatment strategy for lung cancer. EXAM: NUCLEAR MEDICINE PET SKULL BASE TO THIGH TECHNIQUE: 5.7 mCi F-18 FDG was injected intravenously. Full-ring PET imaging was performed from the skull base to thigh after the radiotracer. CT data was obtained and used for attenuation correction and anatomic localization. Fasting blood glucose: 128 mg/dl COMPARISON:  05/24/2017 FINDINGS: Mediastinal blood pool activity: SUV max 2.4 Liver activity: SUV max NA NECK: No hypermetabolic lymph nodes in the neck. Incidental CT findings: none CHEST: Multiple areas of hypermetabolism are identified in the anterior left lung including  a 3.3 x 2.1 cm consolidative opacity in the medial left upper lung adjacent to the mediastinum (see image 53/4) with SUV max = 5.6. Consolidative opacity seen relatively diffusely in the left parahilar lung shows hypermetabolic FDG uptake with SUV max = 5.6. Focal uptake in the inferior left hilum demonstrates SUV max = 5.1. Focal uptake in the region of the right atrium noted without underlying lesion evident on this noncontrast CT. There is some soft tissue attenuation anterior to the ascending aorta that stable since prior study. Bandlike opacity anterior right upper lobe (image 52/series 4) shows low level hypermetabolism with SUV max = 3.9. Focal hypermetabolism noted in the right hilum without lymphadenopathy evident by CT. Similar focal hypermetabolism identified inferior right hilum. There is a moderate left pleural fluid collection with gas identified in the left anterior pleural space. Low level diffuse FDG accumulation is identified along the left pleural surface. 9 mm short axis left supraclavicular node (34/4) demonstrates SUV max = 3.9. Focus of hypermetabolism identified in the left T11 paraspinal tissues without underlying mass lesion evident by CT. SUV max = 4.4. Incidental CT findings: There is abdominal aortic atherosclerosis without aneurysm. Acute to subacute displaced fractures noted left lateral fifth rib and posterolateral left sixth rib. Extensive chronic interstitial/fibrotic lung disease noted bilaterally. ABDOMEN/PELVIS: No abnormal hypermetabolic activity within the liver, pancreas, adrenal glands, or spleen. No hypermetabolic lymph nodes in the abdomen or pelvis. Incidental CT findings: There is abdominal aortic atherosclerosis without aneurysm. Layering tiny gallstones evident. Multiple left renal cysts of varying size and attenuation some with layering calcific debris or hemorrhage. SKELETON: Hypermetabolism is identified in the posterior aspect of the T3 and T4 vertebral bodies  although no underlying bony lesion is evident. SUV max = 8.4 at the level of T3. Incidental CT findings: Diffuse degenerative changes noted in the spine. Acute to subacute left rib fractures as above. Patient is status post right hip replacement. IMPRESSION: 1. Multiple areas of low level hypermetabolism associated with consolidative opacity in the left upper and lower lung with hypermetabolic left supraclavicular lymph node, focal hypermetabolism in the hilar regions and anterior mediastinum. Imaging features could be related to diffuse neoplasm (adenocarcinoma) with metastatic disease. Infectious/inflammatory etiology not excluded. 2. Moderate to large left hydropneumothorax, likely posttraumatic as there are acute to subacute displaced fractures of the left fourth and fifth ribs. 3. Bandlike opacity in the anterior right lung with low level hypermetabolism, indeterminate. 4. Hypermetabolic focus in the left 11 paraspinal tissues without underlying mass lesion evident by CT. 5. Hypermetabolic activity in the posterior T3 and T4 vertebral bodies without associated mass lesion evident by CT. Thoracic spine MRI with and without contrast could be used to further evaluate as clinically warranted. 6. Advanced chronic interstitial lung disease with fibrotic component. 7.  Aortic Atherosclerois (ICD10-170.0) These results will be called to the ordering clinician or representative by the Radiologist Assistant, and communication documented in the  PACS or zVision Dashboard. Electronically Signed   By: Misty Stanley M.D.   On: 11/16/2019 15:09   DG CHEST PORT 1 VIEW  Result Date: 11/18/2019 CLINICAL DATA:  Hydropneumothorax. EXAM: PORTABLE CHEST 1 VIEW COMPARISON:  Chest x-ray from yesterday. FINDINGS: Stable cardiomediastinal silhouette. Unchanged small left apical pneumothorax. Unchanged pleural thickening/fluid along the left lateral chest wall adjacent to displaced fifth and sixth rib fractures. Advanced chronic  interstitial lung disease again noted. Unchanged consolidation in the left mid lung. No acute osseous abnormality. IMPRESSION: 1. Unchanged small left hydropneumothorax and consolidation in the left mid lung. Electronically Signed   By: Titus Dubin M.D.   On: 11/18/2019 09:37   DG CHEST PORT 1 VIEW  Result Date: 11/17/2019 CLINICAL DATA:  History of broken ribs and pneumothorax. Pleural effusion. EXAM: PORTABLE CHEST 1 VIEW COMPARISON:  PET CT 11/16/2019.  Chest x-ray 09/22/2017. FINDINGS: Mediastinum stable. Stable cardiomegaly. Dense left mid lung infiltrate/mass noted. Chronic interstitial prominence noted. Small left pleural effusion noted. Tiny left apical pneumothorax cannot be excluded. This appears decreased in size from prior PET-CT of 11/16/2019. Left posterior fifth and sixth rib fractures again noted. IMPRESSION: 1. Dense left mid lung field infiltrate/mass noted. Reference is made to recent PET-CT report of 11/16/2019. Chronic interstitial prominence noted. Small left pleural effusion noted. 2. Tiny residual left apical pneumothorax cannot be excluded. This appears decreased in size from prior PET-CT of 11/16/2019. Left posterior fifth and sixth rib fractures again noted. Electronically Signed   By: Marcello Moores  Register   On: 11/17/2019 07:43   ECHOCARDIOGRAM COMPLETE  Result Date: 11/17/2019   ECHOCARDIOGRAM REPORT   Patient Name:   [name] [name] Date of Exam: 11/17/2019 Medical Rec #:  [id number]       Height:       62.0 in Accession #:    [id number]      Weight:       135.0 lb Date of Birth:  09-14-1941       BSA:          1.62 m Patient Age:    9 years        BP:           109/66 mmHg Patient Gender: F               HR:           73 bpm. Exam Location:  Inpatient Procedure: Limited Echo and Limited Color Doppler Indications:    CHF-Acute Diastolic 166.06 / T01.60  History:        Patient has prior history of Echocardiogram examinations, most                 recent 11/16/2019. Risk  Factors:Hypertension and Dyslipidemia.                 Lung cancer, pulmonary fibrosis.  Sonographer:    Darlina Sicilian RDCS Referring Phys: Portal  1. Left ventricular ejection fraction, by visual estimation, is 60 to 65%. The left ventricle has normal function. Mildly increased left ventricular posterior wall thickness. There is severely increased left ventricular hypertrophy of the basal septum.  2. Global right ventricle has normal systolic function.The right ventricular size is normal. No increase in right ventricular wall thickness.  3. Left atrial size was normal.  4. Right atrial size was normal.  5. The mitral valve is normal in structure. No evidence of mitral valve regurgitation. No evidence of mitral stenosis.  6. The tricuspid  valve is normal in structure. Tricuspid valve regurgitation is trivial.  7. The aortic valve is normal in structure. Aortic valve regurgitation is not visualized. No evidence of aortic valve sclerosis or stenosis.  8. The pulmonic valve was normal in structure. Pulmonic valve regurgitation is not visualized.  9. Normal pulmonary artery systolic pressure. 10. The inferior vena cava is normal in size with greater than 50% respiratory variability, suggesting right atrial pressure of 3 mmHg. 11. The left ventricle has no regional wall motion abnormalities. 12. Left ventricular diastolic function could not be evaluated. FINDINGS  Left Ventricle: Left ventricular ejection fraction, by visual estimation, is 60 to 65%. The left ventricle has normal function. The left ventricle has no regional wall motion abnormalities. Mildly increased left ventricular posterior wall thickness. There is severely increased left ventricular hypertrophy. Left ventricular diastolic function could not be evaluated. Indeterminate filling pressures. Right Ventricle: The right ventricular size is normal. No increase in right ventricular wall thickness. Global RV systolic function is has normal  systolic function. The tricuspid regurgitant velocity is 2.50 m/s, and with an assumed right atrial pressure  of 3 mmHg, the estimated right ventricular systolic pressure is normal at 28.0 mmHg. Left Atrium: Left atrial size was normal in size. Right Atrium: Right atrial size was normal in size Pericardium: There is no evidence of pericardial effusion. Mitral Valve: The mitral valve is normal in structure. No evidence of mitral valve regurgitation. No evidence of mitral valve stenosis by observation. Tricuspid Valve: The tricuspid valve is normal in structure. Tricuspid valve regurgitation is trivial. Aortic Valve: The aortic valve is normal in structure. Aortic valve regurgitation is not visualized. The aortic valve is structurally normal, with no evidence of sclerosis or stenosis. Pulmonic Valve: The pulmonic valve was normal in structure. Pulmonic valve regurgitation is not visualized. Pulmonic regurgitation is not visualized. Aorta: The aortic root, ascending aorta and aortic arch are all structurally normal, with no evidence of dilitation or obstruction. Venous: The inferior vena cava is normal in size with greater than 50% respiratory variability, suggesting right atrial pressure of 3 mmHg. IAS/Shunts: No atrial level shunt detected by color flow Doppler. There is no evidence of a patent foramen ovale. No ventricular septal defect is seen or detected. There is no evidence of an atrial septal defect.  LEFT VENTRICLE PLAX 2D LVIDd:         2.90 cm LVIDs:         1.70 cm LV PW:         1.40 cm LV IVS:        1.70 cm LVOT diam:     1.70 cm LV SV:         24 ml LV SV Index:   14.45 LVOT Area:     2.27 cm  LEFT ATRIUM           Index LA diam:      2.60 cm 1.61 cm/m LA Vol (A2C): 34.3 ml 21.21 ml/m LA Vol (A4C): 42.4 ml 26.21 ml/m  TRICUSPID VALVE TR Peak grad:   25.0 mmHg TR Vmax:        250.00 cm/s  SHUNTS Systemic Diam: 1.70 cm  Fransico Him MD Electronically signed by Fransico Him MD Signature Date/Time:  11/17/2019/11:50:50 AM    Final    IR THORACENTESIS ASP PLEURAL SPACE W/IMG GUIDE  Result Date: 11/17/2019 INDICATION: History of lung cancer. Shortness of breath. Left hydropneumothorax seen on recent imaging. Request diagnostic and therapeutic thoracentesis. EXAM: ULTRASOUND GUIDED LEFT  THORACENTESIS MEDICATIONS: None. COMPLICATIONS: None immediate. PROCEDURE: An ultrasound guided thoracentesis was thoroughly discussed with the patient and questions answered. The benefits, risks, alternatives and complications were also discussed. The patient understands and wishes to proceed with the procedure. Written consent was obtained. Ultrasound was performed to localize and mark an adequate pocket of fluid in the left chest. The area was then prepped and draped in the normal sterile fashion. 1% Lidocaine was used for local anesthesia. Under ultrasound guidance a 6 Fr Safe-T-Centesis catheter was introduced. Thoracentesis was performed. The catheter was removed and a dressing applied. FINDINGS: A total of approximately 470 mL of serosanguineous fluid was removed. Samples were sent to the laboratory as requested by the clinical team. IMPRESSION: Successful ultrasound guided left thoracentesis yielding 470 mL of pleural fluid. Read by: Ascencion Dike PA-C Electronically Signed   By: Aletta Edouard M.D.   On: 11/17/2019 09:28     DG Chest 1 View  Result Date: 11/17/2019 CLINICAL DATA:  Status post thoracentesis on the left EXAM: CHEST  1 VIEW COMPARISON:  November 17, 2019 radiograph obtained earlier in the day; PET-CT November 16, 2019 FINDINGS: There is a stable small left apical pneumothorax without tension component. Left pleural effusion is smaller following thoracentesis. There is underlying airspace consolidation in the left upper and mid lung regions. There is underlying fibrosis. No new opacity is evident. Heart is upper normal in size with pulmonary vascularity within normal limits. There is aortic  atherosclerosis. No appreciable adenopathy evident by radiography. Known bony metastases are not well seen by radiography. IMPRESSION: Stable left apical pneumothorax without appreciable tension component. Left pleural effusion smaller following thoracentesis. Airspace opacity in the left upper to mid lung region remains. Underlying fibrosis. No new opacity. Stable cardiac silhouette. Aortic Atherosclerosis (ICD10-I70.0). Electronically Signed   By: Lowella Grip III M.D.   On: 11/17/2019 09:22   DG Chest 2 View  Result Date: 11/20/2019 CLINICAL DATA:  Lung carcinoma with hypoxia EXAM: CHEST - 2 VIEW COMPARISON:  November 18, 2019 FINDINGS: There is loculated effusion on the left tracking into the left apex. The pneumothorax seen in this area 1 day prior is not evident on this study. There is fibrosis throughout both lungs. There is consolidation in the left mid lung region, stable, with potential underlying mass. No new opacity is evident in the lung parenchyma. Heart is upper normal in size with pulmonary vascularity normal. No adenopathy is evident by radiography. There is aortic atherosclerosis. Several rib fractures are noted on the left, stable. IMPRESSION: Loculated pleural effusion in the left lateral chest and apex region. The pneumothorax in this area seen 1 day prior is not appreciable, although fluid could be present superimposed on pneumothorax in this area. Consolidation with questionable underlying mass in the left mid lung region is stable. There is underlying fibrosis. No new parenchymal lung opacity. Stable cardiac silhouette.  Aortic Atherosclerosis (ICD10-I70.0). Electronically Signed   By: Lowella Grip III M.D.   On: 11/20/2019 07:36   MR THORACIC SPINE W WO CONTRAST  Result Date: 11/17/2019 CLINICAL DATA:  Lung cancer. Abnormal uptake into the thoracic spine on the PET scan. EXAM: MRI THORACIC WITHOUT AND WITH CONTRAST TECHNIQUE: Multiplanar and multiecho pulse sequences of  the thoracic spine were obtained without and with intravenous contrast. CONTRAST:  5.16m GADAVIST GADOBUTROL 1 MMOL/ML IV SOLN COMPARISON:  PET scan 11/16/2019. CT of the chest 12/06/2017 and 12/07/2018 FINDINGS: MRI THORACIC SPINE FINDINGS Alignment: Slight degenerative retrolisthesis is present at T11-12 and T12-L1.  No other significant listhesis is present. Levoconvex curvature is centered at T7-8. Vertebrae: Abnormal signal and enhancement is present at T3, T4, T5, and T6, consistent with metastatic disease to the spine. Enhancement occupies the posterior 2/3 of the vertebral body at T3 without significant extension in the pedicles. There is diffuse enhancement in the posterior 1/3 of the vertebral body at T4 without abnormal signal and enhancement extending into the left pedicle. More heterogeneous posterior enhancement is present at T5 and T6 with abnormal signal and enhancement extending into the left pedicle of both levels. No pathologic fractures are present. No definite extraosseous tumor or enhancement is present. No other focal areas of enhancement are present within the thoracic or visualized upper lumbar spine. The scout image demonstrates signal change at C6 and possibly C5, is consistent with additional metastases. There is no definite enhancement in this region. This likely represents more typical degenerative change. Cord:  Normal signal is present in the thoracic spinal cord. Paraspinal and other soft tissues: Bilateral pleural effusions are present. Post obstructive disease is present in the left lung. Bilateral airspace opacities are noted. Complex cysts are again noted in the left kidney. Upper abdomen is otherwise unremarkable. Disc levels: T1-2: A broad-based disc protrusion is asymmetric to the right. Moderate right and mild left foraminal narrowing is present. T2-3: Broad-based disc bulging and facet disease contributes to mild foraminal narrowing bilaterally, right greater than left. T3-4:  Facet spurring contributes to mild right foraminal narrowing. T4-5: No significant disc disease. Facet disease is worse on the right. T5-6: No significant disc disease. Facet hypertrophy is worse on the left. T6-7: A prominent right paramedian disc extrusion is present. Disc material extends 9 mm above the inferior endplate of T6. There is contact and slight distortion of the ventral surface the cord without abnormal signal. T7-8: A shallow central disc protrusion is present. No significant stenosis is present. T8-9: A left paramedian disc protrusion is present. There is effacement of ventral CSF without significant stenosis. T9-10: A far right lateral disc protrusion extends into the foramen with moderate right and mild left foraminal narrowing. T10-11: Facet spurring contributes to mild right foraminal narrowing. T11-12: A broad-based disc protrusion is present without significant stenosis. T12-L1: A broad-based disc protrusion is present. Mild foraminal narrowing is noted bilaterally. IMPRESSION: 1. Abnormal signal and enhancement at T3, T4, T5, and T6 consistent with metastatic disease to the spine. 2. No pathologic fractures are present. 3. Multilevel spondylosis of the lumbar spine as described. 4. Bilateral lung disease and effusions, left greater than right. Please see PET scan from yesterday for further detail. 5. Complex left renal cysts are stable. Electronically Signed   By: San Morelle M.D.   On: 11/17/2019 05:43   NM PET Image Restag (PS) Skull Base To Thigh  Result Date: 11/16/2019 CLINICAL DATA:  Subsequent treatment strategy for lung cancer. EXAM: NUCLEAR MEDICINE PET SKULL BASE TO THIGH TECHNIQUE: 5.7 mCi F-18 FDG was injected intravenously. Full-ring PET imaging was performed from the skull base to thigh after the radiotracer. CT data was obtained and used for attenuation correction and anatomic localization. Fasting blood glucose: 128 mg/dl COMPARISON:  05/24/2017 FINDINGS:  Mediastinal blood pool activity: SUV max 2.4 Liver activity: SUV max NA NECK: No hypermetabolic lymph nodes in the neck. Incidental CT findings: none CHEST: Multiple areas of hypermetabolism are identified in the anterior left lung including a 3.3 x 2.1 cm consolidative opacity in the medial left upper lung adjacent to the mediastinum (see image 53/4)  with SUV max = 5.6. Consolidative opacity seen relatively diffusely in the left parahilar lung shows hypermetabolic FDG uptake with SUV max = 5.6. Focal uptake in the inferior left hilum demonstrates SUV max = 5.1. Focal uptake in the region of the right atrium noted without underlying lesion evident on this noncontrast CT. There is some soft tissue attenuation anterior to the ascending aorta that stable since prior study. Bandlike opacity anterior right upper lobe (image 52/series 4) shows low level hypermetabolism with SUV max = 3.9. Focal hypermetabolism noted in the right hilum without lymphadenopathy evident by CT. Similar focal hypermetabolism identified inferior right hilum. There is a moderate left pleural fluid collection with gas identified in the left anterior pleural space. Low level diffuse FDG accumulation is identified along the left pleural surface. 9 mm short axis left supraclavicular node (34/4) demonstrates SUV max = 3.9. Focus of hypermetabolism identified in the left T11 paraspinal tissues without underlying mass lesion evident by CT. SUV max = 4.4. Incidental CT findings: There is abdominal aortic atherosclerosis without aneurysm. Acute to subacute displaced fractures noted left lateral fifth rib and posterolateral left sixth rib. Extensive chronic interstitial/fibrotic lung disease noted bilaterally. ABDOMEN/PELVIS: No abnormal hypermetabolic activity within the liver, pancreas, adrenal glands, or spleen. No hypermetabolic lymph nodes in the abdomen or pelvis. Incidental CT findings: There is abdominal aortic atherosclerosis without aneurysm.  Layering tiny gallstones evident. Multiple left renal cysts of varying size and attenuation some with layering calcific debris or hemorrhage. SKELETON: Hypermetabolism is identified in the posterior aspect of the T3 and T4 vertebral bodies although no underlying bony lesion is evident. SUV max = 8.4 at the level of T3. Incidental CT findings: Diffuse degenerative changes noted in the spine. Acute to subacute left rib fractures as above. Patient is status post right hip replacement. IMPRESSION: 1. Multiple areas of low level hypermetabolism associated with consolidative opacity in the left upper and lower lung with hypermetabolic left supraclavicular lymph node, focal hypermetabolism in the hilar regions and anterior mediastinum. Imaging features could be related to diffuse neoplasm (adenocarcinoma) with metastatic disease. Infectious/inflammatory etiology not excluded. 2. Moderate to large left hydropneumothorax, likely posttraumatic as there are acute to subacute displaced fractures of the left fourth and fifth ribs. 3. Bandlike opacity in the anterior right lung with low level hypermetabolism, indeterminate. 4. Hypermetabolic focus in the left 11 paraspinal tissues without underlying mass lesion evident by CT. 5. Hypermetabolic activity in the posterior T3 and T4 vertebral bodies without associated mass lesion evident by CT. Thoracic spine MRI with and without contrast could be used to further evaluate as clinically warranted. 6. Advanced chronic interstitial lung disease with fibrotic component. 7.  Aortic Atherosclerois (ICD10-170.0) These results will be called to the ordering clinician or representative by the Radiologist Assistant, and communication documented in the PACS or zVision Dashboard. Electronically Signed   By: Misty Stanley M.D.   On: 11/16/2019 15:09   DG CHEST PORT 1 VIEW  Result Date: 11/18/2019 CLINICAL DATA:  Hydropneumothorax. EXAM: PORTABLE CHEST 1 VIEW COMPARISON:  Chest x-ray from  yesterday. FINDINGS: Stable cardiomediastinal silhouette. Unchanged small left apical pneumothorax. Unchanged pleural thickening/fluid along the left lateral chest wall adjacent to displaced fifth and sixth rib fractures. Advanced chronic interstitial lung disease again noted. Unchanged consolidation in the left mid lung. No acute osseous abnormality. IMPRESSION: 1. Unchanged small left hydropneumothorax and consolidation in the left mid lung. Electronically Signed   By: Titus Dubin M.D.   On: 11/18/2019 09:37   DG  CHEST PORT 1 VIEW  Result Date: 11/17/2019 CLINICAL DATA:  History of broken ribs and pneumothorax. Pleural effusion. EXAM: PORTABLE CHEST 1 VIEW COMPARISON:  PET CT 11/16/2019.  Chest x-ray 09/22/2017. FINDINGS: Mediastinum stable. Stable cardiomegaly. Dense left mid lung infiltrate/mass noted. Chronic interstitial prominence noted. Small left pleural effusion noted. Tiny left apical pneumothorax cannot be excluded. This appears decreased in size from prior PET-CT of 11/16/2019. Left posterior fifth and sixth rib fractures again noted. IMPRESSION: 1. Dense left mid lung field infiltrate/mass noted. Reference is made to recent PET-CT report of 11/16/2019. Chronic interstitial prominence noted. Small left pleural effusion noted. 2. Tiny residual left apical pneumothorax cannot be excluded. This appears decreased in size from prior PET-CT of 11/16/2019. Left posterior fifth and sixth rib fractures again noted. Electronically Signed   By: Marcello Moores  Register   On: 11/17/2019 07:43   ECHOCARDIOGRAM COMPLETE  Result Date: 11/17/2019   ECHOCARDIOGRAM REPORT   Patient Name:   [name] [name] Date of Exam: 11/17/2019 Medical Rec #:  [id number]       Height:       62.0 in Accession #:    [id number]      Weight:       135.0 lb Date of Birth:  [birth year]       BSA:          1.62 m Patient Age:    40 years        BP:           109/66 mmHg Patient Gender: F               HR:           73 bpm. Exam Location:   Inpatient Procedure: Limited Echo and Limited Color Doppler Indications:    CHF-Acute Diastolic 213.08 / M57.84  History:        Patient has prior history of Echocardiogram examinations, most                 recent 11/16/2019. Risk Factors:Hypertension and Dyslipidemia.                 Lung cancer, pulmonary fibrosis.  Sonographer:    Darlina Sicilian RDCS Referring Phys: [place]  1. Left ventricular ejection fraction, by visual estimation, is 60 to 65%. The left ventricle has normal function. Mildly increased left ventricular posterior wall thickness. There is severely increased left ventricular hypertrophy of the basal septum.  2. Global right ventricle has normal systolic function.The right ventricular size is normal. No increase in right ventricular wall thickness.  3. Left atrial size was normal.  4. Right atrial size was normal.  5. The mitral valve is normal in structure. No evidence of mitral valve regurgitation. No evidence of mitral stenosis.  6. The tricuspid valve is normal in structure. Tricuspid valve regurgitation is trivial.  7. The aortic valve is normal in structure. Aortic valve regurgitation is not visualized. No evidence of aortic valve sclerosis or stenosis.  8. The pulmonic valve was normal in structure. Pulmonic valve regurgitation is not visualized.  9. Normal pulmonary artery systolic pressure. 10. The inferior vena cava is normal in size with greater than 50% respiratory variability, suggesting right atrial pressure of 3 mmHg. 11. The left ventricle has no regional wall motion abnormalities. 12. Left ventricular diastolic function could not be evaluated. FINDINGS  Left Ventricle: Left ventricular ejection fraction, by visual estimation, is 60 to 65%. The left ventricle has normal function. The left  ventricle has no regional wall motion abnormalities. Mildly increased left ventricular posterior wall thickness. There is severely increased left ventricular hypertrophy. Left  ventricular diastolic function could not be evaluated. Indeterminate filling pressures. Right Ventricle: The right ventricular size is normal. No increase in right ventricular wall thickness. Global RV systolic function is has normal systolic function. The tricuspid regurgitant velocity is 2.50 m/s, and with an assumed right atrial pressure  of 3 mmHg, the estimated right ventricular systolic pressure is normal at 28.0 mmHg. Left Atrium: Left atrial size was normal in size. Right Atrium: Right atrial size was normal in size Pericardium: There is no evidence of pericardial effusion. Mitral Valve: The mitral valve is normal in structure. No evidence of mitral valve regurgitation. No evidence of mitral valve stenosis by observation. Tricuspid Valve: The tricuspid valve is normal in structure. Tricuspid valve regurgitation is trivial. Aortic Valve: The aortic valve is normal in structure. Aortic valve regurgitation is not visualized. The aortic valve is structurally normal, with no evidence of sclerosis or stenosis. Pulmonic Valve: The pulmonic valve was normal in structure. Pulmonic valve regurgitation is not visualized. Pulmonic regurgitation is not visualized. Aorta: The aortic root, ascending aorta and aortic arch are all structurally normal, with no evidence of dilitation or obstruction. Venous: The inferior vena cava is normal in size with greater than 50% respiratory variability, suggesting right atrial pressure of 3 mmHg. IAS/Shunts: No atrial level shunt detected by color flow Doppler. There is no evidence of a patent foramen ovale. No ventricular septal defect is seen or detected. There is no evidence of an atrial septal defect.  LEFT VENTRICLE PLAX 2D LVIDd:         2.90 cm LVIDs:         1.70 cm LV PW:         1.40 cm LV IVS:        1.70 cm LVOT diam:     1.70 cm LV SV:         24 ml LV SV Index:   14.45 LVOT Area:     2.27 cm  LEFT ATRIUM           Index LA diam:      2.60 cm 1.61 cm/m LA Vol (A2C): 34.3  ml 21.21 ml/m LA Vol (A4C): 42.4 ml 26.21 ml/m  TRICUSPID VALVE TR Peak grad:   25.0 mmHg TR Vmax:        250.00 cm/s  SHUNTS Systemic Diam: 1.70 cm  Fransico Him MD Electronically signed by Fransico Him MD Signature Date/Time: 11/17/2019/11:50:50 AM    Final    IR THORACENTESIS ASP PLEURAL SPACE W/IMG GUIDE  Result Date: 11/17/2019 INDICATION: History of lung cancer. Shortness of breath. Left hydropneumothorax seen on recent imaging. Request diagnostic and therapeutic thoracentesis. EXAM: ULTRASOUND GUIDED LEFT THORACENTESIS MEDICATIONS: None. COMPLICATIONS: None immediate. PROCEDURE: An ultrasound guided thoracentesis was thoroughly discussed with the patient and questions answered. The benefits, risks, alternatives and complications were also discussed. The patient understands and wishes to proceed with the procedure. Written consent was obtained. Ultrasound was performed to localize and mark an adequate pocket of fluid in the left chest. The area was then prepped and draped in the normal sterile fashion. 1% Lidocaine was used for local anesthesia. Under ultrasound guidance a 6 Fr Safe-T-Centesis catheter was introduced. Thoracentesis was performed. The catheter was removed and a dressing applied. FINDINGS: A total of approximately 470 mL of serosanguineous fluid was removed. Samples were sent to the laboratory as requested  by the clinical team. IMPRESSION: Successful ultrasound guided left thoracentesis yielding 470 mL of pleural fluid. Read by: Ascencion Dike PA-C Electronically Signed   By: Aletta Edouard M.D.   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  She now has an abnormal PET scan with  hypermetabolism in the left upper and left lower lung with hypermetabolic left supraclavicular lymph node, focal hypermetabolism in the hilar regions and anterior mediastinum, moderate to large hydropneumothorax, hypermetabolic focus at the left paraspinal tissues, hypermetabolic activity in the posterior T3 and T4 vertebral bodies which is concerning for metastatic cancer.  This is likely a recurrent metastatic lung cancer as opposed to a new primary.  Cytology from pleural fluid is pending.  Work-up to date has been discussed with the patient.  We discussed that findings are highly suspicious for metastatic cancer, likely lung cancer.  We discussed that we are awaiting cytology from the pleural fluid to confirm the diagnosis.  If we are unable to confirm the diagnosis from her cytology, may need to consider an ultrasound-guided biopsy of the left supraclavicular lymph node.  Once a diagnosis is confirmed, we will have a more detailed discussion regarding treatment options.  I would also recommend completing the patient staging work-up with an MRI of the brain with and without contrast.  Thank you for this referral.   Mikey Bussing, DNP, AGPCNP-BC, AOCNP  ADDENDUM: Hematology/Oncology Attending: I had a face-to-face encounter with the patient today.  I recommended her care plan.  I agree with the above note.  This is a very pleasant 78 years old white female with history of stage I non-small cell lung cancer, squamous cell carcinoma involving the left upper lobe in 2014 status post SBRT.  The patient has another stage Ia non-small cell lung cancer, adenocarcinoma involving the left lower lobe in 2015 status post SBRT.  She has been in observation since that time.  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  Over the last few weeks the patient has been complaining of increasing shortness of breath as well as cough and  failure to expand her chest.  She was seen by her primary care physician and referred to Dr. Elsworth Soho for further evaluation.  A PET scan was performed on November 16, 2019 and that showed multiple areas of hypermetabolism identified in the anterior left lung including 3.3 x 2.1 cm consolidative opacity in the medial left upper lung adjacent to the mediastinum with SUV max of 5.6.  There was also consolidative opacities seen relatively diffusely in the left perihilar lung with hypermetabolic FDG uptake of SUV max 5.6.  There was also focal uptake in the inferior left hilum with SUV max of 5.1.  The scan also showed moderate left pleural effusion collection with gas identified in the left anterior pleural space.  There was also 0.9 cm short axis left supraclavicular lymph node with SUV max of 3.9 and focus hypermetabolism identified in the left T11 paraspinal tissues without underlying mass lesion on the CT scan.  There was hypermetabolism identified in the posterior aspect of T3 and T4 vertebral bodies with no underlying bony lesion  with SUV max of 8.4 at the level of T3.  The patient underwent ultrasound-guided left thoracentesis with drainage of 470 mL of serosanguineous fluid.  The final cytology is still pending.  She also had MRI of the thoracic spine that showed abnormal signal and enhancement at T3, T4, T5 and T6 consistent with metastatic disease to the spine. I was consulted today to evaluate the patient for her condition and give recommendation regarding her treatment options. When seen today she is feeling fine except for the shortness of breath increased with exertion with mild cough and no hemoptysis. I had a lengthy discussion with the patient today about her condition and treatment options.  I recommended for the patient to complete the staging work-up by ordering MRI of the brain. We will wait for the final pathology to confirm the tissue diagnosis and type of non-small cell lung cancer.  If the  final pathology is consistent with adenocarcinoma, will send either the tissue block or blood test for molecular studies and PD-L1 expression if there is sufficient material. I will arrange for the patient to see me at the cancer center within 1 week for more detailed discussion of her treatment options including systemic chemotherapy/immunotherapy versus palliative care. The patient is in agreement with the current plan. Thank you so much for allowing me to participate in the care of Ms. Luger, I will continue to follow up the patient with you and assist in her management on as-needed basis. Disclaimer: This note was dictated with voice recognition software. Similar sounding words can inadvertently be transcribed and may be missed upon review. Eilleen Kempf, MD

## 2019-11-20 NOTE — Progress Notes (Signed)
Pt noted coming into room with transport states she has been to testing. Pt removed from unit without knowledge to this nurse nor off going nurse. Nurse, adult aware.

## 2019-11-20 NOTE — Care Management Important Message (Signed)
Important Message  Patient Details  Name: [name] [name] MRN: [id number] Date of Birth: [birth year]   Medicare Important Message Given:  Yes     Mistina Coatney [address] 11/20/2019, 12:40 PM

## 2019-11-21 ENCOUNTER — Inpatient Hospital Stay (HOSPITAL_COMMUNITY): Payer: Medicare Other

## 2019-11-21 DIAGNOSIS — J910 Malignant pleural effusion: Secondary | ICD-10-CM

## 2019-11-21 LAB — BASIC METABOLIC PANEL
Anion gap: 11 (ref 5–15)
BUN: 5 mg/dL — ABNORMAL LOW (ref 8–23)
CO2: 33 mmol/L — ABNORMAL HIGH (ref 22–32)
Calcium: 9.4 mg/dL (ref 8.9–10.3)
Chloride: 85 mmol/L — ABNORMAL LOW (ref 98–111)
Creatinine, Ser: 0.53 mg/dL (ref 0.44–1.00)
GFR calc Af Amer: >60 mL/min (ref >60)
GFR calc non Af Amer: >60 mL/min (ref >60)
Glucose, Bld: 106 mg/dL — ABNORMAL HIGH (ref 70–99)
Potassium: 3.8 mmol/L (ref 3.5–5.1)
Sodium: 129 mmol/L — ABNORMAL LOW (ref 135–145)

## 2019-11-21 LAB — CBC
HCT: 35.5 % — ABNORMAL LOW (ref 36.0–46.0)
Hemoglobin: 12.1 g/dL (ref 12.0–15.0)
MCH: 32 pg (ref 26.0–34.0)
MCHC: 34.1 g/dL (ref 30.0–36.0)
MCV: 93.9 fL (ref 80.0–100.0)
Platelets: 400 10*3/uL (ref 150–400)
RBC: 3.78 MIL/uL — ABNORMAL LOW (ref 3.87–5.11)
RDW: 12.7 % (ref 11.5–15.5)
WBC: 11.4 10*3/uL — ABNORMAL HIGH (ref 4.0–10.5)
nRBC: 0 % (ref 0.0–0.2)

## 2019-11-21 LAB — CYTOLOGY - NON PAP

## 2019-11-21 MED ORDER — ENOXAPARIN SODIUM 40 MG/0.4ML ~~LOC~~ SOLN: 40.0000 mg | SUBCUTANEOUS | Status: DC

## 2019-11-21 MED ORDER — GADOBUTROL 1 MMOL/ML IV SOLN
6.0000 mL | Freq: Once | INTRAVENOUS | Status: AC | PRN
  Administered 2019-11-21: 6 mL via INTRAVENOUS

## 2019-11-21 MED ORDER — TRAMADOL HCL 50 MG PO TABS: 50.0000 mg | ORAL_TABLET | Freq: Four times a day (QID) | ORAL | 0 refills | Status: AC | PRN

## 2019-11-21 MED FILL — traMADol HCL 50 MG TABS: 50 | 7 days supply | Qty: 30 | Fill #0

## 2019-11-21 NOTE — Progress Notes (Signed)
Name: [name] [name] MRN: [id number] DOB: May 12, 1941    ADMISSION DATE:  11/16/2019 CONSULTATION DATE:  11/21/2019   REFERRING MD :  Derrek Monaco  CHIEF COMPLAINT:  Abnormal PET scan   BRIEF PATIENT DESCRIPTION: 78 year old woman with prior history of lung cancer status post SBRT and IPF with chronic hypoxic respiratory failure on 4 L O2, admitted with abnormal PET scan showing left hydropneumothorax  SIGNIFICANT EVENTS  12/17 hospital admission  STUDIES:  PET 12/17 >> Multiple areas of low level hypermetabolism associated with consolidative opacity in the left upper and lower lung with hypermetabolic left supraclavicular lymph node, focal hypermetabolism in the hilar regions and anterior mediastinum. Hypermetabolic activity in the posterior T3 and T4 vertebral bodies without associated mass lesion evident by CT 12/18 MR T-spine >>Abnormal signal and enhancement at T3, T4, T5, and T6 consistent with metastatic disease to the spine  Left thoracentesis 12/18 >> - - Malignant cells consistent with non-small cell carcinoma.  The malignant cells are positive with TTF-1 and cytokeratin 7 and are  negative with Napsin-A, cytokeratin 5 6, calponin and WT 1. The  immunophenotype is consistent with lung adenocarcinoma.    PMH - IPF and chronic hypoxic respiratory failure on 3 to 4 L oxygen. 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 Left lower lobe adenocarcinoma status post SBRT completed 01/29/2014.  Her last HRCT was 11/2018 which showed progression of UIP since 2019    SUBJECTIVE:  Pt in MRI.  Per RN pt feeling well, wants to go home.   VITAL SIGNS: Temp:  [97.9 F (36.6 C)-98.3 F (36.8 C)] 97.9 F (36.6 C) (12/22 0750) Pulse Rate:  [101-113] 113 (12/22 0750) Resp:  [17-20] 17 (12/22 0750) BP:  (135-175)/(79-89) 152/80 (12/22 0750) SpO2:  [91 %-96 %] 91 % (12/22 0750) Weight:  [59.1 kg] 59.1 kg (12/22 0329)  PHYSICAL EXAMINATION: Pt in MRI -  Pleural fluid results reviewed   Recent Labs  Lab 11/19/19 0428 11/20/19 0251 11/21/19 0215  NA 128* 128* 129*  K 3.5 3.8 3.8  CL 87* 87* 85*  CO2 31 32 33*  BUN 7* 5* 5*  CREATININE 0.48 0.49 0.53  GLUCOSE 107* 100* 106*   Recent Labs  Lab 11/17/19 0156 11/18/19 0314 11/21/19 0215  HGB 12.8 12.4 12.1  HCT 36.7 35.6* 35.5*  WBC 12.2* 11.9* 11.4*  PLT 437* 403* 400   DG Chest 2 View  Result Date: 11/20/2019 CLINICAL DATA:  Lung carcinoma with hypoxia EXAM: CHEST - 2 VIEW COMPARISON:  November 18, 2019 FINDINGS: There is loculated effusion on the left tracking into the left apex. The pneumothorax seen in this area 1 day prior is not evident on this study. There is fibrosis throughout both lungs. There is consolidation in the left mid lung region, stable, with potential underlying mass. No new opacity is evident in the lung parenchyma. Heart is upper normal in size with pulmonary vascularity normal. No adenopathy is evident by radiography. There is aortic atherosclerosis. Several rib fractures are noted on the left, stable. IMPRESSION: Loculated pleural effusion in the left lateral chest and apex region. The pneumothorax in this area seen 1 day prior is not appreciable, although fluid could be present superimposed on pneumothorax in this area. Consolidation with questionable underlying mass in the left mid lung region is stable. There is  underlying fibrosis. No new parenchymal lung opacity. Stable cardiac silhouette.  Aortic Atherosclerosis (ICD10-I70.0). Electronically Signed   By: Lowella Grip III M.D.   On: 11/20/2019 07:36    ASSESSMENT / PLAN:  Metastatic lung cancer - non-small cell lung ca Pleural effusion, exudative, malignant Left hydropneumothorax   Recommend Cytology c/w non-small cell but not enough sample  for EGFR markers - will need further tissue sampling via FNA supraclavicular LN in IR - could be done as outpt but would likely be easier and faster to proceed while inpt if able Appreciate radiation oncology input - has outpt RadOnc f/u scheduled 12/29  IPF -continue O FEV100 twice daily Chronic hypoxic respiratory failure-O2 2 to 4 L  Dr. Elsworth Soho discussed with pathology   Bedside nurse updated   Nickolas Madrid, NP Pulmonary/Critical Care Medicine  11/21/2019  2:01 PM

## 2019-11-21 NOTE — Discharge Summary (Signed)
Physician Discharge Summary  [name] [name] ION:[id number] DOB: [birth year] DOA: 11/16/2019  PCP: Alroy Dust, L.Marlou Sa, MD  Admit date: 11/16/2019 Discharge date: 11/21/2019  Admitted From: Home Disposition:  Home   Recommendations for Outpatient Follow-up:  1. Follow up with PCP in 1 week 2. Follow up with Radiation oncology 12/29 3. Follow up with Pulmonology 1/15 4. Follow up with Oncology  5. 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 Elsworth Soho who presents secondary to PET scan finding of hydropneumothorax.  PET scan obtained as an outpatient revealed multiple areas of hypermetabolism associated with consolidative opacity in the left lung with hypermetabolic left supraclavicular lymph node.  Also revealed moderate to large left hydropneumothorax.  Patient underwent thoracentesis. Rest as below.   Discharge Diagnoses:  Principal Problem:   Hydropneumothorax Active Problems:   Pulmonary fibrosis (HCC)   Hypertension   Idiopathic pulmonary fibrosis (HCC)   Dyspnea   Metastatic cancer to bone (HCC)   Malignant pleural effusion   Acute on chronic hypoxemic respiratory failure secondary to hydropneumothorax -Chronically on 2L [place] O2 at baseline   -Covid screening negative -Status post thoracentesis 12/18. Stable left apical pneumothorax s/p thoracentesis -By Light's criteria, fluid is exudative in nature -Cytology positive for non-small cell lung cancer  -Repeat CXR 12/19 shows unchanged small left hydropneumothorax and  consolidation in the left midlung -Repeat CXR 12/21 shows loculated pleural effusion in the left lateral chest and apex region. The pneumothorax in this area seen 1 day prior is not appreciable, although fluid could be present superimposed on pneumothorax in this area. -Appreciate pulmonology   Concern for metastatic disease with history of lung cancer in the past -PET: multiple areas of low level hypermetabolism associated with consolidative opacity in the left upper and lower lung with hypermetabolic left supraclavicular lymph node, focal hypermetabolism in the hilar regions and anterior mediastinum. -MRI thoracic spine: abnormal signal and enhancement at T3, T4, T5, and T6 consistent with metastatic disease to the spine -Radiation oncology consulted; they have set up an outpatient appointment for 12/29 -Appreciate oncology consultation, follow up as outpatient  -Cytology positive for non-small cell lung cancer  -MRI brain negative for metastatic disease  -Patient to set up for outpatient lymph node biopsy by IR. They will call her to arrange appointment  Hyponatremia -Hold HCTZ -Stable, monitor BMP  Acute/subacute rib fracture -IS   ILD/IPF -Ofev   Essential hypertension -HCTZ held due to hyponatremia -Amlodipine  Hyperlipidemia -Lipitor    Discharge Instructions  Discharge Instructions    Call MD for:  difficulty breathing, headache or visual disturbances   Complete by: As directed    Call MD for:  extreme fatigue   Complete by: As directed    Call MD for:  persistant dizziness or light-headedness   Complete by: As directed    Call MD for:  persistant nausea and vomiting   Complete by: As directed    Call MD for:  severe uncontrolled pain   Complete by: As directed    Call MD for:  temperature >100.4   Complete by: As directed    Diet  general   Complete by: As directed    Discharge instructions   Complete by: As directed    You were cared for by a  hospitalist during your hospital stay. If you have any questions about your discharge medications or the care you received while you were in the hospital after you are discharged, you can call the unit and ask to speak with the hospitalist on call if the hospitalist that took care of you is not available. Once you are discharged, your primary care physician will handle any further medical issues. Please note that NO REFILLS for any discharge medications will be authorized once you are discharged, as it is imperative that you return to your primary care physician (or establish a relationship with a primary care physician if you do not have one) for your aftercare needs so that they can reassess your need for medications and monitor your lab values.   Increase activity slowly   Complete by: As directed      Allergies as of 11/21/2019      Reactions   Penicillins Hives, Swelling, Rash   Has patient had a PCN reaction causing immediate rash, facial/tongue/throat swelling, SOB or lightheadedness with hypotension: Yes Has patient had a PCN reaction causing severe rash involving mucus membranes or skin necrosis: No Has patient had a PCN reaction that required hospitalization: No Has patient had a PCN reaction occurring within the last 10 years: No If all of the above answers are "NO", then may proceed with Cephalosporin use.      Medication List    STOP taking these medications   hydrochlorothiazide 25 MG tablet Commonly known as: HYDRODIURIL     TAKE these medications   acetaminophen 500 MG tablet Commonly known as: TYLENOL Take 500-1,000 mg by mouth every 6 (six) hours as needed for moderate pain.   amLODipine 10 MG tablet Commonly known as: NORVASC Take 1 tablet (10 mg total) by mouth daily.   ARTIFICIAL TEARS OP Place 1 drop into both eyes daily.   atorvastatin 40 MG tablet Commonly known as: LIPITOR Take 20 mg by mouth every morning.   celecoxib 100 MG capsule Commonly known as:  CELEBREX Take 100 mg by mouth 2 (two) times daily.   loperamide 2 MG capsule Commonly known as: IMODIUM Take 2 mg by mouth as needed for diarrhea or loose stools.   loratadine 10 MG tablet Commonly known as: CLARITIN Take 10 mg by mouth daily.   Ofev 100 MG Caps Generic drug: Nintedanib TAKE 1 CAPSULE BY MOUTH TWICE A DAY What changed: how much to take   traMADol 50 MG tablet Commonly known as: ULTRAM Take 1 tablet (50 mg total) by mouth every 6 (six) hours as needed for moderate pain or severe pain.      Follow-up Information    Alroy Dust, L.Marlou Sa, MD Follow up.   Specialty: Family Medicine Contact information: [address] [place] [address] [phone]        Tyler Pita, MD. Go on 11/28/2019.   Specialty: Radiation Oncology Contact information: [place] [id number] [phone]        Curt Bears, MD. Schedule an appointment as soon as possible for a visit in 1 week(s).   Specialty: Oncology Contact information: [place] [address] [phone]        Melvenia Needles, NP. Go on 12/15/2019.   Specialty: Pulmonary Disease Contact information: [place]  [place] [address] [facility].   Specialty: Radiology Why: They will call you to make appointment to schedule for lymph node biopsy as an outpatient  Contact information: [address] [id number] [place] [address] [phone]         Allergies  Allergen Reactions  . Penicillins Hives, Swelling and Rash    Has patient had a PCN reaction causing immediate rash, facial/tongue/throat swelling, SOB or lightheadedness with hypotension: Yes Has patient had a PCN reaction causing severe rash involving mucus membranes or skin necrosis: No Has patient had a PCN reaction that required hospitalization:  No Has patient had a PCN reaction occurring within the last 10 years: No If all of the above answers are "NO", then may proceed with Cephalosporin use.     Consultations:  Pulmonology  Radiation oncology  Oncology    Procedures/Studies: DG Chest 1 View  Result Date: 11/17/2019 CLINICAL DATA:  Status post thoracentesis on the left EXAM: CHEST  1 VIEW COMPARISON:  November 17, 2019 radiograph obtained earlier in the day; PET-CT November 16, 2019 FINDINGS: There is a stable small left apical pneumothorax without tension component. Left pleural effusion is smaller following thoracentesis. There is underlying airspace consolidation in the left upper and mid lung regions. There is underlying fibrosis. No new opacity is evident. Heart is upper normal in size with pulmonary vascularity within normal limits. There is aortic atherosclerosis. No appreciable adenopathy evident by radiography. Known bony metastases are not well seen by radiography. IMPRESSION: Stable left apical pneumothorax without appreciable tension component. Left pleural effusion smaller following thoracentesis. Airspace opacity in the left upper to mid lung region remains. Underlying fibrosis. No new opacity. Stable cardiac silhouette. Aortic Atherosclerosis (ICD10-I70.0). Electronically Signed   By: Lowella Grip III M.D.   On: 11/17/2019 09:22   DG Chest 2 View  Result Date: 11/20/2019 CLINICAL DATA:  Lung carcinoma with hypoxia EXAM: CHEST - 2 VIEW COMPARISON:  November 18, 2019 FINDINGS: There is loculated effusion on the left tracking into the left apex. The pneumothorax seen in this area 1 day prior is not evident on this study. There is fibrosis throughout both lungs. There is consolidation in the left mid lung region, stable, with potential underlying mass. No new opacity is evident in the lung parenchyma. Heart is upper normal in size with pulmonary vascularity normal. No adenopathy is evident by radiography. There is  aortic atherosclerosis. Several rib fractures are noted on the left, stable. IMPRESSION: Loculated pleural effusion in the left lateral chest and apex region. The pneumothorax in this area seen 1 day prior is not appreciable, although fluid could be present superimposed on pneumothorax in this area. Consolidation with questionable underlying mass in the left mid lung region is stable. There is underlying fibrosis. No new parenchymal lung opacity. Stable cardiac silhouette.  Aortic Atherosclerosis (ICD10-I70.0). Electronically Signed   By: Lowella Grip III M.D.   On: 11/20/2019 07:36   MR BRAIN W WO CONTRAST  Result Date: 11/21/2019 CLINICAL DATA:  Lung cancer staging. EXAM: MRI HEAD WITHOUT AND WITH CONTRAST TECHNIQUE: Multiplanar, multiecho pulse sequences of the brain and surrounding structures were obtained without and with intravenous contrast. CONTRAST:  34mL GADAVIST GADOBUTROL 1 MMOL/ML IV SOLN COMPARISON:  MRI head 02/14/2013 FINDINGS: Brain: Mild atrophy. Scattered small white matter hyperintensities similar to the prior study. Negative for acute infarct. Negative for hemorrhage or mass. No enhancing mass lesion in the  brain. Vascular: Normal arterial flow voids. Atherosclerotic calcification and ectasia of the terminal left internal carotid artery is unchanged. Skull and upper cervical spine: Stable lesion anterior frontal bone on the left with fatty signal on T1. Probable hemangioma. No evidence of skeletal metastatic disease. Sinuses/Orbits: Paranasal sinuses show mild mucosal edema. Bilateral cataract surgery. Other: None IMPRESSION: Negative for metastatic disease to the brain Mild chronic microvascular ischemic change in the white matter. Electronically Signed   By: Franchot Gallo M.D.   On: 11/21/2019 14:44   MR THORACIC SPINE W WO CONTRAST  Result Date: 11/17/2019 CLINICAL DATA:  Lung cancer. Abnormal uptake into the thoracic spine on the PET scan. EXAM: MRI THORACIC WITHOUT AND WITH  CONTRAST TECHNIQUE: Multiplanar and multiecho pulse sequences of the thoracic spine were obtained without and with intravenous contrast. CONTRAST:  5.64mL GADAVIST GADOBUTROL 1 MMOL/ML IV SOLN COMPARISON:  PET scan 11/16/2019. CT of the chest 12/06/2017 and 12/07/2018 FINDINGS: MRI THORACIC SPINE FINDINGS Alignment: Slight degenerative retrolisthesis is present at T11-12 and T12-L1. No other significant listhesis is present. Levoconvex curvature is centered at T7-8. Vertebrae: Abnormal signal and enhancement is present at T3, T4, T5, and T6, consistent with metastatic disease to the spine. Enhancement occupies the posterior 2/3 of the vertebral body at T3 without significant extension in the pedicles. There is diffuse enhancement in the posterior 1/3 of the vertebral body at T4 without abnormal signal and enhancement extending into the left pedicle. More heterogeneous posterior enhancement is present at T5 and T6 with abnormal signal and enhancement extending into the left pedicle of both levels. No pathologic fractures are present. No definite extraosseous tumor or enhancement is present. No other focal areas of enhancement are present within the thoracic or visualized upper lumbar spine. The scout image demonstrates signal change at C6 and possibly C5, is consistent with additional metastases. There is no definite enhancement in this region. This likely represents more typical degenerative change. Cord:  Normal signal is present in the thoracic spinal cord. Paraspinal and other soft tissues: Bilateral pleural effusions are present. Post obstructive disease is present in the left lung. Bilateral airspace opacities are noted. Complex cysts are again noted in the left kidney. Upper abdomen is otherwise unremarkable. Disc levels: T1-2: A broad-based disc protrusion is asymmetric to the right. Moderate right and mild left foraminal narrowing is present. T2-3: Broad-based disc bulging and facet disease contributes to  mild foraminal narrowing bilaterally, right greater than left. T3-4: Facet spurring contributes to mild right foraminal narrowing. T4-5: No significant disc disease. Facet disease is worse on the right. T5-6: No significant disc disease. Facet hypertrophy is worse on the left. T6-7: A prominent right paramedian disc extrusion is present. Disc material extends 9 mm above the inferior endplate of T6. There is contact and slight distortion of the ventral surface the cord without abnormal signal. T7-8: A shallow central disc protrusion is present. No significant stenosis is present. T8-9: A left paramedian disc protrusion is present. There is effacement of ventral CSF without significant stenosis. T9-10: A far right lateral disc protrusion extends into the foramen with moderate right and mild left foraminal narrowing. T10-11: Facet spurring contributes to mild right foraminal narrowing. T11-12: A broad-based disc protrusion is present without significant stenosis. T12-L1: A broad-based disc protrusion is present. Mild foraminal narrowing is noted bilaterally. IMPRESSION: 1. Abnormal signal and enhancement at T3, T4, T5, and T6 consistent with metastatic disease to the spine. 2. No pathologic fractures are present. 3. Multilevel spondylosis of the lumbar  spine as described. 4. Bilateral lung disease and effusions, left greater than right. Please see PET scan from yesterday for further detail. 5. Complex left renal cysts are stable. Electronically Signed   By: San Morelle M.D.   On: 11/17/2019 05:43   NM PET Image Restag (PS) Skull Base To Thigh  Result Date: 11/16/2019 CLINICAL DATA:  Subsequent treatment strategy for lung cancer. EXAM: NUCLEAR MEDICINE PET SKULL BASE TO THIGH TECHNIQUE: 5.7 mCi F-18 FDG was injected intravenously. Full-ring PET imaging was performed from the skull base to thigh after the radiotracer. CT data was obtained and used for attenuation correction and anatomic localization.  Fasting blood glucose: 128 mg/dl COMPARISON:  05/24/2017 FINDINGS: Mediastinal blood pool activity: SUV max 2.4 Liver activity: SUV max NA NECK: No hypermetabolic lymph nodes in the neck. Incidental CT findings: none CHEST: Multiple areas of hypermetabolism are identified in the anterior left lung including a 3.3 x 2.1 cm consolidative opacity in the medial left upper lung adjacent to the mediastinum (see image 53/4) with SUV max = 5.6. Consolidative opacity seen relatively diffusely in the left parahilar lung shows hypermetabolic FDG uptake with SUV max = 5.6. Focal uptake in the inferior left hilum demonstrates SUV max = 5.1. Focal uptake in the region of the right atrium noted without underlying lesion evident on this noncontrast CT. There is some soft tissue attenuation anterior to the ascending aorta that stable since prior study. Bandlike opacity anterior right upper lobe (image 52/series 4) shows low level hypermetabolism with SUV max = 3.9. Focal hypermetabolism noted in the right hilum without lymphadenopathy evident by CT. Similar focal hypermetabolism identified inferior right hilum. There is a moderate left pleural fluid collection with gas identified in the left anterior pleural space. Low level diffuse FDG accumulation is identified along the left pleural surface. 9 mm short axis left supraclavicular node (34/4) demonstrates SUV max = 3.9. Focus of hypermetabolism identified in the left T11 paraspinal tissues without underlying mass lesion evident by CT. SUV max = 4.4. Incidental CT findings: There is abdominal aortic atherosclerosis without aneurysm. Acute to subacute displaced fractures noted left lateral fifth rib and posterolateral left sixth rib. Extensive chronic interstitial/fibrotic lung disease noted bilaterally. ABDOMEN/PELVIS: No abnormal hypermetabolic activity within the liver, pancreas, adrenal glands, or spleen. No hypermetabolic lymph nodes in the abdomen or pelvis. Incidental CT  findings: There is abdominal aortic atherosclerosis without aneurysm. Layering tiny gallstones evident. Multiple left renal cysts of varying size and attenuation some with layering calcific debris or hemorrhage. SKELETON: Hypermetabolism is identified in the posterior aspect of the T3 and T4 vertebral bodies although no underlying bony lesion is evident. SUV max = 8.4 at the level of T3. Incidental CT findings: Diffuse degenerative changes noted in the spine. Acute to subacute left rib fractures as above. Patient is status post right hip replacement. IMPRESSION: 1. Multiple areas of low level hypermetabolism associated with consolidative opacity in the left upper and lower lung with hypermetabolic left supraclavicular lymph node, focal hypermetabolism in the hilar regions and anterior mediastinum. Imaging features could be related to diffuse neoplasm (adenocarcinoma) with metastatic disease. Infectious/inflammatory etiology not excluded. 2. Moderate to large left hydropneumothorax, likely posttraumatic as there are acute to subacute displaced fractures of the left fourth and fifth ribs. 3. Bandlike opacity in the anterior right lung with low level hypermetabolism, indeterminate. 4. Hypermetabolic focus in the left 11 paraspinal tissues without underlying mass lesion evident by CT. 5. Hypermetabolic activity in the posterior T3 and T4 vertebral bodies  without associated mass lesion evident by CT. Thoracic spine MRI with and without contrast could be used to further evaluate as clinically warranted. 6. Advanced chronic interstitial lung disease with fibrotic component. 7.  Aortic Atherosclerois (ICD10-170.0) These results will be called to the ordering clinician or representative by the Radiologist Assistant, and communication documented in the PACS or zVision Dashboard. Electronically Signed   By: Misty Stanley M.D.   On: 11/16/2019 15:09   DG CHEST PORT 1 VIEW  Result Date: 11/18/2019 CLINICAL DATA:   Hydropneumothorax. EXAM: PORTABLE CHEST 1 VIEW COMPARISON:  Chest x-ray from yesterday. FINDINGS: Stable cardiomediastinal silhouette. Unchanged small left apical pneumothorax. Unchanged pleural thickening/fluid along the left lateral chest wall adjacent to displaced fifth and sixth rib fractures. Advanced chronic interstitial lung disease again noted. Unchanged consolidation in the left mid lung. No acute osseous abnormality. IMPRESSION: 1. Unchanged small left hydropneumothorax and consolidation in the left mid lung. Electronically Signed   By: Titus Dubin M.D.   On: 11/18/2019 09:37   DG CHEST PORT 1 VIEW  Result Date: 11/17/2019 CLINICAL DATA:  History of broken ribs and pneumothorax. Pleural effusion. EXAM: PORTABLE CHEST 1 VIEW COMPARISON:  PET CT 11/16/2019.  Chest x-ray 09/22/2017. FINDINGS: Mediastinum stable. Stable cardiomegaly. Dense left mid lung infiltrate/mass noted. Chronic interstitial prominence noted. Small left pleural effusion noted. Tiny left apical pneumothorax cannot be excluded. This appears decreased in size from prior PET-CT of 11/16/2019. Left posterior fifth and sixth rib fractures again noted. IMPRESSION: 1. Dense left mid lung field infiltrate/mass noted. Reference is made to recent PET-CT report of 11/16/2019. Chronic interstitial prominence noted. Small left pleural effusion noted. 2. Tiny residual left apical pneumothorax cannot be excluded. This appears decreased in size from prior PET-CT of 11/16/2019. Left posterior fifth and sixth rib fractures again noted. Electronically Signed   By: Marcello Moores  Register   On: 11/17/2019 07:43   ECHOCARDIOGRAM COMPLETE  Result Date: 11/17/2019   ECHOCARDIOGRAM REPORT   Patient Name:   Farha Tabora Date of Exam: 11/17/2019 Medical Rec #:  [id number]       Height:       62.0 in Accession #:    [id number]      Weight:       135.0 lb Date of Birth:  1941-01-09       BSA:          1.62 m Patient Age:    68 years        BP:            109/66 mmHg Patient Gender: F               HR:           73 bpm. Exam Location:  Inpatient Procedure: Limited Echo and Limited Color Doppler Indications:    CHF-Acute Diastolic 253.66 / Y40.34  History:        Patient has prior history of Echocardiogram examinations, most                 recent 11/16/2019. Risk Factors:Hypertension and Dyslipidemia.                 Lung cancer, pulmonary fibrosis.  Sonographer:    Darlina Sicilian RDCS Referring Phys: [place]  1. Left ventricular ejection fraction, by visual estimation, is 60 to 65%. The left ventricle has normal function. Mildly increased left ventricular posterior wall thickness. There is severely increased left ventricular hypertrophy of the basal septum.  2.  Global right ventricle has normal systolic function.The right ventricular size is normal. No increase in right ventricular wall thickness.  3. Left atrial size was normal.  4. Right atrial size was normal.  5. The mitral valve is normal in structure. No evidence of mitral valve regurgitation. No evidence of mitral stenosis.  6. The tricuspid valve is normal in structure. Tricuspid valve regurgitation is trivial.  7. The aortic valve is normal in structure. Aortic valve regurgitation is not visualized. No evidence of aortic valve sclerosis or stenosis.  8. The pulmonic valve was normal in structure. Pulmonic valve regurgitation is not visualized.  9. Normal pulmonary artery systolic pressure. 10. The inferior vena cava is normal in size with greater than 50% respiratory variability, suggesting right atrial pressure of 3 mmHg. 11. The left ventricle has no regional wall motion abnormalities. 12. Left ventricular diastolic function could not be evaluated. FINDINGS  Left Ventricle: Left ventricular ejection fraction, by visual estimation, is 60 to 65%. The left ventricle has normal function. The left ventricle has no regional wall motion abnormalities. Mildly increased left ventricular  posterior wall thickness. There is severely increased left ventricular hypertrophy. Left ventricular diastolic function could not be evaluated. Indeterminate filling pressures. Right Ventricle: The right ventricular size is normal. No increase in right ventricular wall thickness. Global RV systolic function is has normal systolic function. The tricuspid regurgitant velocity is 2.50 m/s, and with an assumed right atrial pressure  of 3 mmHg, the estimated right ventricular systolic pressure is normal at 28.0 mmHg. Left Atrium: Left atrial size was normal in size. Right Atrium: Right atrial size was normal in size Pericardium: There is no evidence of pericardial effusion. Mitral Valve: The mitral valve is normal in structure. No evidence of mitral valve regurgitation. No evidence of mitral valve stenosis by observation. Tricuspid Valve: The tricuspid valve is normal in structure. Tricuspid valve regurgitation is trivial. Aortic Valve: The aortic valve is normal in structure. Aortic valve regurgitation is not visualized. The aortic valve is structurally normal, with no evidence of sclerosis or stenosis. Pulmonic Valve: The pulmonic valve was normal in structure. Pulmonic valve regurgitation is not visualized. Pulmonic regurgitation is not visualized. Aorta: The aortic root, ascending aorta and aortic arch are all structurally normal, with no evidence of dilitation or obstruction. Venous: The inferior vena cava is normal in size with greater than 50% respiratory variability, suggesting right atrial pressure of 3 mmHg. IAS/Shunts: No atrial level shunt detected by color flow Doppler. There is no evidence of a patent foramen ovale. No ventricular septal defect is seen or detected. There is no evidence of an atrial septal defect.  LEFT VENTRICLE PLAX 2D LVIDd:         2.90 cm LVIDs:         1.70 cm LV PW:         1.40 cm LV IVS:        1.70 cm LVOT diam:     1.70 cm LV SV:         24 ml LV SV Index:   14.45 LVOT Area:      2.27 cm  LEFT ATRIUM           Index LA diam:      2.60 cm 1.61 cm/m LA Vol (A2C): 34.3 ml 21.21 ml/m LA Vol (A4C): 42.4 ml 26.21 ml/m  TRICUSPID VALVE TR Peak grad:   25.0 mmHg TR Vmax:        250.00 cm/s  SHUNTS Systemic Diam:  1.70 cm  Fransico Him MD Electronically signed by Fransico Him MD Signature Date/Time: 11/17/2019/11:50:50 AM    Final    IR THORACENTESIS ASP PLEURAL SPACE W/IMG GUIDE  Result Date: 11/17/2019 INDICATION: History of lung cancer. Shortness of breath. Left hydropneumothorax seen on recent imaging. Request diagnostic and therapeutic thoracentesis. EXAM: ULTRASOUND GUIDED LEFT THORACENTESIS MEDICATIONS: None. COMPLICATIONS: None immediate. PROCEDURE: An ultrasound guided thoracentesis was thoroughly discussed with the patient and questions answered. The benefits, risks, alternatives and complications were also discussed. The patient understands and wishes to proceed with the procedure. Written consent was obtained. Ultrasound was performed to localize and mark an adequate pocket of fluid in the left chest. The area was then prepped and draped in the normal sterile fashion. 1% Lidocaine was used for local anesthesia. Under ultrasound guidance a 6 Fr Safe-T-Centesis catheter was introduced. Thoracentesis was performed. The catheter was removed and a dressing applied. FINDINGS: A total of approximately 470 mL of serosanguineous fluid was removed. Samples were sent to the laboratory as requested by the clinical team. IMPRESSION: Successful ultrasound guided left thoracentesis yielding 470 mL of pleural fluid. Read by: Ascencion Dike PA-C Electronically Signed   By: Aletta Edouard M.D.   On: 11/17/2019 09:28       Discharge Exam: Vitals:   11/20/19 2317 11/21/19 0750  BP: 135/89 (!) 152/80  Pulse: (!) 103 (!) 113  Resp:  17  Temp: 98.3 F (36.8 C) 97.9 F (36.6 C)  SpO2: 96% 91%     General: Pt is alert, awake, not in acute distress Cardiovascular: RRR, S1/S2 +, no  edema Respiratory: Crackles bilaterally, no respiratory distress, no conversational dyspnea  Abdominal: Soft, NT, ND, bowel sounds + Extremities: no edema, no cyanosis Psych: Normal mood and affect, stable judgement and insight     The results of significant diagnostics from this hospitalization (including imaging, microbiology, ancillary and laboratory) are listed below for reference.     Microbiology: Recent Results (from the past 240 hour(s))  SARS CORONAVIRUS 2 (TAT 6-24 HRS) Nasopharyngeal Nasopharyngeal Swab     Status: None   Collection Time: 11/16/19 10:08 PM   Specimen: Nasopharyngeal Swab  Result Value Ref Range Status   SARS Coronavirus 2 NEGATIVE NEGATIVE Final    Comment: (NOTE) SARS-CoV-2 target nucleic acids are NOT DETECTED. The SARS-CoV-2 RNA is generally detectable in upper and lower respiratory specimens during the acute phase of infection. Negative results do not preclude SARS-CoV-2 infection, do not rule out co-infections with other pathogens, and should not be used as the sole basis for treatment or other patient management decisions. Negative results must be combined with clinical observations, patient history, and epidemiological information. The expected result is Negative. Fact Sheet for Patients: SugarRoll.be Fact Sheet for Healthcare Providers: https://www.woods-mathews.com/ This test is not yet approved or cleared by the [place] FDA and  has been authorized for detection and/or diagnosis of SARS-CoV-2 by FDA under an Emergency Use Authorization (EUA). This EUA will remain  in effect (meaning this test can be used) for the duration of the COVID-19 declaration under Section 56 4(b)(1) of the Act, 21 U.S.C. section 360bbb-3(b)(1), unless the authorization is terminated or revoked sooner. Performed at [facility], [place] [address] [place], [place] [address]   Body fluid culture     Status: None    Collection Time: 11/17/19  9:36 AM   Specimen: Lung, Left; Pleural Fluid  Result Value Ref Range Status   Specimen Description PLEURAL LEFT  Final  Special Requests NONE  Final   Gram Stain   Final    RARE WBC PRESENT, PREDOMINANTLY MONONUCLEAR NO ORGANISMS SEEN    Culture   Final    NO GROWTH 3 DAYS Performed at [facility], [address] [place], [place] [address]    Report Status 11/20/2019 FINAL  Final  MRSA PCR Screening     Status: None   Collection Time: 11/17/19  6:02 PM   Specimen: Nasopharyngeal  Result Value Ref Range Status   MRSA by PCR NEGATIVE NEGATIVE Final    Comment:        The GeneXpert MRSA Assay (FDA approved for NASAL specimens only), is one component of a comprehensive MRSA colonization surveillance program. It is not intended to diagnose MRSA infection nor to guide or monitor treatment for MRSA infections. Performed at [facility], [place] [address] [place], [place] [address]      Labs: BNP (last 3 results) No results for input(s): BNP in the last 8760 hours. Basic Metabolic Panel: Recent Labs  Lab 11/17/19 0156 11/18/19 0314 11/19/19 0428 11/20/19 0251 11/21/19 0215  NA 124* 129* 128* 128* 129*  K 3.5 3.1* 3.5 3.8 3.8  CL 80* 84* 87* 87* 85*  CO2 32 36* 31 32 33*  GLUCOSE 86 115* 107* 100* 106*  BUN 10 7* 7* 5* 5*  CREATININE 0.51 0.56 0.48 0.49 0.53  CALCIUM 9.4 9.3 9.0 9.0 9.4   Liver Function Tests: Recent Labs  Lab 11/17/19 0156  AST 21  ALT 12  ALKPHOS 54  BILITOT 0.5  PROT 5.8*  ALBUMIN 2.9*   No results for input(s): LIPASE, AMYLASE in the last 168 hours. No results for input(s): AMMONIA in the last 168 hours. CBC: Recent Labs  Lab 11/16/19 1947 11/17/19 0156 11/18/19 0314 11/21/19 0215  WBC 11.9* 12.2* 11.9* 11.4*  HGB 12.5 12.8 12.4 12.1  HCT 35.3* 36.7 35.6* 35.5*  MCV 89.6 92.2 92.0 93.9  PLT 477* 437* 403* 400   Cardiac Enzymes: No results for input(s): CKTOTAL, CKMB, CKMBINDEX,  TROPONINI in the last 168 hours. BNP: Invalid input(s): POCBNP CBG: Recent Labs  Lab 11/16/19 1150  GLUCAP 128*   D-Dimer No results for input(s): DDIMER in the last 72 hours. Hgb A1c No results for input(s): HGBA1C in the last 72 hours. Lipid Profile No results for input(s): CHOL, HDL, LDLCALC, TRIG, CHOLHDL, LDLDIRECT in the last 72 hours. Thyroid function studies No results for input(s): TSH, T4TOTAL, T3FREE, THYROIDAB in the last 72 hours.  Invalid input(s): FREET3 Anemia work up No results for input(s): VITAMINB12, FOLATE, FERRITIN, TIBC, IRON, RETICCTPCT in the last 72 hours. Urinalysis    Component Value Date/Time   COLORURINE YELLOW 11/09/2012 1034   APPEARANCEUR CLOUDY (A) 11/09/2012 1034   LABSPEC 1.020 11/09/2012 1034   PHURINE 6.0 11/09/2012 1034   GLUCOSEU NEGATIVE 11/09/2012 1034   HGBUR TRACE (A) 11/09/2012 [place] 11/09/2012 1034   KETONESUR NEGATIVE 11/09/2012 1034   PROTEINUR 30 (A) 11/09/2012 1034   UROBILINOGEN 1.0 11/09/2012 1034   NITRITE NEGATIVE 11/09/2012 1034   LEUKOCYTESUR SMALL (A) 11/09/2012 1034   Sepsis Labs Invalid input(s): PROCALCITONIN,  WBC,  LACTICIDVEN Microbiology Recent Results (from the past 240 hour(s))  SARS CORONAVIRUS 2 (TAT 6-24 HRS) Nasopharyngeal Nasopharyngeal Swab     Status: None   Collection Time: 11/16/19 10:08 PM   Specimen: Nasopharyngeal Swab  Result Value Ref Range Status   SARS Coronavirus 2 NEGATIVE NEGATIVE Final  Comment: (NOTE) SARS-CoV-2 target nucleic acids are NOT DETECTED. The SARS-CoV-2 RNA is generally detectable in upper and lower respiratory specimens during the acute phase of infection. Negative results do not preclude SARS-CoV-2 infection, do not rule out co-infections with other pathogens, and should not be used as the sole basis for treatment or other patient management decisions. Negative results must be combined with clinical observations, patient history, and  epidemiological information. The expected result is Negative. Fact Sheet for Patients: SugarRoll.be Fact Sheet for Healthcare Providers: https://www.woods-mathews.com/ This test is not yet approved or cleared by the [place] FDA and  has been authorized for detection and/or diagnosis of SARS-CoV-2 by FDA under an Emergency Use Authorization (EUA). This EUA will remain  in effect (meaning this test can be used) for the duration of the COVID-19 declaration under Section 56 4(b)(1) of the Act, 21 U.S.C. section 360bbb-3(b)(1), unless the authorization is terminated or revoked sooner. Performed at [facility], [place] [address], [place], [place] [address]   Body fluid culture     Status: None   Collection Time: 11/17/19  9:36 AM   Specimen: Lung, Left; Pleural Fluid  Result Value Ref Range Status   Specimen Description PLEURAL LEFT  Final   Special Requests NONE  Final   Gram Stain   Final    RARE WBC PRESENT, PREDOMINANTLY MONONUCLEAR NO ORGANISMS SEEN    Culture   Final    NO GROWTH 3 DAYS Performed at [facility], [place] [address], [place], [place] [address]    Report Status 11/20/2019 FINAL  Final  MRSA PCR Screening     Status: None   Collection Time: 11/17/19  6:02 PM   Specimen: Nasopharyngeal  Result Value Ref Range Status   MRSA by PCR NEGATIVE NEGATIVE Final    Comment:        The GeneXpert MRSA Assay (FDA approved for NASAL specimens only), is one component of a comprehensive MRSA colonization surveillance program. It is not intended to diagnose MRSA infection nor to guide or monitor treatment for MRSA infections. Performed at [facility], [place] [address] [place],  [address]      Patient was seen and examined on the day of discharge and was found to be in stable condition. Time coordinating discharge: 35 minutes including assessment and coordination of care, as well as examination of the  patient.   SIGNED:  Dessa Phi, DO [facility] 11/21/2019, 3:27 PM

## 2019-11-21 NOTE — Progress Notes (Signed)
PROGRESS NOTE    [name] [name]  UXL:[id number] DOB: 03-Sep-1941 DOA: 11/16/2019 PCP: Alroy Dust, L.Marlou Sa, MD     Brief Narrative:  [name] [name] is a 78 y.o. 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 Elsworth Soho who presents secondary to PET scan finding of hydropneumothorax.  PET scan obtained as an outpatient revealed multiple areas of hypermetabolism associated with consolidative opacity in the left lung with hypermetabolic left supraclavicular lymph node.  Also revealed moderate to large left hydropneumothorax.  Patient underwent thoracentesis.  New events last 24 hours / Subjective: No new issues today.  Awaiting cytology results, wants to go home.  Assessment & Plan:   Principal Problem:   Hydropneumothorax Active Problems:   Pulmonary fibrosis (HCC)   Hypertension   Idiopathic pulmonary fibrosis (HCC)   Dyspnea   Metastatic cancer to bone (HCC)   Acute on chronic hypoxemic respiratory failure secondary to hydropneumothorax -Chronically on 2L [place] O2 at baseline   -Covid screening negative -Status post thoracentesis 12/18. Stable left apical pneumothorax s/p thoracentesis -Await pleural cytology.  By Light's criteria, fluid is exudative in nature -Repeat CXR 12/19 shows unchanged small left hydropneumothorax and consolidation in the left midlung -Repeat CXR 12/21 shows loculated pleural effusion in the left lateral chest and apex region. The pneumothorax in this area seen 1 day prior is not appreciable, although fluid could be present superimposed on pneumothorax in this area. -PCCM following   Concern for metastatic disease with history of lung cancer in the past -PET: multiple areas of low level hypermetabolism  associated with consolidative opacity in the left upper and lower lung with hypermetabolic left supraclavicular lymph node, focal hypermetabolism in the hilar regions and anterior mediastinum. -MRI thoracic spine: abnormal signal and enhancement at T3, T4, T5, and T6 consistent with metastatic disease to the spine -Radiation oncology consulted; they have set up an outpatient appointment for 12/29 as well as arranging for follow-up with Dr. Julien Nordmann as an outpatient as well -Appreciate oncology consultation -Obtain MRI brain to staging  -Awaiting cytology result from thoracentesis.  If negative, patient will need to undergo lymph node biopsy for definitive diagnosis  Hyponatremia -Hold HCTZ -Stable, monitor BMP  Acute/subacute rib fracture -IS   ILD/IPF -Ofev   Essential hypertension -HCTZ held due to hyponatremia -Amlodipine  Hyperlipidemia -Lipitor    DVT prophylaxis: Lovenox  Code Status: Full Family Communication: None at bedside Disposition Plan: Pending results from pleural study   Consultants:   Pulmonology  Radiation oncology  Oncology   Procedures:   Left thoracentesis by IR 12/18  Antimicrobials:  Anti-infectives (From admission, onward)   None       Objective: Vitals:   11/20/19 1538 11/20/19 2317 11/21/19 0329 11/21/19 0750  BP: (!) 175/79 135/89  (!) 152/80  Pulse: (!) 101 (!) 103  (!) 113  Resp: 20 19  17   Temp: 98.1 F (36.7 C) 98.3 F (36.8 C)  97.9 F (36.6 C)  TempSrc: Oral Oral  Oral  SpO2: 94% 96%  91%  Weight:   59.1 kg   Height:       No intake or output data in the 24 hours ending 11/21/19 1008 Filed Weights   11/19/19 0543 11/20/19 0500 11/21/19 0329  Weight: 58.6 kg 59.5 kg 59.1 kg  Examination: General exam: Appears calm and comfortable  Respiratory system: Lateral crackles.  No conversational dyspnea or distress.  Respiratory effort normal.  On nasal cannula O2 Cardiovascular system: S1 & S2 heard, tachycardic,  regular rhythm. No pedal edema. Gastrointestinal system: Abdomen is nondistended, soft and nontender. Normal bowel sounds heard. Central nervous system: Alert and oriented. Non focal exam. Speech clear  Extremities: Symmetric in appearance bilaterally  Skin: No rashes, lesions or ulcers on exposed skin  Psychiatry: Judgement and insight appear stable. Mood & affect appropriate.   Data Reviewed: I have personally reviewed following labs and imaging studies  CBC: Recent Labs  Lab 11/16/19 1947 11/17/19 0156 11/18/19 0314 11/21/19 0215  WBC 11.9* 12.2* 11.9* 11.4*  HGB 12.5 12.8 12.4 12.1  HCT 35.3* 36.7 35.6* 35.5*  MCV 89.6 92.2 92.0 93.9  PLT 477* 437* 403* 774   Basic Metabolic Panel: Recent Labs  Lab 11/17/19 0156 11/18/19 0314 11/19/19 0428 11/20/19 0251 11/21/19 0215  NA 124* 129* 128* 128* 129*  K 3.5 3.1* 3.5 3.8 3.8  CL 80* 84* 87* 87* 85*  CO2 32 36* 31 32 33*  GLUCOSE 86 115* 107* 100* 106*  BUN 10 7* 7* 5* 5*  CREATININE 0.51 0.56 0.48 0.49 0.53  CALCIUM 9.4 9.3 9.0 9.0 9.4   GFR: Estimated Creatinine Clearance: 47.3 mL/min (by C-G formula based on SCr of 0.53 mg/dL). Liver Function Tests: Recent Labs  Lab 11/17/19 0156  AST 21  ALT 12  ALKPHOS 54  BILITOT 0.5  PROT 5.8*  ALBUMIN 2.9*   No results for input(s): LIPASE, AMYLASE in the last 168 hours. No results for input(s): AMMONIA in the last 168 hours. Coagulation Profile: Recent Labs  Lab 11/17/19 0156  INR 1.0   Cardiac Enzymes: No results for input(s): CKTOTAL, CKMB, CKMBINDEX, TROPONINI in the last 168 hours. BNP (last 3 results) No results for input(s): PROBNP in the last 8760 hours. HbA1C: No results for input(s): HGBA1C in the last 72 hours. CBG: Recent Labs  Lab 11/16/19 1150  GLUCAP 128*   Lipid Profile: No results for input(s): CHOL, HDL, LDLCALC, TRIG, CHOLHDL, LDLDIRECT in the last 72 hours. Thyroid Function Tests: No results for input(s): TSH, T4TOTAL, FREET4, T3FREE,  THYROIDAB in the last 72 hours. Anemia Panel: No results for input(s): VITAMINB12, FOLATE, FERRITIN, TIBC, IRON, RETICCTPCT in the last 72 hours. Sepsis Labs: No results for input(s): PROCALCITON, LATICACIDVEN in the last 168 hours.  Recent Results (from the past 240 hour(s))  SARS CORONAVIRUS 2 (TAT 6-24 HRS) Nasopharyngeal Nasopharyngeal Swab     Status: None   Collection Time: 11/16/19 10:08 PM   Specimen: Nasopharyngeal Swab  Result Value Ref Range Status   SARS Coronavirus 2 NEGATIVE NEGATIVE Final    Comment: (NOTE) SARS-CoV-2 target nucleic acids are NOT DETECTED. The SARS-CoV-2 RNA is generally detectable in upper and lower respiratory specimens during the acute phase of infection. Negative results do not preclude SARS-CoV-2 infection, do not rule out co-infections with other pathogens, and should not be used as the sole basis for treatment or other patient management decisions. Negative results must be combined with clinical observations, patient history, and epidemiological information. The expected result is Negative. Fact Sheet for Patients: SugarRoll.be Fact Sheet for Healthcare Providers: https://www.woods-mathews.com/ This test is not yet approved or cleared by the [place] FDA and  has been authorized for detection and/or diagnosis of SARS-CoV-2 by FDA under an Emergency Use Authorization (EUA). This EUA will remain  in effect (meaning this  test can be used) for the duration of the COVID-19 declaration under Section 56 4(b)(1) of the Act, 21 U.S.C. section 360bbb-3(b)(1), unless the authorization is terminated or revoked sooner. Performed at [facility], [place] [address] [place], [place] [address]   Body fluid culture     Status: None   Collection Time: 11/17/19  9:36 AM   Specimen: Lung, Left; Pleural Fluid  Result Value Ref Range Status   Specimen Description PLEURAL LEFT  Final   Special Requests NONE   Final   Gram Stain   Final    RARE WBC PRESENT, PREDOMINANTLY MONONUCLEAR NO ORGANISMS SEEN    Culture   Final    NO GROWTH 3 DAYS Performed at [facility], [place] [address] [place], [place] [address]    Report Status 11/20/2019 FINAL  Final  MRSA PCR Screening     Status: None   Collection Time: 11/17/19  6:02 PM   Specimen: Nasopharyngeal  Result Value Ref Range Status   MRSA by PCR NEGATIVE NEGATIVE Final    Comment:        The GeneXpert MRSA Assay (FDA approved for NASAL specimens only), is one component of a comprehensive MRSA colonization surveillance program. It is not intended to diagnose MRSA infection nor to guide or monitor treatment for MRSA infections. Performed at [facility], [place] [address] [place], [place] [address]       Radiology Studies: DG Chest 2 View  Result Date: 11/20/2019 CLINICAL DATA:  Lung carcinoma with hypoxia EXAM: CHEST - 2 VIEW COMPARISON:  November 18, 2019 FINDINGS: There is loculated effusion on the left tracking into the left apex. The pneumothorax seen in this area 1 day prior is not evident on this study. There is fibrosis throughout both lungs. There is consolidation in the left mid lung region, stable, with potential underlying mass. No new opacity is evident in the lung parenchyma. Heart is upper normal in size with pulmonary vascularity normal. No adenopathy is evident by radiography. There is aortic atherosclerosis. Several rib fractures are noted on the left, stable. IMPRESSION: Loculated pleural effusion in the left lateral chest and apex region. The pneumothorax in this area seen 1 day prior is not appreciable, although fluid could be present superimposed on pneumothorax in this area. Consolidation with questionable underlying mass in the left mid lung region is stable. There is underlying fibrosis. No new parenchymal lung opacity. Stable cardiac silhouette.  Aortic Atherosclerosis (ICD10-I70.0). Electronically Signed    By: Lowella Grip III M.D.   On: 11/20/2019 07:36      Scheduled Meds: . amLODipine  10 mg Oral Daily  . atorvastatin  20 mg Oral q morning - 10a  . enoxaparin (LOVENOX) injection  40 mg Subcutaneous Q24H  . lidocaine  1 patch Transdermal Q24H  . loratadine  10 mg Oral Daily  . Nintedanib  100 mg Oral Q12H  . polyvinyl alcohol  1 drop Both Eyes Daily   Continuous Infusions:   LOS: 5 days      Time spent: 25 minutes   Dessa Phi, DO [facility] 11/21/2019, 10:08 AM   Available via Epic secure chat 7am-7pm After these hours, please refer to coverage provider listed on [email or web address]

## 2019-11-21 NOTE — Progress Notes (Signed)
Nsg Discharge Note  Patient to be discharged home. Son present for discharge teaching and transportation. Medication delivered to room from pharmacy.  Admit Date:  11/16/2019 Discharge date: 11/21/2019   [name] [name] to be D/C'd Home per MD order.  AVS completed.  Copy for chart, and copy for patient signed, and dated. Patient/caregiver able to verbalize understanding.  Discharge Medication: Allergies as of 11/21/2019      Reactions   Penicillins Hives, Swelling, Rash   Has patient had a PCN reaction causing immediate rash, facial/tongue/throat swelling, SOB or lightheadedness with hypotension: Yes Has patient had a PCN reaction causing severe rash involving mucus membranes or skin necrosis: No Has patient had a PCN reaction that required hospitalization: No Has patient had a PCN reaction occurring within the last 10 years: No If all of the above answers are "NO", then may proceed with Cephalosporin use.      Medication List    STOP taking these medications   hydrochlorothiazide 25 MG tablet Commonly known as: HYDRODIURIL     TAKE these medications   acetaminophen 500 MG tablet Commonly known as: TYLENOL Take 500-1,000 mg by mouth every 6 (six) hours as needed for moderate pain.   amLODipine 10 MG tablet Commonly known as: NORVASC Take 1 tablet (10 mg total) by mouth daily.   ARTIFICIAL TEARS OP Place 1 drop into both eyes daily.   atorvastatin 40 MG tablet Commonly known as: LIPITOR Take 20 mg by mouth every morning.   celecoxib 100 MG capsule Commonly known as: CELEBREX Take 100 mg by mouth 2 (two) times daily.   loperamide 2 MG capsule Commonly known as: IMODIUM Take 2 mg by mouth as needed for diarrhea or loose stools.   loratadine 10 MG tablet Commonly known as: CLARITIN Take 10 mg by mouth daily.   Ofev 100 MG Caps Generic drug: Nintedanib TAKE 1 CAPSULE BY MOUTH TWICE A DAY What changed: how much to take   traMADol 50 MG tablet Commonly known  as: ULTRAM Take 1 tablet (50 mg total) by mouth every 6 (six) hours as needed for moderate pain or severe pain.       Discharge Assessment: Vitals:   11/20/19 2317 11/21/19 0750  BP: 135/89 (!) 152/80  Pulse: (!) 103 (!) 113  Resp:  17  Temp: 98.3 F (36.8 C) 97.9 F (36.6 C)  SpO2: 96% 91%   Skin clean, dry and intact without evidence of skin break down, no evidence of skin tears noted. IV catheter discontinued intact. Site without signs and symptoms of complications - no redness or edema noted at insertion site, patient denies c/o pain - only slight tenderness at site.  Dressing with slight pressure applied.  D/c Instructions-Education: Discharge instructions given to patient/family with verbalized understanding. D/c education completed with patient/family including follow up instructions, medication list, d/c activities limitations if indicated, with other d/c instructions as indicated by MD - patient able to verbalize understanding, all questions fully answered. Patient instructed to return to ED, call 911, or call MD for any changes in condition.  Patient escorted via [place], and D/C home via private auto.  Erasmo Leventhal, RN 11/21/2019 3:49 PM

## 2019-11-22 ENCOUNTER — Telehealth: Payer: Self-pay | Admitting: *Deleted

## 2019-11-22 ENCOUNTER — Other Ambulatory Visit: Payer: Self-pay | Admitting: Radiology

## 2019-11-22 ENCOUNTER — Telehealth: Payer: Self-pay | Admitting: Pulmonary Disease

## 2019-11-22 DIAGNOSIS — R591 Generalized enlarged lymph nodes: Secondary | ICD-10-CM

## 2019-11-22 NOTE — Telephone Encounter (Signed)
Updated Sam - to prognosis QOL paramount here. Pt & son are receptive to idea of hospice should she deteriorate. They will contact me if she is not doing well For now, proceed with plan of LN biopsy & markers for EGFR status

## 2019-11-22 NOTE — Telephone Encounter (Signed)
Oncology Nurse Navigator Documentation  Oncology Nurse Navigator Flowsheets 11/22/2019  Confirmed Diagnosis Date 11/17/2019  Diagnosis Status Pending Molecular Studies  Navigator Follow Up Date: 12/21/2019  Navigator Follow Up Reason: New Patient Appointment  Navigator Location [place] Encounter Type Telephone  Telephone Outgoing Call  Treatment Phase Pre-Tx/Tx Discussion  Barriers/Navigation Needs Coordination of Care;Education  Education Other/I called and spoke with Ms. Macchi today.  I scheduled her to see Dr. Julien Nordmann on Monday 12/28.  Patient verbalized understanding of appt time and place.  Per Dr. Julien Nordmann I contacted pathology to have them send Foundation one and PDL 1 on Ms. [place]ena tissue [phone].    Interventions Coordination of Care;Education  Acuity Level 2-Minimal Needs (1-2 Barriers Identified)  Coordination of Care Appts;Other  Education Method Verbal  Time Spent with Patient 30

## 2019-11-22 NOTE — Telephone Encounter (Signed)
I called pt's son and there was no answer. I left a message advising him to call back.

## 2019-11-22 NOTE — Telephone Encounter (Signed)
Spoke with [name] [name], he states his mom has told him that they should go ahead with setting up a referral for hospice. She spoke with the doctor at the hospital (Dr. Elsworth Soho) but nobody was in the room with her. He states his mom gets confused and just wants to know the prognosis of her condition. He knows that it may not be good but I was unable to tell him this information. Dr Elsworth Soho can you please call pt's son Inocente Salles. Thanks

## 2019-11-23 ENCOUNTER — Other Ambulatory Visit: Payer: Self-pay | Admitting: Physician Assistant

## 2019-11-23 ENCOUNTER — Telehealth: Payer: Self-pay | Admitting: Internal Medicine

## 2019-11-23 DIAGNOSIS — C3412 Malignant neoplasm of upper lobe, left bronchus or lung: Secondary | ICD-10-CM

## 2019-11-23 NOTE — Telephone Encounter (Signed)
On call- Patient's son Sam called. Mother had reported to him that she had fallen and was short of breath. He and sister are now with her and report O2 sat on 3L 82%, then 90%.  She has metastatic cancer and had a hydropneumothorax at recent admit, so might recur.  He is reluctant to put her through long ER wait. Understands focus is on QOL per Dr Bari Mantis last phone message.  We agreed to let them try managing her at home if otherwise stable, turning O2 up to 4 or 5 if needed. If she gets worse then go to ER. OTW call our office Monday for update.

## 2019-11-24 ENCOUNTER — Other Ambulatory Visit: Payer: Self-pay

## 2019-11-24 ENCOUNTER — Emergency Department (HOSPITAL_COMMUNITY): Payer: Medicare Other

## 2019-11-24 ENCOUNTER — Inpatient Hospital Stay (HOSPITAL_COMMUNITY)
Admission: EM | Admit: 2019-11-24 | Discharge: 2019-12-01 | DRG: 180 | Disposition: E | Payer: Medicare Other | Attending: Internal Medicine | Admitting: Internal Medicine

## 2019-11-24 ENCOUNTER — Encounter (HOSPITAL_COMMUNITY): Payer: Self-pay | Admitting: Pharmacy Technician

## 2019-11-24 DIAGNOSIS — I4819 Other persistent atrial fibrillation: Secondary | ICD-10-CM | POA: Diagnosis present

## 2019-11-24 DIAGNOSIS — F419 Anxiety disorder, unspecified: Secondary | ICD-10-CM | POA: Diagnosis present

## 2019-11-24 DIAGNOSIS — C3492 Malignant neoplasm of unspecified part of left bronchus or lung: Secondary | ICD-10-CM | POA: Diagnosis not present

## 2019-11-24 DIAGNOSIS — Z791 Long term (current) use of non-steroidal anti-inflammatories (NSAID): Secondary | ICD-10-CM

## 2019-11-24 DIAGNOSIS — R64 Cachexia: Secondary | ICD-10-CM | POA: Diagnosis present

## 2019-11-24 DIAGNOSIS — Z66 Do not resuscitate: Secondary | ICD-10-CM | POA: Diagnosis present

## 2019-11-24 DIAGNOSIS — I4891 Unspecified atrial fibrillation: Secondary | ICD-10-CM

## 2019-11-24 DIAGNOSIS — Z9981 Dependence on supplemental oxygen: Secondary | ICD-10-CM | POA: Diagnosis not present

## 2019-11-24 DIAGNOSIS — Z79899 Other long term (current) drug therapy: Secondary | ICD-10-CM

## 2019-11-24 DIAGNOSIS — I4510 Unspecified right bundle-branch block: Secondary | ICD-10-CM | POA: Diagnosis present

## 2019-11-24 DIAGNOSIS — Z8249 Family history of ischemic heart disease and other diseases of the circulatory system: Secondary | ICD-10-CM

## 2019-11-24 DIAGNOSIS — J910 Malignant pleural effusion: Secondary | ICD-10-CM | POA: Diagnosis present

## 2019-11-24 DIAGNOSIS — J8410 Pulmonary fibrosis, unspecified: Secondary | ICD-10-CM | POA: Diagnosis not present

## 2019-11-24 DIAGNOSIS — G9340 Encephalopathy, unspecified: Secondary | ICD-10-CM | POA: Diagnosis present

## 2019-11-24 DIAGNOSIS — R339 Retention of urine, unspecified: Secondary | ICD-10-CM | POA: Diagnosis not present

## 2019-11-24 DIAGNOSIS — I110 Hypertensive heart disease with heart failure: Secondary | ICD-10-CM | POA: Diagnosis present

## 2019-11-24 DIAGNOSIS — C7951 Secondary malignant neoplasm of bone: Secondary | ICD-10-CM | POA: Diagnosis present

## 2019-11-24 DIAGNOSIS — C3432 Malignant neoplasm of lower lobe, left bronchus or lung: Secondary | ICD-10-CM | POA: Diagnosis present

## 2019-11-24 DIAGNOSIS — J9621 Acute and chronic respiratory failure with hypoxia: Secondary | ICD-10-CM | POA: Diagnosis present

## 2019-11-24 DIAGNOSIS — Z96641 Presence of right artificial hip joint: Secondary | ICD-10-CM | POA: Diagnosis present

## 2019-11-24 DIAGNOSIS — J9601 Acute respiratory failure with hypoxia: Secondary | ICD-10-CM | POA: Diagnosis present

## 2019-11-24 DIAGNOSIS — Z87891 Personal history of nicotine dependence: Secondary | ICD-10-CM

## 2019-11-24 DIAGNOSIS — Z20828 Contact with and (suspected) exposure to other viral communicable diseases: Secondary | ICD-10-CM | POA: Diagnosis present

## 2019-11-24 DIAGNOSIS — I5031 Acute diastolic (congestive) heart failure: Secondary | ICD-10-CM | POA: Diagnosis present

## 2019-11-24 DIAGNOSIS — E785 Hyperlipidemia, unspecified: Secondary | ICD-10-CM | POA: Diagnosis present

## 2019-11-24 DIAGNOSIS — E876 Hypokalemia: Secondary | ICD-10-CM | POA: Diagnosis present

## 2019-11-24 DIAGNOSIS — J84112 Idiopathic pulmonary fibrosis: Secondary | ICD-10-CM | POA: Diagnosis present

## 2019-11-24 DIAGNOSIS — I10 Essential (primary) hypertension: Secondary | ICD-10-CM | POA: Diagnosis present

## 2019-11-24 DIAGNOSIS — Z6820 Body mass index (BMI) 20.0-20.9, adult: Secondary | ICD-10-CM

## 2019-11-24 DIAGNOSIS — R0902 Hypoxemia: Secondary | ICD-10-CM

## 2019-11-24 DIAGNOSIS — Z515 Encounter for palliative care: Secondary | ICD-10-CM | POA: Diagnosis not present

## 2019-11-24 DIAGNOSIS — J90 Pleural effusion, not elsewhere classified: Secondary | ICD-10-CM

## 2019-11-24 DIAGNOSIS — Z880 Allergy status to penicillin: Secondary | ICD-10-CM

## 2019-11-24 DIAGNOSIS — Z923 Personal history of irradiation: Secondary | ICD-10-CM

## 2019-11-24 DIAGNOSIS — R0602 Shortness of breath: Secondary | ICD-10-CM

## 2019-11-24 LAB — CBC
HCT: 37.7 % (ref 36.0–46.0)
Hemoglobin: 12.7 g/dL (ref 12.0–15.0)
MCH: 32.1 pg (ref 26.0–34.0)
MCHC: 33.7 g/dL (ref 30.0–36.0)
MCV: 95.2 fL (ref 80.0–100.0)
Platelets: 581 10*3/uL — ABNORMAL HIGH (ref 150–400)
RBC: 3.96 MIL/uL (ref 3.87–5.11)
RDW: 12.6 % (ref 11.5–15.5)
WBC: 21 10*3/uL — ABNORMAL HIGH (ref 4.0–10.5)
nRBC: 0 % (ref 0.0–0.2)

## 2019-11-24 LAB — BASIC METABOLIC PANEL
Anion gap: 10 (ref 5–15)
BUN: 8 mg/dL (ref 8–23)
CO2: 32 mmol/L (ref 22–32)
Calcium: 9.8 mg/dL (ref 8.9–10.3)
Chloride: 88 mmol/L — ABNORMAL LOW (ref 98–111)
Creatinine, Ser: 0.54 mg/dL (ref 0.44–1.00)
GFR calc Af Amer: >60 mL/min (ref >60)
GFR calc non Af Amer: >60 mL/min (ref >60)
Glucose, Bld: 133 mg/dL — ABNORMAL HIGH (ref 70–99)
Potassium: 4.3 mmol/L (ref 3.5–5.1)
Sodium: 130 mmol/L — ABNORMAL LOW (ref 135–145)

## 2019-11-24 LAB — POCT I-STAT 7, (LYTES, BLD GAS, ICA,H+H)
Acid-Base Excess: 9 mmol/L — ABNORMAL HIGH (ref 0.0–2.0)
Bicarbonate: 36.5 mmol/L — ABNORMAL HIGH (ref 20.0–28.0)
Calcium, Ion: 1.24 mmol/L (ref 1.15–1.40)
HCT: 35 % — ABNORMAL LOW (ref 36.0–46.0)
Hemoglobin: 11.9 g/dL — ABNORMAL LOW (ref 12.0–15.0)
O2 Saturation: 89 %
Patient temperature: 98.6
Potassium: 3.4 mmol/L — ABNORMAL LOW (ref 3.5–5.1)
Sodium: 126 mmol/L — ABNORMAL LOW (ref 135–145)
TCO2: 38 mmol/L — ABNORMAL HIGH (ref 22–32)
pCO2 arterial: 62.7 mmHg — ABNORMAL HIGH (ref 32.0–48.0)
pH, Arterial: 7.373 (ref 7.350–7.450)
pO2, Arterial: 60 mmHg — ABNORMAL LOW (ref 83.0–108.0)

## 2019-11-24 LAB — RESPIRATORY PANEL BY RT PCR (FLU A&B, COVID)
Influenza A by PCR: NEGATIVE
Influenza B by PCR: NEGATIVE
SARS Coronavirus 2 by RT PCR: NEGATIVE

## 2019-11-24 LAB — POC SARS CORONAVIRUS 2 AG -  ED: SARS Coronavirus 2 Ag: NEGATIVE

## 2019-11-24 LAB — BRAIN NATRIURETIC PEPTIDE: B Natriuretic Peptide: 118.7 pg/mL — ABNORMAL HIGH (ref 0.0–100.0)

## 2019-11-24 LAB — PROCALCITONIN: Procalcitonin: 0.17 ng/mL

## 2019-11-24 LAB — TROPONIN I (HIGH SENSITIVITY)
Troponin I (High Sensitivity): 11 ng/L (ref <18)
Troponin I (High Sensitivity): 14 ng/L (ref <18)

## 2019-11-24 MED ORDER — ENOXAPARIN SODIUM 40 MG/0.4ML ~~LOC~~ SOLN
40.0000 mg | SUBCUTANEOUS | Status: DC
  Filled 2019-11-24: qty 0.4

## 2019-11-24 MED ORDER — ACETAMINOPHEN 325 MG PO TABS
650.0000 mg | ORAL_TABLET | Freq: Four times a day (QID) | ORAL | Status: DC | PRN
  Administered 2019-11-25: 650 mg via ORAL
  Filled 2019-11-24: qty 2

## 2019-11-24 MED ORDER — LABETALOL HCL 5 MG/ML IV SOLN
5.0000 mg | INTRAVENOUS | Status: DC | PRN
  Administered 2019-11-25 – 2019-11-26 (×3): 5 mg via INTRAVENOUS
  Filled 2019-11-24 (×3): qty 4

## 2019-11-24 MED ORDER — SODIUM CHLORIDE 0.9% FLUSH
3.0000 mL | Freq: Once | INTRAVENOUS | Status: AC
  Administered 2019-11-24: 3 mL via INTRAVENOUS

## 2019-11-24 MED ORDER — HEPARIN (PORCINE) 25000 UT/250ML-% IV SOLN
900.0000 [IU]/h | INTRAVENOUS | Status: DC
  Administered 2019-11-24: 900 [IU]/h via INTRAVENOUS
  Filled 2019-11-24: qty 250

## 2019-11-24 MED ORDER — DILTIAZEM HCL 25 MG/5ML IV SOLN
10.0000 mg | Freq: Once | INTRAVENOUS | Status: AC
  Administered 2019-11-24: 10 mg via INTRAVENOUS
  Filled 2019-11-24: qty 5

## 2019-11-24 MED ORDER — LORAZEPAM 2 MG/ML IJ SOLN
0.5000 mg | Freq: Once | INTRAMUSCULAR | Status: AC
  Administered 2019-11-24: 0.5 mg via INTRAVENOUS
  Filled 2019-11-24: qty 1

## 2019-11-24 MED ORDER — METOPROLOL TARTRATE 12.5 MG HALF TABLET
12.5000 mg | ORAL_TABLET | Freq: Two times a day (BID) | ORAL | Status: DC
  Administered 2019-11-25 (×2): 12.5 mg via ORAL
  Filled 2019-11-24 (×2): qty 1

## 2019-11-24 MED ORDER — HEPARIN BOLUS VIA INFUSION
3500.0000 [IU] | Freq: Once | INTRAVENOUS | Status: AC
  Administered 2019-11-24: 3500 [IU] via INTRAVENOUS
  Filled 2019-11-24: qty 3500

## 2019-11-24 MED ORDER — VANCOMYCIN HCL IN DEXTROSE 1-5 GM/200ML-% IV SOLN: 1000.0000 mg | INTRAVENOUS | Status: DC

## 2019-11-24 MED ORDER — SODIUM CHLORIDE 0.9 % IV SOLN: 2.0000 g | Freq: Two times a day (BID) | INTRAVENOUS | Status: DC

## 2019-11-24 MED ORDER — VANCOMYCIN HCL 1250 MG/250ML IV SOLN
1250.0000 mg | Freq: Once | INTRAVENOUS | Status: AC
  Administered 2019-11-24: 1250 mg via INTRAVENOUS
  Filled 2019-11-24: qty 250

## 2019-11-24 MED ORDER — SODIUM CHLORIDE 0.9 % IV SOLN
2.0000 g | Freq: Two times a day (BID) | INTRAVENOUS | Status: DC
  Administered 2019-11-24 – 2019-11-26 (×5): 2 g via INTRAVENOUS
  Filled 2019-11-24 (×6): qty 2

## 2019-11-24 MED ORDER — FUROSEMIDE 10 MG/ML IJ SOLN
40.0000 mg | Freq: Once | INTRAMUSCULAR | Status: AC
  Administered 2019-11-24: 40 mg via INTRAVENOUS
  Filled 2019-11-24: qty 4

## 2019-11-24 NOTE — Progress Notes (Addendum)
Pharmacy Antibiotic Note  [name] [name] is a 78 y.o. female admitted on 11/25/2019 with pneumonia.  P harmacy has been consulted for Cefepime and Vancomycin dosing.     Temp (24hrs), Avg:98.5 F (36.9 C), Min:98.5 F (36.9 C), Max:98.5 F (36.9 C)  Recent Labs  Lab 11/18/19 0314 11/19/19 0428 11/20/19 0251 11/21/19 0215 11/07/2019 1302  WBC 11.9*  --   --  11.4* 21.0*  CREATININE 0.56 0.48 0.49 0.53 0.54    Estimated Creatinine Clearance: 47.3 mL/min (by C-G formula based on SCr of 0.54 mg/dL).    Allergies  Allergen Reactions  . Penicillins Hives, Swelling and Rash    Has patient had a PCN reaction causing immediate rash, facial/tongue/throat swelling, SOB or lightheadedness with hypotension: Yes Has patient had a PCN reaction causing severe rash involving mucus membranes or skin necrosis: No Has patient had a PCN reaction that required hospitalization: No Has patient had a PCN reaction occurring within the last 10 years: No If all of the above answers are "NO", then may proceed with Cephalosporin use.     Antimicrobials this admission: 12/25 Cefepime >>  12/25 Vancomycin >>   Dose adjustments this admission:   Microbiology results:  Plan:  - Cefepime 2g IV q12h  - Vancomycin 1250mg  IV x 1 dose  - Followed by Vancomycin 1000mg  IV q24h  - Est Calc AUC 538 - Monitor patients renal fxn and urine output   Thank you for allowing pharmacy to be a part of this patient's care.  Duanne Limerick PharmD . BCPS  11/05/2019 4:17 PM

## 2019-11-24 NOTE — H&P (Signed)
History and Physical    Samon Dishner PRF:[id number] DOB: 31-Mar-1941 DOA: 11/23/2019  PCP: Alroy Dust, L.Marlou Sa, MD  Patient coming from: Home  I have personally briefly reviewed patient's old medical records in [place]  Chief Complaint: worsening shortness of breath   HPI: Jamye Balicki is a 78 y.o. 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 She has had to increase her baseline 2L up to 4-5L without relieve.  No fever. No cough, runny nose. No chest pain.   She was just discharged on 12/22 after being admitted from 12/17 for hydropneumothorax and underwent thoracentesis with exudative fluid. Cytology was positive for non-small cell lung cancer. There was concerns of metastatic disease seen on PET scan and MRI thoracic spine with abnormal enhancement at T3,T4,T5, and T6. MRI brain negative. Pulmonology was consulted. She has outpatient following up with oncology and lymph node biopsy with IR for more sample for EGFR markers.   Chest x-ray shows progressive infiltrate in the right mid and lower lung zone superimposed on chronic interstitial lung disease.  No change in loculated left pleural effusion.  Slight decreased aeration of left lung base.  She was afebrile and hypertensive up to 172/11 in atrial fibrillation with RVR up to 120-130. 90% on NRB 15L. Intolerant of first trial of Bipap. Ativan being given for second attempt. Pt does not want intubation.   WBC of 21K, platelet of 581. Na of 130, K of 4.3, Glucose of 133, creatinine of 0.54.  BNP of 118. Troponin of 11.  POC COVID negative.   Review of Systems:  Constitutional: No Weight  Change, No Fever ENT/Mouth: No sore throat, No Rhinorrhea Eyes: No Eye Pain, No Vision Changes Cardiovascular: No Chest Pain, no SOB, No PND, No Dyspnea on Exertion, No Orthopnea, No Claudication, No Edema, No Palpitations Respiratory: No Cough, No Sputum, No Wheezing, no Dyspnea  Gastrointestinal: No Nausea, No Vomiting, No Diarrhea, No Constipation, No Pain Genitourinary: no Urinary Incontinence, No Urgency, No Flank Pain Musculoskeletal: No Arthralgias, No Myalgias Skin: No Skin Lesions, No Pruritus, Neuro: no Weakness, No Numbness,  No Loss of Consciousness, No Syncope Psych: No Anxiety/Panic, No Depression, no decrease appetite Heme/Lymph: No Bruising, No Bleeding  Past Medical History:  Diagnosis Date  . Anxiety   . Arthritis    a. 10/2012 s/p Right THA.  Marland Kitchen Arthritis    "hands" (09/21/2017)  . Chronic lower back pain   . Complication of anesthesia    "couldn't wake me up after my hip surgery" (09/21/2017)  . Depression   . History of ARDS   . Hyperlipidemia   . Hypertension   . Lung cancer ([place]) 02/16/13   LUL lung SQUAMOUS CELL  . Lung cancer ([place]) dx'd 12/2013    LLL ADENOCARCINOMA  . Pulmonary fibrosis ([place]) dx'd ~ 07/2017  . Pulmonary nodule    a. 10/2012 CT: 64m anterior LUL nodule w/ spiculated appearance - rec PET CT.  . Seasonal allergies    "I take Claritin" (09/21/2017)  . Sinus headache     Past Surgical History:  Procedure Laterality Date  . BACK SURGERY    . CATARACT EXTRACTION W/ INTRAOCULAR LENS  IMPLANT, BILATERAL Bilateral   . EYE SURGERY Right ~  1949   "removed a growth"  . IR THORACENTESIS ASP PLEURAL SPACE W/IMG GUIDE  11/17/2019  . JOINT REPLACEMENT    . LUMBAR [place] SURGERY  08/2016   "realigned discs; done in [place]; @ [facility]"  . LUNG BIOPSY Left 02/16/13   LUL =non small cell ca with assoc/necrosis  . TONSILLECTOMY    . TOTAL HIP ARTHROPLASTY  11/03/2012   Procedure: TOTAL HIP ARTHROPLASTY ANTERIOR APPROACH;  Surgeon: Mcarthur Rossetti, MD;  Location: [facility];  Service: Orthopedics;  Laterality: Right;  Right Total Hip Arthroplasty, Anterior Approach (C-Arm)  . TUBAL LIGATION       reports that she quit smoking about 7 years ago. Her smoking use included cigarettes. She has a 40.00 pack-year smoking history. She has never used smokeless tobacco. She reports current alcohol use of about 17.0 standard drinks of alcohol per week. She reports that she does not use drugs.  Allergies  Allergen Reactions  . Penicillins Hives, Swelling and Rash    Has patient had a PCN reaction causing immediate rash, facial/tongue/throat swelling, SOB or lightheadedness with hypotension: Yes Has patient had a PCN reaction causing severe rash involving mucus membranes or skin necrosis: No Has patient had a PCN reaction that required hospitalization: No Has patient had a PCN reaction occurring within the last 10 years: No If all of the above answers are "NO", then may proceed with Cephalosporin use.     Family History  Problem Relation Age of Onset  . CAD Father   . Lung disease Neg Hx   . Rheumatologic disease Neg Hx      Prior to Admission medications   Medication Sig Start Date End Date Taking? Authorizing Provider  acetaminophen (TYLENOL) 500 MG tablet Take 500-1,000 mg by mouth every 6 (six) hours as needed for moderate pain.     [provider]  amLODipine (NORVASC) 10 MG tablet Take 1 tablet (10 mg total) by mouth daily. 06/29/17   Rigoberto Noel, MD  atorvastatin (LIPITOR) 40 MG tablet Take 20 mg by mouth every morning.    [provider]  celecoxib (CELEBREX) 100 MG capsule Take 100 mg by mouth 2 (two) times daily. 10/28/19   [provider]  Hypromellose (ARTIFICIAL TEARS OP) Place 1 drop into both eyes daily.    [provider]  loperamide (IMODIUM) 2 MG capsule Take 2 mg by mouth as needed for diarrhea or loose stools.    [provider]  loratadine (CLARITIN) 10 MG tablet Take  10 mg by mouth daily.    [provider]  OFEV 100 MG CAPS TAKE 1 CAPSULE BY MOUTH TWICE A DAY Patient taking differently: Take 100 mg by mouth 2 (two) times daily.  10/05/19   Rigoberto Noel, MD  traMADol (ULTRAM) 50 MG tablet Take 1 tablet (50 mg total) by mouth every 6 (six) hours as needed for moderate pain or severe pain. 11/21/19   Dessa Phi, DO    Physical Exam: Vitals:   11/07/2019 1500 11/04/2019 1545 11/02/2019 1550 11/06/2019 1600  BP: (!) 172/112 (!) 169/99  131/76  Pulse: (!) 134 (!) 124 (!) 125 (!) 121  Resp:  20 (!) 28 (!) 21  Temp:      SpO2: 90% (!) 89% 90% 92%    Constitutional: ill appearing anxious thin cachetic female on NRB and dyspnic  Vitals:   11/12/2019 1500 11/02/2019 1545 11/07/2019 1550 11/04/2019 1600  BP: (!) 172/112 (!) 169/99  131/76  Pulse: Marland Kitchen)  134 (!) 124 (!) 125 (!) 121  Resp:  20 (!) 28 (!) 21  Temp:      SpO2: 90% (!) 89% 90% 92%   Eyes: PERRL, lids and conjunctivae normal ENMT: Mucous membranes are moist.  Neck: normal, supple Respiratory: poor aeration with diffuse crackles and rhonchi, tachypneic on NRB. Cardiovascular: Regular rate and rhythm, no murmurs / rubs / gallops. No extremity edema.  Abdomen: no tenderness. Bowel sounds positive.  Musculoskeletal: no clubbing / cyanosis. No joint deformity upper and lower extremities.  Normal muscle tone.  Skin: no rashes, lesions, ulcers. No induration Neurologic: CN 2-12 grossly intact. Sensation intact. Strength 5/5 in all 4.  Psychiatric: Normal judgment and insight. Alert and oriented x 3. anxious   Labs on Admission: I have personally reviewed following labs and imaging studies  CBC: Recent Labs  Lab 11/18/19 0314 11/21/19 0215 11/15/2019 1302  WBC 11.9* 11.4* 21.0*  HGB 12.4 12.1 12.7  HCT 35.6* 35.5* 37.7  MCV 92.0 93.9 95.2  PLT 403* 400 222*   Basic Metabolic Panel: Recent Labs  Lab 11/18/19 0314 11/19/19 0428 11/20/19 0251 11/21/19 0215 11/30/2019 1302  NA 129* 128* 128*  129* 130*  K 3.1* 3.5 3.8 3.8 4.3  CL 84* 87* 87* 85* 88*  CO2 36* 31 32 33* 32  GLUCOSE 115* 107* 100* 106* 133*  BUN 7* 7* 5* 5* 8  CREATININE 0.56 0.48 0.49 0.53 0.54  CALCIUM 9.3 9.0 9.0 9.4 9.8   GFR: Estimated Creatinine Clearance: 47.3 mL/min (by C-G formula based on SCr of 0.54 mg/dL). Liver Function Tests: No results for input(s): AST, ALT, ALKPHOS, BILITOT, PROT, ALBUMIN in the last 168 hours. No results for input(s): LIPASE, AMYLASE in the last 168 hours. No results for input(s): AMMONIA in the last 168 hours. Coagulation Profile: No results for input(s): INR, PROTIME in the last 168 hours. Cardiac Enzymes: No results for input(s): CKTOTAL, CKMB, CKMBINDEX, TROPONINI in the last 168 hours. BNP (last 3 results) No results for input(s): PROBNP in the last 8760 hours. HbA1C: No results for input(s): HGBA1C in the last 72 hours. CBG: No results for input(s): GLUCAP in the last 168 hours. Lipid Profile: No results for input(s): CHOL, HDL, LDLCALC, TRIG, CHOLHDL, LDLDIRECT in the last 72 hours. Thyroid Function Tests: No results for input(s): TSH, T4TOTAL, FREET4, T3FREE, THYROIDAB in the last 72 hours. Anemia Panel: No results for input(s): VITAMINB12, FOLATE, FERRITIN, TIBC, IRON, RETICCTPCT in the last 72 hours. Urine analysis:    Component Value Date/Time   COLORURINE YELLOW 11/09/2012 1034   APPEARANCEUR CLOUDY (A) 11/09/2012 1034   LABSPEC 1.020 11/09/2012 1034   PHURINE 6.0 11/09/2012 1034   GLUCOSEU NEGATIVE 11/09/2012 1034   HGBUR TRACE (A) 11/09/2012 1034   BILIRUBINUR NEGATIVE 11/09/2012 1034   KETONESUR NEGATIVE 11/09/2012 1034   PROTEINUR 30 (A) 11/09/2012 1034   UROBILINOGEN 1.0 11/09/2012 1034   NITRITE NEGATIVE 11/09/2012 1034   LEUKOCYTESUR SMALL (A) 11/09/2012 1034    Radiological Exams on Admission: DG Chest Port 1 View  Result Date: 11/05/2019 CLINICAL DATA:  Shortness of breath. Lung carcinoma. EXAM: PORTABLE CHEST 1 VIEW COMPARISON:   11/20/2019, 11/18/2019 and 11/17/2019 and 09/22/2017 FINDINGS: There is progression of the hazy infiltrate in the right mid and lower lung zones superimposed on chronic interstitial lung disease. The left loculated pleural effusion is unchanged. The area of consolidation in the left perihilar region is unchanged. Slight decreased aeration at the left lung base where there is extensive chronic interstitial  disease. Heart size and pulmonary vascularity are normal. Aortic atherosclerosis. No acute bone abnormality. No visible residual pneumothorax. IMPRESSION: 1. Progressive infiltrate in the right mid and lower lung zones superimposed on chronic interstitial lung disease. 2. No change in the loculated left pleural effusion. 3. Slight decreased aeration at the left lung base. Electronically Signed   By: Lorriane Shire M.D.   On: 11/07/2019 14:13    EKG: Independently reviewed.   Assessment/Plan  Acute on chronic hypoxic respiratory failure in the setting of IPF, hx of lung adenocarcinoma with possible new metastatic disease, and questionable new pneumonia Baseline on 2L now on NRB 15L  Trial Bipap but pt refuses due to anxiety even with ativan given- pt understand the next step is intubation but she does not want intubation and still refused Bipap. Spoke with critical care/pulmonology and no other recommendation other than comfort measures if pt refuses further intervention. CXR compared with one on 12/21 appears to have new right sided infiltrate. WBC at 21 which is more elevated than a few days ago.  Will start on Vancomycin and Cefepime- check PCT  RVP and repeat COVID PCR pending  Lasix 1100m x 1 given- follow I&O, daily weight Continue OFEV  Atrial fibrillation with RVR could be secondary to hypertension Appears to be new diagnosis CHA2DS2-VasC score of 4  Start Heparin gtt  EF of 60-65% on echo on 12/18  IV diltiazem 147m Start metoprolol 12.100m64mID  Hypertension  Continue  amlodipine Labetalol for Systolic greater than 180580 diastolic greater than 110638VT prophylaxis: Heparin gtt Code Status:DNR/DNI- verified with patient multiple times Family Communication: Plan discussed with patient at bedside and discussed with son over the phone disposition Plan: Home with at least 2 midnight stays  Consults called:  Admission status: inpatient, SDU, prognosis is poor especially with pt refusal on Bipap.  ChiOrene Desanctis [facility]   If 7PM-7AM, please contact night-coverage [email or web address] Password [email or web address]  11/13/2019, 4:10 PM

## 2019-11-24 NOTE — Progress Notes (Signed)
ANTICOAGULATION CONSULT NOTE - Initial Consult  Pharmacy Consult for Heparin Indication: atrial fibrillation  Allergies  Allergen Reactions  . Penicillins Hives, Swelling and Rash    Has patient had a PCN reaction causing immediate rash, facial/tongue/throat swelling, SOB or lightheadedness with hypotension: Yes Has patient had a PCN reaction causing severe rash involving mucus membranes or skin necrosis: No Has patient had a PCN reaction that required hospitalization: No Has patient had a PCN reaction occurring within the last 10 years: No If all of the above answers are "NO", then may proceed with Cephalosporin use.     Patient Measurements:   Heparin Dosing Weight: 59.1  Vital Signs: Temp: 98.5 F (36.9 C) (12/25 1243) BP: 131/76 (12/25 1600) Pulse Rate: 121 (12/25 1600)  Labs: Recent Labs    11/08/2019 1302  HGB 12.7  HCT 37.7  PLT 581*  CREATININE 0.54  TROPONINIHS 11    Estimated Creatinine Clearance: 47.3 mL/min (by C-G formula based on SCr of 0.54 mg/dL).   Medical History: Past Medical History:  Diagnosis Date  . Anxiety   . Arthritis    a. 10/2012 s/p Right THA.  Marland Kitchen Arthritis    "hands" (09/21/2017)  . Chronic lower back pain   . Complication of anesthesia    "couldn't wake me up after my hip surgery" (09/21/2017)  . Depression   . History of ARDS   . Hyperlipidemia   . Hypertension   . Lung cancer ([place]) 02/16/13   LUL lung SQUAMOUS CELL  . Lung cancer ([place]) dx'd 12/2013    LLL ADENOCARCINOMA  . Pulmonary fibrosis ([place]) dx'd ~ 07/2017  . Pulmonary nodule    a. 10/2012 CT: 52mm anterior LUL nodule w/ spiculated appearance - rec PET CT.  . Seasonal allergies    "I take Claritin" (09/21/2017)  . Sinus headache     Medications:  Scheduled:  . enoxaparin (LOVENOX) injection  40 mg Subcutaneous Q24H  . heparin  3,500 Units Intravenous Once  . sodium chloride flush  3 mL Intravenous Once    Assessment: Patient is a 61 yof that presented to the  ED with shortness of breath. Patient was just discharged after being admitted for left hydropneumothorax. Patient was in Afib with RVR and looking at last admission EKG patient was in [place] then. Pharmacy has been asked to dose heparin at this time for Afib.   Goal of Therapy:  Heparin level 0.3-0.7 units/ml Monitor platelets by anticoagulation protocol: Yes   Plan:  - Heparin bolus of 3500 units IV x 1 dose  - Followed by Heparin drip @ 900 units/hr  - Heparin level in 8 hours  - Monitor patient for s/s of bleeding and CBC daily   Duanne Limerick PharmD. BCPS  11/22/2019,4:28 PM

## 2019-11-24 NOTE — ED Notes (Signed)
ED Provider at bedside. 

## 2019-11-24 NOTE — ED Notes (Signed)
Admitting provider at bedside.

## 2019-11-24 NOTE — ED Notes (Signed)
In to assess pt, pt appears in mild respiratory distress. Pulling at [facility], but reports "I feel a little bit better". Pad saturated w/ urine, changed and pt repositioned and sat up. Spoke w/ MD Gilford Raid about trying a HFNC to see it pt could tolerate better than full mask NRB or BiPAP which was causing extreme anxiety earlier. MD agreeable to plan. Respiratory notified.

## 2019-11-24 NOTE — ED Triage Notes (Signed)
Pt bib ems from home with SOBX2 days. Hx pneumo last week. Pt diminished on L side. Hx cystic fibrosis and lung CA. +edema BLE. 18g LAC.  4L [place] 82% NRB 89% 180/100 RR 34 HR 130

## 2019-11-24 NOTE — ED Provider Notes (Signed)
[facility] EMERGENCY DEPARTMENT Provider Note   CSN: [id number] Arrival date & time: 11/03/2019  1234     History No chief complaint on file.   [name] [name] is a 78 y.o. female.  HPI    Patient with history of lung cancer recently admitted for left hydropneumothorax.  Was discharged 3 days ago.  Patient states over the last day she has had regular worsening shortness of breath.  Worse with lying flat.  She is also noticed some increased swelling to her bilateral lower extremities.  Denies new cough, fever or chills.  No new Covid exposures. Past Medical History:  Diagnosis Date  . Anxiety   . Arthritis    a. 10/2012 s/p Right THA.  Marland Kitchen Arthritis    "hands" (09/21/2017)  . Chronic lower back pain   . Complication of anesthesia    "couldn't wake me up after my hip surgery" (09/21/2017)  . Depression   . History of ARDS   . Hyperlipidemia   . Hypertension   . Lung cancer ([place]) 02/16/13   LUL lung SQUAMOUS CELL  . Lung cancer ([place]) dx'd 12/2013    LLL ADENOCARCINOMA  . Pulmonary fibrosis ([place]) dx'd ~ 07/2017  . Pulmonary nodule    a. 10/2012 CT: 4mm anterior LUL nodule w/ spiculated appearance - rec PET CT.  . Seasonal allergies    "I take Claritin" (09/21/2017)  . Sinus headache     Patient Active Problem List   Diagnosis Date Noted  . Malignant pleural effusion   . Metastatic cancer to bone ([place])   . Hydropneumothorax 11/17/2019  . Dyspnea 11/16/2019  . Chronic respiratory failure with hypoxia ([place]) 11/05/2017  . Idiopathic pulmonary fibrosis ([place]) 09/21/2017  . Hypoxia   . Mediastinal lymphadenopathy 06/29/2017  . 24.8 mm well differentiated adenocarcinoma of the left lower lobe of the lung - clinical stage IA 12/28/2013  . 18 mm squamous cell carcinoma of the left upper lobe of the lung - clinical stage IA 02/16/2013  . Pedal edema 01/02/2013  . Hoarseness, persistent 01/02/2013  . Hypertension 12/02/2012  . Pulmonary nodule 12/02/2012  .  Pulmonary fibrosis ([place]) 12/01/2012  . Normocytic anemia 11/07/2012  . S/P total hip arthroplasty 11/06/2012  . Degenerative arthritis of hip 11/03/2012    Past Surgical History:  Procedure Laterality Date  . BACK SURGERY    . CATARACT EXTRACTION W/ INTRAOCULAR LENS  IMPLANT, BILATERAL Bilateral   . EYE SURGERY Right ~ 1949   "removed a growth"  . IR THORACENTESIS ASP PLEURAL SPACE W/IMG GUIDE  11/17/2019  . JOINT REPLACEMENT    . LUMBAR [place] SURGERY  08/2016   "realigned discs; done in [place]; @ [facility]"  . LUNG BIOPSY Left 02/16/13   LUL =non small cell ca with assoc/necrosis  . TONSILLECTOMY    . TOTAL HIP ARTHROPLASTY  11/03/2012   Procedure: TOTAL HIP ARTHROPLASTY ANTERIOR APPROACH;  Surgeon: Mcarthur Rossetti, MD;  Location: [facility] ORS;  Service: Orthopedics;  Laterality: Right;  Right Total Hip Arthroplasty, Anterior Approach (C-Arm)  . TUBAL LIGATION       OB History   No obstetric history on file.     Family History  Problem Relation Age of Onset  . CAD Father   . Lung disease Neg Hx   . Rheumatologic disease Neg Hx     Social History   Tobacco Use  . Smoking status: Former Smoker    Packs/day: 1.00    Years: 40.00    Pack  years: 40.00    Types: Cigarettes    Quit date: 11/03/2012    Years since quitting: 7.0  . Smokeless tobacco: Never Used  Substance Use Topics  . Alcohol use: Yes    Alcohol/week: 17.0 standard drinks    Types: 17 Shots of liquor per week    Comment: 09/21/2017 "2-3 shots of liquor /night"  . Drug use: No    Home Medications Prior to Admission medications   Medication Sig Start Date End Date Taking? Authorizing Provider  acetaminophen (TYLENOL) 500 MG tablet Take 500-1,000 mg by mouth every 6 (six) hours as needed for moderate pain.     [provider]  amLODipine (NORVASC) 10 MG tablet Take 1 tablet (10 mg total) by mouth daily. 06/29/17   Rigoberto Noel, MD  atorvastatin (LIPITOR) 40 MG tablet Take 20 mg by mouth every  morning.    [provider]  celecoxib (CELEBREX) 100 MG capsule Take 100 mg by mouth 2 (two) times daily. 10/28/19   [provider]  Hypromellose (ARTIFICIAL TEARS OP) Place 1 drop into both eyes daily.    [provider]  loperamide (IMODIUM) 2 MG capsule Take 2 mg by mouth as needed for diarrhea or loose stools.    [provider]  loratadine (CLARITIN) 10 MG tablet Take 10 mg by mouth daily.    [provider]  OFEV 100 MG CAPS TAKE 1 CAPSULE BY MOUTH TWICE A DAY Patient taking differently: Take 100 mg by mouth 2 (two) times daily.  10/05/19   Rigoberto Noel, MD  traMADol (ULTRAM) 50 MG tablet Take 1 tablet (50 mg total) by mouth every 6 (six) hours as needed for moderate pain or severe pain. 11/21/19   Dessa Phi, DO    Allergies    Penicillins  Review of Systems   Review of Systems  Constitutional: Negative for chills and fever.  HENT: Negative for trouble swallowing.   Eyes: Negative for visual disturbance.  Respiratory: Positive for shortness of breath. Negative for cough and wheezing.   Cardiovascular: Positive for leg swelling. Negative for chest pain.  Gastrointestinal: Negative for abdominal pain, constipation, diarrhea, nausea and vomiting.  Musculoskeletal: Negative for back pain, myalgias and neck pain.  Skin: Negative for rash and wound.  Neurological: Negative for dizziness, weakness, light-headedness, numbness and headaches.  All other systems reviewed and are negative.   Physical Exam Updated Vital Signs BP (!) 178/97   Pulse (!) 126   Temp 98.5 F (36.9 C)   Resp (!) 34   SpO2 96%   Physical Exam Vitals and nursing note reviewed.  Constitutional:      Appearance: She is well-developed.  HENT:     Head: Normocephalic and atraumatic.     Nose: Nose normal.     Mouth/Throat:     Mouth: Mucous membranes are moist.  Eyes:     Pupils: Pupils are equal, round, and reactive to light.  Cardiovascular:     Rate  and Rhythm: Normal rate and regular rhythm.  Pulmonary:     Comments: Increased respiratory effort.  Crackles in bilateral bases. Abdominal:     General: Bowel sounds are normal.     Palpations: Abdomen is soft.     Tenderness: There is no abdominal tenderness. There is no guarding or rebound.  Musculoskeletal:        General: No tenderness. Normal range of motion.     Cervical back: Normal range of motion and neck supple.  Right lower leg: Edema present.     Left lower leg: Edema present.     Comments: 1+ bilateral lower extremity edema.  No asymmetry or calf tenderness.  Skin:    General: Skin is warm and dry.     Findings: No erythema or rash.  Neurological:     General: No focal deficit present.     Mental Status: She is alert and oriented to person, place, and time.  Psychiatric:     Comments: Mildly anxious appearing     ED Results / Procedures / Treatments   Labs (all labs ordered are listed, but only abnormal results are displayed) Labs Reviewed  BASIC METABOLIC PANEL - Abnormal; Notable for the following components:      Result Value   Sodium 130 (*)    Chloride 88 (*)    Glucose, Bld 133 (*)    All other components within normal limits  CBC - Abnormal; Notable for the following components:   WBC 21.0 (*)    Platelets 581 (*)    All other components within normal limits  BRAIN NATRIURETIC PEPTIDE - Abnormal; Notable for the following components:   B Natriuretic Peptide 118.7 (*)    All other components within normal limits  RESPIRATORY PANEL BY RT PCR (FLU A&B, COVID)  POC SARS CORONAVIRUS 2 AG -  ED  TROPONIN I (HIGH SENSITIVITY)  TROPONIN I (HIGH SENSITIVITY)    EKG EKG Interpretation  Date/Time:  Friday November 24 2019 12:41:19 EST Ventricular Rate:  129 PR Interval:    QRS Duration: 106 QT Interval:  378 QTC Calculation: 553 R Axis:   36 Text Interpretation: Atrial fibrillation with rapid ventricular response Incomplete right bundle branch  block Nonspecific ST and T wave abnormality Abnormal ECG Confirmed by Julianne Rice [phone]) on 11/08/2019 2:43:20 PM   Radiology DG Chest Port 1 View  Result Date: 10/31/2019 CLINICAL DATA:  Shortness of breath. Lung carcinoma. EXAM: PORTABLE CHEST 1 VIEW COMPARISON:  11/20/2019, 11/18/2019 and 11/17/2019 and 09/22/2017 FINDINGS: There is progression of the hazy infiltrate in the right mid and lower lung zones superimposed on chronic interstitial lung disease. The left loculated pleural effusion is unchanged. The area of consolidation in the left perihilar region is unchanged. Slight decreased aeration at the left lung base where there is extensive chronic interstitial disease. Heart size and pulmonary vascularity are normal. Aortic atherosclerosis. No acute bone abnormality. No visible residual pneumothorax. IMPRESSION: 1. Progressive infiltrate in the right mid and lower lung zones superimposed on chronic interstitial lung disease. 2. No change in the loculated left pleural effusion. 3. Slight decreased aeration at the left lung base. Electronically Signed   By: Lorriane Shire M.D.   On: 11/11/2019 14:13    Procedures Procedures (including critical care time)  Medications Ordered in ED Medications  sodium chloride flush (NS) 0.9 % injection 3 mL (has no administration in time range)  furosemide (LASIX) injection 40 mg (has no administration in time range)  diltiazem (CARDIZEM) injection 10 mg (has no administration in time range)    ED Course  I have reviewed the triage vital signs and the nursing notes.  Pertinent labs & imaging results that were available during my care of the patient were reviewed by me and considered in my medical decision making (see chart for details).    MDM Rules/Calculators/A&P                      Reviewed pulmonology's notes.  Appears  patient and son interested in possible hospice.  States she does not want to be intubated.  Minimal movement causes  patient to desaturate.  Will place on BiPAP.   Spoke with hospitalist.  After discussion we will give a single dose of Cardizem for rate control and blood pressure control.  Will see patient in the emergency department and admit. Final Clinical Impression(s) / ED Diagnoses Final diagnoses:  Acute on chronic respiratory failure with hypoxia [facility])    Rx / DC Orders ED Discharge Orders    None       Julianne Rice, MD 11/25/2019 1443

## 2019-11-25 DIAGNOSIS — I4891 Unspecified atrial fibrillation: Secondary | ICD-10-CM

## 2019-11-25 DIAGNOSIS — I5031 Acute diastolic (congestive) heart failure: Secondary | ICD-10-CM

## 2019-11-25 LAB — BASIC METABOLIC PANEL
Anion gap: 11 (ref 5–15)
Anion gap: 13 (ref 5–15)
BUN: 12 mg/dL (ref 8–23)
BUN: 8 mg/dL (ref 8–23)
CO2: 31 mmol/L (ref 22–32)
CO2: 34 mmol/L — ABNORMAL HIGH (ref 22–32)
Calcium: 9.4 mg/dL (ref 8.9–10.3)
Calcium: 9.6 mg/dL (ref 8.9–10.3)
Chloride: 87 mmol/L — ABNORMAL LOW (ref 98–111)
Chloride: 91 mmol/L — ABNORMAL LOW (ref 98–111)
Creatinine, Ser: 0.45 mg/dL (ref 0.44–1.00)
Creatinine, Ser: 0.59 mg/dL (ref 0.44–1.00)
GFR calc Af Amer: >60 mL/min (ref >60)
GFR calc Af Amer: >60 mL/min (ref >60)
GFR calc non Af Amer: >60 mL/min (ref >60)
GFR calc non Af Amer: >60 mL/min (ref >60)
Glucose, Bld: 115 mg/dL — ABNORMAL HIGH (ref 70–99)
Glucose, Bld: 88 mg/dL (ref 70–99)
Potassium: 3.4 mmol/L — ABNORMAL LOW (ref 3.5–5.1)
Potassium: 5.1 mmol/L (ref 3.5–5.1)
Sodium: 131 mmol/L — ABNORMAL LOW (ref 135–145)
Sodium: 136 mmol/L (ref 135–145)

## 2019-11-25 LAB — CBC
HCT: 36 % (ref 36.0–46.0)
Hemoglobin: 12.2 g/dL (ref 12.0–15.0)
MCH: 32.3 pg (ref 26.0–34.0)
MCHC: 33.9 g/dL (ref 30.0–36.0)
MCV: 95.2 fL (ref 80.0–100.0)
Platelets: 538 10*3/uL — ABNORMAL HIGH (ref 150–400)
RBC: 3.78 MIL/uL — ABNORMAL LOW (ref 3.87–5.11)
RDW: 12.7 % (ref 11.5–15.5)
WBC: 21.1 10*3/uL — ABNORMAL HIGH (ref 4.0–10.5)
nRBC: 0 % (ref 0.0–0.2)

## 2019-11-25 LAB — HEPARIN LEVEL (UNFRACTIONATED)
Heparin Unfractionated: 0.33 IU/mL (ref 0.30–0.70)
Heparin Unfractionated: 0.43 IU/mL (ref 0.30–0.70)

## 2019-11-25 LAB — MAGNESIUM: Magnesium: 1.8 mg/dL (ref 1.7–2.4)

## 2019-11-25 MED ORDER — AMLODIPINE BESYLATE 10 MG PO TABS
10.0000 mg | ORAL_TABLET | Freq: Every day | ORAL | Status: DC
  Administered 2019-11-25: 10 mg via ORAL
  Filled 2019-11-25: qty 1

## 2019-11-25 MED ORDER — TRAMADOL HCL 50 MG PO TABS: 50.0000 mg | ORAL_TABLET | Freq: Four times a day (QID) | ORAL | Status: DC | PRN

## 2019-11-25 MED ORDER — POTASSIUM CHLORIDE 10 MEQ/100ML IV SOLN
10.0000 meq | INTRAVENOUS | Status: DC
  Administered 2019-11-25 (×4): 10 meq via INTRAVENOUS
  Filled 2019-11-25 (×3): qty 100

## 2019-11-25 MED ORDER — NINTEDANIB ESYLATE 100 MG PO CAPS: 100.0000 mg | ORAL_CAPSULE | Freq: Two times a day (BID) | ORAL | Status: DC

## 2019-11-25 MED ORDER — FUROSEMIDE 10 MG/ML IJ SOLN
40.0000 mg | Freq: Once | INTRAMUSCULAR | Status: AC
  Administered 2019-11-25: 40 mg via INTRAVENOUS
  Filled 2019-11-25: qty 4

## 2019-11-25 MED ORDER — LORATADINE 10 MG PO TABS
10.0000 mg | ORAL_TABLET | Freq: Every day | ORAL | Status: DC
  Administered 2019-11-25: 10 mg via ORAL
  Filled 2019-11-25: qty 1

## 2019-11-25 MED ORDER — TRAZODONE HCL 50 MG PO TABS
50.0000 mg | ORAL_TABLET | Freq: Every day | ORAL | Status: DC
  Administered 2019-11-25: 50 mg via ORAL
  Filled 2019-11-25: qty 1

## 2019-11-25 MED ORDER — METOPROLOL TARTRATE 50 MG PO TABS
50.0000 mg | ORAL_TABLET | Freq: Two times a day (BID) | ORAL | Status: DC
  Administered 2019-11-25 (×2): 50 mg via ORAL
  Filled 2019-11-25 (×2): qty 1

## 2019-11-25 MED ORDER — ATORVASTATIN CALCIUM 10 MG PO TABS
20.0000 mg | ORAL_TABLET | Freq: Every morning | ORAL | Status: DC
  Administered 2019-11-25: 20 mg via ORAL
  Filled 2019-11-25: qty 2

## 2019-11-25 MED ORDER — LORAZEPAM 2 MG/ML IJ SOLN
0.5000 mg | Freq: Once | INTRAMUSCULAR | Status: AC
  Administered 2019-11-25: 0.5 mg via INTRAVENOUS
  Filled 2019-11-25: qty 1

## 2019-11-25 MED ORDER — POTASSIUM CHLORIDE 10 MEQ/100ML IV SOLN
INTRAVENOUS | Status: AC
  Filled 2019-11-25: qty 100

## 2019-11-25 MED ORDER — ENOXAPARIN SODIUM 60 MG/0.6ML ~~LOC~~ SOLN
60.0000 mg | Freq: Two times a day (BID) | SUBCUTANEOUS | Status: DC
  Administered 2019-11-25 – 2019-11-26 (×3): 60 mg via SUBCUTANEOUS
  Filled 2019-11-25 (×3): qty 0.6

## 2019-11-25 MED ORDER — POTASSIUM CHLORIDE CRYS ER 20 MEQ PO TBCR
40.0000 meq | EXTENDED_RELEASE_TABLET | ORAL | Status: DC
  Filled 2019-11-25: qty 2

## 2019-11-25 NOTE — Progress Notes (Signed)
[place] for Lovenox Indication: atrial fibrillation  Allergies  Allergen Reactions  . Penicillins Hives, Swelling and Rash    Has patient had a PCN reaction causing immediate rash, facial/tongue/throat swelling, SOB or lightheadedness with hypotension: Yes Has patient had a PCN reaction causing severe rash involving mucus membranes or skin necrosis: No Has patient had a PCN reaction that required hospitalization: No Has patient had a PCN reaction occurring within the last 10 years: No If all of the above answers are "NO", then may proceed with Cephalosporin use.    Patient Measurements: Height: 5\' 2"  (157.5 cm) Weight: 130 lb 4.7 oz (59.1 kg) IBW/kg (Calculated) : 50.1 Heparin Dosing Weight: 59.1  Vital Signs: Temp: 98.2 F (36.8 C) (12/26 0223) Temp Source: Oral (12/26 0223) BP: 129/64 (12/26 0223) Pulse Rate: 88 (12/26 0433)  Labs: Recent Labs    10/31/2019 1302 11/19/2019 1700 11/25/2019 2141 11/25/19 0018  HGB 12.7  --  11.9* 12.2  HCT 37.7  --  35.0* 36.0  PLT 581*  --   --  538*  HEPARINUNFRC  --   --   --  0.43  CREATININE 0.54  --   --  0.45  TROPONINIHS 11 14  --   --    Estimated Creatinine Clearance: 47.3 mL/min (by C-G formula based on SCr of 0.45 mg/dL).  Medical History: Past Medical History:  Diagnosis Date  . Anxiety   . Arthritis    a. 10/2012 s/p Right THA.  Marland Kitchen Arthritis    "hands" (09/21/2017)  . Chronic lower back pain   . Complication of anesthesia    "couldn't wake me up after my hip surgery" (09/21/2017)  . Depression   . History of ARDS   . Hyperlipidemia   . Hypertension   . Lung cancer ([place]) 02/16/13   LUL lung SQUAMOUS CELL  . Lung cancer ([place]) dx'd 12/2013    LLL ADENOCARCINOMA  . Pulmonary fibrosis ([place]) dx'd ~ 07/2017  . Pulmonary nodule    a. 10/2012 CT: 41mm anterior LUL nodule w/ spiculated appearance - rec PET CT.  . Seasonal allergies    "I take Claritin" (09/21/2017)  . Sinus headache      Medications:  Scheduled:  . amLODipine  10 mg Oral Daily  . atorvastatin  20 mg Oral q morning - 10a  . loratadine  10 mg Oral Daily  . metoprolol tartrate  12.5 mg Oral BID  . Nintedanib  100 mg Oral BID    Assessment: Patient is a 62 yof that presented to the ED with shortness of breath. Patient was just discharged after being admitted for left hydropneumothorax. Patient was in Afib with RVR and looking at last admission EKG patient was in [facility] then. Pharmacy has been asked to dose heparin at this time for Afib.   Began Heparin bolus 3500 units, infusion at 900 units/hr 12/25 pm 12/26 am: change to Lovenox  Goal of Therapy:  Heparin level 0.3-0.7 units/ml Monitor platelets by anticoagulation protocol: Yes   Plan:  Discontinue Heparin infusion Begin Lovenox 60mg  (1mg /kg) q12 one hr after Heparin infusion stopped Can consider using 1.5mg /kg q24 (90mg ) for chronic treatment Afib with NSCLC Monitor patient for s/s of bleeding   Minda Ditto PharmD  11/25/2019,6:27 AM

## 2019-11-25 NOTE — Progress Notes (Signed)
ANTICOAGULATION CONSULT NOTE - Follow Up Consult  Pharmacy Consult for heparin Indication: atrial fibrillation  Labs: Recent Labs    11/18/2019 1302 11/30/2019 1700 11/29/2019 2141 11/25/19 0018  HGB 12.7  --  11.9* 12.2  HCT 37.7  --  35.0* 36.0  PLT 581*  --   --  538*  HEPARINUNFRC  --   --   --  0.43  CREATININE 0.54  --   --  0.45  TROPONINIHS 11 14  --   --     Assessment/Plan:  78yo female therapeutic on heparin with initial dosing for Afib. Will continue gtt at current rate and confirm stable with additional level.   Wynona Neat, PharmD, BCPS  11/25/2019,1:06 AM

## 2019-11-25 NOTE — Progress Notes (Signed)
PROGRESS NOTE    [name] [name]  DGL:[id number] DOB: 03-30-1941 DOA: 11/07/2019 PCP: Alroy Dust, L.Marlou Sa, MD    Brief Narrative:  78 year old female who presented with dyspnea.  She does have significant past medical history for hypertension, dyslipidemia, tobacco abuse, left upper lobe squamous cell lung carcinoma s/p SBRT 2014, left lower lobe adenocarcinoma status post  SBRT 2015, idiopathic pulmonary fibrosis, and chronic hypoxic respiratory failure.   Patient had recent hospitalization 12/17 to 12/22, for left hydropneumothorax, patient underwent thoracentesis and she was diagnosed with new left malignant pleural effusion, non-small cell lung cancer.  She was discharged with plan to follow-up with oncology, pulmonology and IR for supraclavicular lymph node biopsy.  Her last chest radiograph from December 21 showed loculated pleural effusion in the left lateral chest and apex.   At home patient developed sudden worsening dyspnea associated with palpitations that prompted her to come back to the hospital for further evaluation.  On her initial physical examination she was in atrial fibrillation with rapid ventricular response heart rate 134, she was hypoxic 89% on supplemental oxygen, 91 to 94% on Bipap, blood pressure 169/99, respiratory rate 28.  Positive JVD, lungs with poor aeration, diffuse rales and rhonchi bilaterally, heart S1-S2 present irregularly irregular, the abdomen was soft, no lower extremity edema. Sodium 130, potassium 4.3, chloride 88, bicarb 32, glucose 133, BUN 8, creatinine 0.54, white count 21, hemoglobin 12.7, hematocrit 37.7 platelets 581.  SARS COVID-19 negative.  Chest radiograph, stable density at the base of the left upper lobe, bilateral interstitial infiltrates at bases, bilateral small pleural effusions.  EKG 128 bpm, normal axis, right bundle branch block, atrial fibrillation rhythm, no ST segment or T wave changes.  Patient was admitted to the hospital with  working diagnosis of acute on chronic hypoxic respiratory failure, complicated by atrial fibrillation with rapid ventricular response.   Assessment & Plan:   Principal Problem:   Acute respiratory failure with hypoxia (HCC) Active Problems:   Pulmonary fibrosis (HCC)   Hypertension   Idiopathic pulmonary fibrosis (H[facility])   Atrial fibrillation with RVR (HCC)   1. Acute on chronic hypoxic respiratory failure due to cardiogenic pulmonary edema, in the setting of left chronic loculated pleural effusion, non small cell lung cancer and IPF. Chest film personally reviewed noted worsening interstitial infiltrates and small pleural effusion, suggestive of pulmonary edema, likely triggered by atrial fibrillation with rapid ventricular response.   Bipap settings: 10/8, Fi02 90% generating Vt 470 ml.  Will diurese with furosemide 40 mg IV once, to target negative fluid balance. Target oxygen saturation more than 88%. Follow strict in and out.   WBC is elevated up to 21, possible bacterial pneumonia will continue antibiotic therapy with Cefepime. Intention of rapid de-escalation. Follow on cell count, cultures and temperature curve. Will hold on Vancomycin for now, low pre-test probability for MRSA pneumonia.   If no response to diuresis will plan for CT chest, to further evaluate pulmonary parenchyma (IPF flare) and to rule out pulmonary embolism. Continue with Nintedanib.   2. New onset atrial fibrillation with acute diastolic heart failure. Echocardiogram 12/20 with preserved LV systolic dysfunction Ef 60 to 65% (personally reviewed old records). Patient had one dose of IV diltiazem on admission, HR continue uncontrolled 110 to 120, will increase metoprolol to 50 mg po bid. CHADS vasc 3, will use enoxaparin in exchange of heparin drip to limit IV fluids.   3. Hypokalemia. Serum K at 3,4 with preserved renal function, serum cr at 0,45. Will continue K correction  with Kcl 40 meq x 2 and follow renal panel  in am, including Mg.   4. Non small cell lung cancer. Likely metastatic, PET scan with abnormal enhancement at T3, T4, T5. T6. Pending left supraclavicular lymph node biopsy.  5. HTN. Hold on amlodipine for now.    DVT prophylaxis: enoxaparin   Code Status:  DNR  Family Communication: no family at the bedside  Disposition Plan/ discharge barriers:  Pending clinical improvement,   Patient is critically ill on non invasive mechanical ventilation, will continue supportive medical therapy to prevent imminent deterioration.   Critical care time 60 minutes.   Consultants:     Procedures:     Antimicrobials:   Cefepime     Subjective: Patient continue to have dyspnea and palpitations, moderate in intensity, no nausea or vomiting, no chest pain. On Bipap full face mask.   Objective: Vitals:   11/25/19 0433 11/25/19 0723 11/25/19 0800 11/25/19 0813  BP:  (!) 143/69 (!) 176/82 (!) 176/82  Pulse: 88 (!) 112  (!) 111  Resp: (!) 25 (!) 25 (!) 21 (!) 26  Temp:  (!) 97.1 F (36.2 C)    TempSrc:  Axillary    SpO2: 96% 93% 91% 94%  Weight:      Height:        Intake/Output Summary (Last 24 hours) at 11/25/2019 0818 Last data filed at 11/25/2019 0800 Gross per 24 hour  Intake 676.07 ml  Output --  Net 676.07 ml   Filed Weights   11/25/19 0223  Weight: 59.1 kg    Examination:   General: deconditioned and ill looking appearing  Neurology: Awake and alert, non focal  E ENT: positive pallor, no icterus, oral mucosa moist/ positive accessory muscle use.  Cardiovascular: positive JVD. S1-S2 present, irregularly irregular, no gallops, rubs, or murmurs. No lower extremity edema. Pulmonary: positive breath sounds bilaterally, decreased air movement, no wheezing, positive bilateral rhonchi and rales. Gastrointestinal. Abdomen with no organomegaly, non tender, no rebound or guarding Skin. No rashes Musculoskeletal: no joint deformities     Data Reviewed: I have personally  reviewed following labs and imaging studies  CBC: Recent Labs  Lab 11/21/19 0215 11/23/2019 1302 11/19/2019 2141 11/25/19 0018  WBC 11.4* 21.0*  --  21.1*  HGB 12.1 12.7 11.9* 12.2  HCT 35.5* 37.7 35.0* 36.0  MCV 93.9 95.2  --  95.2  PLT 400 581*  --  154*   Basic Metabolic Panel: Recent Labs  Lab 11/19/19 0428 11/20/19 0251 11/21/19 0215 11/25/2019 1302 11/22/2019 2141 11/25/19 0018  NA 128* 128* 129* 130* 126* 131*  K 3.5 3.8 3.8 4.3 3.4* 3.4*  CL 87* 87* 85* 88*  --  87*  CO2 31 32 33* 32  --  31  GLUCOSE 107* 100* 106* 133*  --  115*  BUN 7* 5* 5* 8  --  8  CREATININE 0.48 0.49 0.53 0.54  --  0.45  CALCIUM 9.0 9.0 9.4 9.8  --  9.4   GFR: Estimated Creatinine Clearance: 47.3 mL/min (by C-G formula based on SCr of 0.45 mg/dL). Liver Function Tests: No results for input(s): AST, ALT, ALKPHOS, BILITOT, PROT, ALBUMIN in the last 168 hours. No results for input(s): LIPASE, AMYLASE in the last 168 hours. No results for input(s): AMMONIA in the last 168 hours. Coagulation Profile: No results for input(s): INR, PROTIME in the last 168 hours. Cardiac Enzymes: No results for input(s): CKTOTAL, CKMB, CKMBINDEX, TROPONINI in the last 168 hours. BNP (last 3  results) No results for input(s): PROBNP in the last 8760 hours. HbA1C: No results for input(s): HGBA1C in the last 72 hours. CBG: No results for input(s): GLUCAP in the last 168 hours. Lipid Profile: No results for input(s): CHOL, HDL, LDLCALC, TRIG, CHOLHDL, LDLDIRECT in the last 72 hours. Thyroid Function Tests: No results for input(s): TSH, T4TOTAL, FREET4, T3FREE, THYROIDAB in the last 72 hours. Anemia Panel: No results for input(s): VITAMINB12, FOLATE, FERRITIN, TIBC, IRON, RETICCTPCT in the last 72 hours.    Radiology Studies: I have reviewed all of the imaging during this hospital visit personally     Scheduled Meds: . amLODipine  10 mg Oral Daily  . atorvastatin  20 mg Oral q morning - 10a  . loratadine   10 mg Oral Daily  . metoprolol tartrate  12.5 mg Oral BID  . Nintedanib  100 mg Oral BID   Continuous Infusions: . ceFEPime (MAXIPIME) IV 2 g (11/25/19 0532)  . heparin 900 Units/hr (11/21/2019 2330)  . vancomycin       LOS: 1 day        Kodie Pick Gerome Apley, MD

## 2019-11-25 NOTE — Progress Notes (Signed)
I spoke with patient's son about her condition,and  poor prognosis in the setting of recurrent lung cancer.  The patient and her family are aware of the poor prognosis, they would like to give priority to quality of life.  I will consult the palliative care team.  For now we will continue current medical therapy.  The patient's family would like to come and visit her on a daily basis, since it is possible that she may not survive this hospitalization

## 2019-11-26 ENCOUNTER — Encounter: Payer: Self-pay | Admitting: Internal Medicine

## 2019-11-26 ENCOUNTER — Encounter: Payer: Self-pay | Admitting: Radiation Oncology

## 2019-11-26 ENCOUNTER — Inpatient Hospital Stay (HOSPITAL_COMMUNITY): Payer: Medicare Other

## 2019-11-26 ENCOUNTER — Encounter: Payer: Self-pay | Admitting: *Deleted

## 2019-11-26 DIAGNOSIS — C3492 Malignant neoplasm of unspecified part of left bronchus or lung: Secondary | ICD-10-CM

## 2019-11-26 DIAGNOSIS — Z515 Encounter for palliative care: Secondary | ICD-10-CM

## 2019-11-26 DIAGNOSIS — Z66 Do not resuscitate: Secondary | ICD-10-CM | POA: Diagnosis present

## 2019-11-26 LAB — BASIC METABOLIC PANEL
Anion gap: 13 (ref 5–15)
BUN: 14 mg/dL (ref 8–23)
CO2: 36 mmol/L — ABNORMAL HIGH (ref 22–32)
Calcium: 10.1 mg/dL (ref 8.9–10.3)
Chloride: 92 mmol/L — ABNORMAL LOW (ref 98–111)
Creatinine, Ser: 0.57 mg/dL (ref 0.44–1.00)
GFR calc Af Amer: >60 mL/min (ref >60)
GFR calc non Af Amer: >60 mL/min (ref >60)
Glucose, Bld: 106 mg/dL — ABNORMAL HIGH (ref 70–99)
Potassium: 4.3 mmol/L (ref 3.5–5.1)
Sodium: 141 mmol/L (ref 135–145)

## 2019-11-26 LAB — CBC WITH DIFFERENTIAL/PLATELET
Abs Immature Granulocytes: 0.11 10*3/uL — ABNORMAL HIGH (ref 0.00–0.07)
Basophils Absolute: 0 10*3/uL (ref 0.0–0.1)
Basophils Relative: 0 %
Eosinophils Absolute: 0.5 10*3/uL (ref 0.0–0.5)
Eosinophils Relative: 2 %
HCT: 37.4 % (ref 36.0–46.0)
Hemoglobin: 12.4 g/dL (ref 12.0–15.0)
Immature Granulocytes: 1 %
Lymphocytes Relative: 3 %
Lymphs Abs: 0.5 10*3/uL — ABNORMAL LOW (ref 0.7–4.0)
MCH: 32.4 pg (ref 26.0–34.0)
MCHC: 33.2 g/dL (ref 30.0–36.0)
MCV: 97.7 fL (ref 80.0–100.0)
Monocytes Absolute: 1 10*3/uL (ref 0.1–1.0)
Monocytes Relative: 5 %
Neutro Abs: 17.2 10*3/uL — ABNORMAL HIGH (ref 1.7–7.7)
Neutrophils Relative %: 89 %
Platelets: 502 10*3/uL — ABNORMAL HIGH (ref 150–400)
RBC: 3.83 MIL/uL — ABNORMAL LOW (ref 3.87–5.11)
RDW: 13 % (ref 11.5–15.5)
WBC: 19.3 10*3/uL — ABNORMAL HIGH (ref 4.0–10.5)
nRBC: 0 % (ref 0.0–0.2)

## 2019-11-26 LAB — GLUCOSE, CAPILLARY: Glucose-Capillary: 105 mg/dL — ABNORMAL HIGH (ref 70–99)

## 2019-11-26 LAB — BRAIN NATRIURETIC PEPTIDE: B Natriuretic Peptide: 190.1 pg/mL — ABNORMAL HIGH (ref 0.0–100.0)

## 2019-11-26 MED ORDER — MORPHINE SULFATE (PF) 2 MG/ML IV SOLN
INTRAVENOUS | Status: AC
  Filled 2019-11-26: qty 1

## 2019-11-26 MED ORDER — MORPHINE SULFATE (PF) 2 MG/ML IV SOLN
2.0000 mg | INTRAVENOUS | Status: DC
  Administered 2019-11-26: 2 mg via INTRAVENOUS
  Filled 2019-11-26 (×2): qty 1

## 2019-11-26 MED ORDER — POLYVINYL ALCOHOL 1.4 % OP SOLN
1.0000 [drp] | Freq: Four times a day (QID) | OPHTHALMIC | Status: DC | PRN
  Filled 2019-11-26: qty 15

## 2019-11-26 MED ORDER — DEXTROSE 5 % IV SOLN: INTRAVENOUS | Status: DC

## 2019-11-26 MED ORDER — GLYCOPYRROLATE 1 MG PO TABS: 1.0000 mg | ORAL_TABLET | ORAL | Status: DC | PRN

## 2019-11-26 MED ORDER — DEXTROSE-NACL 5-0.45 % IV SOLN: INTRAVENOUS | Status: DC

## 2019-11-26 MED ORDER — LORAZEPAM 2 MG/ML IJ SOLN
0.2500 mg | Freq: Four times a day (QID) | INTRAMUSCULAR | Status: DC | PRN
  Administered 2019-11-26: 0.25 mg via INTRAVENOUS
  Filled 2019-11-26: qty 1

## 2019-11-26 MED ORDER — ONDANSETRON 4 MG PO TBDP: 4.0000 mg | ORAL_TABLET | Freq: Four times a day (QID) | ORAL | Status: DC | PRN

## 2019-11-26 MED ORDER — ENOXAPARIN SODIUM 40 MG/0.4ML ~~LOC~~ SOLN: 40.0000 mg | Freq: Two times a day (BID) | SUBCUTANEOUS | Status: DC

## 2019-11-26 MED ORDER — BIOTENE DRY MOUTH MT LIQD: 15.0000 mL | OROMUCOSAL | Status: DC | PRN

## 2019-11-26 MED ORDER — MORPHINE SULFATE (PF) 2 MG/ML IV SOLN
1.0000 mg | INTRAVENOUS | Status: DC | PRN
  Filled 2019-11-26: qty 1

## 2019-11-26 MED ORDER — GLYCOPYRROLATE 0.2 MG/ML IJ SOLN: 0.2000 mg | INTRAMUSCULAR | Status: DC | PRN

## 2019-11-26 MED ORDER — MORPHINE BOLUS VIA INFUSION
2.0000 mg | INTRAVENOUS | Status: DC | PRN
  Filled 2019-11-26: qty 2

## 2019-11-26 MED ORDER — NINTEDANIB ESYLATE 100 MG PO CAPS: 100.0000 mg | ORAL_CAPSULE | Freq: Two times a day (BID) | ORAL | Status: DC

## 2019-11-26 MED ORDER — LORAZEPAM 2 MG/ML IJ SOLN
0.2500 mg | Freq: Four times a day (QID) | INTRAMUSCULAR | Status: DC | PRN
  Administered 2019-11-27: 0.25 mg via INTRAVENOUS
  Filled 2019-11-26: qty 1

## 2019-11-26 MED ORDER — MORPHINE SULFATE (PF) 2 MG/ML IV SOLN
1.0000 mg | INTRAVENOUS | Status: DC | PRN
  Administered 2019-11-27: 2 mg via INTRAVENOUS
  Filled 2019-11-26: qty 1

## 2019-11-26 MED ORDER — FUROSEMIDE 10 MG/ML IJ SOLN: 40.0000 mg | Freq: Every day | INTRAMUSCULAR | Status: DC

## 2019-11-26 MED ORDER — ENOXAPARIN SODIUM 60 MG/0.6ML ~~LOC~~ SOLN
50.0000 mg | Freq: Two times a day (BID) | SUBCUTANEOUS | Status: DC
  Administered 2019-11-26: 50 mg via SUBCUTANEOUS
  Filled 2019-11-26 (×2): qty 0.6

## 2019-11-26 MED ORDER — MORPHINE 100MG IN NS 100ML (1MG/ML) PREMIX INFUSION
1.0000 mg/h | INTRAVENOUS | Status: DC
  Filled 2019-11-26: qty 100

## 2019-11-26 MED ORDER — MORPHINE SULFATE (PF) 2 MG/ML IV SOLN
2.0000 mg | Freq: Four times a day (QID) | INTRAVENOUS | Status: DC
  Administered 2019-11-26 – 2019-11-27 (×3): 2 mg via INTRAVENOUS
  Filled 2019-11-26 (×3): qty 1

## 2019-11-26 MED ORDER — LORAZEPAM 2 MG/ML IJ SOLN: 1.0000 mg | Freq: Four times a day (QID) | INTRAMUSCULAR | Status: DC | PRN

## 2019-11-26 MED ORDER — LABETALOL HCL 5 MG/ML IV SOLN: 5.0000 mg | INTRAVENOUS | Status: DC | PRN

## 2019-11-26 MED ORDER — HALOPERIDOL LACTATE 5 MG/ML IJ SOLN: 0.5000 mg | INTRAMUSCULAR | Status: DC | PRN

## 2019-11-26 MED ORDER — LORAZEPAM 2 MG/ML IJ SOLN
0.2500 mg | Freq: Four times a day (QID) | INTRAMUSCULAR | Status: DC
  Administered 2019-11-26: 0.25 mg via INTRAVENOUS
  Filled 2019-11-26: qty 1

## 2019-11-26 MED ORDER — HALOPERIDOL LACTATE 2 MG/ML PO CONC
0.5000 mg | ORAL | Status: DC | PRN
  Filled 2019-11-26: qty 0.3

## 2019-11-26 MED ORDER — FUROSEMIDE 10 MG/ML IJ SOLN
40.0000 mg | Freq: Two times a day (BID) | INTRAMUSCULAR | Status: DC
  Administered 2019-11-26: 40 mg via INTRAVENOUS
  Filled 2019-11-26: qty 4

## 2019-11-26 MED ORDER — HALOPERIDOL 0.5 MG PO TABS: 0.5000 mg | ORAL_TABLET | ORAL | Status: DC | PRN

## 2019-11-26 MED ORDER — METOPROLOL TARTRATE 5 MG/5ML IV SOLN
5.0000 mg | Freq: Four times a day (QID) | INTRAVENOUS | Status: DC
  Administered 2019-11-26 – 2019-11-27 (×6): 5 mg via INTRAVENOUS
  Filled 2019-11-26 (×6): qty 5

## 2019-11-26 MED ORDER — ONDANSETRON HCL 4 MG/2ML IJ SOLN: 4.0000 mg | Freq: Four times a day (QID) | INTRAMUSCULAR | Status: DC | PRN

## 2019-11-26 MED ORDER — FUROSEMIDE 10 MG/ML IJ SOLN
40.0000 mg | Freq: Two times a day (BID) | INTRAMUSCULAR | Status: DC
  Administered 2019-11-26 – 2019-11-27 (×2): 40 mg via INTRAVENOUS
  Filled 2019-11-26 (×2): qty 4

## 2019-11-26 NOTE — Progress Notes (Signed)
Shift event: Several hours ago, RN paged because pt's O2 sat on Bipap would fall less than 88%. Asked RN to call RT and see if settings needed to be changed. Settings were changed. Spoke to RN after that, stating that pt is a DNR and bipap is our last choice for respiratory care. Asked RN to call NP back should she begin to decrease her O2 sats further and not pop back up to 88% or better. RN called NP back at 0120 to say pt is now satting in the low 80s and below and this is sustaining. NP reviewed chart. Attending spoke to son 12/26 and informed him of pt's poor prognosis in the setting of recurrent lung cancer. Note says palliative care will be consulted today. In note about talk with son, there is reference to the pt "may not survive hospitalization".  NP attempted to call son x 2 phones. There was no answer. Then, called pt's daughter and got voice mail.  KJKG, NP [facility]

## 2019-11-26 NOTE — Progress Notes (Signed)
Sent note to provider concerning the patients oxygenation level and if there are any changes that can or should be made at this time. The Bipap is at the maximum setting.

## 2019-11-26 NOTE — Progress Notes (Signed)
Progress Note    Anaisabel Pederson  JOA:[id number] DOB: 1941-04-16  DOA: 11/30/2019 PCP: Alroy Dust, L.Marlou Sa, MD    Brief Narrative:     Medical records reviewed and are as summarized below:  Celisa Schoenberg is an 78 y.o. 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 She has had to increase her baseline 2L up to 4-5L without relieve.  No fever. No cough, runny nose. No chest pain.   She was just discharged on 12/22 after being admitted from 12/17 for hydropneumothorax and underwent thoracentesis with exudative fluid. Cytology was positive for non-small cell lung cancer. There was concerns of metastatic disease seen on PET scan and MRI thoracic spine with abnormal enhancement at T3,T4,T5, and T6. MRI brain negative. Pulmonology was consulted. She has outpatient following up with oncology and lymph node biopsy with IR for more sample for EGFR markers.   Assessment/Plan:   Principal Problem:   Acute respiratory failure with hypoxia (HCC) Active Problems:   Pulmonary fibrosis (HCC)   Hypertension   Idiopathic pulmonary fibrosis (HCC)   Atrial fibrillation with RVR (HCC)   DNR (do not resuscitate)     Acute on chronic hypoxic respiratory failure due to cardiogenic pulmonary edema, in the setting of left chronic loculated pleural effusion, non small cell lung cancer and IPF. -worsening interstitial infiltrates and small pleural effusion, suggestive of pulmonary edema, likely triggered by atrial fibrillation with rapid ventricular response.  -Bipap  -wears 2L at baseline -diurese with furosemide  IV - strict I/Os -WBC is elevated up to 21,  possible bacterial pneumonia will continue antibiotic therapy with Cefepime.   New onset atrial fibrillation (persistant)  -Patient had one dose of IV diltiazem on admission -HR continue uncontrolled 110 to 120, -IV lopressor -CHADS vasc 3, will use enoxaparin as blood thinner  Hypokalemia.  -repleted   Non small cell lung cancer.  -Likely metastatic, PET scan with abnormal enhancement at T3, T4, T5. T6.  -pending outpatient left supraclavicular lymph node biopsy.  HTN.  -Hold home medications for now    Overall poor prognosis.  Appreciated palliative care consult- family not ready for comfort care yet but will treat pain and anxiety and re-check this PM   Family Communication/Anticipated D/C date and plan/Code Status   DVT prophylaxis: Lovenox ordered. Code Status: dnr Family Communication: son/daughter at bedside Disposition Plan: pending improvement vs decline   Medical Consultants:    Palliative care     Subjective:   Currently relying on bipap for O2 -overnight had a worsening of her respiratory status  Family concerned about her not eating  Objective:    Vitals:   11/26/19 1000 11/26/19 1015 11/26/19 1030 11/26/19 1229  BP:    124/70  Pulse: (!) 122 (!) 119 (!) 118 (!) 111  Resp: (!) 33 (!) 36 (!) 33 (!) 24  Temp:    98.4 F (36.9 C)  TempSrc:    Axillary  SpO2: (!) 88% (!) 88% (!) 87% (!) 89%  Weight:      Height:        Intake/Output Summary (Last 24 hours) at 11/26/2019 1327 Last data filed at 11/26/2019 1004 Gross per 24 hour  Intake 200 ml  Output 930 ml  Net -730 ml   Filed Weights   11/25/19 0223 11/26/19 0401  Weight: 59.1 kg 51.7 kg    Exam: In bed, on bipap Awake to light touch Appears chronically ill irr No rashes or lesions   Data Reviewed:   I have personally reviewed following labs and imaging studies:  Labs: Labs show the following:   Basic Metabolic Panel: Recent Labs  Lab 11/21/19 0215 11/29/2019 1302  11/29/2019 2141 11/25/19 0018 11/25/19 2105 11/26/19 0845  NA 129* 130* 126* 131* 136 141  K 3.8 4.3 3.4* 3.4* 5.1 4.3  CL 85* 88*  --  87* 91* 92*  CO2 33* 32  --  31 34* 36*  GLUCOSE 106* 133*  --  115* 88 106*  BUN 5* 8  --  '8 12 14  ' CREATININE 0.53 0.54  --  0.45 0.59 0.57  CALCIUM 9.4 9.8  --  9.4 9.6 10.1  MG  --   --   --   --  1.8  --    GFR Estimated Creatinine Clearance: 47.3 mL/min (by C-G formula based on SCr of 0.57 mg/dL). Liver Function Tests: No results for input(s): AST, ALT, ALKPHOS, BILITOT, PROT, ALBUMIN in the last 168 hours. No results for input(s): LIPASE, AMYLASE in the last 168 hours. No results for input(s): AMMONIA in the last 168 hours. Coagulation profile No results for input(s): INR, PROTIME in the last 168 hours.  CBC: Recent Labs  Lab 11/21/19 0215 11/20/2019 1302 11/07/2019 2141 11/25/19 0018 11/26/19 0235  WBC 11.4* 21.0*  --  21.1* 19.3*  NEUTROABS  --   --   --   --  17.2*  HGB 12.1 12.7 11.9* 12.2 12.4  HCT 35.5* 37.7 35.0* 36.0 37.4  MCV 93.9 95.2  --  95.2 97.7  PLT 400 581*  --  538* 502*   Cardiac Enzymes: No results for input(s): CKTOTAL, CKMB, CKMBINDEX, TROPONINI in the last 168 hours. BNP (last 3 results) No results for input(s): PROBNP in the last 8760 hours. CBG: Recent Labs  Lab 11/26/19 0912  GLUCAP 105*   D-Dimer: No results for input(s): DDIMER in the last 72 hours. Hgb A1c: No results for input(s): HGBA1C in the last 72 hours. Lipid Profile: No results for input(s): CHOL, HDL, LDLCALC, TRIG, CHOLHDL, LDLDIRECT in the last 72 hours. Thyroid function studies: No results for input(s): TSH, T4TOTAL, T3FREE, THYROIDAB in the last 72 hours.  Invalid input(s): FREET3 Anemia work up: No results for input(s): VITAMINB12, FOLATE, FERRITIN, TIBC, IRON, RETICCTPCT in the last 72 hours. Sepsis Labs: Recent Labs  Lab 11/21/19 0215 11/02/2019 1302 11/23/2019 1700 11/25/19 0018 11/26/19 0235  PROCALCITON  --   --  0.17  --    --   WBC 11.4* 21.0*  --  21.1* 19.3*    Microbiology Recent Results (from the past 240 hour(s))  SARS CORONAVIRUS 2 (TAT 6-24 HRS) Nasopharyngeal Nasopharyngeal Swab     Status: None   Collection Time: 11/16/19 10:08 PM   Specimen: Nasopharyngeal Swab  Result Value Ref Range Status   SARS Coronavirus 2 NEGATIVE NEGATIVE Final    Comment: (NOTE) SARS-CoV-2 target nucleic acids are NOT DETECTED. The SARS-CoV-2 RNA is generally detectable in upper and lower respiratory specimens during the acute phase of infection. Negative results do not preclude SARS-CoV-2 infection, do not rule out co-infections with other pathogens, and should not be used as the sole basis for treatment or other patient management decisions. Negative results must be combined with clinical  observations, patient history, and epidemiological information. The expected result is Negative. Fact Sheet for Patients: SugarRoll.be Fact Sheet for Healthcare Providers: https://www.woods-mathews.com/ This test is not yet approved or cleared by the [place] FDA and  has been authorized for detection and/or diagnosis of SARS-CoV-2 by FDA under an Emergency Use Authorization (EUA). This EUA will remain  in effect (meaning this test can be used) for the duration of the COVID-19 declaration under Section 56 4(b)(1) of the Act, 21 U.S.C. section 360bbb-3(b)(1), unless the authorization is terminated or revoked sooner. Performed at [facility], [place] [address] [place], [place] [address]   Body fluid culture     Status: None   Collection Time: 11/17/19  9:36 AM   Specimen: Lung, Left; Pleural Fluid  Result Value Ref Range Status   Specimen Description PLEURAL LEFT  Final   Special Requests NONE  Final   Gram Stain   Final    RARE WBC PRESENT, PREDOMINANTLY MONONUCLEAR NO ORGANISMS SEEN    Culture   Final    NO GROWTH 3 DAYS Performed at [facility], [place]  [address], [place], [place] [address]    Report Status 11/20/2019 FINAL  Final  MRSA PCR Screening     Status: None   Collection Time: 11/17/19  6:02 PM   Specimen: Nasopharyngeal  Result Value Ref Range Status   MRSA by PCR NEGATIVE NEGATIVE Final    Comment:        The GeneXpert MRSA Assay (FDA approved for NASAL specimens only), is one component of a comprehensive MRSA colonization surveillance program. It is not intended to diagnose MRSA infection nor to guide or monitor treatment for MRSA infections. Performed at [facility], [place] [address] [place], [place] [address]   Respiratory Panel by RT PCR (Flu A&B, Covid) - Nasopharyngeal Swab     Status: None   Collection Time: 11/28/2019  2:45 PM   Specimen: Nasopharyngeal Swab  Result Value Ref Range Status   SARS Coronavirus 2 by RT PCR NEGATIVE NEGATIVE Final    Comment: (NOTE) SARS-CoV-2 target nucleic acids are NOT DETECTED. The SARS-CoV-2 RNA is generally detectable in upper respiratoy specimens during the acute phase of infection. The lowest concentration of SARS-CoV-2 viral copies this assay can detect is 131 copies/mL. A negative result does not preclude SARS-Cov-2 infection and should not be used as the sole basis for treatment or other patient management decisions. A negative result may occur with  improper specimen collection/handling, submission of specimen other than nasopharyngeal swab, presence of viral mutation(s) within the areas targeted by this assay, and inadequate number of viral copies (<131 copies/mL). A negative result must be combined with clinical observations, patient history, and epidemiological information. The expected result is Negative. Fact Sheet for Patients:  PinkCheek.be Fact Sheet for Healthcare Providers:  GravelBags.it This test is not yet ap proved or cleared by the [place] FDA and  has been authorized for detection and/or  diagnosis of SARS-CoV-2 by FDA under an Emergency Use Authorization (EUA). This EUA will remain  in effect (meaning this test can be used) for the duration of the COVID-19 declaration under Section 564(b)(1) of the Act, 21 U.S.C. section 360bbb-3(b)(1), unless the authorization is terminated or revoked sooner.    Influenza A by PCR NEGATIVE NEGATIVE Final   Influenza B by PCR NEGATIVE NEGATIVE Final    Comment: (NOTE) The Xpert Xpress SARS-CoV-2/FLU/RSV assay is intended as an aid in  the diagnosis of influenza  from Nasopharyngeal swab specimens and  should not be used as a sole basis for treatment. Nasal washings and  aspirates are unacceptable for Xpert Xpress SARS-CoV-2/FLU/RSV  testing. Fact Sheet for Patients: PinkCheek.be Fact Sheet for Healthcare Providers: GravelBags.it This test is not yet approved or cleared by the [place] FDA and  has been authorized for detection and/or diagnosis of SARS-CoV-2 by  FDA under an Emergency Use Authorization (EUA). This EUA will remain  in effect (meaning this test can be used) for the duration of the  Covid-19 declaration under Section 564(b)(1) of the Act, 21  U.S.C. section 360bbb-3(b)(1), unless the authorization is  terminated or revoked. Performed at [facility], [place] [address], [place], [place] [address]     Procedures and diagnostic studies:  DG CHEST PORT 1 VIEW  Result Date: 11/26/2019 CLINICAL DATA:  Hypoxia at, history lung cancer, pulmonary fibrosis, hypertension EXAM: PORTABLE CHEST 1 VIEW COMPARISON:  Portable exam 1110 hours compared to 10/31/2019 FINDINGS: Normal heart size. Atherosclerotic calcification aorta. Extensive opacities throughout both lungs likely representing a combination of pulmonary fibrosis and superimposed infiltrate, unchanged. Persistent LEFT pleural effusion. No pneumothorax. Displaced fractures of the LEFT fifth and sixth ribs again  seen. IMPRESSION: No interval change in BILATERAL pulmonary infiltrates superimposed upon a background of pulmonary fibrosis. Persistent LEFT pleural effusion with displaced fractures of the lateral LEFT fifth and sixth ribs. Aortic Atherosclerosis (ICD10-I70.0). Electronically Signed   By: Lavonia Dana M.D.   On: 11/26/2019 11:43   DG Chest Port 1 View  Result Date: 11/11/2019 CLINICAL DATA:  Shortness of breath. Lung carcinoma. EXAM: PORTABLE CHEST 1 VIEW COMPARISON:  11/20/2019, 11/18/2019 and 11/17/2019 and 09/22/2017 FINDINGS: There is progression of the hazy infiltrate in the right mid and lower lung zones superimposed on chronic interstitial lung disease. The left loculated pleural effusion is unchanged. The area of consolidation in the left perihilar region is unchanged. Slight decreased aeration at the left lung base where there is extensive chronic interstitial disease. Heart size and pulmonary vascularity are normal. Aortic atherosclerosis. No acute bone abnormality. No visible residual pneumothorax. IMPRESSION: 1. Progressive infiltrate in the right mid and lower lung zones superimposed on chronic interstitial lung disease. 2. No change in the loculated left pleural effusion. 3. Slight decreased aeration at the left lung base. Electronically Signed   By: Lorriane Shire M.D.   On: 11/10/2019 14:13    Medications:   . enoxaparin (LOVENOX) injection  60 mg Subcutaneous Q12H  . furosemide  40 mg Intravenous Q12H  . loratadine  10 mg Oral Daily  . LORazepam  0.25 mg Intravenous Q6H  . metoprolol tartrate  5 mg Intravenous Q6H  .  morphine injection  2 mg Intravenous Q4H  . Nintedanib  100 mg Oral BID   Continuous Infusions: . ceFEPime (MAXIPIME) IV 2 g (11/26/19 0549)  . dextrose 5 % and 0.45% NaCl 50 mL/hr at 11/26/19 0918     LOS: 2 days   Geradine Girt  [facility]   How to contact the [facility] Attending or Consulting provider [place] or covering provider during after hours  [place], for this patient?  1. Check the care team in [facility] and look for a) attending/consulting TR[facility] provider listed and b) the [facility] team listed 2. Log into [email or web address] and use [place]'s universal password to access. If you do not have the password, please contact the hospital operator. 3. Locate the [facility] provider you are looking for under [facility] and page to a number that you can be directly reached. 4. If you still have difficulty reaching the provider, please page the [facility] (Director on Call) for the Hospitalists listed on amion for assistance.  11/26/2019, 1:27 PM

## 2019-11-26 NOTE — Progress Notes (Signed)
Received notice that patient is in 60s on maximum BiPAP support.   Son expressed a strong desire to be with his mother at end of life.  Son and daughter will come to hospital this evening for a compassionate wean from BiPAP.  Florentina Jenny, PA-C Palliative Medicine Office:  [phone]

## 2019-11-26 NOTE — Consult Note (Signed)
Consultation Note Date: 11/26/2019   Patient Name: [name] [name]  DOB: [birth year]  MRN: [id number]  Age / Sex: 78 y.o., female  PCP: Alroy Dust, L.Marlou Sa, MD Referring Physician: Geradine Girt, DO  Reason for Consultation: Establishing goals of care and Non pain symptom management  HPI/Patient Profile: 79 y.o. female  with past medical history of IPF on 2L at home, squamous cell lung cancer of the LUL in 2014 and adenomatous lung cancer of the LLL in 2015, recent admission for malignant hydropneumothorax who was admitted on 11/21/2019 with worsening shortness of breath requiring BiPAP and afib with RVR.   She was tried on HFNC but desaturated quickly into the 60s.  She currently remains on BiPAP with full support and oxygen saturations in the low 80s.  Clinical Assessment and Goals of Care:  I have reviewed medical records including EPIC notes, labs and imaging, received report from the bedside RN, assessed the patient and then met at the bedside along with her son Inocente Salles and daughter Liala to discuss diagnosis prognosis, [place], EOL wishes, disposition and options.  Sam and Leonor are well informed and understanding of their mother's condition.  They expressed frustration that their mother is anxious and uncomfortable.  Sam had spoke with Dr. Cathlean Sauer yesterday and they agreed that the next 24-48 hours would be crucial and that it is possible she would not survive this hospitalization.  The family accepted this information and wanted to ensure their mother's comfort during the 24 - 48 hour period.  They felt she was not receiving the morphine as she needed it.  We discussed the balance between opioids/benzodiazepines and respiratory drive.  Sam's preference is that his mother be more comfortable at this point.  Lowering her anxiety will allow the BiPAP to work better.  Further if she does not improve she will be relaxed  and in a more appropriate condition to shift to comfort measures.  We discussed a brief life review of the patient.  She was a Radio broadcast assistant, very organized.  She lived independently at [employer] and is a spiritual person.  She has two supportive adult children.  As far as functional and nutritional status, her daughter states that just two weeks ago she was in the kitchen preparing banana pudding for a family celebration.  She has declined quickly.   Primary Decision Maker:  HCPOA Sam (son)    SUMMARY OF RECOMMENDATIONS    PMT will re-round again today to check symptom management.  Code Status/Advance Care Planning:  DNR   Symptom Management:   Ativan for anxiety PRN  Morphine for air hunger scheduled and PRN  Additional Recommendations (Limitations, Scope, Preferences):  Full Scope Treatment  Palliative Prophylaxis:   Frequent Pain Assessment  Psycho-social/Spiritual:   Desire for further Chaplaincy support:  Not discussed.  Prognosis:  Very concerned for a prognosis of hours to days.  Further If her respiratory status does improve temporarily her malignant pleural effusion will simply cause her to decline once again.   Discharge Planning: To Be Determined.  Concerned for Hospital death.      Primary Diagnoses: Present on Admission: . Acute respiratory failure with hypoxia ([place]) . Pulmonary fibrosis ([place]) . Hypertension . Idiopathic pulmonary fibrosis ([place]) . DNR (do not resuscitate)   I have reviewed the medical record, interviewed the patient and family, and examined the patient. The following aspects are pertinent.  Past Medical History:  Diagnosis Date  . Anxiety   . Arthritis    a. 10/2012 s/p Right THA.  [place] Kitchen Arthritis    "hands" (09/21/2017)  . Chronic lower back pain   . Complication of anesthesia    "couldn't wake me up after my hip surgery" (09/21/2017)  . Depression   . History of ARDS   . Hyperlipidemia   . Hypertension   . Lung cancer  ([place]) 02/16/13   LUL lung SQUAMOUS CELL  . Lung cancer ([place]) dx'd 12/2013    LLL ADENOCARCINOMA  . Pulmonary fibrosis ([place]) dx'd ~ 07/2017  . Pulmonary nodule    a. 10/2012 CT: 44m anterior LUL nodule w/ spiculated appearance - rec PET CT.  . Seasonal allergies    "I take Claritin" (09/21/2017)  . Sinus headache    Social History   Socioeconomic History  . Marital status: Divorced    Spouse name: Not on file  . Number of children: 2  . Years of education: Not on file  . Highest education level: Not on file  Occupational History  . Not on file  Tobacco Use  . Smoking status: Former Smoker    Packs/day: 1.00    Years: 40.00    Pack years: 40.00    Types: Cigarettes    Quit date: 11/03/2012    Years since quitting: 7.0  . Smokeless tobacco: Never Used  Substance and Sexual Activity  . Alcohol use: Yes    Alcohol/week: 17.0 standard drinks    Types: 17 Shots of liquor per week    Comment: 09/21/2017 "2-3 shots of liquor /night"  . Drug use: No  . Sexual activity: Never  Other Topics Concern  . Not on file  Social History Narrative   [facility] (09/22/17):   Previously worked as a pRadio broadcast assistant No mold or asbestos exposure. Does have remote bird exposure as a child. Currently lives in a home with 16 steps and her bedroom is on the top floor. Only has a half bath on the first floor.   Social Determinants of Health   Financial Resource Strain:   . Difficulty of Paying Living Expenses: Not on file  Food Insecurity:   . Worried About RCharity fundraiserin the Last Year: Not on file  . Ran Out of Food in the Last Year: Not on file  Transportation Needs:   . Lack of Transportation (Medical): Not on file  . Lack of Transportation (Non-Medical): Not on file  Physical Activity:   . Days of Exercise per Week: Not on file  . Minutes of Exercise per Session: Not on file  Stress:   . Feeling of Stress : Not on file  Social Connections:   . Frequency of Communication with  Friends and Family: Not on file  . Frequency of Social Gatherings with Friends and Family: Not on file  . Attends Religious Services: Not on file  . Active Member of Clubs or Organizations: Not on file  . Attends CArchivistMeetings: Not on file  . Marital Status: Not on file   Family History  Problem Relation Age of Onset  .  CAD Father   . Lung disease Neg Hx   . Rheumatologic disease Neg Hx    Scheduled Meds: . enoxaparin (LOVENOX) injection  60 mg Subcutaneous Q12H  . furosemide  40 mg Intravenous Q12H  . loratadine  10 mg Oral Daily  . metoprolol tartrate  5 mg Intravenous Q6H  .  morphine injection  2 mg Intravenous Q6H  . Nintedanib  100 mg Oral BID   Continuous Infusions: . ceFEPime (MAXIPIME) IV 2 g (11/26/19 0549)  . dextrose 5 % and 0.45% NaCl 25 mL/hr at 11/26/19 1349   PRN Meds:.acetaminophen, labetalol, LORazepam, morphine injection Allergies  Allergen Reactions  . Penicillins Hives, Swelling and Rash    Has patient had a PCN reaction causing immediate rash, facial/tongue/throat swelling, SOB or lightheadedness with hypotension: Yes Has patient had a PCN reaction causing severe rash involving mucus membranes or skin necrosis: No Has patient had a PCN reaction that required hospitalization: No Has patient had a PCN reaction occurring within the last 10 years: No If all of the above answers are "NO", then may proceed with Cephalosporin use.    Review of Systems on BiPAP.   Physical Exam  Pleasant thin frail female, appears anxious Alert orientated, on BiPAP and very difficult to understand.  Writing messages CV tachycardic and irregular Resp on BiPAP, severely decreased breath sounds Abdomen thin, NT, ND   Vital Signs: BP 124/70 (BP Location: Right Arm)   Pulse (!) 111   Temp 98.4 F (36.9 C) (Axillary)   Resp (!) 24   Ht '5\' 2"'  (1.575 m)   Wt 51.7 kg   SpO2 (!) 89%   BMI 20.85 kg/m  Pain Scale: Faces   Pain Score: 0-No pain   SpO2:  SpO2: (!) 89 % O2 Device:SpO2: (!) 89 % O2 Flow Rate: .O2 Flow Rate (L/min): 15 L/min  IO: Intake/output summary:   Intake/Output Summary (Last 24 hours) at 11/26/2019 1557 Last data filed at 11/26/2019 1004 Gross per 24 hour  Intake 200 ml  Output 930 ml  Net -730 ml    LBM:   Baseline Weight: Weight: 59.1 kg Most recent weight: Weight: 51.7 kg     Palliative Assessment/Data: 30%     Time In: 8:30 Time Out: 9:40 Time Total: 70 min. Visit consisted of counseling and education dealing with the complex and emotionally intense issues surrounding the need for palliative care and symptom management in the setting of serious and potentially life-threatening illness. Greater than 50%  of this time was spent counseling and coordinating care related to the above assessment and plan.  Signed by: Florentina Jenny, PA-C Palliative Medicine Pager: [phone]  Please contact Palliative Medicine Team phone at [phone] for questions and concerns.  For individual provider: See Shea Evans

## 2019-11-26 NOTE — Progress Notes (Signed)
Attempted to reach Palliative at [phone], per Amion. No answer. Left a voicemail.

## 2019-11-26 NOTE — Progress Notes (Signed)
Patient status has declined overnight. She is now a red MEWS. She has been unable to maintain her O2 sats above 90% on 100% FiO2 on her BiPAP. She is now becoming confused and saying she thinks her mom is in the room with her. She has been asking for her daughter. Last night, the nurse reported her being alert and oriented and refusing Morphine and Ativan. We have not yet given her any this morning because her son Inocente Salles would like to bring his sister Nirali and speak to the patient before seeking comfort measures. Sam states they have HCPOA and he will want her to have Morphine once he can be here. Spoke with Dr. Grandville Silos about changing her Metoprolol to IV because patient cannot tolerate being off her BiPAP to take POs. See orders. Will continue to monitor.   11/26/19 0745  MEWS Score  Resp (!) 35  ECG Heart Rate (!) 122  BP (!) 158/79  Level of Consciousness New agitation confusion  SpO2 (!) 80 %  O2 Device Bi-PAP  Patient Activity (if Appropriate) In bed  FiO2 (%) 100 %  MEWS Score  MEWS RR 2  MEWS Pulse 2  MEWS Systolic 0  MEWS LOC 1  MEWS Temp 0  MEWS Score 5  MEWS Score Color Red  MEWS Assessment  Is this an acute change? Yes  Provider Notification  Provider Name/Title D. Grandville Silos  Date Provider Notified 11/26/19  Time Provider Notified [phone]  Notification Type Page  Notification Reason Change in status

## 2019-11-26 NOTE — Progress Notes (Addendum)
Spoke with patient's children, Sam and [place]. They requested we hold Morphine earlier, but now have decided they want patient to have it with Ativan. They wish to watch patient's progress for the next few hours and check in with palliative at 5pm. They are considering comfort measures, but are not yet sure they are ready. Will continue to monitor.   11/26/19 1600  MEWS Score  Resp (!) 33  ECG Heart Rate (!) 112  Pulse Rate (!) 121  BP (!) 144/77  Level of Consciousness Responds to Voice  SpO2 (!) 82 %  O2 Device Bi-PAP  FiO2 (%) 100 %  MEWS Score  MEWS RR 2  MEWS Pulse 2  MEWS Systolic 0  MEWS LOC 1  MEWS Temp 0  MEWS Score 5  MEWS Score Color Red  MEWS Assessment  Is this an acute change? No

## 2019-11-26 NOTE — Progress Notes (Signed)
Foley catheter ordered, but not placed. Patient resting comfortably after first dose of Morphine and Ativan and her children do not want her disturbed. Second scheduled dose of Morphine refused.

## 2019-11-26 NOTE — Progress Notes (Signed)
Full note to follow.  Visited patient and family at bedside.  78 yo well educated woman who has idiopathic pulm fibrosis and lung cancer - treated with radiation.  2 weeks ago was up making family dinner, but has declined rapidly.  Was discharged from [facility] 12/22 after having a hydropneumothorax with malignant effusion.   Recent PET scan shows metastatic cancer to spine.  She was   Here with severe respiratory failure and afib with RVR.   Respiratory status is poor even on BiPAP.  She is DNR/DNI.  Son and daughter are well informed and do not want their mother to suffer, but would like to give her a chance for recovery. They describe the patient's biggest fear is having doctors do "Just one more thing" as she witness when her own mother was chronically ill and dying of cancer with respiratory failure.   Sam, HCPOA, requests that she be given anti-anxiety and low dose morphine which will hopefully allow the BiPAP to work better and help alleviate his mother's suffering.  Plan:  Will give low dose scheduled ativan to relieve anxiety and morphine to relieve air hunger.  Will check back to reassess the effect of these medications this afternoon and adjust dosing if necessary.  Continue BiPAP and current care otherwise for the next 24-48 hours.  If does not improve family will likely shift to full comfort measures.  [name] Jenny, PA-C Palliative Medicine Office:  [phone]

## 2019-11-26 NOTE — Progress Notes (Addendum)
Vitals after receiving 5mg  IV Labetalol. Patient continues to be anxious and mildly confused. Family are on the way. Will continue to monitor.   11/26/19 0815  MEWS Score  Resp (!) 37  ECG Heart Rate 73  BP (!) 145/72  Level of Consciousness Alert  SpO2 (!) 85 %  O2 Device Bi-PAP  FiO2 (%) 100 %  MEWS Score  MEWS RR 3  MEWS Pulse 0  MEWS Systolic 0  MEWS LOC 0  MEWS Temp 0  MEWS Score 3  MEWS Score Color Yellow

## 2019-11-27 ENCOUNTER — Inpatient Hospital Stay: Payer: Medicare Other

## 2019-11-27 ENCOUNTER — Inpatient Hospital Stay: Payer: Medicare Other | Admitting: Internal Medicine

## 2019-11-27 ENCOUNTER — Inpatient Hospital Stay (HOSPITAL_COMMUNITY): Payer: Medicare Other

## 2019-11-27 LAB — CBC
HCT: 39.8 % (ref 36.0–46.0)
Hemoglobin: 12.1 g/dL (ref 12.0–15.0)
MCH: 31.6 pg (ref 26.0–34.0)
MCHC: 30.4 g/dL (ref 30.0–36.0)
MCV: 103.9 fL — ABNORMAL HIGH (ref 80.0–100.0)
Platelets: 452 10*3/uL — ABNORMAL HIGH (ref 150–400)
RBC: 3.83 MIL/uL — ABNORMAL LOW (ref 3.87–5.11)
RDW: 13.2 % (ref 11.5–15.5)
WBC: 16.8 10*3/uL — ABNORMAL HIGH (ref 4.0–10.5)
nRBC: 0 % (ref 0.0–0.2)

## 2019-11-27 LAB — BASIC METABOLIC PANEL
Anion gap: 14 (ref 5–15)
BUN: 18 mg/dL (ref 8–23)
CO2: 36 mmol/L — ABNORMAL HIGH (ref 22–32)
Calcium: 9.8 mg/dL (ref 8.9–10.3)
Chloride: 91 mmol/L — ABNORMAL LOW (ref 98–111)
Creatinine, Ser: 0.57 mg/dL (ref 0.44–1.00)
GFR calc Af Amer: >60 mL/min (ref >60)
GFR calc non Af Amer: >60 mL/min (ref >60)
Glucose, Bld: 120 mg/dL — ABNORMAL HIGH (ref 70–99)
Potassium: 4.3 mmol/L (ref 3.5–5.1)
Sodium: 141 mmol/L (ref 135–145)

## 2019-11-27 MED ORDER — ONDANSETRON 4 MG PO TBDP: 4.0000 mg | ORAL_TABLET | Freq: Four times a day (QID) | ORAL | Status: DC | PRN

## 2019-11-27 MED ORDER — GLYCOPYRROLATE 0.2 MG/ML IJ SOLN: 0.2000 mg | INTRAMUSCULAR | Status: DC | PRN

## 2019-11-27 MED ORDER — HALOPERIDOL 0.5 MG PO TABS: 0.5000 mg | ORAL_TABLET | ORAL | Status: DC | PRN

## 2019-11-27 MED ORDER — ONDANSETRON HCL 4 MG/2ML IJ SOLN: 4.0000 mg | Freq: Four times a day (QID) | INTRAMUSCULAR | Status: DC | PRN

## 2019-11-27 MED ORDER — GLYCOPYRROLATE 1 MG PO TABS: 1.0000 mg | ORAL_TABLET | ORAL | Status: DC | PRN

## 2019-11-27 MED ORDER — MORPHINE 100MG IN NS 100ML (1MG/ML) PREMIX INFUSION
2.0000 mg/h | INTRAVENOUS | Status: DC
  Administered 2019-11-27: 2 mg/h via INTRAVENOUS
  Filled 2019-11-27: qty 100

## 2019-11-27 MED ORDER — POLYVINYL ALCOHOL 1.4 % OP SOLN
1.0000 [drp] | Freq: Four times a day (QID) | OPHTHALMIC | Status: DC | PRN
  Filled 2019-11-27: qty 15

## 2019-11-27 MED ORDER — SODIUM CHLORIDE 0.9 % IV SOLN
2.0000 g | INTRAVENOUS | Status: DC
  Filled 2019-11-27: qty 20

## 2019-11-27 MED ORDER — SODIUM CHLORIDE 0.9 % IV SOLN
500.0000 mg | INTRAVENOUS | Status: DC
  Filled 2019-11-27: qty 500

## 2019-11-27 MED ORDER — MORPHINE BOLUS VIA INFUSION
2.0000 mg | INTRAVENOUS | Status: DC | PRN
  Administered 2019-11-27 (×3): 2 mg via INTRAVENOUS
  Filled 2019-11-27: qty 2

## 2019-11-27 MED ORDER — HALOPERIDOL LACTATE 5 MG/ML IJ SOLN: 0.5000 mg | INTRAMUSCULAR | Status: DC | PRN

## 2019-11-27 MED ORDER — HALOPERIDOL LACTATE 2 MG/ML PO CONC: 0.5000 mg | ORAL | Status: DC | PRN

## 2019-11-27 MED ORDER — BIOTENE DRY MOUTH MT LIQD: 15.0000 mL | OROMUCOSAL | Status: DC | PRN

## 2019-11-27 MED ORDER — LORAZEPAM 2 MG/ML IJ SOLN
1.0000 mg | INTRAMUSCULAR | Status: DC | PRN
  Administered 2019-11-27: 1 mg via INTRAVENOUS
  Filled 2019-11-27: qty 1

## 2019-11-28 ENCOUNTER — Ambulatory Visit: Payer: Medicare Other

## 2019-11-28 ENCOUNTER — Ambulatory Visit: Payer: Medicare Other | Admitting: Radiation Oncology

## 2019-12-01 NOTE — Progress Notes (Signed)
IV Morphine infusion wasted in [facility]. Waste witnessed by [employer]. Estimated waste amount is 75 mL.

## 2019-12-01 NOTE — Progress Notes (Signed)
Patient removed from [place] with daughter and son present in the room.Pallative PA [place] was present to help with the transition off.

## 2019-12-01 NOTE — Progress Notes (Signed)
Patient assessed this AM. Patient not able to verbalize any needs. Patient lets out a moan when asked orientation questions. She is tolerating Bipap well. Moving upper extremities to pull blankets down. Will give PRN Ativan to help relax the patient. Patient was reposition.

## 2019-12-01 NOTE — Death Summary Note (Signed)
DEATH SUMMARY   Patient Details  Name: [name] [name] MRN: [id number] DOB: Apr 01, 1941  Admission/Discharge Information   Admit Date:  12-20-2019  Date of Death: Date of Death: 12-23-2019  Time of Death: Time of Death: 04-Mar-1750  Length of Stay: 3  Referring Physician: Alroy Dust, L.Marlou Sa, MD   Reason(s) for Hospitalization    Diagnoses  Preliminary cause of death:    Acute respiratory failure with hypoxia ([place])  Secondary Diagnoses (including complications and co-morbidities):   Pulmonary fibrosis ([place])   Hypertension   Idiopathic pulmonary fibrosis ([place])   Atrial fibrillation with RVR ([place])   DNR (do not resuscitate)   Non-small cell cancer of left lung [facility])   Palliative care encounter   Brief Hospital Course (including significant findings, care, treatment, and services provided and events leading to death)   [name] [name] is an 79 y.o. 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 She has had to increase her baseline 2L up to 4-5L without relieve.  No fever. No cough, runny nose. No chest pain.   She was just discharged on 12/22 after being admitted from 12/17 for hydropneumothorax and underwent thoracentesis with exudative fluid. Cytology was positive for non-small cell lung cancer. There was concerns of metastatic disease seen on PET scan and MRI thoracic spine with abnormal enhancement at T3,T4,T5, and T6. MRI brain negative.    Patient continued to worsen overnight despite IV Abx/IV lasix.  PCCM consulted to help family with decision, plan is to transfer to full comfort care will morphine gtt and removal of bipap.   Acute on chronic  hypoxic respiratory failure due to non small cell lung cancer and IPF. --transition to comfort care   New onset atrial fibrillation (persistant)  -transition to comfort care  Hypokalemia.  -repleted   Non small cell lung cancer.  -Likely metastatic, PET scan with abnormal enhancement at T3, T4, T5. T6.  -pending outpatient left supraclavicular lymph node biopsy. -transition to comfort care  HTN.  -comfort focus     Pertinent Labs and Studies  Significant Diagnostic Studies DG Chest 1 View  Result Date: 11/17/2019 CLINICAL DATA:  Status post thoracentesis on the left EXAM: CHEST  1 VIEW COMPARISON:  November 17, 2019 radiograph obtained earlier in the day; PET-CT November 16, 2019 FINDINGS: There is a stable small left apical pneumothorax without tension component. Left pleural effusion is smaller following thoracentesis. There is underlying airspace consolidation in the left upper and mid lung regions. There is underlying fibrosis. No new opacity is evident. Heart is upper normal in size with pulmonary vascularity within normal limits. There is aortic atherosclerosis. No appreciable adenopathy evident by radiography. Known bony metastases are not well seen by radiography. IMPRESSION: Stable left apical pneumothorax without appreciable tension component. Left pleural effusion smaller following thoracentesis. Airspace opacity in the left upper to mid lung region remains. Underlying fibrosis. No new opacity. Stable cardiac silhouette. Aortic Atherosclerosis (ICD10-I70.0). Electronically Signed   By: Lowella Grip III M.D.   On: 11/17/2019 09:22   DG Chest 2 View  Result Date: 11/20/2019 CLINICAL DATA:  Lung carcinoma with hypoxia EXAM: CHEST - 2 VIEW COMPARISON:  November 18, 2019 FINDINGS: There is loculated effusion on the left tracking into the left  apex. The pneumothorax seen in this area 1 day prior is not evident on this study. There is fibrosis throughout both lungs. There  is consolidation in the left mid lung region, stable, with potential underlying mass. No new opacity is evident in the lung parenchyma. Heart is upper normal in size with pulmonary vascularity normal. No adenopathy is evident by radiography. There is aortic atherosclerosis. Several rib fractures are noted on the left, stable. IMPRESSION: Loculated pleural effusion in the left lateral chest and apex region. The pneumothorax in this area seen 1 day prior is not appreciable, although fluid could be present superimposed on pneumothorax in this area. Consolidation with questionable underlying mass in the left mid lung region is stable. There is underlying fibrosis. No new parenchymal lung opacity. Stable cardiac silhouette.  Aortic Atherosclerosis (ICD10-I70.0). Electronically Signed   By: Lowella Grip III M.D.   On: 11/20/2019 07:36   MR BRAIN W WO CONTRAST  Result Date: 11/21/2019 CLINICAL DATA:  Lung cancer staging. EXAM: MRI HEAD WITHOUT AND WITH CONTRAST TECHNIQUE: Multiplanar, multiecho pulse sequences of the brain and surrounding structures were obtained without and with intravenous contrast. CONTRAST:  43mL GADAVIST GADOBUTROL 1 MMOL/ML IV SOLN COMPARISON:  MRI head 02/14/2013 FINDINGS: Brain: Mild atrophy. Scattered small white matter hyperintensities similar to the prior study. Negative for acute infarct. Negative for hemorrhage or mass. No enhancing mass lesion in the brain. Vascular: Normal arterial flow voids. Atherosclerotic calcification and ectasia of the terminal left internal carotid artery is unchanged. Skull and upper cervical spine: Stable lesion anterior frontal bone on the left with fatty signal on T1. Probable hemangioma. No evidence of skeletal metastatic disease. Sinuses/Orbits: Paranasal sinuses show mild mucosal edema. Bilateral cataract surgery. Other: None IMPRESSION: Negative for metastatic disease to the brain Mild chronic microvascular ischemic change in the white matter.  Electronically Signed   By: Franchot Gallo M.D.   On: 11/21/2019 14:44   MR THORACIC SPINE W WO CONTRAST  Result Date: 11/17/2019 CLINICAL DATA:  Lung cancer. Abnormal uptake into the thoracic spine on the PET scan. EXAM: MRI THORACIC WITHOUT AND WITH CONTRAST TECHNIQUE: Multiplanar and multiecho pulse sequences of the thoracic spine were obtained without and with intravenous contrast. CONTRAST:  5.49mL GADAVIST GADOBUTROL 1 MMOL/ML IV SOLN COMPARISON:  PET scan 11/16/2019. CT of the chest 12/06/2017 and 12/07/2018 FINDINGS: MRI THORACIC SPINE FINDINGS Alignment: Slight degenerative retrolisthesis is present at T11-12 and T12-L1. No other significant listhesis is present. Levoconvex curvature is centered at T7-8. Vertebrae: Abnormal signal and enhancement is present at T3, T4, T5, and T6, consistent with metastatic disease to the spine. Enhancement occupies the posterior 2/3 of the vertebral body at T3 without significant extension in the pedicles. There is diffuse enhancement in the posterior 1/3 of the vertebral body at T4 without abnormal signal and enhancement extending into the left pedicle. More heterogeneous posterior enhancement is present at T5 and T6 with abnormal signal and enhancement extending into the left pedicle of both levels. No pathologic fractures are present. No definite extraosseous tumor or enhancement is present. No other focal areas of enhancement are present within the thoracic or visualized upper lumbar spine. The scout image demonstrates signal change at C6 and possibly C5, is consistent with additional metastases. There is no definite enhancement in this region. This likely represents more typical degenerative change. Cord:  Normal signal is present in the thoracic spinal cord. Paraspinal and other soft tissues: Bilateral pleural effusions are present. Post obstructive disease is present in the left  lung. Bilateral airspace opacities are noted. Complex cysts are again noted in the  left kidney. Upper abdomen is otherwise unremarkable. Disc levels: T1-2: A broad-based disc protrusion is asymmetric to the right. Moderate right and mild left foraminal narrowing is present. T2-3: Broad-based disc bulging and facet disease contributes to mild foraminal narrowing bilaterally, right greater than left. T3-4: Facet spurring contributes to mild right foraminal narrowing. T4-5: No significant disc disease. Facet disease is worse on the right. T5-6: No significant disc disease. Facet hypertrophy is worse on the left. T6-7: A prominent right paramedian disc extrusion is present. Disc material extends 9 mm above the inferior endplate of T6. There is contact and slight distortion of the ventral surface the cord without abnormal signal. T7-8: A shallow central disc protrusion is present. No significant stenosis is present. T8-9: A left paramedian disc protrusion is present. There is effacement of ventral CSF without significant stenosis. T9-10: A far right lateral disc protrusion extends into the foramen with moderate right and mild left foraminal narrowing. T10-11: Facet spurring contributes to mild right foraminal narrowing. T11-12: A broad-based disc protrusion is present without significant stenosis. T12-L1: A broad-based disc protrusion is present. Mild foraminal narrowing is noted bilaterally. IMPRESSION: 1. Abnormal signal and enhancement at T3, T4, T5, and T6 consistent with metastatic disease to the spine. 2. No pathologic fractures are present. 3. Multilevel spondylosis of the lumbar spine as described. 4. Bilateral lung disease and effusions, left greater than right. Please see PET scan from yesterday for further detail. 5. Complex left renal cysts are stable. Electronically Signed   By: San Morelle M.D.   On: 11/17/2019 05:43   NM PET Image Restag (PS) Skull Base To Thigh  Result Date: 11/16/2019 CLINICAL DATA:  Subsequent treatment strategy for lung cancer. EXAM: NUCLEAR MEDICINE  PET SKULL BASE TO THIGH TECHNIQUE: 5.7 mCi F-18 FDG was injected intravenously. Full-ring PET imaging was performed from the skull base to thigh after the radiotracer. CT data was obtained and used for attenuation correction and anatomic localization. Fasting blood glucose: 128 mg/dl COMPARISON:  05/24/2017 FINDINGS: Mediastinal blood pool activity: SUV max 2.4 Liver activity: SUV max NA NECK: No hypermetabolic lymph nodes in the neck. Incidental CT findings: none CHEST: Multiple areas of hypermetabolism are identified in the anterior left lung including a 3.3 x 2.1 cm consolidative opacity in the medial left upper lung adjacent to the mediastinum (see image 53/4) with SUV max = 5.6. Consolidative opacity seen relatively diffusely in the left parahilar lung shows hypermetabolic FDG uptake with SUV max = 5.6. Focal uptake in the inferior left hilum demonstrates SUV max = 5.1. Focal uptake in the region of the right atrium noted without underlying lesion evident on this noncontrast CT. There is some soft tissue attenuation anterior to the ascending aorta that stable since prior study. Bandlike opacity anterior right upper lobe (image 52/series 4) shows low level hypermetabolism with SUV max = 3.9. Focal hypermetabolism noted in the right hilum without lymphadenopathy evident by CT. Similar focal hypermetabolism identified inferior right hilum. There is a moderate left pleural fluid collection with gas identified in the left anterior pleural space. Low level diffuse FDG accumulation is identified along the left pleural surface. 9 mm short axis left supraclavicular node (34/4) demonstrates SUV max = 3.9. Focus of hypermetabolism identified in the left T11 paraspinal tissues without underlying mass lesion evident by CT. SUV max = 4.4. Incidental CT findings: There is abdominal aortic atherosclerosis without aneurysm. Acute to subacute displaced fractures  noted left lateral fifth rib and posterolateral left sixth rib.  Extensive chronic interstitial/fibrotic lung disease noted bilaterally. ABDOMEN/PELVIS: No abnormal hypermetabolic activity within the liver, pancreas, adrenal glands, or spleen. No hypermetabolic lymph nodes in the abdomen or pelvis. Incidental CT findings: There is abdominal aortic atherosclerosis without aneurysm. Layering tiny gallstones evident. Multiple left renal cysts of varying size and attenuation some with layering calcific debris or hemorrhage. SKELETON: Hypermetabolism is identified in the posterior aspect of the T3 and T4 vertebral bodies although no underlying bony lesion is evident. SUV max = 8.4 at the level of T3. Incidental CT findings: Diffuse degenerative changes noted in the spine. Acute to subacute left rib fractures as above. Patient is status post right hip replacement. IMPRESSION: 1. Multiple areas of low level hypermetabolism associated with consolidative opacity in the left upper and lower lung with hypermetabolic left supraclavicular lymph node, focal hypermetabolism in the hilar regions and anterior mediastinum. Imaging features could be related to diffuse neoplasm (adenocarcinoma) with metastatic disease. Infectious/inflammatory etiology not excluded. 2. Moderate to large left hydropneumothorax, likely posttraumatic as there are acute to subacute displaced fractures of the left fourth and fifth ribs. 3. Bandlike opacity in the anterior right lung with low level hypermetabolism, indeterminate. 4. Hypermetabolic focus in the left 11 paraspinal tissues without underlying mass lesion evident by CT. 5. Hypermetabolic activity in the posterior T3 and T4 vertebral bodies without associated mass lesion evident by CT. Thoracic spine MRI with and without contrast could be used to further evaluate as clinically warranted. 6. Advanced chronic interstitial lung disease with fibrotic component. 7.  Aortic Atherosclerois (ICD10-170.0) These results will be called to the ordering clinician or  representative by the Radiologist Assistant, and communication documented in the PACS or zVision Dashboard. Electronically Signed   By: Misty Stanley M.D.   On: 11/16/2019 15:09   DG CHEST PORT 1 VIEW  Result Date: 11-29-19 CLINICAL DATA:  Acute respiratory failure, shortness of breath EXAM: PORTABLE CHEST 1 VIEW COMPARISON:  11/26/2019 FINDINGS: Severe diffuse bilateral airspace disease. Cardiomegaly. Findings similar to prior study. Multiple left rib fractures again noted, unchanged. No visible pneumothorax. Left effusion is stable. IMPRESSION: Severe diffuse bilateral airspace disease, unchanged. Left pleural effusion with multiple left rib fractures again noted, unchanged. Electronically Signed   By: Rolm Baptise M.D.   On: 2019/11/29 09:51   DG CHEST PORT 1 VIEW  Result Date: 11/26/2019 CLINICAL DATA:  Hypoxia at, history lung cancer, pulmonary fibrosis, hypertension EXAM: PORTABLE CHEST 1 VIEW COMPARISON:  Portable exam 1110 hours compared to 11/04/2019 FINDINGS: Normal heart size. Atherosclerotic calcification aorta. Extensive opacities throughout both lungs likely representing a combination of pulmonary fibrosis and superimposed infiltrate, unchanged. Persistent LEFT pleural effusion. No pneumothorax. Displaced fractures of the LEFT fifth and sixth ribs again seen. IMPRESSION: No interval change in BILATERAL pulmonary infiltrates superimposed upon a background of pulmonary fibrosis. Persistent LEFT pleural effusion with displaced fractures of the lateral LEFT fifth and sixth ribs. Aortic Atherosclerosis (ICD10-I70.0). Electronically Signed   By: Lavonia Dana M.D.   On: 11/26/2019 11:43   DG Chest Port 1 View  Result Date: 10/31/2019 CLINICAL DATA:  Shortness of breath. Lung carcinoma. EXAM: PORTABLE CHEST 1 VIEW COMPARISON:  11/20/2019, 11/18/2019 and 11/17/2019 and 09/22/2017 FINDINGS: There is progression of the hazy infiltrate in the right mid and lower lung zones superimposed on chronic  interstitial lung disease. The left loculated pleural effusion is unchanged. The area of consolidation in the left perihilar region is unchanged. Slight decreased aeration  at the left lung base where there is extensive chronic interstitial disease. Heart size and pulmonary vascularity are normal. Aortic atherosclerosis. No acute bone abnormality. No visible residual pneumothorax. IMPRESSION: 1. Progressive infiltrate in the right mid and lower lung zones superimposed on chronic interstitial lung disease. 2. No change in the loculated left pleural effusion. 3. Slight decreased aeration at the left lung base. Electronically Signed   By: Lorriane Shire M.D.   On: 11/23/2019 14:13   DG CHEST PORT 1 VIEW  Result Date: 11/18/2019 CLINICAL DATA:  Hydropneumothorax. EXAM: PORTABLE CHEST 1 VIEW COMPARISON:  Chest x-ray from yesterday. FINDINGS: Stable cardiomediastinal silhouette. Unchanged small left apical pneumothorax. Unchanged pleural thickening/fluid along the left lateral chest wall adjacent to displaced fifth and sixth rib fractures. Advanced chronic interstitial lung disease again noted. Unchanged consolidation in the left mid lung. No acute osseous abnormality. IMPRESSION: 1. Unchanged small left hydropneumothorax and consolidation in the left mid lung. Electronically Signed   By: Titus Dubin M.D.   On: 11/18/2019 09:37   DG CHEST PORT 1 VIEW  Result Date: 11/17/2019 CLINICAL DATA:  History of broken ribs and pneumothorax. Pleural effusion. EXAM: PORTABLE CHEST 1 VIEW COMPARISON:  PET CT 11/16/2019.  Chest x-ray 09/22/2017. FINDINGS: Mediastinum stable. Stable cardiomegaly. Dense left mid lung infiltrate/mass noted. Chronic interstitial prominence noted. Small left pleural effusion noted. Tiny left apical pneumothorax cannot be excluded. This appears decreased in size from prior PET-CT of 11/16/2019. Left posterior fifth and sixth rib fractures again noted. IMPRESSION: 1. Dense left mid lung field  infiltrate/mass noted. Reference is made to recent PET-CT report of 11/16/2019. Chronic interstitial prominence noted. Small left pleural effusion noted. 2. Tiny residual left apical pneumothorax cannot be excluded. This appears decreased in size from prior PET-CT of 11/16/2019. Left posterior fifth and sixth rib fractures again noted. Electronically Signed   By: Marcello Moores  Register   On: 11/17/2019 07:43   ECHOCARDIOGRAM COMPLETE  Result Date: 11/17/2019   ECHOCARDIOGRAM REPORT   Patient Name:   Rachell Laris Date of Exam: 11/17/2019 Medical Rec #:  [id number]       Height:       62.0 in Accession #:    [id number]      Weight:       135.0 lb Date of Birth:  02-16-1941       BSA:          1.62 m Patient Age:    3 years        BP:           109/66 mmHg Patient Gender: F               HR:           73 bpm. Exam Location:  Inpatient Procedure: Limited Echo and Limited Color Doppler Indications:    CHF-Acute Diastolic 631.49 / F02.63  History:        Patient has prior history of Echocardiogram examinations, most                 recent 11/16/2019. Risk Factors:Hypertension and Dyslipidemia.                 Lung cancer, pulmonary fibrosis.  Sonographer:    Darlina Sicilian RDCS Referring Phys: [place]  1. Left ventricular ejection fraction, by visual estimation, is 60 to 65%. The left ventricle has normal function. Mildly increased left ventricular posterior wall thickness. There is severely increased left ventricular hypertrophy of the basal  septum.  2. Global right ventricle has normal systolic function.The right ventricular size is normal. No increase in right ventricular wall thickness.  3. Left atrial size was normal.  4. Right atrial size was normal.  5. The mitral valve is normal in structure. No evidence of mitral valve regurgitation. No evidence of mitral stenosis.  6. The tricuspid valve is normal in structure. Tricuspid valve regurgitation is trivial.  7. The aortic valve is normal in  structure. Aortic valve regurgitation is not visualized. No evidence of aortic valve sclerosis or stenosis.  8. The pulmonic valve was normal in structure. Pulmonic valve regurgitation is not visualized.  9. Normal pulmonary artery systolic pressure. 10. The inferior vena cava is normal in size with greater than 50% respiratory variability, suggesting right atrial pressure of 3 mmHg. 11. The left ventricle has no regional wall motion abnormalities. 12. Left ventricular diastolic function could not be evaluated. FINDINGS  Left Ventricle: Left ventricular ejection fraction, by visual estimation, is 60 to 65%. The left ventricle has normal function. The left ventricle has no regional wall motion abnormalities. Mildly increased left ventricular posterior wall thickness. There is severely increased left ventricular hypertrophy. Left ventricular diastolic function could not be evaluated. Indeterminate filling pressures. Right Ventricle: The right ventricular size is normal. No increase in right ventricular wall thickness. Global RV systolic function is has normal systolic function. The tricuspid regurgitant velocity is 2.50 m/s, and with an assumed right atrial pressure  of 3 mmHg, the estimated right ventricular systolic pressure is normal at 28.0 mmHg. Left Atrium: Left atrial size was normal in size. Right Atrium: Right atrial size was normal in size Pericardium: There is no evidence of pericardial effusion. Mitral Valve: The mitral valve is normal in structure. No evidence of mitral valve regurgitation. No evidence of mitral valve stenosis by observation. Tricuspid Valve: The tricuspid valve is normal in structure. Tricuspid valve regurgitation is trivial. Aortic Valve: The aortic valve is normal in structure. Aortic valve regurgitation is not visualized. The aortic valve is structurally normal, with no evidence of sclerosis or stenosis. Pulmonic Valve: The pulmonic valve was normal in structure. Pulmonic valve  regurgitation is not visualized. Pulmonic regurgitation is not visualized. Aorta: The aortic root, ascending aorta and aortic arch are all structurally normal, with no evidence of dilitation or obstruction. Venous: The inferior vena cava is normal in size with greater than 50% respiratory variability, suggesting right atrial pressure of 3 mmHg. IAS/Shunts: No atrial level shunt detected by color flow Doppler. There is no evidence of a patent foramen ovale. No ventricular septal defect is seen or detected. There is no evidence of an atrial septal defect.  LEFT VENTRICLE PLAX 2D LVIDd:         2.90 cm LVIDs:         1.70 cm LV PW:         1.40 cm LV IVS:        1.70 cm LVOT diam:     1.70 cm LV SV:         24 ml LV SV Index:   14.45 LVOT Area:     2.27 cm  LEFT ATRIUM           Index LA diam:      2.60 cm 1.61 cm/m LA Vol (A2C): 34.3 ml 21.21 ml/m LA Vol (A4C): 42.4 ml 26.21 ml/m  TRICUSPID VALVE TR Peak grad:   25.0 mmHg TR Vmax:        250.00 cm/s  SHUNTS Systemic Diam: 1.70 cm  Fransico Him MD Electronically signed by Fransico Him MD Signature Date/Time: 11/17/2019/11:50:50 AM    Final    IR THORACENTESIS ASP PLEURAL SPACE W/IMG GUIDE  Result Date: 11/17/2019 INDICATION: History of lung cancer. Shortness of breath. Left hydropneumothorax seen on recent imaging. Request diagnostic and therapeutic thoracentesis. EXAM: ULTRASOUND GUIDED LEFT THORACENTESIS MEDICATIONS: None. COMPLICATIONS: None immediate. PROCEDURE: An ultrasound guided thoracentesis was thoroughly discussed with the patient and questions answered. The benefits, risks, alternatives and complications were also discussed. The patient understands and wishes to proceed with the procedure. Written consent was obtained. Ultrasound was performed to localize and mark an adequate pocket of fluid in the left chest. The area was then prepped and draped in the normal sterile fashion. 1% Lidocaine was used for local anesthesia. Under ultrasound guidance a  6 Fr Safe-T-Centesis catheter was introduced. Thoracentesis was performed. The catheter was removed and a dressing applied. FINDINGS: A total of approximately 470 mL of serosanguineous fluid was removed. Samples were sent to the laboratory as requested by the clinical team. IMPRESSION: Successful ultrasound guided left thoracentesis yielding 470 mL of pleural fluid. Read by: Ascencion Dike PA-C Electronically Signed   By: Aletta Edouard M.D.   On: 11/17/2019 09:28    Microbiology Recent Results (from the past 240 hour(s))  Respiratory Panel by RT PCR (Flu A&B, Covid) - Nasopharyngeal Swab     Status: None   Collection Time: 11/03/2019  2:45 PM   Specimen: Nasopharyngeal Swab  Result Value Ref Range Status   SARS Coronavirus 2 by RT PCR NEGATIVE NEGATIVE Final    Comment: (NOTE) SARS-CoV-2 target nucleic acids are NOT DETECTED. The SARS-CoV-2 RNA is generally detectable in upper respiratoy specimens during the acute phase of infection. The lowest concentration of SARS-CoV-2 viral copies this assay can detect is 131 copies/mL. A negative result does not preclude SARS-Cov-2 infection and should not be used as the sole basis for treatment or other patient management decisions. A negative result may occur with  improper specimen collection/handling, submission of specimen other than nasopharyngeal swab, presence of viral mutation(s) within the areas targeted by this assay, and inadequate number of viral copies (<131 copies/mL). A negative result must be combined with clinical observations, patient history, and epidemiological information. The expected result is Negative. Fact Sheet for Patients:  PinkCheek.be Fact Sheet for Healthcare Providers:  GravelBags.it This test is not yet ap proved or cleared by the [place] FDA and  has been authorized for detection and/or diagnosis of SARS-CoV-2 by FDA under an Emergency Use Authorization  (EUA). This EUA will remain  in effect (meaning this test can be used) for the duration of the COVID-19 declaration under Section 564(b)(1) of the Act, 21 U.S.C. section 360bbb-3(b)(1), unless the authorization is terminated or revoked sooner.    Influenza A by PCR NEGATIVE NEGATIVE Final   Influenza B by PCR NEGATIVE NEGATIVE Final    Comment: (NOTE) The Xpert Xpress SARS-CoV-2/FLU/RSV assay is intended as an aid in  the diagnosis of influenza from Nasopharyngeal swab specimens and  should not be used as a sole basis for treatment. Nasal washings and  aspirates are unacceptable for Xpert Xpress SARS-CoV-2/FLU/RSV  testing. Fact Sheet for Patients: PinkCheek.be Fact Sheet for Healthcare Providers: GravelBags.it This test is not yet approved or cleared by the [place] FDA and  has been authorized for detection and/or diagnosis of SARS-CoV-2 by  FDA under an Emergency Use Authorization (EUA). This EUA will remain  in effect (  meaning this test can be used) for the duration of the  Covid-19 declaration under Section 564(b)(1) of the Act, 21  U.S.C. section 360bbb-3(b)(1), unless the authorization is  terminated or revoked. Performed at [facility], [place] [address] [place], [place] [address]     Lab Basic Metabolic Panel: Recent Labs  Lab 11/29/2019 1302 11/25/2019 2141 11/25/19 0018 11/25/19 2105 11/26/19 0845 12/03/19 1052  NA 130* 126* 131* 136 141 141  K 4.3 3.4* 3.4* 5.1 4.3 4.3  CL 88*  --  87* 91* 92* 91*  CO2 32  --  31 34* 36* 36*  GLUCOSE 133*  --  115* 88 106* 120*  BUN 8  --  8 12 14 18   CREATININE 0.54  --  0.45 0.59 0.57 0.57  CALCIUM 9.8  --  9.4 9.6 10.1 9.8  MG  --   --   --  1.8  --   --    Liver Function Tests: No results for input(s): AST, ALT, ALKPHOS, BILITOT, PROT, ALBUMIN in the last 168 hours. No results for input(s): LIPASE, AMYLASE in the last 168 hours. No results for input(s):  AMMONIA in the last 168 hours. CBC: Recent Labs  Lab 11/21/19 0215 11/20/2019 1302 11/13/2019 2141 11/25/19 0018 11/26/19 0235 December 03, 2019 1052  WBC 11.4* 21.0*  --  21.1* 19.3* 16.8*  NEUTROABS  --   --   --   --  17.2*  --   HGB 12.1 12.7 11.9* 12.2 12.4 12.1  HCT 35.5* 37.7 35.0* 36.0 37.4 39.8  MCV 93.9 95.2  --  95.2 97.7 103.9*  PLT 400 581*  --  538* 502* 452*   Cardiac Enzymes: No results for input(s): CKTOTAL, CKMB, CKMBINDEX, TROPONINI in the last 168 hours. Sepsis Labs: Recent Labs  Lab 11/01/2019 1302 11/16/2019 1700 11/25/19 0018 11/26/19 0235 12/03/2019 1052  PROCALCITON  --  0.17  --   --   --   WBC 21.0*  --  21.1* 19.3* 16.8*       Azarah Dacy U Shaden Lacher 2019/12/03, 6:48 PM

## 2019-12-01 NOTE — Consult Note (Signed)
NAME:  [name] [name], MRN:  [id number], DOB:  1940/12/14, LOS: 3 ADMISSION DATE:  11/14/2019, CONSULTATION DATE:  12/02/2019 REFERRING MD:  TRH CHIEF COMPLAINT:  Worsening hypoxia and SOB    Brief History   79 yo F patient followed by Dr. Elsworth Soho with newly diagnosed reoccurring metastatic adenocarcinoma and prior history of lung cancer status post SBRT and IPF with chronic hypoxic respiratory failure on 2-4 L O2 admitted 12/25 with worsening shortness of breath and progressive hypoxia requiring BiPAP.  Palliative care following.  PCCM consulted for further recommendations.  History of present illness   79 y.o. 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 She has had to increase her baseline O2 of 2L up to 4-5L without relief.  (Note the patient was admitted 12/17-12/22 with hydropneumothorax.  Patient has an appointment scheduled with radiation oncology for 12/29).  Patient and family are aware of diagnosis and were considering hospice in the outpatient setting should she deteriorate.  They attempted to manage her symptoms at home by increasing oxygen but were unable to do so they brought her to the ER.  Since admission she has required escalation to BiPAP to maintain oxygenation with SPO2 in the 80s and continued shortness of breath.  Palliative care was consulted and following her closely and attempting to manage symptoms.  PCCM consulted for further recommendations.  Past Medical History   Past Medical History:  Diagnosis Date  . Anxiety   . Arthritis    a. 10/2012 s/p Right THA.  Marland Kitchen Arthritis    "hands" (09/21/2017)  .  Chronic lower back pain   . Complication of anesthesia    "couldn't wake me up after my hip surgery" (09/21/2017)  . Depression   . History of ARDS   . Hyperlipidemia   . Hypertension   . Lung cancer ([place]) 02/16/13   LUL lung SQUAMOUS CELL  . Lung cancer ([place]) dx'd 12/2013    LLL ADENOCARCINOMA  . Pulmonary fibrosis ([place]) dx'd ~ 07/2017  . Pulmonary nodule    a. 10/2012 CT: 1mm anterior LUL nodule w/ spiculated appearance - rec PET CT.  . Seasonal allergies    "I take Claritin" (09/21/2017)  . Sinus headache      Significant Hospital Events   12/25 Admitted   Consults:  Palliative care PCCM  Procedures:  NA   Significant Diagnostic Tests:  12/28 CXR reveals severe diffuse bilateral airspace disease, left pleural effusion noted with multiple rib fractures which was seen on prior.  There is no visible pneumothorax.   Micro Data:  SARS Coronavirus 2 negative Influenza A/B negative   Antimicrobials:  Cefepime 12/25>> Vanc 12/25 x 1 dose   Interim history/subjective:  Pt seen and evaluated in 2W09. Resting comfortably on BiPAP 20/10/100%. Daughter and son at bedside.   Objective   Blood pressure (!) 151/78, pulse (!) 122, temperature 99.1 F (37.3 C), temperature source Axillary, resp. rate (!) 23, height 5\' 2"  (1.575 m), weight 51.7 kg, SpO2 100 %.    FiO2 (%):  [100 %] 100 %   Intake/Output Summary (Last 24 hours) at 12/02/2019 1154 Last data filed at 2019-12-02 1040 Gross per 24 hour  Intake 382.69 ml  Output 1100  ml  Net -717.31 ml   Filed Weights   11/25/19 0223 11/26/19 0401  Weight: 59.1 kg 51.7 kg    Examination: General: Thin, elderly, lying in bed with BiPAP, well-developed, well nourished. NAD HENT: Normocephalic, PERRL.  Dry oral mucus membranes Neck: No JVD. Trachea midline. CV: RRR. S1S2. No MRG. +2 distal pulses Lungs: BBS diminished throughout, poor excursion, BiPAP assisted, symmetrical ABD: +BS x4. SNT/ND. No masses, guarding or  rigidity GU: Foley EXT:  No edema Skin: P pale, warm, dry.  Anterior surfaces in tact. No rashes or lesions Neuro: Patient is unresponsive to noxious stimuli   Resolved Hospital Problem list   NA   Assessment & Plan:  This is a 79 year old female with recurrent metastatic adenocarcinoma in the setting of interstitial lung disease with acute on chronic hypoxic respiratory failure and encephalopathy complicated by recurrent pleural effusions.  The patient's respiratory status has worsened overnight and she has not been tolerant to coming off of BiPAP.  In depth discussion with daughter and son at bedside regarding next steps and plan of care.  I advised that we can attempt to wean BiPAP support and monitor patient's tolerance.  I suspect that she will decompensate and become hypoxic with respiratory distress fairly quickly.  Advised that if this is the case then the patient is "declaring herself" we can transition to full comfort measures.  They are clear on their wishes as well as their mother's wishes to not escalate care or pursue invasive mechanical ventilation and that they would want her to focus on transition to comfort care as opposed to any type of therapeutic interventions.  DNR has already been instituted.  In the meantime discontinue enoxaparin as this is invasive and uncomfortable for the patient.  We discussed the possibility though low probability that patient could wean from BiPAP to baseline nasal cannula and that goal would be to discharge her home.  The family is well aware that this will likely not happen but appreciate efforts to wean from BiPAP to assess if this is an option.  It is reasonable to continue antibiotics and Lasix at this time.  The family is in agreement with this as well.  I updated the bedside nurse and respiratory therapist on the plan of care.  They will attempt to coordinate efforts and wean from BiPAP and give additional medication such as morphine to assist with  increased work of breathing should this occur.  Updated palliative care as well.    Labs   CBC: Recent Labs  Lab 11/21/19 0215 11/29/2019 1302 11/26/2019 2141 11/25/19 0018 11/26/19 0235 12/23/2019 1052  WBC 11.4* 21.0*  --  21.1* 19.3* 16.8*  NEUTROABS  --   --   --   --  17.2*  --   HGB 12.1 12.7 11.9* 12.2 12.4 12.1  HCT 35.5* 37.7 35.0* 36.0 37.4 39.8  MCV 93.9 95.2  --  95.2 97.7 103.9*  PLT 400 581*  --  538* 502* 452*    Basic Metabolic Panel: Recent Labs  Lab 11/21/19 0215 11/14/2019 1302 11/04/2019 2141 11/25/19 0018 11/25/19 2105 11/26/19 0845  NA 129* 130* 126* 131* 136 141  K 3.8 4.3 3.4* 3.4* 5.1 4.3  CL 85* 88*  --  87* 91* 92*  CO2 33* 32  --  31 34* 36*  GLUCOSE 106* 133*  --  115* 88 106*  BUN 5* 8  --  8 12 14   CREATININE 0.53 0.54  --  0.45 0.59 0.57  CALCIUM 9.4 9.8  --  9.4 9.6 10.1  MG  --   --   --   --  1.8  --    GFR: Estimated Creatinine Clearance: 47.3 mL/min (by C-G formula based on SCr of 0.57 mg/dL). Recent Labs  Lab 11/23/2019 1302 11/02/2019 1700 11/25/19 0018 11/26/19 0235 12/04/2019 1052  PROCALCITON  --  0.17  --   --   --   WBC 21.0*  --  21.1* 19.3* 16.8*    Liver Function Tests: No results for input(s): AST, ALT, ALKPHOS, BILITOT, PROT, ALBUMIN in the last 168 hours. No results for input(s): LIPASE, AMYLASE in the last 168 hours. No results for input(s): AMMONIA in the last 168 hours.  ABG    Component Value Date/Time   PHART 7.373 11/22/2019 2141   PCO2ART 62.7 (H) 11/02/2019 2141   PO2ART 60.0 (L) 11/11/2019 2141   HCO3 36.5 (H) 11/19/2019 2141   TCO2 38 (H) 11/14/2019 2141   ACIDBASEDEF 6.4 (H) 11/11/2012 1752   O2SAT 89.0 11/08/2019 2141     Coagulation Profile: No results for input(s): INR, PROTIME in the last 168 hours.  Cardiac Enzymes: No results for input(s): CKTOTAL, CKMB, CKMBINDEX, TROPONINI in the last 168 hours.  HbA1C: No results found for: HGBA1C  CBG: Recent Labs  Lab 11/26/19 0912  GLUCAP 105*     Review of Systems:   UTO as pt is unresponsive.   Past Medical History  She,  has a past medical history of Anxiety, Arthritis, Arthritis, Chronic lower back pain, Complication of anesthesia, Depression, History of ARDS, Hyperlipidemia, Hypertension, Lung cancer ([place]) (02/16/13), Lung cancer ([place]) (dx'd 12/2013), Pulmonary fibrosis ([place]) (dx'd ~ 07/2017), Pulmonary nodule, Seasonal allergies, and Sinus headache.   Surgical History    Past Surgical History:  Procedure Laterality Date  . BACK SURGERY    . CATARACT EXTRACTION W/ INTRAOCULAR LENS  IMPLANT, BILATERAL Bilateral   . EYE SURGERY Right ~ 1949   "removed a growth"  . IR THORACENTESIS ASP PLEURAL SPACE W/IMG GUIDE  11/17/2019  . JOINT REPLACEMENT    . LUMBAR [place] SURGERY  08/2016   "realigned discs; done in [place]; @ [facility] [place]"  . LUNG BIOPSY Left 02/16/13   LUL =non small cell ca with assoc/necrosis  . TONSILLECTOMY    . TOTAL HIP ARTHROPLASTY  11/03/2012   Procedure: TOTAL HIP ARTHROPLASTY ANTERIOR APPROACH;  Surgeon: Mcarthur Rossetti, MD;  Location: [facility] ORS;  Service: Orthopedics;  Laterality: Right;  Right Total Hip Arthroplasty, Anterior Approach (C-Arm)  . TUBAL LIGATION       Social History   reports that she quit smoking about 7 years ago. Her smoking use included cigarettes. She has a 40.00 pack-year smoking history. She has never used smokeless tobacco. She reports current alcohol use of about 17.0 standard drinks of alcohol per week. She reports that she does not use drugs.   Family History   Her family history includes CAD in her father. There is no history of Lung disease or Rheumatologic disease.   Allergies Allergies  Allergen Reactions  . Penicillins Hives, Swelling and Rash    Has patient had a PCN reaction causing immediate rash, facial/tongue/throat swelling, SOB or lightheadedness with hypotension: Yes Has patient had a PCN reaction causing severe rash involving mucus membranes or skin necrosis:  No Has patient had a PCN reaction that required hospitalization: No Has patient had a PCN reaction occurring within the last 10 years: No If all of the above answers are "  NO", then may proceed with Cephalosporin use.      Home Medications  Prior to Admission medications   Medication Sig Start Date End Date Taking? Authorizing Provider  acetaminophen (TYLENOL) 500 MG tablet Take 500-1,000 mg by mouth every 6 (six) hours as needed for moderate pain.     [provider]  amLODipine (NORVASC) 10 MG tablet Take 1 tablet (10 mg total) by mouth daily. 06/29/17   Rigoberto Noel, MD  atorvastatin (LIPITOR) 40 MG tablet Take 20 mg by mouth every morning.    [provider]  celecoxib (CELEBREX) 100 MG capsule Take 100 mg by mouth 2 (two) times daily. 10/28/19   [provider]  Hypromellose (ARTIFICIAL TEARS OP) Place 1 drop into both eyes daily.    [provider]  loperamide (IMODIUM) 2 MG capsule Take 2 mg by mouth as needed for diarrhea or loose stools.    [provider]  loratadine (CLARITIN) 10 MG tablet Take 10 mg by mouth daily.    [provider]  OFEV 100 MG CAPS TAKE 1 CAPSULE BY MOUTH TWICE A DAY Patient taking differently: Take 100 mg by mouth 2 (two) times daily.  10/05/19   Rigoberto Noel, MD  traMADol (ULTRAM) 50 MG tablet Take 1 tablet (50 mg total) by mouth every 6 (six) hours as needed for moderate pain or severe pain. 11/21/19   Dessa Phi, DO     I spent 45 minutes in direct patient care including reviewing data,  discussing with other providers, assessment, planning and stabilization and documentation. 75% of this time was spent in counseling and education of family.  Time is exclusive to this patient and does not include procedures.   Francine Graven, MSN, AGACNP  [facility]

## 2019-12-01 NOTE — Telephone Encounter (Signed)
Called patient son back and advised him that they would need to talk with a nurse at the nurses station and request to speak with one of the pulmonary doctors who would be working at the hospital today. I advised him that Dr. Elsworth Soho was not on schedule for today. However, considering the information he provided they would already have one of our doctors helping provide care to answer their questions.  He asked about her future appointments, I let him know that if she is seeing a doctor within the [facility], that office would be able to see that she is in the hospital, so there is no need to worry about calling to cancel her appointments. He voiced understanding. Nothing further needed at this time.

## 2019-12-01 NOTE — Progress Notes (Signed)
Patient's family visited last evening.  Sats on BiPAP appeared stable in the 80s.  They decided against compassionate wean.  Florentina Jenny, PA-C Palliative Medicine Office:  [phone]   No charge note

## 2019-12-01 NOTE — Progress Notes (Signed)
Time of death pronounced at 41 by 2 RN Phillis Haggis and Rhetta Mura. Family offered emotional support.

## 2019-12-01 NOTE — Progress Notes (Signed)
INCREASED o2 TO 100% PER FAMILY REQUEST.

## 2019-12-01 NOTE — Progress Notes (Signed)
Patient's morphine drip started. Morphine line primed to let out air bubbles. Small amount of med wasted less that 68mL wasted in sharps. Witnessed by Rhetta Mura RN.   Family informed about the start of drip. They verbalized understanding.

## 2019-12-01 NOTE — Progress Notes (Signed)
Bladder scan showed significant urinary retention.  Order for foley cath placed.  Florentina Jenny, PA-C Palliative Medicine Office:  [phone]

## 2019-12-01 NOTE — Progress Notes (Signed)
Progress Note    [name] [name]  GEF:[id number] DOB: Feb 26, 1941  DOA: 11/03/2019 PCP: Alroy Dust, L.Marlou Sa, MD    Brief Narrative:     Medical records reviewed and are as summarized below:  [name] [name] is an 79 y.o. 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 She has had to increase her baseline 2L up to 4-5L without relieve.  No fever. No cough, runny nose. No chest pain.   She was just discharged on 12/22 after being admitted from 12/17 for hydropneumothorax and underwent thoracentesis with exudative fluid. Cytology was positive for non-small cell lung cancer. There was concerns of metastatic disease seen on PET scan and MRI thoracic spine with abnormal enhancement at T3,T4,T5, and T6. MRI brain negative. Pulmonology was consulted. She has outpatient following up with oncology and lymph node biopsy with IR for more sample for EGFR markers.   Assessment/Plan:   Principal Problem:   Acute respiratory failure with hypoxia (HCC) Active Problems:   Pulmonary fibrosis (HCC)   Hypertension   Idiopathic pulmonary fibrosis (HCC)   Atrial fibrillation with RVR ([place])   DNR (do not resuscitate)   Non-small cell cancer of left lung [facility])   Palliative care encounter   Patient continued to worsen overnight despite IV Abx/IV lasix.  PCCM consulted to help family with decision, plan is to transfer to full comfort care will morphine gtt and removal of bipap.   Acute on chronic hypoxic respiratory failure due to non small cell lung cancer and IPF. --transition to comfort care   New onset atrial fibrillation (persistant)  -transition to comfort  care  Hypokalemia.  -repleted   Non small cell lung cancer.  -Likely metastatic, PET scan with abnormal enhancement at T3, T4, T5. T6.  -pending outpatient left supraclavicular lymph node biopsy. -transition to comfort care  HTN.  -comfort focus     Family Communication/Anticipated D/C date and plan/Code Status   Code Status: dnr Family Communication: son/daughter at bedside Disposition Plan: comfort care   Medical Consultants:    Palliative care  PCCM     Subjective:   Plan is for transition to comfort care  Objective:    Vitals:   December 17, 2019 0752 Dec 17, 2019 0810 December 17, 2019 1139 December 17, 2019 1400  BP:  127/71 (!) 151/78   Pulse: (!) 117 (!) 111 (!) 122 (!) 113  Resp: (!) 30 (!) 21 (!) 23 (!) 23  Temp:  98.3 F (36.8 C) 99.1 F (37.3 C)   TempSrc:  Axillary Axillary   SpO2: 91% 92% 100% 99%  Weight:      Height:        Intake/Output Summary (Last 24 hours) at 12-17-19 1533 Last data filed at 2019/12/17 1100 Gross per 24 hour  Intake 582.69 ml  Output 1100 ml  Net -517.31 ml   Filed Weights   11/25/19 0223 11/26/19 0401  Weight: 59.1 kg 51.7 kg    Exam: This AM on full support bipap Will withdraw to pain but otherwise unresponsive Diminished breath sounds +BS, soft, NT   Data Reviewed:   I have personally reviewed following labs and imaging studies:  Labs: Labs show the following:   Basic Metabolic Panel: Recent Labs  Lab 11/07/2019 1302 11/28/2019 2141  11/25/19 0018 11/25/19 2105 11/26/19 0845 05-Dec-2019 1052  NA 130* 126* 131* 136 141 141  K 4.3 3.4* 3.4* 5.1 4.3 4.3  CL 88*  --  87* 91* 92* 91*  CO2 32  --  31 34* 36* 36*  GLUCOSE 133*  --  115* 88 106* 120*  BUN 8  --  '8 12 14 18  ' CREATININE 0.54  --  0.45 0.59 0.57 0.57  CALCIUM 9.8  --  9.4 9.6 10.1 9.8  MG  --   --   --  1.8  --   --    GFR Estimated Creatinine Clearance: 47.3 mL/min (by C-G formula based on SCr of 0.57 mg/dL). Liver Function Tests: No results for  input(s): AST, ALT, ALKPHOS, BILITOT, PROT, ALBUMIN in the last 168 hours. No results for input(s): LIPASE, AMYLASE in the last 168 hours. No results for input(s): AMMONIA in the last 168 hours. Coagulation profile No results for input(s): INR, PROTIME in the last 168 hours.  CBC: Recent Labs  Lab 11/21/19 0215 11/05/2019 1302 11/01/2019 2141 11/25/19 0018 11/26/19 0235 December 05, 2019 1052  WBC 11.4* 21.0*  --  21.1* 19.3* 16.8*  NEUTROABS  --   --   --   --  17.2*  --   HGB 12.1 12.7 11.9* 12.2 12.4 12.1  HCT 35.5* 37.7 35.0* 36.0 37.4 39.8  MCV 93.9 95.2  --  95.2 97.7 103.9*  PLT 400 581*  --  538* 502* 452*   Cardiac Enzymes: No results for input(s): CKTOTAL, CKMB, CKMBINDEX, TROPONINI in the last 168 hours. BNP (last 3 results) No results for input(s): PROBNP in the last 8760 hours. CBG: Recent Labs  Lab 11/26/19 0912  GLUCAP 105*   D-Dimer: No results for input(s): DDIMER in the last 72 hours. Hgb A1c: No results for input(s): HGBA1C in the last 72 hours. Lipid Profile: No results for input(s): CHOL, HDL, LDLCALC, TRIG, CHOLHDL, LDLDIRECT in the last 72 hours. Thyroid function studies: No results for input(s): TSH, T4TOTAL, T3FREE, THYROIDAB in the last 72 hours.  Invalid input(s): FREET3 Anemia work up: No results for input(s): VITAMINB12, FOLATE, FERRITIN, TIBC, IRON, RETICCTPCT in the last 72 hours. Sepsis Labs: Recent Labs  Lab 11/18/2019 1302 10/31/2019 1700 11/25/19 0018 11/26/19 0235 12/05/19 1052  PROCALCITON  --  0.17  --   --   --   WBC 21.0*  --  21.1* 19.3* 16.8*    Microbiology Recent Results (from the past 240 hour(s))  MRSA PCR Screening     Status: None   Collection Time: 11/17/19  6:02 PM   Specimen: Nasopharyngeal  Result Value Ref Range Status   MRSA by PCR NEGATIVE NEGATIVE Final    Comment:        The GeneXpert MRSA Assay (FDA approved for NASAL specimens only), is one component of a comprehensive MRSA colonization surveillance program.  It is not intended to diagnose MRSA infection nor to guide or monitor treatment for MRSA infections. Performed at [facility], [place] [address], [place],  [address]   Respiratory Panel by RT PCR (Flu A&B, Covid) - Nasopharyngeal Swab     Status: None   Collection Time: 11/13/2019  2:45 PM   Specimen: Nasopharyngeal Swab  Result Value Ref Range Status   SARS Coronavirus 2 by RT PCR NEGATIVE NEGATIVE Final    Comment: (NOTE) SARS-CoV-2 target nucleic acids are NOT DETECTED. The SARS-CoV-2 RNA is generally detectable in upper respiratoy specimens during the acute phase of  infection. The lowest concentration of SARS-CoV-2 viral copies this assay can detect is 131 copies/mL. A negative result does not preclude SARS-Cov-2 infection and should not be used as the sole basis for treatment or other patient management decisions. A negative result may occur with  improper specimen collection/handling, submission of specimen other than nasopharyngeal swab, presence of viral mutation(s) within the areas targeted by this assay, and inadequate number of viral copies (<131 copies/mL). A negative result must be combined with clinical observations, patient history, and epidemiological information. The expected result is Negative. Fact Sheet for Patients:  PinkCheek.be Fact Sheet for Healthcare Providers:  GravelBags.it This test is not yet ap proved or cleared by the [place] FDA and  has been authorized for detection and/or diagnosis of SARS-CoV-2 by FDA under an Emergency Use Authorization (EUA). This EUA will remain  in effect (meaning this test can be used) for the duration of the COVID-19 declaration under Section 564(b)(1) of the Act, 21 U.S.C. section 360bbb-3(b)(1), unless the authorization is terminated or revoked sooner.    Influenza A by PCR NEGATIVE NEGATIVE Final   Influenza B by PCR NEGATIVE NEGATIVE Final     Comment: (NOTE) The Xpert Xpress SARS-CoV-2/FLU/RSV assay is intended as an aid in  the diagnosis of influenza from Nasopharyngeal swab specimens and  should not be used as a sole basis for treatment. Nasal washings and  aspirates are unacceptable for Xpert Xpress SARS-CoV-2/FLU/RSV  testing. Fact Sheet for Patients: PinkCheek.be Fact Sheet for Healthcare Providers: GravelBags.it This test is not yet approved or cleared by the [place] FDA and  has been authorized for detection and/or diagnosis of SARS-CoV-2 by  FDA under an Emergency Use Authorization (EUA). This EUA will remain  in effect (meaning this test can be used) for the duration of the  Covid-19 declaration under Section 564(b)(1) of the Act, 21  U.S.C. section 360bbb-3(b)(1), unless the authorization is  terminated or revoked. Performed at [facility], [place] [address], [place], [place] [address]     Procedures and diagnostic studies:  DG CHEST PORT 1 VIEW  Result Date: Dec 03, 2019 CLINICAL DATA:  Acute respiratory failure, shortness of breath EXAM: PORTABLE CHEST 1 VIEW COMPARISON:  11/26/2019 FINDINGS: Severe diffuse bilateral airspace disease. Cardiomegaly. Findings similar to prior study. Multiple left rib fractures again noted, unchanged. No visible pneumothorax. Left effusion is stable. IMPRESSION: Severe diffuse bilateral airspace disease, unchanged. Left pleural effusion with multiple left rib fractures again noted, unchanged. Electronically Signed   By: Rolm Baptise M.D.   On: 12-03-19 09:51   DG CHEST PORT 1 VIEW  Result Date: 11/26/2019 CLINICAL DATA:  Hypoxia at, history lung cancer, pulmonary fibrosis, hypertension EXAM: PORTABLE CHEST 1 VIEW COMPARISON:  Portable exam 1110 hours compared to 11/15/2019 FINDINGS: Normal heart size. Atherosclerotic calcification aorta. Extensive opacities throughout both lungs likely representing a combination of  pulmonary fibrosis and superimposed infiltrate, unchanged. Persistent LEFT pleural effusion. No pneumothorax. Displaced fractures of the LEFT fifth and sixth ribs again seen. IMPRESSION: No interval change in BILATERAL pulmonary infiltrates superimposed upon a background of pulmonary fibrosis. Persistent LEFT pleural effusion with displaced fractures of the lateral LEFT fifth and sixth ribs. Aortic Atherosclerosis (ICD10-I70.0). Electronically Signed   By: Lavonia Dana M.D.   On: 11/26/2019 11:43    Medications:    Continuous Infusions: . morphine       LOS: 3 days   Geradine Girt  [facility]   How to contact the [facility] Attending or Consulting  provider [place] or covering provider during after hours [place], for this patient?  1. Check the care team in [facility] and look for a) attending/consulting [facility] provider listed and b) the [facility] team listed 2. Log into [email or web address] and use [place]'s universal password to access. If you do not have the password, please contact the hospital operator. 3. Locate the [facility] provider you are looking for under [facility] and page to a number that you can be directly reached. 4. If you still have difficulty reaching the provider, please page the [facility] (Director on Call) for the Hospitalists listed on amion for assistance.  12/17/2019, 3:33 PM

## 2019-12-01 NOTE — Progress Notes (Signed)
Daily Progress Note   Patient Name: [name] [name]       Date: 12-11-19 DOB: [birth year]  Age: 79 y.o. MRN#: [id number] Attending Physician: Geradine Girt, DO Primary Care Physician: Aurea Graff.Marlou Sa, MD Admit Date: 11/30/2019  Reason for Consultation/Follow-up: Establishing goals of care and Psychosocial/spiritual support  Subjective: Spoke with family several times today.  Patient is non-responsive on BiPAP.  Son and daughter have been at bedside asking very appropriate questions.  Due diligence with done with an additional CXR and labs.  Attending MD and CCM Pulmonology opinions were sought and provided.  At this point the medical team and family agree that unfortunately Mrs. Norsworthy does not have the ability to recover from her acute respiratory failure caused by non small cell lung cancer.  Assessment: Unable to wean from BiPAP.  Currently non-responsive after receiving morphine to relieve anxiety and air hunger.   Patient Profile/HPI:  79 y.o. female  with past medical history of IPF on 2L at home, squamous cell lung cancer of the LUL in 2014 and adenomatous lung cancer of the LLL in 2015, recent admission for malignant hydropneumothorax who was admitted on 11/10/2019 with worsening shortness of breath requiring BiPAP and afib with RVR.   She was tried on HFNC but desaturated quickly into the 60s.  She currently remains on BiPAP with full support and oxygen saturations in the low 80s.(12/27).  CXR 12/28 is unchanged from previous showing severe diffuse bilateral airspace disease.  Length of Stay: 3  Current Medications: Scheduled Meds:    Continuous Infusions: . morphine      PRN Meds: acetaminophen, antiseptic oral rinse, LORazepam, morphine injection, morphine, ondansetron  **OR** ondansetron (ZOFRAN) IV, polyvinyl alcohol  Physical Exam       Thin frail female currently resting on BiPAP CV tachy and irregular Resp significantly decreased lung sounds, no apparent distress Abdomen distended and tender (required foley catheter due to urinary retention)  Vital Signs: BP (!) 151/78 (BP Location: Right Arm)   Pulse (!) 113   Temp 99.1 F (37.3 C) (Axillary)   Resp (!) 23   Ht 5\' 2"  (1.575 m)   Wt 51.7 kg   SpO2 99%   BMI 20.85 kg/m  SpO2: SpO2: 99 % O2 Device: O2 Device: Bi-PAP O2 Flow Rate: O2 Flow Rate (L/min): 15 L/min  Intake/output summary:   Intake/Output Summary (Last 24 hours) at 12-03-19 1535 Last data filed at 12/03/19 1100 Gross per 24 hour  Intake 582.69 ml  Output 1100 ml  Net -517.31 ml   LBM:   Baseline Weight: Weight: 59.1 kg Most recent weight: Weight: 51.7 kg       Palliative Assessment/Data: 10%      Patient Active Problem List   Diagnosis Date Noted  . DNR (do not resuscitate) 11/26/2019  . Non-small cell cancer of left lung ([place])   . Palliative care encounter   . Atrial fibrillation with RVR ([place]) 11/25/2019  . Acute respiratory failure with hypoxia ([place]) 11/15/2019  . Malignant pleural effusion   . Metastatic cancer to bone ([place])   . Hydropneumothorax 11/17/2019  . Dyspnea 11/16/2019  . Chronic respiratory failure with hypoxia ([place]) 11/05/2017  . Idiopathic pulmonary fibrosis ([place]) 09/21/2017  . Hypoxia   . Mediastinal lymphadenopathy 06/29/2017  . 24.8 mm well differentiated adenocarcinoma of the left lower lobe of the lung - clinical stage IA 12/28/2013  . 18 mm squamous cell carcinoma of the left upper lobe of the lung - clinical stage IA 02/16/2013  . Pedal edema 01/02/2013  . Hoarseness, persistent 01/02/2013  . Hypertension 12/02/2012  . Pulmonary nodule 12/02/2012  . Pulmonary fibrosis ([place]) 12/01/2012  . Normocytic anemia 11/07/2012  . S/P total hip arthroplasty 11/06/2012  . Degenerative  arthritis of hip 11/03/2012    Palliative Care Plan    Recommendations/Plan:  Family and medical team are in agreement to shift to a goal of comfort and liberate Ms. Cella from the BiPAP machine.  Interventions not aimed at comfort will be discontinued including BiPAP.  Medications to relieve air hunger and anxiety will be administered.  Goals of Care and Additional Recommendations:  Limitations on Scope of Treatment: Full Comfort Care  Code Status:  DNR  Prognosis:   Hours - Days   Discharge Planning:  Anticipated Hospital Death  Care plan was discussed with TRH MD, CCM MD and APP, RN, Family ([place] and [place])  Thank you for allowing the Palliative Medicine Team to assist in the care of this patient.  Total time spent:  35 min.     Greater than 50%  of this time was spent counseling and coordinating care related to the above assessment and plan.  Florentina Jenny, PA-C Palliative Medicine  Please contact Palliative MedicineTeam phone at [phone] for questions and concerns between 7 am - 7 pm.   Please see AMION for individual provider pager numbers.

## 2019-12-01 DEATH — deceased

## 2019-12-15 ENCOUNTER — Ambulatory Visit: Payer: Medicare Other | Admitting: Adult Health

## 2020-01-01 DEATH — deceased
